# Patient Record
Sex: Female | Born: 1940 | Race: White | Hispanic: No | Smoking: Former smoker
Health system: Southern US, Community
[De-identification: ages and names within clinical notes are randomized; demographics above are authoritative.]

## PROBLEM LIST (undated history)

## (undated) DIAGNOSIS — M545 Other chronic pain: Secondary | ICD-10-CM

## (undated) DIAGNOSIS — E039 Hypothyroidism, unspecified: Secondary | ICD-10-CM

## (undated) DIAGNOSIS — M479 Spondylosis, unspecified: Secondary | ICD-10-CM

## (undated) DIAGNOSIS — M5090 Cervical disc disorder, unspecified, unspecified cervical region: Secondary | ICD-10-CM

## (undated) DIAGNOSIS — I2510 Atherosclerotic heart disease of native coronary artery without angina pectoris: Secondary | ICD-10-CM

## (undated) DIAGNOSIS — Z8673 Personal history of transient ischemic attack (TIA), and cerebral infarction without residual deficits: Secondary | ICD-10-CM

## (undated) DIAGNOSIS — E785 Hyperlipidemia, unspecified: Secondary | ICD-10-CM

## (undated) DIAGNOSIS — I739 Peripheral vascular disease, unspecified: Secondary | ICD-10-CM

## (undated) DIAGNOSIS — M47815 Spondylosis without myelopathy or radiculopathy, thoracolumbar region: Secondary | ICD-10-CM

## (undated) DIAGNOSIS — J449 Chronic obstructive pulmonary disease, unspecified: Secondary | ICD-10-CM

## (undated) DIAGNOSIS — N189 Chronic kidney disease, unspecified: Secondary | ICD-10-CM

## (undated) DIAGNOSIS — Z951 Presence of aortocoronary bypass graft: Secondary | ICD-10-CM

## (undated) DIAGNOSIS — I10 Essential (primary) hypertension: Secondary | ICD-10-CM

## (undated) DIAGNOSIS — I272 Pulmonary hypertension, unspecified: Secondary | ICD-10-CM

## (undated) DIAGNOSIS — G459 Transient cerebral ischemic attack, unspecified: Secondary | ICD-10-CM

## (undated) DIAGNOSIS — G8929 Other chronic pain: Secondary | ICD-10-CM

## (undated) DIAGNOSIS — I255 Ischemic cardiomyopathy: Secondary | ICD-10-CM

## (undated) DIAGNOSIS — Z8701 Personal history of pneumonia (recurrent): Secondary | ICD-10-CM

## (undated) DIAGNOSIS — I639 Cerebral infarction, unspecified: Secondary | ICD-10-CM

## (undated) DIAGNOSIS — F419 Anxiety disorder, unspecified: Secondary | ICD-10-CM

## (undated) DIAGNOSIS — I4819 Other persistent atrial fibrillation: Secondary | ICD-10-CM

## (undated) HISTORY — DX: Atherosclerotic heart disease of native coronary artery without angina pectoris: I25.10

## (undated) HISTORY — DX: Personal history of pneumonia (recurrent): Z87.01

## (undated) HISTORY — DX: Spondylosis without myelopathy or radiculopathy, thoracolumbar region: M47.815

## (undated) HISTORY — DX: Hyperlipidemia, unspecified: E78.5

## (undated) HISTORY — DX: Cervical disc disorder, unspecified, unspecified cervical region: M50.90

## (undated) HISTORY — DX: Presence of aortocoronary bypass graft: Z95.1

## (undated) HISTORY — DX: Ischemic cardiomyopathy: I25.5

## (undated) HISTORY — DX: Other persistent atrial fibrillation: I48.19

## (undated) HISTORY — DX: Peripheral vascular disease, unspecified: I73.9

## (undated) HISTORY — DX: Pulmonary hypertension, unspecified: I27.20

## (undated) HISTORY — DX: Hypothyroidism, unspecified: E03.9

## (undated) HISTORY — DX: Essential (primary) hypertension: I10

## (undated) HISTORY — DX: Personal history of transient ischemic attack (TIA), and cerebral infarction without residual deficits: Z86.73

## (undated) HISTORY — DX: Cerebral infarction, unspecified: I63.9

## (undated) HISTORY — DX: Low back pain: M54.5

## (undated) HISTORY — PX: ABDOMINAL HYSTERECTOMY: SHX81

## (undated) HISTORY — DX: Spondylosis, unspecified: M47.9

## (undated) HISTORY — DX: Other chronic pain: M54.50

## (undated) HISTORY — DX: Transient cerebral ischemic attack, unspecified: G45.9

## (undated) HISTORY — DX: Chronic obstructive pulmonary disease, unspecified: J44.9

## (undated) HISTORY — DX: Other chronic pain: G89.29

---

## 2003-05-28 ENCOUNTER — Encounter: Admission: RE | Admit: 2003-05-28 | Discharge: 2003-05-28 | Payer: Self-pay | Admitting: Internal Medicine

## 2003-07-02 ENCOUNTER — Encounter: Admission: RE | Admit: 2003-07-02 | Discharge: 2003-07-02 | Payer: Self-pay | Admitting: Internal Medicine

## 2003-08-28 ENCOUNTER — Encounter: Admission: RE | Admit: 2003-08-28 | Discharge: 2003-08-28 | Payer: Self-pay | Admitting: Internal Medicine

## 2004-01-20 ENCOUNTER — Ambulatory Visit: Payer: Self-pay | Admitting: Internal Medicine

## 2004-01-28 ENCOUNTER — Ambulatory Visit: Payer: Self-pay | Admitting: Internal Medicine

## 2004-02-04 ENCOUNTER — Ambulatory Visit: Payer: Self-pay | Admitting: Internal Medicine

## 2004-02-22 ENCOUNTER — Ambulatory Visit: Payer: Self-pay | Admitting: Internal Medicine

## 2004-03-07 ENCOUNTER — Ambulatory Visit: Payer: Self-pay | Admitting: Internal Medicine

## 2004-07-21 ENCOUNTER — Ambulatory Visit: Payer: Self-pay | Admitting: Internal Medicine

## 2004-08-01 ENCOUNTER — Ambulatory Visit: Payer: Self-pay | Admitting: Internal Medicine

## 2004-10-17 ENCOUNTER — Ambulatory Visit: Payer: Self-pay | Admitting: Internal Medicine

## 2004-10-24 ENCOUNTER — Ambulatory Visit: Payer: Self-pay | Admitting: Internal Medicine

## 2005-03-21 ENCOUNTER — Ambulatory Visit (HOSPITAL_COMMUNITY): Admission: RE | Admit: 2005-03-21 | Discharge: 2005-03-21 | Payer: Self-pay | Admitting: Cardiovascular Disease

## 2005-04-03 DIAGNOSIS — I739 Peripheral vascular disease, unspecified: Secondary | ICD-10-CM

## 2005-04-03 HISTORY — DX: Peripheral vascular disease, unspecified: I73.9

## 2005-04-06 ENCOUNTER — Ambulatory Visit (HOSPITAL_COMMUNITY): Admission: RE | Admit: 2005-04-06 | Discharge: 2005-04-07 | Payer: Self-pay | Admitting: Cardiovascular Disease

## 2005-04-06 HISTORY — PX: ILIAC ARTERY STENT: SHX1786

## 2007-04-04 DIAGNOSIS — I2510 Atherosclerotic heart disease of native coronary artery without angina pectoris: Secondary | ICD-10-CM

## 2007-04-04 DIAGNOSIS — Z951 Presence of aortocoronary bypass graft: Secondary | ICD-10-CM

## 2007-04-04 HISTORY — PX: CORONARY ARTERY BYPASS GRAFT: SHX141

## 2007-04-04 HISTORY — PX: MAZE: SHX5063

## 2007-04-04 HISTORY — DX: Atherosclerotic heart disease of native coronary artery without angina pectoris: I25.10

## 2007-04-04 HISTORY — DX: Presence of aortocoronary bypass graft: Z95.1

## 2007-07-27 ENCOUNTER — Inpatient Hospital Stay (HOSPITAL_COMMUNITY): Admission: EM | Admit: 2007-07-27 | Discharge: 2007-08-11 | Payer: Self-pay | Admitting: Emergency Medicine

## 2007-07-30 ENCOUNTER — Encounter: Payer: Self-pay | Admitting: Cardiothoracic Surgery

## 2007-07-30 ENCOUNTER — Encounter (INDEPENDENT_AMBULATORY_CARE_PROVIDER_SITE_OTHER): Payer: Self-pay | Admitting: *Deleted

## 2007-07-30 ENCOUNTER — Ambulatory Visit: Payer: Self-pay | Admitting: Cardiothoracic Surgery

## 2007-07-30 HISTORY — PX: CARDIAC CATHETERIZATION: SHX172

## 2007-09-09 ENCOUNTER — Ambulatory Visit: Payer: Self-pay | Admitting: Cardiothoracic Surgery

## 2007-09-09 ENCOUNTER — Encounter
Admission: RE | Admit: 2007-09-09 | Discharge: 2007-09-09 | Payer: Self-pay | Admitting: Thoracic Surgery (Cardiothoracic Vascular Surgery)

## 2007-10-10 ENCOUNTER — Encounter: Admission: RE | Admit: 2007-10-10 | Discharge: 2007-10-10 | Payer: Self-pay | Admitting: Cardiothoracic Surgery

## 2007-10-10 ENCOUNTER — Ambulatory Visit: Payer: Self-pay | Admitting: Cardiothoracic Surgery

## 2010-03-25 ENCOUNTER — Inpatient Hospital Stay (HOSPITAL_COMMUNITY)
Admission: EM | Admit: 2010-03-25 | Discharge: 2010-03-26 | Payer: Self-pay | Source: Home / Self Care | Attending: Cardiovascular Disease | Admitting: Cardiovascular Disease

## 2010-04-03 DIAGNOSIS — I255 Ischemic cardiomyopathy: Secondary | ICD-10-CM

## 2010-04-03 HISTORY — DX: Ischemic cardiomyopathy: I25.5

## 2010-04-03 HISTORY — PX: TRANSTHORACIC ECHOCARDIOGRAM: SHX275

## 2010-04-16 ENCOUNTER — Inpatient Hospital Stay (HOSPITAL_COMMUNITY)
Admission: EM | Admit: 2010-04-16 | Discharge: 2010-04-23 | Payer: Self-pay | Source: Home / Self Care | Attending: Cardiovascular Disease | Admitting: Cardiovascular Disease

## 2010-04-17 ENCOUNTER — Encounter (INDEPENDENT_AMBULATORY_CARE_PROVIDER_SITE_OTHER): Payer: Self-pay | Admitting: Cardiovascular Disease

## 2010-04-18 LAB — URINE MICROSCOPIC-ADD ON

## 2010-04-18 LAB — CK TOTAL AND CKMB (NOT AT ARMC)
CK, MB: 1.5 ng/mL (ref 0.3–4.0)
Relative Index: INVALID (ref 0.0–2.5)
Total CK: 40 U/L (ref 7–177)

## 2010-04-18 LAB — URINALYSIS, ROUTINE W REFLEX MICROSCOPIC
Bilirubin Urine: NEGATIVE
Hgb urine dipstick: NEGATIVE
Ketones, ur: NEGATIVE mg/dL
Leukocytes, UA: NEGATIVE
Nitrite: NEGATIVE
Protein, ur: 100 mg/dL — AB
Specific Gravity, Urine: 1.013 (ref 1.005–1.030)
Urine Glucose, Fasting: NEGATIVE mg/dL
Urobilinogen, UA: 1 mg/dL (ref 0.0–1.0)
pH: 6.5 (ref 5.0–8.0)

## 2010-04-18 LAB — CBC
HCT: 40.7 % (ref 36.0–46.0)
HCT: 34.7 % — ABNORMAL LOW (ref 36.0–46.0)
Hemoglobin: 12.8 g/dL (ref 12.0–15.0)
Hemoglobin: 10.8 g/dL — ABNORMAL LOW (ref 12.0–15.0)
MCH: 28.1 pg (ref 26.0–34.0)
MCH: 28.3 pg (ref 26.0–34.0)
MCHC: 31.4 g/dL (ref 30.0–36.0)
MCHC: 31.1 g/dL (ref 30.0–36.0)
MCV: 89.5 fL (ref 78.0–100.0)
MCV: 90.8 fL (ref 78.0–100.0)
Platelets: 246 10*3/uL (ref 150–400)
Platelets: 170 10*3/uL (ref 150–400)
RBC: 4.55 MIL/uL (ref 3.87–5.11)
RBC: 3.82 MIL/uL — ABNORMAL LOW (ref 3.87–5.11)
RDW: 17.3 % — ABNORMAL HIGH (ref 11.5–15.5)
RDW: 17.5 % — ABNORMAL HIGH (ref 11.5–15.5)
WBC: 6.1 10*3/uL (ref 4.0–10.5)
WBC: 4.3 10*3/uL (ref 4.0–10.5)

## 2010-04-18 LAB — BASIC METABOLIC PANEL
BUN: 11 mg/dL (ref 6–23)
CO2: 26 mEq/L (ref 19–32)
Calcium: 7.5 mg/dL — ABNORMAL LOW (ref 8.4–10.5)
Chloride: 104 mEq/L (ref 96–112)
Creatinine, Ser: 0.81 mg/dL (ref 0.4–1.2)
GFR calc Af Amer: >60 mL/min (ref >60)
GFR calc non Af Amer: >60 mL/min (ref >60)
Glucose, Bld: 128 mg/dL — ABNORMAL HIGH (ref 70–99)
Potassium: 4 mEq/L (ref 3.5–5.1)
Sodium: 138 mEq/L (ref 135–145)

## 2010-04-18 LAB — DIFFERENTIAL
Basophils Absolute: 0 10*3/uL (ref 0.0–0.1)
Basophils Relative: 0 % (ref 0–1)
Eosinophils Absolute: 0 10*3/uL (ref 0.0–0.7)
Eosinophils Relative: 0 % (ref 0–5)
Lymphocytes Relative: 12 % (ref 12–46)
Lymphs Abs: 0.7 10*3/uL (ref 0.7–4.0)
Monocytes Absolute: 0.6 10*3/uL (ref 0.1–1.0)
Monocytes Relative: 10 % (ref 3–12)
Neutro Abs: 4.7 10*3/uL (ref 1.7–7.7)
Neutrophils Relative %: 78 % — ABNORMAL HIGH (ref 43–77)

## 2010-04-18 LAB — COMPREHENSIVE METABOLIC PANEL
ALT: 26 U/L (ref 0–35)
AST: 46 U/L — ABNORMAL HIGH (ref 0–37)
Albumin: 3.9 g/dL (ref 3.5–5.2)
Alkaline Phosphatase: 75 U/L (ref 39–117)
BUN: 14 mg/dL (ref 6–23)
CO2: 29 mEq/L (ref 19–32)
Calcium: 8.6 mg/dL (ref 8.4–10.5)
Chloride: 94 mEq/L — ABNORMAL LOW (ref 96–112)
Creatinine, Ser: 0.97 mg/dL (ref 0.4–1.2)
GFR calc Af Amer: >60 mL/min (ref >60)
GFR calc non Af Amer: 57 mL/min — ABNORMAL LOW (ref >60)
Glucose, Bld: 219 mg/dL — ABNORMAL HIGH (ref 70–99)
Potassium: 4.6 mEq/L (ref 3.5–5.1)
Sodium: 134 mEq/L — ABNORMAL LOW (ref 135–145)
Total Bilirubin: 1.6 mg/dL — ABNORMAL HIGH (ref 0.3–1.2)
Total Protein: 7.1 g/dL (ref 6.0–8.3)

## 2010-04-18 LAB — TROPONIN I: Troponin I: <0.01 ng/mL (ref 0.00–0.06)

## 2010-04-18 LAB — APTT: aPTT: 74 seconds — ABNORMAL HIGH (ref 24–37)

## 2010-04-18 LAB — BRAIN NATRIURETIC PEPTIDE: Pro B Natriuretic peptide (BNP): 388 pg/mL — ABNORMAL HIGH (ref 0.0–100.0)

## 2010-04-18 LAB — PROTIME-INR
INR: 1.88 — ABNORMAL HIGH (ref 0.00–1.49)
Prothrombin Time: 21.8 seconds — ABNORMAL HIGH (ref 11.6–15.2)

## 2010-04-18 LAB — RAPID STREP SCREEN (MED CTR MEBANE ONLY): Streptococcus, Group A Screen (Direct): NEGATIVE

## 2010-04-18 LAB — TSH: TSH: 1.289 u[IU]/mL (ref 0.350–4.500)

## 2010-04-18 LAB — MRSA PCR SCREENING: MRSA by PCR: NEGATIVE

## 2010-04-20 LAB — BASIC METABOLIC PANEL
BUN: 8 mg/dL (ref 6–23)
BUN: 5 mg/dL — ABNORMAL LOW (ref 6–23)
CO2: 25 mEq/L (ref 19–32)
CO2: 33 mEq/L — ABNORMAL HIGH (ref 19–32)
Calcium: 7.5 mg/dL — ABNORMAL LOW (ref 8.4–10.5)
Calcium: 7.7 mg/dL — ABNORMAL LOW (ref 8.4–10.5)
Chloride: 100 mEq/L (ref 96–112)
Chloride: 96 mEq/L (ref 96–112)
Creatinine, Ser: 0.6 mg/dL (ref 0.4–1.2)
Creatinine, Ser: 0.74 mg/dL (ref 0.4–1.2)
GFR calc Af Amer: >60 mL/min (ref >60)
GFR calc Af Amer: >60 mL/min (ref >60)
GFR calc non Af Amer: >60 mL/min (ref >60)
GFR calc non Af Amer: >60 mL/min (ref >60)
Glucose, Bld: 106 mg/dL — ABNORMAL HIGH (ref 70–99)
Glucose, Bld: 120 mg/dL — ABNORMAL HIGH (ref 70–99)
Potassium: 3.8 mEq/L (ref 3.5–5.1)
Potassium: 3 mEq/L — ABNORMAL LOW (ref 3.5–5.1)
Sodium: 134 mEq/L — ABNORMAL LOW (ref 135–145)
Sodium: 137 mEq/L (ref 135–145)

## 2010-04-20 LAB — CBC
HCT: 34.3 % — ABNORMAL LOW (ref 36.0–46.0)
HCT: 33.7 % — ABNORMAL LOW (ref 36.0–46.0)
Hemoglobin: 10.6 g/dL — ABNORMAL LOW (ref 12.0–15.0)
Hemoglobin: 10.8 g/dL — ABNORMAL LOW (ref 12.0–15.0)
MCH: 28 pg (ref 26.0–34.0)
MCH: 28.5 pg (ref 26.0–34.0)
MCHC: 30.9 g/dL (ref 30.0–36.0)
MCHC: 32 g/dL (ref 30.0–36.0)
MCV: 90.5 fL (ref 78.0–100.0)
MCV: 88.9 fL (ref 78.0–100.0)
Platelets: 157 10*3/uL (ref 150–400)
Platelets: 172 10*3/uL (ref 150–400)
RBC: 3.79 MIL/uL — ABNORMAL LOW (ref 3.87–5.11)
RBC: 3.79 MIL/uL — ABNORMAL LOW (ref 3.87–5.11)
RDW: 17.3 % — ABNORMAL HIGH (ref 11.5–15.5)
RDW: 16.7 % — ABNORMAL HIGH (ref 11.5–15.5)
WBC: 4 10*3/uL (ref 4.0–10.5)
WBC: 4.8 10*3/uL (ref 4.0–10.5)

## 2010-04-20 LAB — MAGNESIUM: Magnesium: 2.1 mg/dL (ref 1.5–2.5)

## 2010-04-20 LAB — HEMOCCULT GUIAC POC 1CARD (OFFICE): Fecal Occult Bld: POSITIVE

## 2010-04-20 LAB — CLOSTRIDIUM DIFFICILE BY PCR: Toxigenic C. Difficile by PCR: NEGATIVE

## 2010-04-20 LAB — BRAIN NATRIURETIC PEPTIDE: Pro B Natriuretic peptide (BNP): 390 pg/mL — ABNORMAL HIGH (ref 0.0–100.0)

## 2010-04-24 ENCOUNTER — Encounter: Payer: Self-pay | Admitting: Cardiothoracic Surgery

## 2010-04-25 LAB — BASIC METABOLIC PANEL
Creatinine, Ser: 0.83 mg/dL (ref 0.4–1.2)
Potassium: 3.7 mEq/L (ref 3.5–5.1)

## 2010-04-25 LAB — CBC
HCT: 36.8 % (ref 36.0–46.0)
MCH: 28 pg (ref 26.0–34.0)

## 2010-04-25 LAB — TSH: TSH: 2.984 u[IU]/mL (ref 0.350–4.500)

## 2010-04-28 NOTE — Discharge Summary (Addendum)
NAMEMICKI, CASSEL NO.:  [id number]  MEDICAL RECORD NO.:  [id number]          PATIENT TYPE:  INP  LOCATION:  2922                         FACILITY:  [facility]  PHYSICIAN:  Landry Corporal, MD DATE OF BIRTH:  01-04-1941  DATE OF ADMISSION:  04/16/2010 DATE OF DISCHARGE:  04/23/2010                              DISCHARGE SUMMARY   DISCHARGE DIAGNOSES: 1. Atrial fibrillation with rapid ventricular response, rate     controlled at discharge. 2. Pneumonia.  Today is the last day of her antibiotic treatment. 3. Hypertension, currently stable. 4. Coronary artery disease. 5. Negative CT angiogram of chest for pulmonary embolism.  HOSPITAL COURSE:  [name] is a 70 year old female with a history of coronary artery disease status post bypass grafting x2 in May 2009 with Maze procedure.  She was hospitalized just before Christmas with atrial fibrillation.  She was sent home on Pradaxa and rate control.  She came in this admission for a complaint of weakness.  She has been treated for 2 weeks prior to the admission for an outpatient pneumonia with some antibiotics.  She had a 6-day course of those antibiotics which apparently did not help.  In the emergency room, her heart rate was in the 110-140 range.  She was hypotensive with blood pressure in the 70s that was after almost a liter of normal saline.  The patient was admitted.  She was continued on fluid replacement.  A 2-D echocardiogram was ordered.  She was started on IV amiodarone for rate control.  Her initial cardiac enzymes were negative.  Chest x-ray showed hyperexpansion and chronic changes.  EKG confirmed atrial fibrillation with rapid ventricular response.  She was on Pradaxa for anticoagulation with low suspicion for sepsis or cardiogenic shock.  On January 15, the patient had slightly improved appetite, still coughing and wheezing, blood pressures improved, and her atrial fibrillation was close to  being rate controlled at about 104.  She was treated empirically for pneumonia with Levaquin 500 mg p.o. daily.  There was consideration made for cardioversion and at this current time, we are at this point in her stay, her IV fluids were decreased, her amiodarone was increased, and she was added on digoxin.  A 2-D echocardiogram was completed showed an ejection fraction of 40-45%.  On January 16, the patient was dyspneic and wheezing sounding wet.  Our main concerns were pulmonary embolism. The patient was subsequently sent for a CT angiogram of chest to rule out PE.  On January 17, the patient continued to complain of weakness stating she did not feel well.  The patient had been previously started on Rocephin also and it was discontinued on January 17.  On January 18, the patient was slow in atrial fibrillation, rate was much better.  She was 89 beats per minute and she was stating she feels better as well. BNP was slightly increased at 402.  She was fecal occult blood positive and C. diff negative.  Incentive spirometry was ordered to improve her pulmonary function.  The patient's heart rate seemed to continue to fluctuate.  On January 19, it was  115 beats per minute.  CT angiogram of chest was completed on January 19 and was negative for pulmonary embolism, was positive for cardiomegaly and small bilateral pleural effusions.  There was moderate central lobular emphysema with patchy lower lobe predominant volume loss/atelectasis.  On January 20, the patient was doing very well, using her incentive spirometry, rate was controlled down to 78, good blood pressure in the 90s systolic.  She is to continue on the amiodarone 400 mg daily for 1 week and then switched to 200 mg.  She had been seen by Dr. Herbie Baltimore on the day of discharge, feels she is stable for discharge home.  She was ambulated in the hall prior to this decision to make sure heart rate stayed stable.  LABORATORY DATA:  CBC:   WBCs were 6.7, hemoglobin 11.7, hematocrit 36.8, and platelets 200.  Sodium 138, potassium 3.7, chloride 91, CO2 of 36, BUN 3, creatinine 0.3, and calcium 8.2.  BNP was 402.  Thyroid stimulating hormone was 2.984.  STUDIES/PROCEDURES: 1. Chest x-ray, the latest one was January 16 showed new small     bilateral pleural effusions, new mild interstitial pulmonary edema     as bases. 2. CT angiogram on January 19, impression was no evidence of pulmonary     embolism.  Cardiomegaly and small bilateral pleural effusions     present.  Question congestive heart failure.  There is moderate     central lobular emphysema with patchy lower lobe predominant volume     loss/atelectasis.  Focal right middle lobe atelectasis with     slightly progressive from July 27, 2007, without underlying mass     or endobrachial obstruction. 3. Echocardiogram, January 15, study conclusions:  Left ventricle     systolic function was mildly to moderately reduced.  Estimated EF     in the range of 40-45%.  Diffuse hypokinesis.  Atrial fibrillation     precludes evaluation of LV diastolic dysfunction.  Mitral valve     showed mild regurgitation.  Left atrium was mildly dilated.  Right     ventricle cavity size was mildly dilated.  Systolic function was     mildly reduced.  Right atrium was mildly dilated.  Atrial septum     had no defect or patent foramen ovale identified.  Tricuspid valves     revealed moderate regurgitation.  Pulmonary artery systolic peak PA     pressure was 48 mmHg.  DISCHARGE MEDICATIONS: 1. Amiodarone 200 mg tablets 2 tablets once a day for the first 7 days     on discharge and then subsequently changing to 1 tablet a day from     thereon after. 2. Digoxin 0.125 mg tablets 1 tablet by mouth daily. 3. Diltiazem 120 mg CD ER 1 tablet by mouth every 12 hours.  The     patient is to alternate this or to separate taking the metoprolol     with the diltiazem by a period of 2 hours.  If her heart  rate is     less than 60 beats per minute, she is not to take her diltiazem.     This was explained to the patient. 4. Nitroglycerin sublingual 0.4 mg 1 tablet by mouth every 5 minutes     as needed for chest pain, not up to take more than 3 doses. 5. Pantoprazole 40 mg enteric-coated tablets 1 tablet by mouth daily. 6. Alprazolam 0.5 mg 1 tablet by mouth daily at bedtime. 7. Aspirin 81 mg  1 tablet by mouth daily. 8. Dabigatran 150 mg capsules 1 capsule by mouth twice daily. 9. Lasix 40 mg 1 tablet by mouth daily. 10.Lipitor 80 mg 1 tablet by mouth daily at bedtime. 11.Lisinopril 2.5 mg 1 tablet by mouth daily. 12.Metoprolol 25 mg tablets 2 tablets by mouth twice a day, she takes     this med at 8 o'clock in the morning and 8 p.m. which means she     will be taking the diltiazem 2 hours before or 2 hours after. 13 Potassium chloride 20 mEq 1 tablet by mouth daily at bedtime.  DISPOSITION:  [name] will be discharged home in stable condition.  She will be increasing her activity slowly.  She can shower and bathe and all those fine activities.  She is to eat a low-sodium, heart-healthy diet.  She will follow with Dr. Allyson Sabal in approximately 2-3 weeks.  Our office will call her with the appointment time.  ______________________________ Wilburt Finlay, PA  I saw and examined the patient on the day of discharge.  She was stable for discharge.  I agree with Mr. Jasper Riling summary of events.  ______________________________ Landry Corporal, MD    BH/MEDQ  D:  04/23/2010  T:  04/24/2010  Job:  [id number]  cc:   Nanetta Batty, M.D.  Electronically Signed by Wilburt Finlay PA on 04/26/2010 12:26:10 PM Electronically Signed by Bryan Lemma MD on 04/28/2010 11:25:45 PM

## 2010-05-13 NOTE — H&P (Signed)
NAMECECELIA, [name]                 ACCOUNT NO.:  [id number]  MEDICAL RECORD NO.:  [id number]          PATIENT TYPE:  EMS  LOCATION:  MAJO                         FACILITY:  [facility]  PHYSICIAN:  Dr. Allyson Sabal              DATE OF BIRTH:  04-Dec-1940  DATE OF ADMISSION:  04/16/2010 DATE OF DISCHARGE:                             HISTORY & PHYSICAL   CHIEF COMPLAINT:  Weakness.  HISTORY OF PRESENT ILLNESS:  [name] is a 70 year old female, who had bypass grafting x2 in May 2009 with a Maze procedure.  She was just hospitalized over Christmas with atrial fibrillation.  She was sent home on Pradaxa and rate control.  I see no mention of a recent ejection fraction, it is possible she had an outpatient echocardiogram recently. She is admitted now through the emergency room with weakness.  She says about 2 weeks ago, she was treated with antibiotics for "outpatient pneumonia."  She had a 6-day course, which apparently did not help and the daughter called the office earlier this week and she was put on another course of antibiotics.  In the emergency room, the patient is in atrial fibrillation with a rate of 110-140.  She is hypotensive with a blood pressure in the 70s; despite almost a liter of normal saline.  She denies any shortness of breath.  MEDICATIONS:  Her home medications are; 1. Cartia 120 mg a day. 2. Pradaxa 150 mg b.i.d. 3. Potassium 20 mEq a day. 4. Metoprolol 75 mg b.i.d. 5. Lipitor 80 mg a day. 6. Ceftin 500 mg b.i.d. 7. Lasix 40 mg a day. 8. Lisinopril 2.5 mg a day.  ALLERGIES:  She has no known drug allergies.  PAST MEDICAL HISTORY:  Remarkable for; 1. Vascular disease, she has had previous left iliac and superficial     femoral artery PTAs. 2. She has history of hypertension and dyslipidemia.  SOCIAL HISTORY:  She lives alone; although her daughters in the area. She is a remote smoker.  FAMILY HISTORY:  Unremarkable.  REVIEW OF SYSTEMS:  Essentially  unremarkable; except for noted above.  PHYSICAL EXAMINATION:  VITAL SIGNS:  Blood pressure currently is 74/50, heart rate is 120, and O2 sats 96%. GENERAL:  She is a chronically ill-appearing, frail 70 year old female, in no acute distress. HEENT:  Normocephalic.  She wears glasses. NECK:  Without JVD or bruit. CHEST:  Diminished breath sounds at the left base, but otherwise clear lung fields. CARDIAC:  Diminished heart sounds with irregularly irregular rhythm.  No obvious murmur or rub. ABDOMEN:  Nondistended and nontender. EXTREMITIES:  Without edema. NEUROLOGIC:  Grossly intact.  She is awake, alert, oriented, and cooperative.  Moves all extremities without obvious deficit. SKIN:  Cool and dry.  LABORATORY DATA:  Sodium 134, potassium 4.6, BUN 14, and creatinine 0.97.  White count 6.1, hemoglobin 12.8, hematocrit 40.7, and platelets 246.  Troponin is negative.  Chest x-ray shows hyperexpansion and chronic changes.  EKG shows atrial fibrillation with increased ventricular response and narrow QRS.  IMPRESSION: 1. Atrial fibrillation with rapid ventricular response, this does not  appear to be fast enough to account for her hypotension. 2. Hypotension, possibly secondary to combination of pneumonia and     atrial fibrillation, her labs did not indicate that she is     significantly dehydrated. 3. Questionable outpatient pneumonia, on antibiotics. 4. Atrial fibrillation, which has been recurrent, she was admitted     over Christmas for this. 5. Coronary artery disease with coronary artery bypass grafting x2     with a Maze procedure in May 2009. 6. Vascular disease with previous left iliac and bifemoral PTA. 7. History of treated hypertension. 8. Treated dyslipidemia.  PLAN:  The patient will continue to get fluids.  She will need an echocardiogram or some assessment of her LV function, we will try and see if there is a echo from the office in the recent records.  She  does not appear to be septic, she is not febrile and her white count is 6.1, we will continue her p.o. antibiotics for now.  We will try IV amiodarone for rate control.     Abelino Derrick, P.A.   ______________________________ Dr. Evalee Mutton  D:  04/16/2010  T:  04/16/2010  Job:  [id number]  cc:   Tammy R. Collins Scotland, M.D.  Electronically Signed by Corine Shelter P.A. on 04/17/2010 11:14:05 AM Electronically Signed by Nanetta Batty M.D. on 05/13/2010 08:07:59 AM

## 2010-06-13 LAB — CBC
HCT: 39.4 % (ref 36.0–46.0)
Hemoglobin: 12.7 g/dL (ref 12.0–15.0)
Hemoglobin: 13.5 g/dL (ref 12.0–15.0)
MCH: 29.5 pg (ref 26.0–34.0)
MCH: 29.8 pg (ref 26.0–34.0)
MCHC: 32.2 g/dL (ref 30.0–36.0)
MCV: 90.5 fL (ref 78.0–100.0)
Platelets: 216 10*3/uL (ref 150–400)
RDW: 15.8 % — ABNORMAL HIGH (ref 11.5–15.5)
RDW: 15.8 % — ABNORMAL HIGH (ref 11.5–15.5)
WBC: 8.7 10*3/uL (ref 4.0–10.5)

## 2010-06-13 LAB — CK TOTAL AND CKMB (NOT AT ARMC)
CK, MB: 1.2 ng/mL (ref 0.3–4.0)
Total CK: 58 U/L (ref 7–177)

## 2010-06-13 LAB — POCT CARDIAC MARKERS: Troponin i, poc: <0.05 ng/mL (ref 0.00–0.09)

## 2010-06-13 LAB — POCT I-STAT, CHEM 8
BUN: 18 mg/dL (ref 6–23)
Calcium, Ion: 1.03 mmol/L — ABNORMAL LOW (ref 1.12–1.32)
Chloride: 102 mEq/L (ref 96–112)
HCT: 41 % (ref 36.0–46.0)
Sodium: 139 mEq/L (ref 135–145)
TCO2: 28 mmol/L (ref 0–100)

## 2010-06-13 LAB — TROPONIN I: Troponin I: 0.06 ng/mL (ref 0.00–0.06)

## 2010-06-13 LAB — COMPREHENSIVE METABOLIC PANEL
AST: 32 U/L (ref 0–37)
Alkaline Phosphatase: 69 U/L (ref 39–117)
BUN: 12 mg/dL (ref 6–23)
CO2: 28 mEq/L (ref 19–32)
Chloride: 106 mEq/L (ref 96–112)
Creatinine, Ser: 0.67 mg/dL (ref 0.4–1.2)
GFR calc non Af Amer: >60 mL/min (ref >60)
Potassium: 3.4 mEq/L — ABNORMAL LOW (ref 3.5–5.1)
Total Bilirubin: 1.2 mg/dL (ref 0.3–1.2)

## 2010-06-13 LAB — BRAIN NATRIURETIC PEPTIDE: Pro B Natriuretic peptide (BNP): 439 pg/mL — ABNORMAL HIGH (ref 0.0–100.0)

## 2010-06-13 LAB — TSH: TSH: 3.047 u[IU]/mL (ref 0.350–4.500)

## 2010-06-13 LAB — PROTIME-INR: INR: 1.43 (ref 0.00–1.49)

## 2010-06-13 LAB — CARDIAC PANEL(CRET KIN+CKTOT+MB+TROPI): Troponin I: 0.04 ng/mL (ref 0.00–0.06)

## 2010-08-16 NOTE — Assessment & Plan Note (Signed)
OFFICE VISIT   [name], [name]  DOB:  23-Dec-1940                                        October 10, 2007  CHART #:  [id number]   The patient returns to the office today in followup after her discharge  from the hospital on Aug 11, 2007.  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  Early after discharge, she had confusion about the  management of her Coumadin and ended up with INR of over 7.  She is now  being managed by the [facility] and remains on  Coumadin 2.5 on Mondays and Thursdays and 5 mg the rest of the week.  Overall, she is making good progress.  She has not noted any episodes of  rapid heart rate that was her original presenting symptom at the time of  her admission.   On exam, her blood pressure is 115/70, pulse is 76 and regular with a  respiratory rate of 18, and O2 saturations 93%.  Her sternum is stable  and well healed.  Bowel sounds are crisp.  I do not appreciate any  mitral regurgitation.  She has no pedal edema.  The right endovein  harvest site is well healed.   Followup chest x-ray shows decreasing left effusion and improved  aeration on the left.  A rhythm strip shows sinus rhythm.   She is to have a stress test done in Dr. Hazle Coca office today.  She is  now about 2 months postop, and at least today appears to be in sinus  rhythm.  I have asked to discuss with Dr. Gery Pray stopping  her Coumadin in the near future if she remains in sinus rhythm.  I have  not made a return appointment to see me, but would be glad to see her at  her or Dr. Hazle Coca request at anytime.   Sheliah Plane, MD  Electronically Signed   EG/MEDQ  D:   10/10/2007  T:  10/10/2007  Job:  [id number]   cc:   Nanetta Batty, M.D.  Tammy R. Collins Scotland, M.D.

## 2010-08-16 NOTE — Assessment & Plan Note (Signed)
OFFICE VISIT   [name], [name]  DOB:  1940-05-13                                        September 09, 2007  CHART #:  [id number]   HISTORY:  [name] returns for an office visit on September 09, 2007,  following her coronary artery bypass grafting x2 and modified left-sided  maze procedure as well as ligation of the left atrial appendage and  ligation of an arterial venous fistula from the left coronary system to  the coronary sinus.  This procedure was performed on Aug 05, 2007, by Dr.  Tyrone Sage.  She reports on September 09, 2007, that she overall feels as though  she is making good progress.  She does describe some dyspnea on  exertion.  She denies fevers, chills, or other constitutional symptoms.  She is not having significant difficulty with chest pain.   CHEST X-RAY:  A chest x-ray was evaluated on September 09, 2007.  There is a  small-to-moderate sized left-sided effusion, which is very stable in  appearance compared to a previous film dated Aug 11, 2007.  The right-  sided effusion has improved.  Her sternal wires are intact.  There is no  evidence of other infiltrates or congestive failure.  There is some  atelectasis in the left lower base.   PHYSICAL EXAMINATION:  Vital Signs:  Blood pressure 112/66, pulse of 86,  and respirations 18.  Note:  A rhythm strip was done during this visit  and reveals a normal sinus rhythm.  General Appearance:  A well-  developed adult female in no acute distress.  Pulmonary Examination:  Reveals diminished breath sounds in the left base.  Otherwise, clear.  Cardiac Examination:  Reveals a regular rate and rhythm.  Normal S1 and  S2 without gallops, rubs, or murmurs.  Abdominal Examination:  Soft and  nontender.  Extremity Examination:  Reveals some mild edema right  greater than left lower extremity.  Incisions are all healed without  evidence of infection.   ASSESSMENT:  [name] is doing well.  I placed her on Lasix 40 mg daily  with  potassium chloride 20 mEq      daily for 1 month, at which time we  will repeat a chest x-ray and see her in  the office.  She is encouraged to continue her ongoing follow up with  her primary physician and cardiologist.   Rowe Clack, P.A.-C.   Sherryll Burger  D:  09/09/2007  T:  09/10/2007  Job:  [id number]   cc:   [name], M.D.

## 2010-08-16 NOTE — Cardiovascular Report (Signed)
Brenda, Glass NO.:  [id number]   MEDICAL RECORD NO.:  [id number]           PATIENT TYPE:   LOCATION:                                 FACILITY:   PHYSICIAN:  [name], M.D.DATE OF BIRTH:  07-Dec-1940   DATE OF PROCEDURE:  07/30/2007  DATE OF DISCHARGE:                            CARDIAC CATHETERIZATION   PROCEDURES PERFORMED:  1. Selective coronary angiography by Judkins technique.  2. Retrograde left heart catheterization.  3. Left ventricular angiography.  4. Abdominal aortography, suprarenal with bifemoral runoff.   INTERVENTIONS:  None.   COMPLICATIONS:  None.   PATIENT PROFILE:  Ms. [name] is a 69 year old white female who is a  cardiology patient of Dr. Nanetta Glass, who has had left flank pain  for the last 4 days with nausea.  She has fast atrial fibrillation,  which is a new diagnosis and an elevated D-dimer, and the patient also  has a history of hypertension and microhematuria.  Her cardiac enzymes  were elevated during hospitalization.  D-dimer was 3.31.  She had a  negative Myoview test in 2007, but her troponin was 0.07 and subsequent  troponin levels were 1.41 and 0.19.  The patient's EKG has shown atrial  fibrillation with a ventricular response as high as 150 with nonspecific  ST-T abnormalities.  The patient came to the cath lab today for further  evaluation of coronary artery status in view of her rhythm, her flank  pain, and her positive cardiac enzymes.  Today's procedure was performed  electively without any complications.   RESULTS:  Pressures:  The left ventricular pressure was 94/11, end-  diastolic pressure 14, and central aortic pressure was 98/67 with a mean  of 81.  There was a nonsignificant gradient across the aortic valve by  pullback technique.   ANGIOGRAPHIC RESULTS:  The patient's coronary arteries showed  calcification in the right coronary artery and along the first half of  the LAD and the ramus  intermediate branch.   There were trivial calcifications along the aortic root.   The left ventricle showed vigorous contractility of all wall segments  with ejection fraction estimate of 60-70%.  There was catheter-induced  mitral regurgitation and it was somewhere between mild to moderate and  echocardiography will be needed to further evaluate her mitral  regurgitation.   The patient's aortic root had calcifications in the aortic knob.   The left anterior descending coronary artery coursed the cardiac apex,  but it was a relatively small vessel.  There was a hypodense area in the  proximal LAD before the first septal perforator branch that was as much  as 75% narrowed visually.  There was also ostial stenosis in her septal  perforator branch, which was as large in circumference as her LAD, but  not quite as long   The intermediate ramus branch was a large vessel with only luminal  irregularities and did not have any high-grade stenosis.   The right coronary artery gave rise to posterior descending and  posterolateral branches, and there were scattered lesions throughout the  proximal and  mid RCA, which included an ostial RCA of 90%, mid RCA of  75%, and then another more distal stenosis of 90% in her RCA.  The  distal RCA appeared bypassable.   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   Left ventricular angiography showed vigorous contractility of all wall  segments.  There was 2 to 3+ mitral regurgitation that was catheter  induced.  I think that the patient will require echocardiography to  further evaluate mitral regurgitation.   The abdominal aorta showed single renal arteries both right and left  with wide patency of the arterial lumen.   The right common iliac was calcified, but not significantly obstructed.  There were 3 overlying stents in her left common  iliac that provided  good blood flow through those patent stents.   FINAL IMPRESSIONS:  1. Recent positive enzymes in a patient with atrial fibrillation and      flank pain.  2. Two-vessel coronary artery disease, 75% LAD and 3 stenoses of 75-      90% in her RCA.  3. 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  4. Normal ramus intermediate branch.  5. Normal LV systolic function.   PLAN:  The patient would appear to be a candidate for coronary artery  bypass grafting to her RCA and to her LAD and then there is also room  for discussion as to whether or not her ramus would need bypass grafting  as well.  We will discuss this with our cardiovascular surgeons.  The  patient will be maintained on medical therapy until that time.           ______________________________  [name], M.D.     WHG/MEDQ  D:  07/30/2007  T:  07/31/2007  Job:  [id number]   cc:   [name], M.D.

## 2010-08-16 NOTE — H&P (Signed)
NAMETRUDEE, CHIRINO NO.:  [id number]   MEDICAL RECORD NO.:  [id number]          PATIENT TYPE:  EMS   LOCATION:  MAJO                         FACILITY:  [facility]   PHYSICIAN:  Isidor Holts, M.D.  DATE OF BIRTH:  March 09, 1941   DATE OF ADMISSION:  07/27/2007  DATE OF DISCHARGE:                              HISTORY & PHYSICAL   PRIMARY MEDICAL DOCTOR:  Tammy R. Collins Scotland, M.D.   PRIMARY CARDIOLOGIST:  Dr. Nanetta Batty.   CHIEF COMPLAINT:  Left flank pain for 4 days, also nausea.   HISTORY OF PRESENT ILLNESS:  This is a 70 year old female.  For past  medical history, see below.  The patient is an excellent historian, and  supplied the history herself.  She states that since July 24, 2007, she  started experiencing left loin pain radiating around the side to the  front of her abdomen.  This was constant and unremitting.  She did not  utilize any over-the-counter medication, but remained in bed constantly,  did not eat very much, drank only a little fluid.  Denies fever or  chills.  Denies vomiting or diarrhea.  Denies dysuria or urinary  frequency.  She eventually went to see her primary MD, Dr. Herb Grays,  on July 27, 2007, were she was examined, found to have a tachycardia  and was referred to the emergency department for further evaluation.   PAST MEDICAL HISTORY:  1. Hypertension.  2. Dyslipidemia.  3. Peripheral vascular disease with bilateral proximal external iliac      artery stenosis, left more than right, status post angioplasty and      stent of left external iliac artery and left common femoral artery      on April 06, 2005 by Dr. Nanetta Batty.  4. Status post partial hysterectomy decades ago.   MEDICATION HISTORY:  1. Plavix 75 mg p.o. daily.  2. Zetia 10 mg p.o. daily.  3. Aspirin 81 mg p.o. daily.  4. Welchol (625 mg) 6 tablets p.o. daily.  The patient, however, is      not very compliant with this.   ALLERGIES:  NO KNOWN DRUG  ALLERGIES.   REVIEW OF SYSTEMS:  As per HPI and chief complaint, otherwise negative.   SOCIAL HISTORY:  The patient is divorced since about 20 years ago, has 2  offspring, a daughter who is age 56 years with multiple sclerosis and a  son who died of myocardial infarction at age 64 years.  She is an ex-  smoker.  Smoked for approximately 20 years but quit approximately 11  years ago.   FAMILY HISTORY:  The patient's father died at age 21 years status post  MI.  Her mother died following recurrent CVAs in her late 58s.  Family  history is otherwise noncontributory.   PHYSICAL EXAMINATION:  VITALS:  Temperature 99.9, pulse 150 per minute,  however, following intravenous administration of a 15 mg bolus of  Cardizem in the emergency department, dropped to 69 per minute.  Subsequently 116 per minute.  Respiratory rate 18, BP 119/78 mmHg, pulse  oximeter 98%  on room air.  The patient did not appear to be in obvious  acute distress at the time of this evaluation.  Alert, communicative,  not short of breath at rest.  HEENT:  No clinical pallor or jaundice.  No conjunctival injection.  Throat is clear.  NECK:  Supple.  JVP not seen.  No palpable lymphadenopathy.  No palpable  goiter.  CHEST:  Clinically clear to auscultation.  No wheezes or crackles.  HEART:  Sounds 1 and 2 heard, normal, irregularly irregular.  No  murmurs.  ABDOMEN:  Full, soft and nontender.  No palpable organomegaly.  No  palpable masses.  Normal bowel sounds.  There is no tenderness to renal  angle punch bilaterally.  LOWER EXTREMITY EXAMINATION:  No pitting edema.  Palpable peripheral  pulses, although the left lower extremity is somewhat cooler than the  right.  CENTRAL NERVOUS SYSTEM:  No focal neurologic deficits on gross  examination.  MUSCULOSKELETAL SYSTEM:  Not formally examined although osteoarthritic  changes are noted.   INVESTIGATIONS:  CBC:  WBC 17.2, hemoglobin 15.1, hematocrit 44.7,  platelets 304.   Electrolytes:  Sodium 132, potassium 3.6, chloride 100,  CO2 24, BUN 20, creatinine 1.1, glucose 138.  CBG 134.  D-dimer is  elevated at 3.31.  Urinalysis shows WBCs 0-2, RBC 7-10, bacteria rare.  Chest x-ray dated July 27, 2007 shows no acute cardiopulmonary  findings.  Abdominal/pelvic CT scan dated July 27, 2007 shows left  perinephric stranding without hydronephrosis or radiopaque calculus.  Colonic diverticula are noted.  No diverticulitis.  Uterus is surgically  absent.  There is levoscoliosis of the spine with diffuse degenerative  disk disease.  There were no acute intrapelvic findings.  A 12-lead EKG  dated July 27, 2007, showed atrial fibrillation with a rapid  ventricular response rate of 150 per minute.  Following administration  of intravenous Cardizem bolus in the emergency department, ventricular  rate dropped to 84 per minute.  Also, old Q-waves are noted in V1 and  V2.   ASSESSMENT AND PLAN:  1. Left loin pain with microhematuria.  Clinically this appears      consistent with renal colic.  The patient has likely passed a      stone, which is why it is not evident on abdominal CT scan.      Although the patient may indeed have a radiopaque stone, there is      no evidence of hydronephrosis.  We shall manage with intravenous      fluid hydration, particularly as the patient appears to be mildly      dehydrated, utilize p.r.n. analgesics, do renal ultrasound to rule      out a radiolucent stone.   1. Fast atrial fibrillation.  This is a new diagnosis.  We shall      manage with intravenous Cardizem infusion.  The patient has had      partial response to an intravenous bolus of Cardizem administered      by the emergency room physician.  Otherwise we shall manage per      cardiology recommendations.  The patient has already been consulted      by Dr. Yates Decamp, [facility].  Meanwhile, we      shall cycle cardiac enzymes and do a TSH.   1. Elevated  D-dimer.  We, of course, need to rule out possible      pulmonary embolism and, indeed, this may be the precipitant for #2  above.  To elucidate this, we shall do a chest CT angiogram.  The      patient has already been commenced on an intravenous infusion of      Heparin for her atrial fibrillation.   1. Hypertension.  The patient is currently normotensive.  We will      observe.   1. History of dyslipidemia.  We shall check lipid profile.   Further management will depend on clinical course.      Isidor Holts, M.D.  Electronically Signed     CO/MEDQ  D:  07/27/2007  T:  07/27/2007  Job:  [id number]

## 2010-08-16 NOTE — Discharge Summary (Signed)
[name], [name] NO.:  [id number]   MEDICAL RECORD NO.:  [id number]          PATIENT TYPE:  INP   LOCATION:  2002                         FACILITY:  [facility]   PHYSICIAN:  Sheliah Plane, MD    DATE OF BIRTH:  [birth year]   DATE OF ADMISSION:  07/27/2007  DATE OF DISCHARGE:  08/11/2007                               DISCHARGE SUMMARY   ADMITTING DIAGNOSES:  1. Atrial fibrillation with rapid ventricular response (RVR).  2. Unstable angina.  3. Coronary artery disease.  4. Probable renal stones.  5. Peripheral vascular disease.  6. History of hypertension.  7. History of dyslipidemia.   DISCHARGE DIAGNOSES:  1. Atrial fibrillation with rapid ventricular response (RVR).  2. Unstable angina.  3. Coronary artery disease.  4. Probable renal stones.  5. peripheral vascular disease.  6. History of hypertension.  7. History of dyslipidemia.  8. Phlebitis of the left forearm.  9. Arterial coronary sinus fistula.   PROCEDURES:  1. Cardiac catheterization performed on July 30, 2007.  2. Coronary artery bypass grafting x 2 (LIMA to LAD and reverse      saphenous vein graft to the distal right coronary artery with a      modified left-sided maze and ligation of the left atrial appendage      as well as ligation of arteriovenous fistula) and left coronary      system to the coronary sinus with endovascular vein harvesting of      the right lower extremity done by Dr. Tyrone Sage on Aug 05, 2007.   HOSPITAL COURSE:  This is a 70 year old Caucasian female, patient of Dr.  Allyson Sabal, who states that since July 24, 2007, she was very experiencing  left groin pain radiating around the side to the front of her abdomen.  She went to see her primary care physician, Dr. Collins Scotland on July 27, 2007.  She was found to have tachycardia and she was referred to [facility]  Emergency Room for further evaluation .  On May 2009, she was found to  be tachycardic.  She was sent to [facility]  Emergency Room for further  evaluation.  Her troponin CK-MB were elevated as well as D-dimer.  CT of  the chest, however, was negative for PE.  She was found to be in atrial  fibrillation with RVR (no previous history of atrial fibrillation).  She  was originally placed on Cardizem drip and heparin.  Cardizem drip was  stopped.  She was then placed on an amiodarone drip.  It should be noted  that her left groin pain and microhematuria were felt secondary to a  passed kidney stone.  The patient underwent cardiac catheterization on  July 30, 2007, and she was found to have a 75% proximal stenosis of the  LAD as well as a 90% ostial, 75% mid, 90% distal stenosis of the RCA.  She was also found to have mitral regurgitation and her left ventricular  function was preserved at 60%.  The patient was given Plavix on this  day.  An echocardiogram was done that showed  the aortic valve to be  mildly calcified, but no AS or AR.  Mild TR and MR and EF of 65%.   Several days passed after the Plavix had been given.  The patient then  underwent the aforementioned CABG x2, left-sided maze with ligation of  left atrial appendage and ligation of AV fistula on Aug 05, 2007.  Postoperatively, the patient was extubated the evening of postop day 1.  She did require A pacing.  She also had a brief episode of AFib with  RVR.  She was on a dopamine drip.  This was weaned as tolerated.  Chest  tubes were removed, but follow-up chest x-ray revealed no pneumothorax.  The patient had been placed on Coumadin postoperatively.  PT and INR  monitored closely daily.  She was volume overloaded and diuresed  accordingly.  The patient did convert to normal sinus rhythm.  In  addition, the patient was found to have phlebitis of the left upper  extremity secondary to infiltrated IV site.  There was bruising present,  however, the air was not warm or erythematous and the patient did remain  afebrile.  She continued to work with  cardiac rehab and continued to  improve such by postop day number 6, Aug 11, 2007, she was afebrile.  Her vital signs were stable.  Her O2 sat was 92% on room air.  Her INR  on this day was 2.4.  CBGs were well controlled on 129/114.  Her preop  weight was 61 kg, today it was 66.8 kg.   PHYSICAL EXAMINATION:  CARDIOVASCULAR:  Regular rate and rhythm.  PULMONARY:  Decreased at the bases, more so on the left.  EXTREMITIES:  A +1 edema of the right lower extremity.  Sternal and  right lower extremity wounds were clean, dry and continuing to heal.  Her pacing wires had been removed on Aug 10, 2007.  Her chest tube  sutures removed on Aug 11, 2007 and she was discharged on this date.   DISCHARGE INSTRUCTIONS:  To remain on low-fat, low-salt diet, no driving  or lifting more than 10 pounds until instructed to do so.  She is to  continue with her breathing exercises daily, walk every day and increase  her frequency and durations as tolerated.  Follow-up appointments  include, Dr. Hazle Coca office in 2 weeks, she is to call for an  appointment, Dr. Dennie Maizes office in 3 weeks, she needed to call for an  appointment and a PT and INR were to be obtained on Monday Aug 12, 2007  at the patient's request with dr Dr. Yehuda Budd' office.   DISCHARGE MEDICATIONS:  EC-ASA 81 mg p.o. daily, Zetia 10 mg p.o. daily,  Lipitor 10 mg p.o. nightly, amiodarone 200 mg p.o. twice daily for 2  weeks, then amiodarone 200 mg p.o. daily thereafter and Coumadin 2.5 mg  p.o. daily or as directed.  She was instructed to not take any Coumadin  the evening of Aug 11, 2007 and she was to resume Coumadin Aug 12, 2007.  Lisinopril 2.5 mg p.o. daily, Lopressor 25 mg p.o. twice daily, Ultram  50 mg gram 1 or 2 tablets p.o. q. 4-6 hours p.r.n. pain.   Latest chest x-ray was done on Aug 11, 2007, which showed trace right  pleural effusion, some increase in the small left pleural effusion  (atelectasis also present).   Previous  laboratory studies, BMET done on Aug 10, 2007, showed a  potassium to be 4.2, creatinine to be  11 and 0.54 respectively.  Last  CBC done on Aug 09, 2007, showed H&H 10 and 32.4, white count of 9800,  platelet count 218,000.      Doree Fudge, [place]      Sheliah Plane, MD  Electronically Signed    DZ/MEDQ  D:  09/03/2007  T:  09/04/2007  Job:  [id number]   cc:   Tammy R. Collins Scotland, M.D.  Nanetta Batty, M.D.

## 2010-08-16 NOTE — Op Note (Signed)
NAMERAMEEN, GOHLKE NO.:  [id number]   MEDICAL RECORD NO.:  [id number]          PATIENT TYPE:  INP   LOCATION:  2002                         FACILITY:  [facility]   PHYSICIAN:  Sheliah Plane, MD    DATE OF BIRTH:  Dec 15, 1940   DATE OF PROCEDURE:  08/05/2007  DATE OF DISCHARGE:                               OPERATIVE REPORT   PREOPERATIVE DIAGNOSIS:  Coronary occlusive disease, atrial  fibrillation, and arterial-coronary sinus fistula.   PROCEDURE PERFORMED:  Coronary artery bypass grafting x2 with the left  internal mammary to the left anterior descending coronary artery and  reverse saphenous vein graft to the distal right coronary artery with  modified left-sided maze and ligation of left atrial appendage and  ligation of arterial venous fistula from the left coronary system to the  coronary sinus with right leg endovein harvesting.   SURGEON:  Sheliah Plane, MD   FIRST ASSISTANT:  Coral Ceo, P.A.   BRIEF HISTORY:  The patient is a 70 year old female who was originally  admitted to the hospital with progressive weakness over a several day  period and flank pain.  She was seen in medical doctor's office and  noted to be in rapid atrial fibrillation.  She was admitted to the  hospital.  Troponins and CK-MB were elevated.  Ultimately on IV  amiodarone, the rhythm was stabilized.  She had no previous history of  atrial fibrillation.  Cardiac catheterization was performed.  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  There was 70%-80%  LAD stenosis in addition with an additional injection of the left  system.  There was prompt filling of the coronary sinus with fistula  appeared to be arising from the circumflex coronary artery.  The patient  was stabilized medically and coronary artery bypass grafting  was  recommended.  The patient agreed and signed informed consent.   DESCRIPTION OF PROCEDURE:  With Swan-Ganz and arterial line monitors in  place the patient underwent general endotracheal anesthesia without  incident.  The skin of the chest and legs prepped with Betadine and  draped in the usual sterile manner.  Using the Guidant endovein  harvesting system, a segment of vein was harvested from the right thigh  and was of good quality and caliber.  Median sternotomy was performed.  Left internal mammary artery was dissected down as pedicle graft to  distal artery was divided with good free flow.  The pericardium was  opened.  Heart was examined.  There was no obvious abnormality of the  coronary sinus other than being prominent.  The left atrial appendage  was larger than normal.  There was a common circumflex coronary artery  as it came close to the left atrial appendage, appeared to have a  fistulous connection directly into the coronary sinus in the vicinity of  the left atrial appendage.  This was easily identified and was dissected  free of the epicardial fat.  The patient  was systemically heparinized,  ascending aorta was cannulated, the right atrium was cannulated, and  aortic root vent cardioplegia needle was introduced into the ascending  aorta.  The patient was placed on cardiopulmonary bypass 2.4 L/min/sq.  m.  Using the Guidant bipolar ablation dilation system, a full-thickness  ablation was performed across the left atrial appendage and around the  right and left pulmonary veins without opening the left atrium.  The  left atrial appendage was doubly ligated.  The coronary sinus fistula  was encircled and also doubly ligated.  Attention was then turned to the  coronaries.  The distal right coronary artery was opened and admitted a  1.5-mm probe using a running 7-0 Prolene.  Distal anastomosis was  performed and the second reverse saphenous vein grafts anastomosed to  the  circumflex coronary artery.  Attention was then turned to left  anterior descending coronary which was opened in mid portion with a  running 8-0 Prolene.  Left internal mammary artery was anastomosed to  the left anterior descending coronary artery.  With the release of the  mammary bulldog, there was rise in myocardial septal temperature and the  bulldog was placed back on the mammary artery.  Additional cold blood  cardioplegia was administered with the cross-clamp still in place.  A  single punch aortotomy was performed in the ascending aorta and the vein  graft to the right coronary system was anastomosed to the ascending  aorta.  Heart was allowed to passively fill and de-airing the aortic.  Cross-clamp was removed with total crossclamp time 61 minutes.  The  patient was spontaneously converted to a sinus rhythm.  She was atrially  paced to increase rate.  Sites of anastomosis were inspected free of  bleeding.  The patient was re-warmed and ventilated and weaned from  cardiopulmonary bypass without difficulty.  She was decannulated in  usual fashion.  Protamine sulfate was administered with operative field  hemostatic.  Two atrioventricular pacing wires had been applied.  Graft  marker was then applied.  A left pleural tube a Blake mediastinal drain  was left in place.  Pericardium was loosely reapproximated.  Sternum was  closed with #6 stainless steel wire.  Fascia closed with interrupted 0  Vicryl, running 3-0 Vicryl in subcutaneous tissue, 4-0 subcuticular  stitch in skin edges.  Dry dressings were applied.  Sponge and needle  count was reported as correct at the completion of procedure.  The  patient tolerated the procedure without obvious complication.  Preoperatively, the patient had some degree of mitral regurgitation  noted at the time of catheterization.  A transthoracic echo was done  preoperatively showed mild mitral regurgitation.  TEE was done during  the operative  procedure, dictated under separate note.  The patient had  trace to 1+ mitral regurgitation without structural abnormalities of  valve.  It was felt not warranted to repair or replace it.      Sheliah Plane, MD  Electronically Signed     EG/MEDQ  D:  08/09/2007  T:  08/09/2007  Job:  [id number]   cc:   [name], M.D.

## 2010-08-16 NOTE — Discharge Summary (Signed)
[name], [name] NO.:  [id number]   MEDICAL RECORD NO.:  [id number]          PATIENT TYPE:  INP   LOCATION:  2917                         FACILITY:  [facility]   PHYSICIAN:  Hettie Holstein, D.O.    DATE OF BIRTH:  1941-03-16   DATE OF ADMISSION:  07/27/2007  DATE OF DISCHARGE:                               DISCHARGE SUMMARY   PRIMARY CARE PHYSICIAN:  Dr. Herb Grays.   PRIMARY CARDIOLOGIST:  Dr. Nanetta Batty.   PROBLEM LIST:  1. Two-vessel coronary artery disease as determined by cardiac      catheterization performed due to elevations of troponins in the      context of rapid atrial fibrillation.  She is currently awaiting      the decision to be made with reference to revascularization for Dr.      Tyrone Sage.  In the interim, she is on heparin drip and withholding      Plavix until this decision can be made.  2. Most probably passed renal stones.  She presented with some flank      discomfort that has resolved, and she had some microscopic      hematuria on presentation.  These symptoms have resolved.  Her CT      scan of her pelvis, the left perinephric stranding without      hydroureteral nephrosis or radiopaque renal stone or bladder      calculus.  3. Rapid atrial fibrillation, currently requiring amiodarone drip.      Additionally, she is on Lopressor and Digoxin, and being followed      and titrated on these therapies via [facility],      Dr. Elsie Lincoln.  4. Unstable angina elevated troponin status post evaluation of this by      cardiac catheterization.  For full details, please refer to the      catheterization note.  5. Peripheral vascular disease, known, status post intervention by Dr.      Nanetta Batty in the past.  6. Post-infusion phlebitis.  This is felt to possibly be related to      amiodarone.  She had initially developed this on the right wrist      where she had bilateral peripheral access at this time.  We are  pursuing a PICC line.   Studies performed at this point, cardiac catheterization as described  above revealing two-vessel coronary disease.  This was performed by Dr.  Elsie Lincoln, this revealed 75% LAD lesion, and 90% lesions in her right  coronary artery.  Her left ventricle function was preserved subsequent  to the echocardiogram which revealed the same with an ejection fraction  of 65%.  Additional studies and chemistry at this time revealed a sodium  of 139, potassium 35, BUN 5, creatinine 0.58 and a glucose of 122.  BNP  was 577, d-dimer was initially elevated at 3.31.  Her lipid profile  revealed an LDL 110 and HDL of 65.  Digoxin level on July 30, 2007, was  1.3 and TSH was 2.1.   IMAGING STUDIES:  Her CT  angiogram of the chest revealed,  1. No evidence of pulmonary emboli and advanced vascular calcification      and atelectasis.  CT of the abdomen as described above revealed      left perinephric stranding without hydroureteral nephrosis,      radiopaque renal, ureteral, or bladder calculi.  This may reflect a      recently passed stone, a non-radiopaque calculus or other renal      pathology such as pyelonephritis.  Pelvis CT, revealed no acute      pelvic findings.  Renal ultrasound revealed no hydronephrosis or      focal or inguinal bowel abnormality involving either kidney.   HISTORY OF PRESENT ILLNESS:  For full details please refer to the H&P as  dictated by Dr. Isidor Holts.  However, briefly [name] is very  pleasant 70 year old female who started experiencing left loin pain  radiating to around her side to the front of her abdomen consistent, and  she described as constant and unremitting.  She denied fever, nausea or  vomiting, dysuria or urinary frequency.  She presents to her primary MD.  She was found to be tachycardia and sent to the emergency department for  further evaluation.   HOSPITAL COURSE:  The patient was admitted to the emergency department   initially and discovered to have some microhematuria, clinically was  felt that this was related to a passed renal stone.  These symptoms are  abated.  However, as she was also discovered to be in fast atrial  fibrillation, and elevated d-dimer [facility] was  involved at the time.  Eventually, she had some elevated troponin of  1.41, though in the context of rapid atrial fibrillation cardiology  proceeded to perform cardiac catheterization, and the findings were as  described above.  Additionally, a 2-D echocardiogram revealed preserved  ejection fraction.  At this juncture, we are currently awaiting the  decision now with regards to revascularization.      Hettie Holstein, D.O.  Electronically Signed     ESS/MEDQ  D:  08/01/2007  T:  08/02/2007  Job:  [id number]

## 2010-08-16 NOTE — Consult Note (Signed)
NAMELYDIANN, BONIFAS NO.:  [id number]   MEDICAL RECORD NO.:  [id number]          PATIENT TYPE:  INP   LOCATION:  2917                         FACILITY:  [facility]   PHYSICIAN:  Sheliah Plane, MD    DATE OF BIRTH:  [birth year]   DATE OF CONSULTATION:  07/30/2007  DATE OF DISCHARGE:                                 CONSULTATION   REQUESTING PHYSICIAN:  [name], MD.   Elenor Quinones CARDIOLOGIST:  Nanetta Batty, MD.   PRIMARY CARE PHYSICIAN:  Tammy R. Collins Scotland, MD.   REASON FOR CONSULTATION:  Anomalous coronary arteries and elevated  troponin.   HISTORY OF PRESENT ILLNESS:  The patient is a 70 year old female who  noted that on July 24, 2007, Thursday, she started having epigastric  discomfort and left flank pain in her abdomen and rested in bed.  She  primarily noted weak legs.  She denied fever, chills, or vomiting.  Denied any chest pain.  She noted that the following day, she felt a  little better and had her daughter take her to [employer], but before really  getting out of the car, felt weak legs, and felt poorly with shortness  of breath, and returned to the car and went back home.  She was seen on  July 27, 2007, by Dr. Collins Scotland who found her to be tachycardic, and was  sent to the emergency room and admitted.  She was found to have new  onset of rapid atrial fibrillation, and on July 29, 2007, had a peak  troponin level of 1.48, peak CK of 104, and MB of 16.6.  She was treated  for rapid atrial fibrillation, which continued to be ongoing.  She  underwent cardiac catheterization today by Dr. Elsie Lincoln and a cardiac  surgery consultation was requested.   PREVIOUS CARDIAC HISTORY:  The patient has had no previous history of  myocardial infarction.  No previous history of coronary angioplasty or  prior surgery.  We have no records of previous echocardiograms or  Cardiolite stress test.  Cardiac risk factors include hypertension,  hyperlipidemia.  Denies diabetes.   She is a remote smoker, quit 11 years  ago.   FAMILY HISTORY:  Positive for a son who died suddenly at age 53 of  unknown cause.   PAST MEDICAL HISTORY:  Significant for:  1. Peripheral vascular disease.  2. Hypertension.  3. Dyslipidemia.   PAST SURGICAL HISTORY:  1. Hysterectomy in her 30s.  2. Childbirth x2.  3. Previous angioplasty and stenting of the left iliac artery for      peripheral vascular disease.   SOCIAL HISTORY:  The patient is divorced, has 3 children.  Retired.  Denies alcohol use.   MEDICATIONS:  1. Plavix 75 mg a day.  Currently the patient continues on Plavix      including a dose today prior to her catheterization.  2. Zetia 10 mg a day.  3. Aspirin 81 mg a day.  4. WelChol 625 mg 6 tablets p.o. daily.   1. She is also on Lanoxin 0.125 a day.  2. Lopressor 25  b.i.d.   ALLERGIES:  NONE KNOWN.   REVIEW OF SYSTEMS:  CARDIAC:  The patient experienced exertional  shortness of breath and epigastric and flank pain.  Denies syncope,  presyncope, orthopnea.  Does have palpitations.  Denies lower extremity  edema.  GENERAL:  The patient does note significant constitutional  symptoms.  Denies fever, chills, or night sweats, but notes that for the  past week to 10 days, she has been very weak and really felt unable to  walk because of weakness in her legs.  RESPIRATORY:  Exertional  shortness of breath.  GASTROINTESTINAL:  Denies any change in bowel  habits.  NEUROLOGIC:  Denies amaurosis or TIAs.  MUSCULOSKELETAL:  As  noted above.  GU:  Denies any flank pain.  Did have microhematuria on  admission.  Denies any recent infections.  PSYCHIATRIC:  Denies  psychiatric history.   PHYSICAL EXAMINATION:  VITAL SIGNS:  The patient's  heart rate is 128 in  atrial fibrillation, blood pressure is 114/83, respiratory rate 16, and  O2 sats 100%.  GENERAL:  The patient appears much older than her stated age of 24  years.  She is awake, alert, and neurologically intact  and able to  relate her history.  NECK:  I do not appreciate any carotid bruits or jugular venous  distention.  LUNGS:  Clear bilaterally.  CARDIAC:  Reveals regular rate and rhythm.  I do not appreciate any  murmur of mitral insufficiency, though she does have evidence of mitral  insufficiency at the time of cath.  ABDOMEN:  Benign without palpable masses.  EXTREMITIES:  Lower extremities with +1 DP pulse on the left, no PT.  On  the right, she has +1 DP and PT pulses.   LABORATORY FINDINGS:  CBC:  Hematocrit 36.9, hemoglobin 12.8, platelet  count 166, and white count was elevated to 17,000 at the time of  admission and has now dropped to 7.1.  Creatinine is 0.3, BUN 4, BNP  577, and TSH 2.117.   Echocardiogram is pending.  Cardiac catheterization films are reviewed.  The patient's films are complex, and she has multiple anomalies.  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  There is really no left  main, but dual origin of a moderate-sized intermediate and moderate-  sized LAD.  Both are with obvious calcifications in the wall, but  without high-grade stenosis.  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  The right coronary artery  past the take-off of the anomalous circumflex has disease.  Some views  as much as 80%, other views not as bad.  The mid and distal right has  got 70%-80% stenosis.  There is mild-to-moderate mitral regurgitation.   IMPRESSION:  Elderly-appearing female admitted with severe weakness and  probably new onset of rapid atrial fibrillation.  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  Mild troponin   elevation.  Anticoagulation with Plavix.   SUGGESTIONS:  On first review of the films, it is not obvious that the  patient urgently  needs coronary artery bypass grafting.  She does have  disease in the right coronary artery, I am  not sure whether the extent  of the disease  in the intermediate and LAD warrant coronary artery bypass grafting.  She is in rapid atrial fibrillation that is still not controlled, and on  Plavix.  We will review the films with Dr. Elsie Lincoln before making a final  decision.  If we did proceed with surgery, it would be at least a week  with her current situation on Plavix.      Sheliah Plane, MD  Electronically Signed     EG/MEDQ  D:  07/30/2007  T:  07/31/2007  Job:  [id number]   cc:   Tammy R. Collins Scotland, M.D.  [name], M.D.

## 2010-08-19 NOTE — Cardiovascular Report (Signed)
[name], BUCHBINDER                 ACCOUNT NO.:  [id number]   MEDICAL RECORD NO.:  [id number]          PATIENT TYPE:  OIB   LOCATION:  6531                         FACILITY:  [facility]   PHYSICIAN:  Nanetta Batty, M.D.   DATE OF BIRTH:  1940/08/26   DATE OF PROCEDURE:  04/06/2005  DATE OF DISCHARGE:                              CARDIAC CATHETERIZATION   PROCEDURE:  Peripheral angiogram/percutaneous transluminal coronary  angioplasty and stent.   INDICATION:  Ms [name] is a 70 year old single white female, mother of 1  living child, grandmother to 4 grandchildren, who is referred by Dr. Collins Scotland  for evaluation of claudication.  Risk factor profile is positive for  hypertension, hyperlipidemia, family history and continued tobacco abuse.  She has complained of progressive buttock and lower extremity claudication  while walking up stairs, left greater than right.  Dopplers revealed ABI of  0.93 on the right and 0.8 on the left.  CT angiogram suggested high-grade  iliac disease, left greater than right.  She presents now for angiography  and potential endovascular therapy for lifestyle-limiting claudication.   PROCEDURE DESCRIPTION:  The patient was brought to the sixth floor [facility] in the post-absorptive state.  She was premedicated with p.o. Valium.  Her right groin was prepped and  draped in the usual sterile fashion.  Xylocaine 1% was used for local  anesthetic.  A 5-French sheath was inserted into the right femoral artery  using standard Seldinger technique.  A 5-French Tennis Racquet catheter was  used for midstream and distal abdominal aortography with bifemoral runoff.  Visipaque dye was used for the entirety of the case.  Retrograde aortic  pressure was monitored during the case.   ANGIOGRAPHIC RESULTS:  1.  Abdominal aorta:      1.  Renal artery -- femoral.      2.  Infrarenal abdominal aorta -- femoral.  2.  Left lower extremity:  1.  Ninety percent segmental proximal left external iliac artery          stenosis with at least a 50-mm pullback gradient.      2.  Seventy percent left common femoral artery stenosis with a 25-mm          transstenotic gradient after administration of 200 mcg of intra-          arterial nitroglycerin through the end-hole catheter.      3.  Essentially two-vessel runoff with a diminutive diffusely diseased          anterior tibial.  3.  Right lower extremity:      1.  Forty percent to 50% segmental proximal right SFA stenosis with two-          vessel runoff with diminutive, diffusely diseased anterior tibial.   PROCEDURE DESCRIPTION:  The patient received 2500 units of heparin  intravenous with an ACT of 201.  A crossover catheter and Wholey wire were  used for contralateral access and the sheath was exchanged for a 7-French  long Terumo crossover sheath.  PTA was performed on both lesions with a  5 x  3 Powerflex.  Stenting was performed on the proximal external iliac with a 7  x 4 Smart and on the common femoral with a 6 x 3 Smart.  Both lesions were  postdilated with a 6 x 4 Powerflex at 3-4 atmospheres, resulting in  reduction of 70% and 90% stenoses to 0% residual with excellent flow and no  systemic end dissection.  The patient tolerated the procedure well.  The  sheath was removed and pressure was held on the groin to achieve hemostasis.  The patient left the lab in stable condition.   She will be treated with aspirin and Plavix, discharged home in the morning  after being hydrated overnight and we will get followup Dopplers in our  office, after which time she will be seen back in followup.  She left the  lab in stable condition.      Nanetta Batty, M.D.  Electronically Signed     JB/MEDQ  D:  04/06/2005  T:  04/07/2005  Job:  [id number]   cc:   [facility]   [address], [place], [place] [address] [facility]    Tammy R. Collins Scotland, M.D.  Fax: [phone]

## 2010-12-27 LAB — CARDIAC PANEL(CRET KIN+CKTOT+MB+TROPI)
CK, MB: 11.6 — ABNORMAL HIGH
CK, MB: 16.6 — ABNORMAL HIGH
CK, MB: 2.8
Relative Index: INVALID
Relative Index: INVALID
Relative Index: INVALID
Total CK: 30
Total CK: 69
Troponin I: 0.18 — ABNORMAL HIGH
Troponin I: 0.54
Troponin I: 1.41

## 2010-12-27 LAB — URINALYSIS, ROUTINE W REFLEX MICROSCOPIC
Bilirubin Urine: NEGATIVE
Glucose, UA: NEGATIVE
Ketones, ur: 15 — AB
Ketones, ur: 15 — AB
Nitrite: NEGATIVE
Protein, ur: 100 — AB
Urobilinogen, UA: 2 — ABNORMAL HIGH
pH: 6

## 2010-12-27 LAB — BASIC METABOLIC PANEL
BUN: 13
BUN: 4 — ABNORMAL LOW
BUN: 5 — ABNORMAL LOW
BUN: 6
CO2: 32
Calcium: 8.4
Calcium: 8.4
Creatinine, Ser: 0.52
Creatinine, Ser: 0.58
Creatinine, Ser: 0.63
Creatinine, Ser: 0.9
GFR calc Af Amer: >60
GFR calc Af Amer: >60
GFR calc non Af Amer: >60
GFR calc non Af Amer: >60
GFR calc non Af Amer: >60
Glucose, Bld: 113 — ABNORMAL HIGH
Glucose, Bld: 125 — ABNORMAL HIGH
Potassium: 2.8 — ABNORMAL LOW
Potassium: 3.2 — ABNORMAL LOW

## 2010-12-27 LAB — CBC
HCT: 36.1
HCT: 40.8
HCT: 44.7
Hemoglobin: 12.6
Hemoglobin: 15.1 — ABNORMAL HIGH
MCHC: 34
MCHC: 34.1
MCHC: 34.6
MCHC: 34.8
MCV: 89.1
MCV: 89.2
MCV: 90.1
Platelets: 166
Platelets: 182
Platelets: 198
Platelets: 199
Platelets: 304
RBC: 4.05
RBC: 5.01
RDW: 13.9
RDW: 14.4
RDW: 14.4
RDW: 14.5
WBC: 11.1 — ABNORMAL HIGH
WBC: 17.2 — ABNORMAL HIGH

## 2010-12-27 LAB — LIPID PANEL
LDL Cholesterol: 110 — ABNORMAL HIGH
Total CHOL/HDL Ratio: 3
Triglycerides: 86
VLDL: 17

## 2010-12-27 LAB — URINE MICROSCOPIC-ADD ON

## 2010-12-27 LAB — DIFFERENTIAL
Basophils Absolute: 0
Basophils Relative: 0
Eosinophils Absolute: 0
Eosinophils Relative: 0
Lymphocytes Relative: 14
Lymphocytes Relative: 8 — ABNORMAL LOW
Lymphs Abs: 1.4
Monocytes Relative: 6
Neutro Abs: 4.4

## 2010-12-27 LAB — POCT I-STAT, CHEM 8
BUN: 20
Calcium, Ion: 0.95 — ABNORMAL LOW
Chloride: 100
HCT: 48 — ABNORMAL HIGH
Potassium: 3.6
Sodium: 132 — ABNORMAL LOW

## 2010-12-27 LAB — D-DIMER, QUANTITATIVE: D-Dimer, Quant: 3.31 — ABNORMAL HIGH

## 2010-12-27 LAB — HEPARIN LEVEL (UNFRACTIONATED): Heparin Unfractionated: <0.1 — ABNORMAL LOW

## 2010-12-27 LAB — URINE CULTURE
Culture: NO GROWTH
Special Requests: NEGATIVE

## 2010-12-27 LAB — COMPREHENSIVE METABOLIC PANEL
AST: 30
Albumin: 2.8 — ABNORMAL LOW
CO2: 30
Calcium: 8.4
Creatinine, Ser: 0.53
GFR calc Af Amer: >60
GFR calc non Af Amer: >60

## 2010-12-27 LAB — ANTITHROMBIN III: AntiThromb III Func: 92

## 2010-12-27 LAB — POCT CARDIAC MARKERS
Operator id: 265201
Troponin i, poc: 0.07 — ABNORMAL HIGH

## 2010-12-27 LAB — CK TOTAL AND CKMB (NOT AT ARMC)
CK, MB: 3
Relative Index: INVALID

## 2010-12-27 LAB — POTASSIUM: Potassium: 3.9

## 2010-12-27 LAB — TSH: TSH: 2.383

## 2010-12-27 LAB — PROTIME-INR
INR: 1.2
Prothrombin Time: 15

## 2011-04-20 ENCOUNTER — Other Ambulatory Visit: Payer: Self-pay | Admitting: Neurosurgery

## 2011-04-20 DIAGNOSIS — M542 Cervicalgia: Secondary | ICD-10-CM

## 2011-04-26 ENCOUNTER — Ambulatory Visit
Admission: RE | Admit: 2011-04-26 | Discharge: 2011-04-26 | Disposition: A | Discharge status: Home / Self Care | Payer: Medicare HMO | Source: Ambulatory Visit | Attending: Neurosurgery | Admitting: Neurosurgery

## 2011-04-26 ENCOUNTER — Other Ambulatory Visit: Payer: Self-pay | Admitting: Neurosurgery

## 2011-04-26 DIAGNOSIS — M542 Cervicalgia: Secondary | ICD-10-CM

## 2011-06-02 ENCOUNTER — Other Ambulatory Visit (HOSPITAL_COMMUNITY): Payer: Self-pay | Admitting: Cardiovascular Disease

## 2011-06-02 DIAGNOSIS — I4891 Unspecified atrial fibrillation: Secondary | ICD-10-CM

## 2011-06-08 ENCOUNTER — Ambulatory Visit (HOSPITAL_COMMUNITY)
Admission: RE | Admit: 2011-06-08 | Discharge: 2011-06-08 | Disposition: A | Discharge status: Home / Self Care | Payer: Medicare HMO | Source: Ambulatory Visit | Attending: Cardiovascular Disease | Admitting: Cardiovascular Disease

## 2011-06-08 DIAGNOSIS — F17200 Nicotine dependence, unspecified, uncomplicated: Secondary | ICD-10-CM | POA: Insufficient documentation

## 2011-06-08 DIAGNOSIS — I4891 Unspecified atrial fibrillation: Secondary | ICD-10-CM | POA: Insufficient documentation

## 2011-06-08 LAB — PULMONARY FUNCTION TEST

## 2011-07-25 ENCOUNTER — Other Ambulatory Visit: Payer: Self-pay | Admitting: Neurosurgery

## 2011-08-02 ENCOUNTER — Encounter (HOSPITAL_COMMUNITY): Payer: Self-pay | Admitting: Pharmacy Technician

## 2011-08-02 HISTORY — PX: NM MYOVIEW LTD: HXRAD82

## 2011-08-16 ENCOUNTER — Encounter (HOSPITAL_COMMUNITY): Admission: RE | Payer: Self-pay | Source: Ambulatory Visit

## 2011-08-16 ENCOUNTER — Inpatient Hospital Stay (HOSPITAL_COMMUNITY): Admission: RE | Admit: 2011-08-16 | Payer: Medicare HMO | Source: Ambulatory Visit | Admitting: Neurosurgery

## 2011-08-16 SURGERY — ANTERIOR CERVICAL DECOMPRESSION/DISCECTOMY FUSION 1 LEVEL
Anesthesia: General

## 2011-11-29 ENCOUNTER — Encounter (HOSPITAL_COMMUNITY): Payer: Self-pay | Admitting: *Deleted

## 2011-11-29 ENCOUNTER — Inpatient Hospital Stay (HOSPITAL_COMMUNITY): Payer: Medicare HMO

## 2011-11-29 ENCOUNTER — Inpatient Hospital Stay (HOSPITAL_COMMUNITY)
Admission: EM | Admit: 2011-11-29 | Discharge: 2011-11-30 | DRG: 392 | Disposition: A | Discharge status: Home / Self Care | Payer: Medicare HMO | Attending: Internal Medicine | Admitting: Internal Medicine

## 2011-11-29 ENCOUNTER — Emergency Department (HOSPITAL_COMMUNITY): Payer: Medicare HMO

## 2011-11-29 DIAGNOSIS — R112 Nausea with vomiting, unspecified: Principal | ICD-10-CM | POA: Diagnosis present

## 2011-11-29 DIAGNOSIS — Z7982 Long term (current) use of aspirin: Secondary | ICD-10-CM

## 2011-11-29 DIAGNOSIS — Z87891 Personal history of nicotine dependence: Secondary | ICD-10-CM

## 2011-11-29 DIAGNOSIS — Z951 Presence of aortocoronary bypass graft: Secondary | ICD-10-CM

## 2011-11-29 DIAGNOSIS — Z8701 Personal history of pneumonia (recurrent): Secondary | ICD-10-CM

## 2011-11-29 DIAGNOSIS — I10 Essential (primary) hypertension: Secondary | ICD-10-CM | POA: Diagnosis present

## 2011-11-29 DIAGNOSIS — I4891 Unspecified atrial fibrillation: Secondary | ICD-10-CM | POA: Diagnosis present

## 2011-11-29 DIAGNOSIS — E785 Hyperlipidemia, unspecified: Secondary | ICD-10-CM | POA: Diagnosis present

## 2011-11-29 DIAGNOSIS — R9431 Abnormal electrocardiogram [ECG] [EKG]: Secondary | ICD-10-CM

## 2011-11-29 DIAGNOSIS — R110 Nausea: Secondary | ICD-10-CM

## 2011-11-29 DIAGNOSIS — E039 Hypothyroidism, unspecified: Secondary | ICD-10-CM | POA: Diagnosis present

## 2011-11-29 DIAGNOSIS — I2510 Atherosclerotic heart disease of native coronary artery without angina pectoris: Secondary | ICD-10-CM | POA: Diagnosis present

## 2011-11-29 DIAGNOSIS — Z79899 Other long term (current) drug therapy: Secondary | ICD-10-CM

## 2011-11-29 DIAGNOSIS — I4821 Permanent atrial fibrillation: Secondary | ICD-10-CM | POA: Diagnosis present

## 2011-11-29 LAB — CBC WITH DIFFERENTIAL/PLATELET
Basophils Absolute: 0 10*3/uL (ref 0.0–0.1)
Basophils Relative: 0 % (ref 0–1)
HCT: 43.9 % (ref 36.0–46.0)
Hemoglobin: 14.4 g/dL (ref 12.0–15.0)
Lymphocytes Relative: 9 % — ABNORMAL LOW (ref 12–46)
Monocytes Absolute: 0.5 10*3/uL (ref 0.1–1.0)
Monocytes Relative: 6 % (ref 3–12)
Neutro Abs: 8 10*3/uL — ABNORMAL HIGH (ref 1.7–7.7)
Neutrophils Relative %: 85 % — ABNORMAL HIGH (ref 43–77)
RDW: 16.1 % — ABNORMAL HIGH (ref 11.5–15.5)
WBC: 9.5 10*3/uL (ref 4.0–10.5)

## 2011-11-29 LAB — COMPREHENSIVE METABOLIC PANEL
AST: 29 U/L (ref 0–37)
Albumin: 4.6 g/dL (ref 3.5–5.2)
Alkaline Phosphatase: 74 U/L (ref 39–117)
CO2: 28 mEq/L (ref 19–32)
Chloride: 96 mEq/L (ref 96–112)
Creatinine, Ser: 1.21 mg/dL — ABNORMAL HIGH (ref 0.50–1.10)
GFR calc non Af Amer: 44 mL/min — ABNORMAL LOW (ref >90)
Potassium: 3.3 mEq/L — ABNORMAL LOW (ref 3.5–5.1)
Total Bilirubin: 1.3 mg/dL — ABNORMAL HIGH (ref 0.3–1.2)

## 2011-11-29 LAB — URINALYSIS, ROUTINE W REFLEX MICROSCOPIC
Glucose, UA: NEGATIVE mg/dL
Hgb urine dipstick: NEGATIVE
Ketones, ur: NEGATIVE mg/dL
Protein, ur: 100 mg/dL — AB
Urobilinogen, UA: 4 mg/dL — ABNORMAL HIGH (ref 0.0–1.0)

## 2011-11-29 LAB — URINE MICROSCOPIC-ADD ON

## 2011-11-29 LAB — TROPONIN I: Troponin I: <0.3 ng/mL (ref <0.30)

## 2011-11-29 MED ORDER — ACETAMINOPHEN 650 MG RE SUPP: 650.0000 mg | Freq: Four times a day (QID) | RECTAL | Status: DC | PRN

## 2011-11-29 MED ORDER — AMIODARONE HCL 100 MG PO TABS
100.0000 mg | ORAL_TABLET | ORAL | Status: DC
  Filled 2011-11-29: qty 1

## 2011-11-29 MED ORDER — ASPIRIN 81 MG PO CHEW
81.0000 mg | CHEWABLE_TABLET | Freq: Every day | ORAL | Status: DC
  Administered 2011-11-30: 81 mg via ORAL
  Filled 2011-11-29: qty 1

## 2011-11-29 MED ORDER — SODIUM CHLORIDE 0.9 % IV BOLUS (SEPSIS)
500.0000 mL | Freq: Once | INTRAVENOUS | Status: AC
  Administered 2011-11-29: 500 mL via INTRAVENOUS

## 2011-11-29 MED ORDER — ACETAMINOPHEN 325 MG PO TABS: 650.0000 mg | ORAL_TABLET | Freq: Four times a day (QID) | ORAL | Status: DC | PRN

## 2011-11-29 MED ORDER — SODIUM CHLORIDE 0.9 % IV SOLN: INTRAVENOUS | Status: AC

## 2011-11-29 MED ORDER — ONDANSETRON 4 MG PO TBDP
ORAL_TABLET | ORAL | Status: AC
  Administered 2011-11-29: 4 mg via ORAL
  Filled 2011-11-29: qty 1

## 2011-11-29 MED ORDER — SODIUM CHLORIDE 0.9 % IJ SOLN
3.0000 mL | Freq: Two times a day (BID) | INTRAMUSCULAR | Status: DC
  Administered 2011-11-30: 3 mL via INTRAVENOUS

## 2011-11-29 MED ORDER — ONDANSETRON HCL 4 MG/2ML IJ SOLN: 4.0000 mg | Freq: Four times a day (QID) | INTRAMUSCULAR | Status: DC | PRN

## 2011-11-29 MED ORDER — ONDANSETRON 4 MG PO TBDP
4.0000 mg | ORAL_TABLET | Freq: Once | ORAL | Status: AC
  Administered 2011-11-29: 4 mg via ORAL

## 2011-11-29 MED ORDER — ONDANSETRON HCL 4 MG PO TABS: 4.0000 mg | ORAL_TABLET | Freq: Four times a day (QID) | ORAL | Status: DC | PRN

## 2011-11-29 MED ORDER — SODIUM CHLORIDE 0.9 % IV SOLN
INTRAVENOUS | Status: AC
  Administered 2011-11-29: 22:00:00 via INTRAVENOUS

## 2011-11-29 MED ORDER — DABIGATRAN ETEXILATE MESYLATE 150 MG PO CAPS
150.0000 mg | ORAL_CAPSULE | Freq: Two times a day (BID) | ORAL | Status: DC
  Administered 2011-11-29 – 2011-11-30 (×2): 150 mg via ORAL
  Filled 2011-11-29 (×3): qty 1

## 2011-11-29 MED ORDER — DIGOXIN 125 MCG PO TABS
125.0000 ug | ORAL_TABLET | Freq: Every day | ORAL | Status: DC
  Administered 2011-11-30: 125 ug via ORAL
  Filled 2011-11-29 (×2): qty 1

## 2011-11-29 MED ORDER — ATORVASTATIN CALCIUM 10 MG PO TABS
10.0000 mg | ORAL_TABLET | Freq: Every day | ORAL | Status: DC
  Filled 2011-11-29: qty 1

## 2011-11-29 MED ORDER — ALBUTEROL SULFATE HFA 108 (90 BASE) MCG/ACT IN AERS
2.0000 | INHALATION_SPRAY | RESPIRATORY_TRACT | Status: DC | PRN
  Filled 2011-11-29: qty 6.7

## 2011-11-29 MED ORDER — METOPROLOL TARTRATE 50 MG PO TABS
50.0000 mg | ORAL_TABLET | Freq: Two times a day (BID) | ORAL | Status: DC
  Administered 2011-11-29: 50 mg via ORAL
  Filled 2011-11-29 (×3): qty 1

## 2011-11-29 MED ORDER — LEVOTHYROXINE SODIUM 125 MCG PO TABS
125.0000 ug | ORAL_TABLET | Freq: Every day | ORAL | Status: DC
  Administered 2011-11-30: 125 ug via ORAL
  Filled 2011-11-29 (×2): qty 1

## 2011-11-29 NOTE — ED Notes (Signed)
The pt continues to c/o her lower back pain

## 2011-11-29 NOTE — ED Provider Notes (Signed)
History     CSN: [id number]  Arrival date & time 11/29/11  1216   First MD Initiated Contact with Patient 11/29/11 1618      Chief Complaint  Patient presents with  . Nausea  . Emesis  . Back Pain    (Consider location/radiation/quality/duration/timing/severity/associated sxs/prior treatment) Patient is a 71 y.o. female presenting with vomiting. The history is provided by the patient.  Emesis  This is a new problem. The current episode started more than 2 days ago. The problem occurs 2 to 4 times per day. The problem has been gradually worsening. The emesis has an appearance of stomach contents. There has been no fever. Pertinent negatives include no abdominal pain, no chills, no cough, no diarrhea, no fever and no headaches. Risk factors: none.    Past Medical History  Diagnosis Date  . Thyroid disease   . Coronary artery disease   . Hypertension   . Atrial fibrillation with RVR   . Pneumonia     Past Surgical History  Procedure Date  . Coronary stent placement   . Coronary artery bypass graft     History reviewed. No pertinent family history.  History  Substance Use Topics  . Smoking status: Former Games developer  . Smokeless tobacco: Not on file  . Alcohol Use:     OB History    Grav Para Term Preterm Abortions TAB SAB Ect Mult Living                  Review of Systems  Constitutional: Negative for fever and chills.  HENT: Negative for neck pain.   Respiratory: Negative for cough and shortness of breath.   Cardiovascular: Negative for chest pain.  Gastrointestinal: Positive for vomiting. Negative for abdominal pain, diarrhea, constipation and blood in stool.  Genitourinary: Negative for dysuria, urgency, frequency, decreased urine volume and difficulty urinating.  Musculoskeletal: Positive for back pain (chronic left sided back pain that is at baseline).  Neurological: Negative for weakness, numbness and headaches.  All other systems reviewed and are  negative.    Allergies  Review of patient's allergies indicates no known allergies.  Home Medications   Current Outpatient Rx  Name Route Sig Dispense Refill  . ALBUTEROL SULFATE HFA 108 (90 BASE) MCG/ACT IN AERS Inhalation Inhale 2 puffs into the lungs every 4 (four) hours as needed. For shortness of breath    . AMIODARONE HCL 200 MG PO TABS Oral Take 100 mg by mouth every other day.    . ASPIRIN 81 MG PO TABS Oral Take 81 mg by mouth daily.    Marland Kitchen DABIGATRAN ETEXILATE MESYLATE 150 MG PO CAPS Oral Take 150 mg by mouth every 12 (twelve) hours.    Marland Kitchen DIGOXIN 0.125 MG PO TABS Oral Take 125 mcg by mouth daily.    . FUROSEMIDE 40 MG PO TABS Oral Take 40 mg by mouth daily.    . IBANDRONATE SODIUM 150 MG PO TABS Oral Take 150 mg by mouth every 30 (thirty) days. Take in the morning with a full glass of water, on an empty stomach, and do not take anything else by mouth or lie down for the next 30 min. Take on the first week of every month.    Marland Kitchen LEVOTHYROXINE SODIUM 125 MCG PO TABS Oral Take 125 mcg by mouth daily.    Marland Kitchen METOPROLOL TARTRATE 50 MG PO TABS Oral Take 50 mg by mouth 2 (two) times daily.    Marland Kitchen POTASSIUM CHLORIDE CRYS ER 20 MEQ  PO TBCR Oral Take 20 mEq by mouth daily.    Marland Kitchen ROSUVASTATIN CALCIUM 5 MG PO TABS Oral Take 5 mg by mouth daily.      BP 115/64  Pulse 81  Temp 97.9 F (36.6 C) (Oral)  Resp 17  SpO2 95%  Physical Exam  Constitutional: She is oriented to person, place, and time. She appears well-developed and well-nourished. No distress.  HENT:  Head: Normocephalic and atraumatic.  Right Ear: External ear normal.  Left Ear: External ear normal.  Nose: Nose normal.  Mouth/Throat: Oropharynx is clear and moist.  Eyes: Right eye exhibits no discharge. Left eye exhibits no discharge.  Neck: Neck supple.  Cardiovascular: Normal rate, regular rhythm, normal heart sounds and intact distal pulses.   Pulmonary/Chest: Effort normal and breath sounds normal. No respiratory distress.  She has no wheezes. She has no rales.  Abdominal: Soft. She exhibits no distension. There is no tenderness.  Musculoskeletal: She exhibits no edema and no tenderness.       Thoracic back: She exhibits no tenderness.       Lumbar back: She exhibits no tenderness.  Neurological: She is alert and oriented to person, place, and time.  Skin: Skin is warm and dry. She is not diaphoretic. No pallor.    ED Course  Procedures (including critical care time)  Labs Reviewed  CBC WITH DIFFERENTIAL - Abnormal; Notable for the following:    RBC 5.42 (*)     RDW 16.1 (*)     Neutrophils Relative 85 (*)     Neutro Abs 8.0 (*)     Lymphocytes Relative 9 (*)     All other components within normal limits  COMPREHENSIVE METABOLIC PANEL - Abnormal; Notable for the following:    Potassium 3.3 (*)     Glucose, Bld 156 (*)     Creatinine, Ser 1.21 (*)     Total Bilirubin 1.3 (*)     GFR calc non Af Amer 44 (*)     GFR calc Af Amer 51 (*)     All other components within normal limits  URINALYSIS, ROUTINE W REFLEX MICROSCOPIC - Abnormal; Notable for the following:    APPearance HAZY (*)     Bilirubin Urine SMALL (*)     Protein, ur 100 (*)     Urobilinogen, UA 4.0 (*)     Leukocytes, UA TRACE (*)     All other components within normal limits  URINE MICROSCOPIC-ADD ON - Abnormal; Notable for the following:    Squamous Epithelial / LPF MANY (*)     Bacteria, UA FEW (*)     Casts GRANULAR CAST (*)  HYALINE CASTS   All other components within normal limits  LIPASE, BLOOD  TROPONIN I  DIGOXIN LEVEL   Dg Abd Acute W/chest  11/29/2011  *RADIOLOGY REPORT*  Clinical Data: Vomiting for 5 days, history coronary disease post CABG, hypertension, atrial fibrillation  ACUTE ABDOMEN SERIES (ABDOMEN 2 VIEW & CHEST 1 VIEW)  Comparison: Chest radiograph 04/18/2010  Findings: Borderline enlargement of cardiac silhouette post CABG. Atherosclerotic calcification aorta. Mediastinal contours and pulmonary vascularity  otherwise normal. Emphysematous changes without infiltrate, pleural effusion, or pneumothorax. Question vague nodular density left upper lobe 13 x 9 mm. Osseous demineralization. Levoconvex thoracolumbar scoliosis with multilevel degenerative changes.  Extensive atherosclerotic calcifications of the iliac arteries with wall stents at left common iliac and left external iliac arteries. Normal bowel gas pattern. No bowel dilatation, bowel wall thickening or free intraperitoneal air. Mildly  prominent stool throughout colon. Question splenic enlargement.  IMPRESSION: Changes of COPD with a questionable vague nodular density in the left upper lobe 13 x 9 mm; CT chest recommended to exclude pulmonary nodule. Mildly prominent stool within colon. Question mild splenomegaly.   Original Report Authenticated By: Lollie Marrow, M.D.      1. Nausea   2. EKG abnormalities   3. Atrial fibrillation   4. CAD (coronary artery disease)   5. HTN (hypertension)       MDM  71 yo female with intermittent nausea and vomiting, worse after food over past few days. No chest pain, dyspnea or abd pain. No fevers or urinary symptoms. No GERD like symptoms. Has chronic back pain that has not worsened during this time. Denies bad food ingestion. Labs benign except mild increased Cr. EKG shows new ST depressions, and last time patient had coronary issues she had only nausea and vomiting. Due to these changes with atypical symptoms, will admit to hospitalist for rule out and further diagnostic workup.         Pricilla Loveless, MD 11/30/11 [phone]

## 2011-11-29 NOTE — ED Notes (Addendum)
Pt reports nausea and vomiting since Saturday. Unable to keep food or meds down. Denies abdominal pain. Reports lower back pain that started while driving here.

## 2011-11-29 NOTE — ED Notes (Signed)
Iv started with 500 ml ns bolus

## 2011-11-29 NOTE — ED Notes (Signed)
Pt going to xray  

## 2011-11-29 NOTE — ED Notes (Signed)
The pts nss bolus has infused.  Iv continued at 145ml/hr.  The pt remains alert no distress

## 2011-11-29 NOTE — ED Notes (Signed)
Report called to 2000 

## 2011-11-29 NOTE — ED Notes (Signed)
The pt reports that she has had lower back pain for years and she has flare-ups intermittently.  This is one of her flare-ups.  Alert steady gait no acute distress

## 2011-11-29 NOTE — H&P (Signed)
[name] is an 71 y.o. female.     Patient was seen and examined on November 29, 2011 at 8:10 PM. PCP - Dr. Dewain Penning. Cardiologist - Dr. Herbie Baltimore of [facility].  Chief Complaint: Nausea vomiting. HPI: 71 year-old female with history of CAD status post CABG in 2009 with last EF measured was 40-45%, hypertension, hyperlipidemia, atrial fibrillation on Pradaxa presents with complains of nausea and vomiting. Patient has been experiencing nausea and vomiting for the last 3-4 days. Patient is unable to eat anything because of the nausea vomiting. Denies abdominal pain or diarrhea. Denies any chest pain or shortness of breath. In the ER EKG is unremarkable with cardiac enzymes negative and LFTs and lipase are normal acute abdominal series unremarkable in digoxin level was normal has been admitted for further management.  Past Medical History  Diagnosis Date  . Thyroid disease   . Coronary artery disease   . Hypertension   . Atrial fibrillation with RVR   . Pneumonia     Past Surgical History  Procedure Date  . Coronary stent placement   . Coronary artery bypass graft     Family History  Problem Relation Age of Onset  . Stroke Mother    Social History:  reports that she has quit smoking. She does not have any smokeless tobacco history on file. She reports that she drinks alcohol. She reports that she does not use illicit drugs.  Allergies: No Known Allergies   (Not in a hospital admission)  Results for orders placed during the hospital encounter of 11/29/11 (from the past 48 hour(s))  CBC WITH DIFFERENTIAL     Status: Abnormal   Collection Time   11/29/11 12:31 PM      Component Value Range Comment   WBC 9.5  4.0 - 10.5 K/uL    RBC 5.42 (*) 3.87 - 5.11 MIL/uL    Hemoglobin 14.4  12.0 - 15.0 g/dL    HCT 16.1  09.6 - 04.5 %    MCV 81.0  78.0 - 100.0 fL    MCH 26.6  26.0 - 34.0 pg    MCHC 32.8  30.0 - 36.0 g/dL    RDW 40.9 (*) 81.1 - 15.5 %    Platelets  230  150 - 400 K/uL    Neutrophils Relative 85 (*) 43 - 77 %    Neutro Abs 8.0 (*) 1.7 - 7.7 K/uL    Lymphocytes Relative 9 (*) 12 - 46 %    Lymphs Abs 0.8  0.7 - 4.0 K/uL    Monocytes Relative 6  3 - 12 %    Monocytes Absolute 0.5  0.1 - 1.0 K/uL    Eosinophils Relative 1  0 - 5 %    Eosinophils Absolute 0.1  0.0 - 0.7 K/uL    Basophils Relative 0  0 - 1 %    Basophils Absolute 0.0  0.0 - 0.1 K/uL   COMPREHENSIVE METABOLIC PANEL     Status: Abnormal   Collection Time   11/29/11 12:31 PM      Component Value Range Comment   Sodium 140  135 - 145 mEq/L    Potassium 3.3 (*) 3.5 - 5.1 mEq/L    Chloride 96  96 - 112 mEq/L    CO2 28  19 - 32 mEq/L    Glucose, Bld 156 (*) 70 - 99 mg/dL    BUN 22  6 - 23 mg/dL    Creatinine, Ser 9.14 (*)  0.50 - 1.10 mg/dL    Calcium 16.1  8.4 - 10.5 mg/dL    Total Protein 7.8  6.0 - 8.3 g/dL    Albumin 4.6  3.5 - 5.2 g/dL    AST 29  0 - 37 U/L    ALT 15  0 - 35 U/L    Alkaline Phosphatase 74  39 - 117 U/L    Total Bilirubin 1.3 (*) 0.3 - 1.2 mg/dL    GFR calc non Af Amer 44 (*) >90 mL/min    GFR calc Af Amer 51 (*) >90 mL/min   URINALYSIS, ROUTINE W REFLEX MICROSCOPIC     Status: Abnormal   Collection Time   11/29/11 12:41 PM      Component Value Range Comment   Color, Urine YELLOW  YELLOW    APPearance HAZY (*) CLEAR    Specific Gravity, Urine 1.014  1.005 - 1.030    pH 6.5  5.0 - 8.0    Glucose, UA NEGATIVE  NEGATIVE mg/dL    Hgb urine dipstick NEGATIVE  NEGATIVE    Bilirubin Urine SMALL (*) NEGATIVE    Ketones, ur NEGATIVE  NEGATIVE mg/dL    Protein, ur 096 (*) NEGATIVE mg/dL    Urobilinogen, UA 4.0 (*) 0.0 - 1.0 mg/dL    Nitrite NEGATIVE  NEGATIVE    Leukocytes, UA TRACE (*) NEGATIVE   URINE MICROSCOPIC-ADD ON     Status: Abnormal   Collection Time   11/29/11 12:41 PM      Component Value Range Comment   Squamous Epithelial / LPF MANY (*) RARE    WBC, UA 3-6  <3 WBC/hpf    RBC / HPF 0-2  <3 RBC/hpf    Bacteria, UA FEW (*) RARE     Casts GRANULAR CAST (*) NEGATIVE HYALINE CASTS  LIPASE, BLOOD     Status: Normal   Collection Time   11/29/11  5:13 PM      Component Value Range Comment   Lipase 32  11 - 59 U/L   TROPONIN I     Status: Normal   Collection Time   11/29/11  5:13 PM      Component Value Range Comment   Troponin I <0.30  <0.30 ng/mL   DIGOXIN LEVEL     Status: Normal   Collection Time   11/29/11  5:13 PM      Component Value Range Comment   Digoxin Level 1.7  0.8 - 2.0 ng/mL    Dg Abd Acute W/chest  11/29/2011  *RADIOLOGY REPORT*  Clinical Data: Vomiting for 5 days, history coronary disease post CABG, hypertension, atrial fibrillation  ACUTE ABDOMEN SERIES (ABDOMEN 2 VIEW & CHEST 1 VIEW)  Comparison: Chest radiograph 04/18/2010  Findings: Borderline enlargement of cardiac silhouette post CABG. Atherosclerotic calcification aorta. Mediastinal contours and pulmonary vascularity otherwise normal. Emphysematous changes without infiltrate, pleural effusion, or pneumothorax. Question vague nodular density left upper lobe 13 x 9 mm. Osseous demineralization. Levoconvex thoracolumbar scoliosis with multilevel degenerative changes.  Extensive atherosclerotic calcifications of the iliac arteries with wall stents at left common iliac and left external iliac arteries. Normal bowel gas pattern. No bowel dilatation, bowel wall thickening or free intraperitoneal air. Mildly prominent stool throughout colon. Question splenic enlargement.  IMPRESSION: Changes of COPD with a questionable vague nodular density in the left upper lobe 13 x 9 mm; CT chest recommended to exclude pulmonary nodule. Mildly prominent stool within colon. Question mild splenomegaly.   Original Report Authenticated By: Lollie Marrow,  M.D.     Review of Systems  Constitutional: Negative.   HENT: Negative.   Eyes: Negative.   Respiratory: Negative.   Cardiovascular: Negative.   Gastrointestinal: Positive for nausea and vomiting.  Genitourinary: Negative.     Musculoskeletal: Negative.   Skin: Negative.   Neurological: Negative.   Endo/Heme/Allergies: Negative.   Psychiatric/Behavioral: Negative.     Blood pressure 104/70, pulse 78, temperature 98.4 F (36.9 C), temperature source Oral, resp. rate 18, SpO2 98.00%. Physical Exam  Constitutional: She is oriented to person, place, and time. She appears well-developed and well-nourished. No distress.  HENT:  Head: Normocephalic and atraumatic.  Right Ear: External ear normal.  Left Ear: External ear normal.  Mouth/Throat: No oropharyngeal exudate.  Eyes: Conjunctivae are normal. Pupils are equal, round, and reactive to light. Right eye exhibits no discharge. Left eye exhibits no discharge. No scleral icterus.  Neck: Normal range of motion. Neck supple.  Cardiovascular: Normal rate and regular rhythm.   Respiratory: Effort normal and breath sounds normal. No respiratory distress. She has no wheezes. She has no rales.  GI: Soft. Bowel sounds are normal. She exhibits no distension. There is no tenderness. There is no rebound.  Musculoskeletal: Normal range of motion. She exhibits no edema and no tenderness.  Neurological: She is alert and oriented to person, place, and time.       Moves all extremities.  Skin: Skin is warm and dry. She is not diaphoretic.  Psychiatric: Her behavior is normal.     Assessment/Plan #1. Nausea and vomiting - given the history of CAD we will cycle cardiac markers. Recheck digoxin level again in a.m. Check sonogram of the abdomen for gallstones. Place patient on clear liquid diet for now. #2. CAD status post CABG - see #1. #3. Hypertension - continue present medications. #4. Atrial fibrillation rate controlled - continue present medications. Recheck digoxin level in a.m. Patient is on Pradaxa. #5. Hypothyroidism - check TSH.  CODE STATUS - full code.  Eduard Clos 11/29/2011, 8:35 PM

## 2011-11-30 ENCOUNTER — Inpatient Hospital Stay (HOSPITAL_COMMUNITY): Payer: Medicare HMO

## 2011-11-30 LAB — COMPREHENSIVE METABOLIC PANEL
AST: 21 U/L (ref 0–37)
Albumin: 3.7 g/dL (ref 3.5–5.2)
BUN: 21 mg/dL (ref 6–23)
Calcium: 8.1 mg/dL — ABNORMAL LOW (ref 8.4–10.5)
Chloride: 101 mEq/L (ref 96–112)
Creatinine, Ser: 1.08 mg/dL (ref 0.50–1.10)
Total Bilirubin: 1 mg/dL (ref 0.3–1.2)
Total Protein: 6.2 g/dL (ref 6.0–8.3)

## 2011-11-30 LAB — CBC WITH DIFFERENTIAL/PLATELET
Basophils Absolute: 0 10*3/uL (ref 0.0–0.1)
Eosinophils Relative: 2 % (ref 0–5)
HCT: 35.8 % — ABNORMAL LOW (ref 36.0–46.0)
Hemoglobin: 11.4 g/dL — ABNORMAL LOW (ref 12.0–15.0)
Lymphocytes Relative: 14 % (ref 12–46)
Lymphs Abs: 0.9 10*3/uL (ref 0.7–4.0)
MCV: 81.7 fL (ref 78.0–100.0)
Monocytes Absolute: 0.5 10*3/uL (ref 0.1–1.0)
Monocytes Relative: 8 % (ref 3–12)
Neutro Abs: 4.6 10*3/uL (ref 1.7–7.7)
RBC: 4.38 MIL/uL (ref 3.87–5.11)
WBC: 6.1 10*3/uL (ref 4.0–10.5)

## 2011-11-30 LAB — DIGOXIN LEVEL: Digoxin Level: 1.4 ng/mL (ref 0.8–2.0)

## 2011-11-30 LAB — TROPONIN I
Troponin I: <0.3 ng/mL (ref <0.30)
Troponin I: <0.3 ng/mL (ref <0.30)

## 2011-11-30 MED ORDER — AMIODARONE HCL 100 MG PO TABS
100.0000 mg | ORAL_TABLET | ORAL | Status: DC
  Administered 2011-11-30: 100 mg via ORAL
  Filled 2011-11-30: qty 1

## 2011-11-30 MED ORDER — POTASSIUM CHLORIDE CRYS ER 20 MEQ PO TBCR
40.0000 meq | EXTENDED_RELEASE_TABLET | Freq: Once | ORAL | Status: AC
  Administered 2011-11-30: 40 meq via ORAL
  Filled 2011-11-30: qty 2

## 2011-11-30 NOTE — Care Management Note (Unsigned)
    Page 1 of 1   11/30/2011     11:15:36 AM   CARE MANAGEMENT NOTE 11/30/2011  Patient:  [name], [name]   Account Number:  [id number]  Date Initiated:  11/30/2011  Documentation initiated by:  SIMMONS,Vishruth Seoane  Subjective/Objective Assessment:   ADMITTED WITH N/V; LIVES AT HOME WITH DAUGHTER AND GRANDSON; WAS IPTA, NO DME; USES [facility] [address] FOR RX.     Action/Plan:   DISCHARGE PLANNING DISCUSSED AT BEDSIDE.   Anticipated DC Date:  11/30/2011   Anticipated DC Plan:  HOME/[facility]      DC Planning Services  CM consult      Choice offered to / List presented to:             Status of service:  In process, will continue to follow Medicare Important Message given?   (If response is "NO", the following Medicare IM given date fields will be blank) Date Medicare IM given:   Date Additional Medicare IM given:    Discharge Disposition:    Per UR Regulation:  Reviewed for med. necessity/level of care/duration of stay  If discussed at Long Length of Stay Meetings, dates discussed:    Comments:  11/30/11  1114  Keishawn Darsey SIMMONS RN, BSN [phone] NCM WILL FOLLOW.

## 2011-11-30 NOTE — Discharge Summary (Signed)
Physician Discharge Summary  Patient ID: [name] MRN: [id number] DOB/AGE: 1940/12/24 71 y.o.  Admit date: 11/29/2011 Discharge date: 11/30/2011  Primary Care Physician:  Herb Grays, MD   Discharge Diagnoses:    Principal Problem:  *Nausea and vomiting Active Problems:  CAD (coronary artery disease)  Atrial fibrillation  Hyperlipidemia  HTN (hypertension)  Hypothyroidism    Medication List  As of 11/30/2011  1:24 PM   TAKE these medications         albuterol 108 (90 BASE) MCG/ACT inhaler   Commonly known as: PROVENTIL HFA;VENTOLIN HFA   Inhale 2 puffs into the lungs every 4 (four) hours as needed. For shortness of breath      amiodarone 200 MG tablet   Commonly known as: PACERONE   Take 100 mg by mouth every other day.      aspirin 81 MG tablet   Take 81 mg by mouth daily.      digoxin 0.125 MG tablet   Commonly known as: LANOXIN   Take 125 mcg by mouth daily.      furosemide 40 MG tablet   Commonly known as: LASIX   Take 40 mg by mouth daily.      ibandronate 150 MG tablet   Commonly known as: BONIVA   Take 150 mg by mouth every 30 (thirty) days. Take in the morning with a full glass of water, on an empty stomach, and do not take anything else by mouth or lie down for the next 30 min. Take on the first week of every month.      levothyroxine 125 MCG tablet   Commonly known as: SYNTHROID, LEVOTHROID   Take 125 mcg by mouth daily.      metoprolol 50 MG tablet   Commonly known as: LOPRESSOR   Take 50 mg by mouth 2 (two) times daily.      potassium chloride SA 20 MEQ tablet   Commonly known as: K-DUR,KLOR-CON   Take 20 mEq by mouth daily.      PRADAXA 150 MG Caps   Generic drug: dabigatran   Take 150 mg by mouth every 12 (twelve) hours.      rosuvastatin 5 MG tablet   Commonly known as: CRESTOR   Take 5 mg by mouth daily.             Disposition and Follow-up:  Will be discharged home today in stable and improved condition. Instructed to  followup with her PCP in 2 weeks.  Consults:  None    Significant Diagnostic Studies:  US Abdomen Complete  11/30/2011  *RADIOLOGY REPORT*  Clinical Data:  Nausea and vomiting.  Question cholelithiasis.  COMPLETE ABDOMINAL ULTRASOUND  Comparison:  Renal ultrasound 07/27/2007.  Abdominal CT 07/27/2007.  Findings:  Gallbladder: Well distended without wall thickening, stones or pericholecystic fluid. Negative sonographic Murphy's sign.  Common bile duct:   Normal in caliber without filling defects.  Liver:  Echogenicity is within normal limits.  No focal hepatic abnormalities are identified.  IVC:  Visualized portions appear unremarkable.  Pancreas:  Visualized portions appear unremarkable.Significant portions of the pancreas are obscured by bowel gas.  Spleen:  Visualized portions appear unremarkable.  Right Kidney:  The cortical echogenicity appears mildly increased. The renal thickness is preserved.  There is no hydronephrosis or focal abnormality. Renal length is 11.5 cm.  Left Kidney: There is a probable 11 mm cyst in the upper pole. There is no hydronephrosis or other focal abnormality. Renal length is 10.9 cm.  Abdominal aorta:  Visualized portions appear unremarkable.  IMPRESSION:  1.  No acute abdominal findings.  No evidence of cholelithiasis. 2.  Both kidneys demonstrate mildly increased cortical echogenicity.  There is no hydronephrosis.   Original Report Authenticated By: Gerrianne Scale, M.D.    Dg Abd Acute W/chest  11/29/2011  *RADIOLOGY REPORT*  Clinical Data: Vomiting for 5 days, history coronary disease post CABG, hypertension, atrial fibrillation  ACUTE ABDOMEN SERIES (ABDOMEN 2 VIEW & CHEST 1 VIEW)  Comparison: Chest radiograph 04/18/2010  Findings: Borderline enlargement of cardiac silhouette post CABG. Atherosclerotic calcification aorta. Mediastinal contours and pulmonary vascularity otherwise normal. Emphysematous changes without infiltrate, pleural effusion, or pneumothorax.  Question vague nodular density left upper lobe 13 x 9 mm. Osseous demineralization. Levoconvex thoracolumbar scoliosis with multilevel degenerative changes.  Extensive atherosclerotic calcifications of the iliac arteries with wall stents at left common iliac and left external iliac arteries. Normal bowel gas pattern. No bowel dilatation, bowel wall thickening or free intraperitoneal air. Mildly prominent stool throughout colon. Question splenic enlargement.  IMPRESSION: Changes of COPD with a questionable vague nodular density in the left upper lobe 13 x 9 mm; CT chest recommended to exclude pulmonary nodule. Mildly prominent stool within colon. Question mild splenomegaly.   Original Report Authenticated By: Lollie Marrow, M.D.     Brief H and P: For complete details please refer to admission H and P, but in brief patient is a 71 year-old female with history of CAD status post CABG in 2009 with last EF measured was 40-45%, hypertension, hyperlipidemia, atrial fibrillation on Pradaxa presents with complains of nausea and vomiting. Patient has been experiencing nausea and vomiting for the last 3-4 days. Patient is unable to eat anything because of the nausea and vomiting. Denies abdominal pain or diarrhea. Denies any chest pain or shortness of breath. In the ER EKG is unremarkable with cardiac enzymes negative and LFTs and lipase are normal, acute abdominal series unremarkable and a digoxin level was normal. We were asked to admit her for further evaluation or management.    Hospital Course:  Principal Problem:  *Nausea and vomiting Active Problems:  CAD (coronary artery disease)  Atrial fibrillation  Hyperlipidemia  HTN (hypertension)  Hypothyroidism   #1 Nausea/vomiting: Resolved. Unclear etiology. Has tolerated 2 solid meals and has been ambulating down the hallways without complaint.  All rest of her chronic medical issues have been stable this hospitalization and her home medications have not  been changed.  Time spent on Discharge: Greater than 30 minutes.  SignedChaya Jan [facility] Pager: [phone] 11/30/2011, 1:24 PM

## 2011-11-30 NOTE — Progress Notes (Signed)
Pt tolerated breakfast this AM; no c/o nausea; will cont. To monitor.

## 2011-11-30 NOTE — Clinical Documentation Improvement (Signed)
GENERIC DOCUMENTATION CLARIFICATION QUERY  THIS DOCUMENT IS NOT A PERMANENT PART OF THE MEDICAL RECORD  TO RESPOND TO THE THIS QUERY, FOLLOW THE INSTRUCTIONS BELOW:  1. If needed, update documentation for the patient's encounter via the notes activity.  2. Access this query again and click edit on the In [employer].  3. After updating, or not, click F2 to complete all highlighted (required) fields concerning your review. Select "additional documentation in the medical record" OR "no additional documentation provided".  4. Click Sign note button.  5. The deficiency will fall out of your In Basket *Please let us know if you are not able to complete this workflow by phone or e-mail (listed below).  Please update your documentation within the medical record to reflect your response to this query.                                                                                        11/30/11   Dear Dr.Hernandez / Associates,  In a better effort to capture your patient's severity of illness, reflect appropriate length of stay and utilization of resources, a review of the patient medical record has revealed the following indicators.    Based on your clinical judgment, please clarify and document in a progress note and/or discharge summary the clinical condition associated with the following supporting information:  In responding to this query please exercise your independent judgment.  The fact that a query is asked, does not imply that any particular answer is desired or expected.   Possible Clinical Conditions?  _______Hypokalemia   _______Other Condition  _______Cannot Clinically Determine    Diagnostics: 8/28: potassium=3.3 8/29: potassium=3.0  Treatment: 8/29: potassium chloride po  You may use possible, probable, or suspect with inpatient documentation. possible, probable, suspected diagnoses MUST be documented at the time of discharge  Reviewed:No additional  documentation provided.  Thank You,  Marciano Sequin,  Clinical Documentation Specialist:  Pager: [phone]  Health Information Management [place]

## 2011-11-30 NOTE — Progress Notes (Signed)
Pt up ambulating in hallway independently at this time without difficulty; will cont. To monitor. 

## 2011-11-30 NOTE — Progress Notes (Signed)
K 3.0 this AM; Dr. Ardyth Harps paged to make aware; will await callback.

## 2011-12-01 NOTE — ED Provider Notes (Signed)
I saw and evaluated the patient, reviewed the resident's note and I agree with the findings and plan.  Aireanna Luellen T Mccade Sullenberger, MD 12/01/11 2344 

## 2012-04-01 ENCOUNTER — Other Ambulatory Visit: Payer: Self-pay | Admitting: Cardiovascular Disease

## 2012-04-01 ENCOUNTER — Ambulatory Visit
Admission: RE | Admit: 2012-04-01 | Discharge: 2012-04-01 | Disposition: A | Discharge status: Home / Self Care | Payer: Medicare HMO | Source: Ambulatory Visit | Attending: Cardiovascular Disease | Admitting: Cardiovascular Disease

## 2012-04-01 DIAGNOSIS — I4891 Unspecified atrial fibrillation: Secondary | ICD-10-CM

## 2012-04-05 ENCOUNTER — Encounter (HOSPITAL_COMMUNITY): Payer: Self-pay | Admitting: Anesthesiology

## 2012-04-05 ENCOUNTER — Encounter (HOSPITAL_COMMUNITY): Payer: Self-pay | Admitting: *Deleted

## 2012-04-05 ENCOUNTER — Ambulatory Visit (HOSPITAL_COMMUNITY): Payer: Medicare HMO | Admitting: Anesthesiology

## 2012-04-05 ENCOUNTER — Ambulatory Visit (HOSPITAL_COMMUNITY)
Admission: RE | Admit: 2012-04-05 | Discharge: 2012-04-05 | Disposition: A | Discharge status: Home / Self Care | Payer: Medicare HMO | Source: Ambulatory Visit | Attending: Cardiovascular Disease | Admitting: Cardiovascular Disease

## 2012-04-05 ENCOUNTER — Encounter (HOSPITAL_COMMUNITY)
Admission: RE | Disposition: A | Discharge status: Home / Self Care | Payer: Self-pay | Source: Ambulatory Visit | Attending: Cardiovascular Disease

## 2012-04-05 DIAGNOSIS — I4891 Unspecified atrial fibrillation: Secondary | ICD-10-CM | POA: Insufficient documentation

## 2012-04-05 DIAGNOSIS — I4819 Other persistent atrial fibrillation: Secondary | ICD-10-CM

## 2012-04-05 DIAGNOSIS — I10 Essential (primary) hypertension: Secondary | ICD-10-CM | POA: Insufficient documentation

## 2012-04-05 HISTORY — DX: Other persistent atrial fibrillation: I48.19

## 2012-04-05 HISTORY — DX: Anxiety disorder, unspecified: F41.9

## 2012-04-05 HISTORY — PX: CARDIOVERSION: SHX1299

## 2012-04-05 SURGERY — CARDIOVERSION
Anesthesia: General

## 2012-04-05 MED ORDER — SODIUM CHLORIDE 0.9 % IJ SOLN: 3.0000 mL | INTRAMUSCULAR | Status: DC | PRN

## 2012-04-05 MED ORDER — SODIUM CHLORIDE 0.9 % IV SOLN: INTRAVENOUS | Status: DC

## 2012-04-05 MED ORDER — PROPOFOL 10 MG/ML IV BOLUS
INTRAVENOUS | Status: DC | PRN
  Administered 2012-04-05: 50 mg via INTRAVENOUS

## 2012-04-05 MED ORDER — LIDOCAINE HCL (CARDIAC) 20 MG/ML IV SOLN
INTRAVENOUS | Status: DC | PRN
  Administered 2012-04-05: 40 mg via INTRAVENOUS

## 2012-04-05 MED ORDER — HYDROCORTISONE 1 % EX CREA: 1.0000 "application " | TOPICAL_CREAM | Freq: Three times a day (TID) | CUTANEOUS | Status: DC | PRN

## 2012-04-05 MED ORDER — SODIUM CHLORIDE 0.9 % IV SOLN
INTRAVENOUS | Status: DC | PRN
  Administered 2012-04-05: 13:00:00 via INTRAVENOUS

## 2012-04-05 NOTE — Transfer of Care (Signed)
Immediate Anesthesia Transfer of Care Note  Patient: [name]  Procedure(s) Performed: Procedure(s) (LRB) with comments: CARDIOVERSION (N/A)  Patient Location: Endoscopy Unit  Anesthesia Type:General  Level of Consciousness: awake, alert  and oriented  Airway & Oxygen Therapy: Patient Spontanous Breathing and Patient connected to nasal cannula oxygen  Post-op Assessment: Report given to PACU RN and Post -op Vital signs reviewed and stable  Post vital signs: Reviewed and stable  Complications: No apparent anesthesia complications

## 2012-04-05 NOTE — CV Procedure (Signed)
[name], Erdman Female, 72 y.o., [birth year]  Location: [facility] ENDOSCOPY  MRN: [id number]  CSN: [id number]  Admit Dt: 04/05/12 . Procedure: Electrical Cardioversion Indications:  Atrial Fibrillation  Procedure Details:  Consent: Risks of procedure as well as the alternatives and risks of each were explained to the (patient/caregiver).  Consent for procedure obtained.  Time Out: Verified patient identification, verified procedure, site/side was marked, verified correct patient position, special equipment/implants available, medications/allergies/relevent history reviewed, required imaging and test results available.  Performed  Patient placed on cardiac monitor, pulse oximetry, supplemental oxygen as necessary.  Sedation given: propofol IV, Dr. Durene Romans Pacer pads placed anterior and posterior chest.  Cardioverted 1 time(s).  Cardioverted at 120J.  Evaluation: Findings: Post procedure EKG shows: NSR with very frequent PACs Complications: None Patient did tolerate procedure well.  Time Spent Directly with the Patient:  30 minutes   Cambrey Lupi 04/05/2012, 1:37 PM

## 2012-04-05 NOTE — Anesthesia Preprocedure Evaluation (Addendum)
Anesthesia Evaluation  Patient identified by MRN, date of birth, ID band Patient awake    Reviewed: Allergy & Precautions, H&P , NPO status , Patient's Chart, lab work & pertinent test results  History of Anesthesia Complications Negative for: history of anesthetic complications  Airway Mallampati: I TM Distance: >3 FB Neck ROM: Full    Dental  (+) Teeth Intact, Partial Upper, Dental Advisory Given and Partial Lower   Pulmonary former smoker,  breath sounds clear to auscultation        Cardiovascular hypertension, Pt. on medications and Pt. on home beta blockers + CAD + dysrhythmias Atrial Fibrillation Rhythm:Irregular Rate:Normal     Neuro/Psych PSYCHIATRIC DISORDERS Anxiety    GI/Hepatic negative GI ROS, Neg liver ROS,   Endo/Other  Hypothyroidism   Renal/GU negative Renal ROS     Musculoskeletal negative musculoskeletal ROS (+)   Abdominal   Peds  Hematology negative hematology ROS (+)   Anesthesia Other Findings   Reproductive/Obstetrics                         Anesthesia Physical Anesthesia Plan  ASA: III  Anesthesia Plan: General   Post-op Pain Management:    Induction: Intravenous  Airway Management Planned: Mask  Additional Equipment:   Intra-op Plan:   Post-operative Plan:   Informed Consent: I have reviewed the patients History and Physical, chart, labs and discussed the procedure including the risks, benefits and alternatives for the proposed anesthesia with the patient or authorized representative who has indicated his/her understanding and acceptance.     Plan Discussed with: CRNA and Surgeon  Anesthesia Plan Comments:         Anesthesia Quick Evaluation

## 2012-04-05 NOTE — H&P (Signed)
Date of Initial H&P: 03/26/12  History reviewed, patient examined, no change in status, stable for surgery. Elective DC cardioversion for symptomatic AF. This procedure has been fully reviewed with the patient and written informed consent has been obtained. Thurmon Fair, MD, [facility] [phone] office [phone] pager 04/05/2012

## 2012-04-05 NOTE — Anesthesia Postprocedure Evaluation (Signed)
  Anesthesia Post-op Note  Patient: [name]  Procedure(s) Performed: Procedure(s) (LRB) with comments: CARDIOVERSION (N/A)  Patient Location: Endoscopy Unit  Anesthesia Type:General  Level of Consciousness: awake, alert  and oriented  Airway and Oxygen Therapy: Patient Spontanous Breathing  Post-op Pain: none  Post-op Assessment: Post-op Vital signs reviewed, Patient's Cardiovascular Status Stable, Respiratory Function Stable, Patent Airway, No signs of Nausea or vomiting, Adequate PO intake, Pain level controlled, No headache, No backache, No residual numbness and No residual motor weakness  Post-op Vital Signs: Reviewed and stable  Complications: No apparent anesthesia complications

## 2012-04-08 ENCOUNTER — Encounter (HOSPITAL_COMMUNITY): Payer: Self-pay | Admitting: Cardiovascular Disease

## 2012-06-25 ENCOUNTER — Emergency Department (HOSPITAL_COMMUNITY): Payer: Medicare HMO

## 2012-06-25 ENCOUNTER — Encounter (HOSPITAL_COMMUNITY): Payer: Self-pay | Admitting: Emergency Medicine

## 2012-06-25 ENCOUNTER — Emergency Department (HOSPITAL_COMMUNITY)
Admission: EM | Admit: 2012-06-25 | Discharge: 2012-06-26 | Disposition: A | Discharge status: Home / Self Care | Payer: Medicare HMO | Attending: Emergency Medicine | Admitting: Emergency Medicine

## 2012-06-25 DIAGNOSIS — Z87891 Personal history of nicotine dependence: Secondary | ICD-10-CM | POA: Insufficient documentation

## 2012-06-25 DIAGNOSIS — I2510 Atherosclerotic heart disease of native coronary artery without angina pectoris: Secondary | ICD-10-CM | POA: Insufficient documentation

## 2012-06-25 DIAGNOSIS — R5381 Other malaise: Secondary | ICD-10-CM | POA: Insufficient documentation

## 2012-06-25 DIAGNOSIS — R42 Dizziness and giddiness: Secondary | ICD-10-CM | POA: Insufficient documentation

## 2012-06-25 DIAGNOSIS — Z79899 Other long term (current) drug therapy: Secondary | ICD-10-CM | POA: Insufficient documentation

## 2012-06-25 DIAGNOSIS — R350 Frequency of micturition: Secondary | ICD-10-CM | POA: Insufficient documentation

## 2012-06-25 DIAGNOSIS — Z9861 Coronary angioplasty status: Secondary | ICD-10-CM | POA: Insufficient documentation

## 2012-06-25 DIAGNOSIS — R110 Nausea: Secondary | ICD-10-CM | POA: Insufficient documentation

## 2012-06-25 DIAGNOSIS — Z951 Presence of aortocoronary bypass graft: Secondary | ICD-10-CM | POA: Insufficient documentation

## 2012-06-25 DIAGNOSIS — I10 Essential (primary) hypertension: Secondary | ICD-10-CM | POA: Insufficient documentation

## 2012-06-25 DIAGNOSIS — E079 Disorder of thyroid, unspecified: Secondary | ICD-10-CM | POA: Insufficient documentation

## 2012-06-25 DIAGNOSIS — Z7982 Long term (current) use of aspirin: Secondary | ICD-10-CM | POA: Insufficient documentation

## 2012-06-25 DIAGNOSIS — Z8701 Personal history of pneumonia (recurrent): Secondary | ICD-10-CM | POA: Insufficient documentation

## 2012-06-25 DIAGNOSIS — I4891 Unspecified atrial fibrillation: Secondary | ICD-10-CM

## 2012-06-25 DIAGNOSIS — F411 Generalized anxiety disorder: Secondary | ICD-10-CM | POA: Insufficient documentation

## 2012-06-25 LAB — URINE MICROSCOPIC-ADD ON

## 2012-06-25 LAB — CBC WITH DIFFERENTIAL/PLATELET
Basophils Relative: 0 % (ref 0–1)
Eosinophils Absolute: 0.1 10*3/uL (ref 0.0–0.7)
MCH: 25.6 pg — ABNORMAL LOW (ref 26.0–34.0)
MCHC: 32.2 g/dL (ref 30.0–36.0)
Neutrophils Relative %: 74 % (ref 43–77)
Platelets: 185 10*3/uL (ref 150–400)
RDW: 16.8 % — ABNORMAL HIGH (ref 11.5–15.5)

## 2012-06-25 LAB — POCT I-STAT TROPONIN I
Troponin i, poc: 0 ng/mL (ref 0.00–0.08)
Troponin i, poc: 0 ng/mL (ref 0.00–0.08)

## 2012-06-25 LAB — COMPREHENSIVE METABOLIC PANEL
ALT: 16 U/L (ref 0–35)
Albumin: 4.4 g/dL (ref 3.5–5.2)
Alkaline Phosphatase: 67 U/L (ref 39–117)
Potassium: 4 mEq/L (ref 3.5–5.1)
Sodium: 138 mEq/L (ref 135–145)
Total Protein: 7.5 g/dL (ref 6.0–8.3)

## 2012-06-25 LAB — URINALYSIS, ROUTINE W REFLEX MICROSCOPIC
Glucose, UA: NEGATIVE mg/dL
Hgb urine dipstick: NEGATIVE
Specific Gravity, Urine: 1.028 (ref 1.005–1.030)
Urobilinogen, UA: 1 mg/dL (ref 0.0–1.0)

## 2012-06-25 LAB — DIGOXIN LEVEL: Digoxin Level: 1.3 ng/mL (ref 0.8–2.0)

## 2012-06-25 NOTE — ED Provider Notes (Signed)
History     CSN: [id number]  Arrival date & time 06/25/12  2024   First MD Initiated Contact with Patient 06/25/12 2153      Chief Complaint  Patient presents with  . Back Pain  . Nausea     Patient is a 72 y.o. female presenting with weakness. The history is provided by the patient.  Weakness This is a new problem. The current episode started yesterday. The problem occurs constantly. The problem has been gradually worsening. Pertinent negatives include no chest pain, no abdominal pain, no headaches and no shortness of breath. Nothing aggravates the symptoms. Nothing relieves the symptoms. She has tried rest for the symptoms. The treatment provided mild relief.  pt reports since yesterday she has nausea, fatigue, lightheaded No vomiting/diarrhea No cp/sob No focal weakness No slurred speech No abdominal pain No back pain   Past Medical History  Diagnosis Date  . Thyroid disease   . Coronary artery disease   . Hypertension   . Atrial fibrillation with RVR   . Pneumonia   . Anxiety     Past Surgical History  Procedure Laterality Date  . Coronary stent placement    . Coronary artery bypass graft    . Abdominal hysterectomy    . Cardioversion  04/05/2012    Procedure: CARDIOVERSION;  Surgeon: Thurmon Fair, MD;  Location: [facility];  Service: Cardiovascular;  Laterality: N/A;    Family History  Problem Relation Age of Onset  . Stroke Mother     History  Substance Use Topics  . Smoking status: Former Games developer  . Smokeless tobacco: Not on file  . Alcohol Use: No    OB History   Grav Para Term Preterm Abortions TAB SAB Ect Mult Living                  Review of Systems  Constitutional: Negative for fever.  Respiratory: Negative for shortness of breath.   Cardiovascular: Negative for chest pain.  Gastrointestinal: Positive for nausea. Negative for vomiting and abdominal pain.  Genitourinary: Positive for frequency.  Musculoskeletal: Negative for back  pain.  Neurological: Positive for dizziness and weakness. Negative for syncope, speech difficulty and headaches.  Psychiatric/Behavioral: Negative for agitation.  All other systems reviewed and are negative.    Allergies  Review of patient's allergies indicates no known allergies.  Home Medications   Current Outpatient Rx  Name  Route  Sig  Dispense  Refill  . albuterol (PROVENTIL HFA;VENTOLIN HFA) 108 (90 BASE) MCG/ACT inhaler   Inhalation   Inhale 2 puffs into the lungs every 4 (four) hours as needed. For shortness of breath         . amiodarone (PACERONE) 200 MG tablet   Oral   Take 100 mg by mouth every other day.         Marland Kitchen aspirin 81 MG tablet   Oral   Take 81 mg by mouth daily.         . dabigatran (PRADAXA) 150 MG CAPS   Oral   Take 150 mg by mouth every 12 (twelve) hours.         . digoxin (LANOXIN) 0.125 MG tablet   Oral   Take 125 mcg by mouth daily.         . furosemide (LASIX) 40 MG tablet   Oral   Take 40 mg by mouth daily.         Marland Kitchen ibandronate (BONIVA) 150 MG tablet   Oral  Take 150 mg by mouth every 30 (thirty) days. Takes in the middle of the month         . levothyroxine (SYNTHROID, LEVOTHROID) 100 MCG tablet   Oral   Take 100 mcg by mouth daily.         . metoprolol (LOPRESSOR) 50 MG tablet   Oral   Take 50 mg by mouth 2 (two) times daily.         . potassium chloride SA (K-DUR,KLOR-CON) 20 MEQ tablet   Oral   Take 20 mEq by mouth daily.         . rosuvastatin (CRESTOR) 5 MG tablet   Oral   Take 5 mg by mouth every evening.            BP 124/70  Pulse 113  Temp(Src) 96.7 F (35.9 C) (Oral)  Resp 23  SpO2 98%  Physical Exam CONSTITUTIONAL: Well developed/well nourished HEAD: Normocephalic/atraumatic EYES: EOMI/PERRL ENMT: Mucous membranes moist NECK: supple no meningeal signs SPINE:entire spine nontender CV: irregular, no murmurs/rubs/gallops noted LUNGS: Lungs are clear to auscultation bilaterally, no  apparent distress ABDOMEN: soft, nontender, no rebound or guarding GU:no cva tenderness NEURO: Pt is awake/alert, moves all extremitiesx4 No arm or leg drift is noted.  No facial droop is noted EXTREMITIES: pulses normal, full ROM SKIN: warm, color normal PSYCH: no abnormalities of mood noted  ED Course  Procedures   Labs Reviewed  CBC WITH DIFFERENTIAL - Abnormal; Notable for the following:    RBC 5.16 (*)    MCH 25.6 (*)    RDW 16.8 (*)    All other components within normal limits  COMPREHENSIVE METABOLIC PANEL - Abnormal; Notable for the following:    Glucose, Bld 136 (*)    GFR calc non Af Amer 69 (*)    GFR calc Af Amer 80 (*)    All other components within normal limits  URINALYSIS, ROUTINE W REFLEX MICROSCOPIC  POCT I-STAT TROPONIN I   Dg Chest 2 View  06/25/2012  *RADIOLOGY REPORT*  Clinical Data: Back pain, nausea, chest pain  CHEST - 2 VIEW  Comparison: 04/01/2012  Findings: Previous coronary bypass changes noted.  Hyperinflation evident compatible with background COPD/emphysema.  Mild cardiac enlargement but normal vascularity.  Negative for CHF or definite focal pneumonia.  No effusion or pneumothorax.  Trachea midline. Atherosclerosis of the aorta.  Degenerative changes of the spine with associated scoliosis.  IMPRESSION: Stable chronic and postoperative findings.  No superimposed acute process.   Original Report Authenticated By: Judie Petit. Shick, M.D.    10:44 PM Pt with h/o afib but reports recent cardioversion, currently on pradaxa.  She is now back in afib She reports fatigue and dizziness, but no cp/sob/focal weakness.  No syncope is reported Labs/imaging reviewed.  Currently awaiting digoxin level 11:35 PM Pt denied back pain on my exam She is well appearing She reports that these symptoms is how she feels when she is in afib She was cardioverted in January 2014 and was in sinus rhythm and has felt well until yesterday She denies cp/sob/syncope.  No diaphoresis She  woulld like to go home as she feels well I Suspect she may need repeat cardioversion and she also reports they are considering pacemaker She is already on pradaxa.   I was able to speak to the on call PA for [facility] and he will arrange f/u within 24 hours for this patient 12:10 AM Pt reports improvement, no weakness/dizziness upon ambulation Repeat troponin negative I  doubt ACS at this time   MDM  Nursing notes including past medical history and social history reviewed and considered in documentation xrays reviewed and considered Labs/vital reviewed and considered Previous records reviewed and considered - h/o afib with recent cardioversion, also has h/o CABG        Date: 06/25/2012  Rate: 116  Rhythm: atrial fibrillation  QRS Axis: normal  Intervals: normal  ST/T Wave abnormalities: nonspecific ST changes  Conduction Disutrbances:none  Narrative Interpretation:   Old EKG Reviewed: atrial fibrillation is present on today's ekg, sinus rhythm previous ekg though she has h/o afib    Joya Gaskins, MD 06/26/12 0011

## 2012-06-25 NOTE — ED Notes (Signed)
PT. REPORTS LEFT  BACK PAIN WITH NAUSEA , DIZZINESS AND LIGHTHEADED ONSET YESTERDAY .

## 2012-06-26 LAB — URINE CULTURE: Culture: NO GROWTH

## 2012-06-26 NOTE — Discharge Instructions (Signed)
Atrial Fibrillation  Your caregiver has diagnosed you with atrial fibrillation (AFib). The heart normally beats very regularly; AFib is a type of irregular heartbeat. The heart rate may be faster or slower than normal. This can prevent your heart from pumping as well as it should. AFib can be constant (chronic) or intermittent (paroxysmal).  CAUSES   Atrial fibrillation may be caused by:   Heart disease, including heart attack, coronary artery disease, heart failure, diseases of the heart valves, and others.   Blood clot in the lungs (pulmonary embolism).   Pneumonia or other infections.   Chronic lung disease.   Thyroid disease.   Toxins. These include alcohol, some medications (such as decongestant medications or diet pills), and caffeine.  In some people, no cause for AFib can be found. This is referred to as Lone Atrial Fibrillation.  SYMPTOMS    Palpitations or a fluttering in your chest.   A vague sense of chest discomfort.   Shortness of breath.   Sudden onset of lightheadedness or weakness.  Sometimes, the first sign of AFib can be a complication of the condition. This could be a stroke or heart failure.  DIAGNOSIS   Your description of your condition may make your caregiver suspicious of atrial fibrillation. Your caregiver will examine your pulse to determine if fibrillation is present. An EKG (electrocardiogram) will confirm the diagnosis. Further testing may help determine what caused you to have atrial fibrillation. This may include chest x-ray, echocardiogram, blood tests, or CT scans.  PREVENTION   If you have previously had atrial fibrillation, your caregiver may advise you to avoid substances known to cause the condition (such as stimulant medications, and possibly caffeine or alcohol). You may be advised to use medications to prevent recurrence. Proper treatment of any underlying condition is important to help prevent recurrence.  PROGNOSIS   Atrial fibrillation does tend to become a  chronic condition over time. It can cause significant complications (see below). Atrial fibrillation is not usually immediately life-threatening, but it can shorten your life expectancy. This seems to be worse in women. If you have lone atrial fibrillation and are under 60 years old, the risk of complications is very low, and life expectancy is not shortened.  RISKS AND COMPLICATIONS   Complications of atrial fibrillation can include stroke, chest pain, and heart failure. Your caregiver will recommend treatments for the atrial fibrillation, as well as for any underlying conditions, to help minimize risk of complications.  TREATMENT   Treatment for AFib is divided into several categories:   Treatment of any underlying condition.   Converting you out of AFib into a regular (sinus) rhythm.   Controlling rapid heart rate.   Prevention of blood clots and stroke.  Medications and procedures are available to convert your atrial fibrillation to sinus rhythm. However, recent studies have shown that this may not offer you any advantage, and cardiac experts are continuing research and debate on this topic.  More important is controlling your rapid heartbeat. The rapid heartbeat causes more symptoms, and places strain on your heart. Your caregiver will advise you on the use of medications that can control your heart rate.  Atrial fibrillation is a strong stroke risk. You can lessen this risk by taking blood thinning medications such as Coumadin (warfarin), or sometimes aspirin. These medications need close monitoring by your caregiver. Over-medication can cause bleeding. Too little medication may not protect against stroke.  HOME CARE INSTRUCTIONS    If your caregiver prescribed medicine to make   your heartbeat more normally, take as directed.   If blood thinners were prescribed by your caregiver, take EXACTLY as directed.   Perform blood tests EXACTLY as directed.   Quit smoking. Smoking increases your cardiac and lung  (pulmonary) risks.   DO NOT drink alcohol.   DO NOT drink caffeinated drinks (e.g. coffee, soda, chocolate, and leaf teas). You may drink decaffeinated coffee, soda or tea.   If you are overweight, you should choose a reduced calorie diet to lose weight. Please see a registered dietitian if you need more information about healthy weight loss. DO NOT USE DIET PILLS as they may aggravate heart problems.   If you have other heart problems that are causing AFib, you may need to eat a low salt, fat, and cholesterol diet. Your caregiver will tell you if this is necessary.   Exercise every day to improve your physical fitness. Stay active unless advised otherwise.   If your caregiver has given you a follow-up appointment, it is very important to keep that appointment. Not keeping the appointment could result in heart failure or stroke. If there is any problem keeping the appointment, you must call back to this facility for assistance.  SEEK MEDICAL CARE IF:   You notice a change in the rate, rhythm or strength of your heartbeat.   You develop an infection or any other change in your overall health status.  SEEK IMMEDIATE MEDICAL CARE IF:    You develop chest pain, abdominal pain, sweating, weakness or feel sick to your stomach (nausea).   You develop shortness of breath.   You develop swollen feet and ankles.   You develop dizziness, numbness, or weakness of your face or limbs, or any change in vision or speech.  MAKE SURE YOU:    Understand these instructions.   Will watch your condition.   Will get help right away if you are not doing well or get worse.  Document Released: 03/20/2005 Document Revised: 06/12/2011 Document Reviewed: 10/23/2007  [employer] Patient Information 2013 [employer].

## 2012-09-05 ENCOUNTER — Telehealth: Payer: Self-pay | Admitting: Cardiology

## 2012-09-05 NOTE — Telephone Encounter (Signed)
Returned call to home number and spoke w/ pt.  C/o feeling nauseous and having to lie down.   Stated she was seen in the hospital in March for the same thing.  Stated she has had dry heaves.  Pt stated she isn't sure if Dr. Herbie Baltimore or Dr. Collins Scotland rx'd her the nausea medicine.  RN advised pt to contact Dr. Collins Scotland r/t this and informed her daughter can call back if she has questions.  Pt stated she will have her call Dr. Collins Scotland r/t her nausea.

## 2012-09-05 NOTE — Telephone Encounter (Signed)
Pt daughter called and said she have started feeling nauseated at time-there was a medicine that Dr Herbie Baltimore had given her for that-Would like to know if she get this medicine again? If so please call to [facility] on [address]!

## 2012-09-06 ENCOUNTER — Other Ambulatory Visit: Payer: Self-pay | Admitting: Cardiology

## 2012-09-08 ENCOUNTER — Other Ambulatory Visit: Payer: Self-pay | Admitting: Cardiology

## 2012-10-02 ENCOUNTER — Other Ambulatory Visit: Payer: Self-pay | Admitting: Cardiology

## 2012-10-03 NOTE — Telephone Encounter (Signed)
Chart will be on your desk.  

## 2012-11-16 ENCOUNTER — Other Ambulatory Visit: Payer: Self-pay | Admitting: Cardiology

## 2012-11-18 NOTE — Telephone Encounter (Signed)
Rx was sent to pharmacy electronically. 

## 2013-01-06 ENCOUNTER — Encounter: Payer: Self-pay | Admitting: Cardiology

## 2013-01-07 ENCOUNTER — Ambulatory Visit (INDEPENDENT_AMBULATORY_CARE_PROVIDER_SITE_OTHER): Payer: Medicare HMO | Admitting: Cardiology

## 2013-01-07 ENCOUNTER — Encounter: Payer: Self-pay | Admitting: Cardiology

## 2013-01-07 VITALS — BP 102/70 | HR 91 | Ht 60.0 in | Wt 99.8 lb

## 2013-01-07 DIAGNOSIS — E785 Hyperlipidemia, unspecified: Secondary | ICD-10-CM

## 2013-01-07 DIAGNOSIS — I255 Ischemic cardiomyopathy: Secondary | ICD-10-CM

## 2013-01-07 DIAGNOSIS — Z951 Presence of aortocoronary bypass graft: Secondary | ICD-10-CM

## 2013-01-07 DIAGNOSIS — I10 Essential (primary) hypertension: Secondary | ICD-10-CM

## 2013-01-07 DIAGNOSIS — I2589 Other forms of chronic ischemic heart disease: Secondary | ICD-10-CM

## 2013-01-07 DIAGNOSIS — I2510 Atherosclerotic heart disease of native coronary artery without angina pectoris: Secondary | ICD-10-CM

## 2013-01-07 DIAGNOSIS — I739 Peripheral vascular disease, unspecified: Secondary | ICD-10-CM

## 2013-01-07 DIAGNOSIS — I4891 Unspecified atrial fibrillation: Secondary | ICD-10-CM

## 2013-01-07 NOTE — Progress Notes (Signed)
PATIENT: [name] MRN: [id number]  DOB: 05-24-1940   DOV:01/10/2013 PCP: Herb Grays, MD  Clinic Note: Chief Complaint  Patient presents with  . Follow-up    6 month rov, S.O.B    HPI: [name] is a 72 y.o. female with a PMH below who presents today for six-month followup he is a very pleasant elderly woman, who is a native of [place] who is now for followup of her ischemic cardiomyopathy, history of CABG for coronary disease as well as proximal A. fib. Her EF by last echocardiogram was 40-45% with diffuse hypokinesis. I last saw her in April of 2014. We attempted cardioversion with amiodarone load in January. She unfortunately is reverted back and age fibrillation. She saw Wilburt Finlay a few months ago, and had recurrent atrial fibrillation. He had recommended increasing her amiodarone dose to 100 mg daily from every other day. She was also supposed to have TSH checked.  Interval History: She comes in today overall for cardiac standpoint doing fairly well. She does not have any sensation of being in atrial fibrillation, unless there are rate goes fast. She will occasionally note some fluttering in the epigastric region and make her feel somewhat dizzy but really this is infrequent. Most of her complaints are noncardiac in nature which although to below the review of systems. She does note however that her thyroid levels and a difficult to manage. I think that the hypothyroidism may in part be related to the amiodarone. The only no cardiac symptom is that if she really increases her energy level her activity level she will get somewhat short of breath.  The remainder of Cardiovascular ROS: no chest pain or dyspnea on exertion positive for - palpitations negative for - edema, irregular heartbeat, loss of consciousness, murmur, orthopnea, paroxysmal nocturnal dyspnea, rapid heart rate or shortness of breath: Additional cardiac review of systems: Lightheadedness /dizziness - on occasion,  but nothing to the point of syncope/near-syncope; TIA/amaurosis fugax - no Melena - no, hematochezia no; hematuria - no; nosebleeds - no; claudication - no  Past Medical History  Diagnosis Date  . S/P CABG x 2 2009    LIMA-LAD, as she had in RCA with AV fistula ligation and Maze procedure  . CAD (coronary artery disease), native coronary artery 2009    75% LAD, diffuse 75-90% RCA --> Referred for CABG + MAZE  . Hypertension   . PAF (paroxysmal atrial fibrillation)     Now persistent despite being on Amiodarone & Pradaxa  . Ischemic cardiomyopathy     EF ~40-45% by Echo  . Dyslipidemia, goal LDL below 70     On Crestor, followed by PCP  . COPD (chronic obstructive pulmonary disease)   . PAD (peripheral artery disease) 2007    s/p L Ileac A stent ; most recent Dopplers July 2012: Less than 50% reduction bilaterally. ABI 0.96 on the right 0.88 on left  . Chronic low back pain     scoliosis & lordosis  . Anxiety   . H/O: pneumonia   . Hypothyroidism   . Anxiety   . Osteoarthritis of back     And neck    Prior Cardiac Evaluation and Past Surgical History: Past Surgical History  Procedure Laterality Date  . Iliac artery stent Left 2007    Ex Iliac - CFA (Smart STENTS -- 7x4 in EIA, 6 x 3 CFA)   . Coronary artery bypass graft  2009    LIMA-LAD, SVG-RCA (also MAZE & AV  fistula ligation)  . Maze  2009    along with CABG  . Cardioversion  04/05/2012    Procedure: CARDIOVERSION;  Surgeon: Thurmon Fair, MD;  Location: [facility] ENDOSCOPY;  Service: Cardiovascular;  Laterality: N/A;  . Transthoracic echocardiogram  Jan 2012    EF ~40-45%, global HK; PAP ~45-50 mmHg  . Nm myoview ltd  May 2013    No ischemia or Infarct  . Abdominal hysterectomy     No Known Allergies  Current Outpatient Prescriptions  Medication Sig Dispense Refill  . amiodarone (PACERONE) 200 MG tablet Take 0.5 tablets (100 mg total) by mouth every other day. As needed      . aspirin 81 MG tablet Take 81 mg by mouth  daily.      . CRESTOR 5 MG tablet TAKE 1 TABLET BY MOUTH IN THE EVENING  30 tablet  8  . digoxin (LANOXIN) 0.125 MG tablet Take 125 mcg by mouth daily.      . furosemide (LASIX) 40 MG tablet Take 40 mg by mouth daily.      Marland Kitchen HYDROcodone-acetaminophen (NORCO/VICODIN) 5-325 MG per tablet Take 1 tablet by mouth every 6 (six) hours as needed for pain.      Marland Kitchen ibandronate (BONIVA) 150 MG tablet Take 150 mg by mouth every 30 (thirty) days. Takes in the middle of the month      . levothyroxine (SYNTHROID, LEVOTHROID) 100 MCG tablet Take 100 mcg by mouth daily.      . metoprolol (LOPRESSOR) 50 MG tablet Take 50 mg by mouth 2 (two) times daily.      . potassium chloride SA (K-DUR,KLOR-CON) 20 MEQ tablet Take 20 mEq by mouth daily.      Marland Kitchen PRADAXA 150 MG CAPS TAKE ONE CAPSULE BY MOUTH TWICE DAILY  60 capsule  6   No current facility-administered medications for this visit.    History   Social History Narrative    She is a divorced mother of 1 and one child who died. Her daughter is here with her   today. She has got 4 grandchildren. She is a native of [place] and only has an occasional alcoholic   beverage. She does not get routine exercise mostly because of her spine pain. She did previously smoke but quit many years ago.     ROS: A comprehensive Review of Systems - Negative except Pertinent positives noted below and above in history of present illness. General ROS: positive for  - fatigue and sleep disturbance Ophthalmic ROS: positive for - decreased vision and It is at present doesn't reverted back to her pre-cataract vision. She now has significant vision problems. ENT ROS: positive for - nasal discharge, tinnitus, vertigo, visual changes and Dry mouth Musculoskeletal ROS: positive for - Chronic low back pain, "her bones all over hurt." Neurological ROS: no TIA or stroke symptoms positive for - gait disturbance, impaired coordination/balance, numbness/tingling and visual changes  PHYSICAL  EXAM BP 102/70  Pulse 91  Ht 5' (1.524 m)  Wt 99 lb 12.8 oz (45.269 kg)  BMI 19.49 kg/m2 General appearance: alert, cooperative, appears stated age, no distress and Thin, frail elderly woman. She actually appears older than her stated age. Answers questions properly. Well-groomed Neck: no adenopathy, no carotid bruit and no JVD Lungs: clear to auscultation bilaterally, normal percussion bilaterally and Mild bibasal rales that clear with cough. Heart: irregularly irregular rhythm and No M./R./G. Normal S1-S2. Nondisplaced PMI Abdomen: soft, non-tender; bowel sounds normal; no masses,  no organomegaly Extremities: extremities  normal, atraumatic, no cyanosis or edema; mild spider veins Pulses: 2+ and symmetric Neurologic: Mental status: Alert, oriented, thought content appropriate HEENT: [place]/AT, EOMI, MMM, anicteric sclera  [id number] today: Yes Rate: 91 , Rhythm: Atrial fibrillation;  septal MI, age indeterminate, RSR' pattern -- no significant change  Recent Labs: No recent labs  ASSESSMENT / PLAN: Atrial fibrillation Unfortunately, despite being on amiodarone, she continues to have recurrent A. Fib. At this juncture, I think we can potentially stop the amiodarone to see if how she does. If this will help with some of her thyroid control. If she were to go back into rapid A. fib at a lower stop his amiodarone. I see no reason to keep her on an antiarrhythmic if she isn't in normal sinus rhythm.  Thankfully she is relatively cinematic as long as her heart rate is controlled. Continue beta blocker and digoxin. She is anticoagulated with Pradaxa.  CAD (coronary artery disease) No active anginal symptoms. On aspirin with the Pradaxa. If his enzymes all bleeding stopped the aspirin. She is on a beta blocker and has no blood pressure of her additional medication she is also on statin and is monitored by her primary physician.   Ischemic cardiomyopathy Despite having low EF, she seems to  be tolerating A. fib well and is not having any active heart failure symptoms. She is on a decent dose of furosemide which keeps her volume overload at bay. She has not had significant heart failure episodes in quite some time.  PAD (peripheral artery disease) No active claudication. She is quite stable  Hyperlipidemia Currently on a statin. Monitor by her primary physician. She should have labs drawn in the near future including a CMP and lipid panel as well as TSH. I defer this to her primary physician, but has not checked the next few months to am happy order the studies myself.  S/P CABG x 2 She has not had any formal ischemic evaluation since her bypass surgery. With no symptoms, not currently pressed to do so. However, I think as part of routine surveillance would not be unreasonable to check a Myoview study within the next year. This could potentially be ordered at her six-month followup.    Orders Placed This Encounter  Procedures  . EKG 12-Lead   Meds ordered this encounter  Medications  . amiodarone (PACERONE) 200 MG tablet    Sig: Take 0.5 tablets (100 mg total) by mouth every other day. As needed    Followup: Six-month PA/NP, 12 months with MD  Myleigh Amara W. Herbie Baltimore, M.D., M.S. [facility] [address] [place], [place]  [address]  [phone] Pager # [phone]

## 2013-01-07 NOTE — Patient Instructions (Addendum)
Things seem pretty stable from a heart perspective.  You are still in Atrial Fibrillation, but seem fine with it.  No Heart Failure Symptoms.  For now - I think that we can stop Amiodarone; keep the pills to be used on an AS NEEDED BASIS.  Your blood pressure is OK.  We need to check some labs - chemistry, cholesterol & blood count. Will check with Dr Yehuda Budd IF NOT DONE WILL SEND YOU A LABSLIP  I will see you back in 6 months.  Marykay Lex, MD  Your physician wants you to follow-up in: 6 months. You will receive a reminder letter in the mail two months in advance. If you don't receive a letter, please call our office to schedule the follow-up appointment.

## 2013-01-09 MED ORDER — AMIODARONE HCL 200 MG PO TABS: 100.0000 mg | ORAL_TABLET | ORAL | Status: DC

## 2013-01-10 ENCOUNTER — Encounter: Payer: Self-pay | Admitting: Cardiology

## 2013-01-10 DIAGNOSIS — I255 Ischemic cardiomyopathy: Secondary | ICD-10-CM | POA: Insufficient documentation

## 2013-01-10 DIAGNOSIS — Z951 Presence of aortocoronary bypass graft: Secondary | ICD-10-CM | POA: Insufficient documentation

## 2013-01-10 DIAGNOSIS — I739 Peripheral vascular disease, unspecified: Secondary | ICD-10-CM | POA: Insufficient documentation

## 2013-01-10 NOTE — Assessment & Plan Note (Signed)
Despite having low EF, she seems to be tolerating A. fib well and is not having any active heart failure symptoms. She is on a decent dose of furosemide which keeps her volume overload at bay. She has not had significant heart failure episodes in quite some time.

## 2013-01-10 NOTE — Assessment & Plan Note (Signed)
Currently on a statin. Monitor by her primary physician. She should have labs drawn in the near future including a CMP and lipid panel as well as TSH. I defer this to her primary physician, but has not checked the next few months to am happy order the studies myself.

## 2013-01-10 NOTE — Assessment & Plan Note (Signed)
No active anginal symptoms. On aspirin with the Pradaxa. If his enzymes all bleeding stopped the aspirin. She is on a beta blocker and has no blood pressure of her additional medication she is also on statin and is monitored by her primary physician.

## 2013-01-10 NOTE — Assessment & Plan Note (Signed)
Unfortunately, despite being on amiodarone, she continues to have recurrent A. Fib. At this juncture, I think we can potentially stop the amiodarone to see if how she does. If this will help with some of her thyroid control. If she were to go back into rapid A. fib at a lower stop his amiodarone. I see no reason to keep her on an antiarrhythmic if she isn't in normal sinus rhythm.  Thankfully she is relatively cinematic as long as her heart rate is controlled. Continue beta blocker and digoxin. She is anticoagulated with Pradaxa.

## 2013-01-10 NOTE — Assessment & Plan Note (Signed)
She has not had any formal ischemic evaluation since her bypass surgery. With no symptoms, not currently pressed to do so. However, I think as part of routine surveillance would not be unreasonable to check a Myoview study within the next year. This could potentially be ordered at her six-month followup.

## 2013-01-10 NOTE — Assessment & Plan Note (Signed)
No active claudication. She is quite stable

## 2013-02-11 ENCOUNTER — Other Ambulatory Visit: Payer: Self-pay | Admitting: Cardiology

## 2013-02-12 NOTE — Telephone Encounter (Signed)
Rx was sent to pharmacy electronically. 

## 2013-04-12 ENCOUNTER — Other Ambulatory Visit: Payer: Self-pay | Admitting: Cardiology

## 2013-04-14 NOTE — Telephone Encounter (Signed)
Rx was sent to pharmacy electronically. 

## 2013-05-07 ENCOUNTER — Other Ambulatory Visit: Payer: Self-pay | Admitting: Cardiology

## 2013-07-03 ENCOUNTER — Encounter: Payer: Self-pay | Admitting: *Deleted

## 2013-07-07 ENCOUNTER — Ambulatory Visit (INDEPENDENT_AMBULATORY_CARE_PROVIDER_SITE_OTHER): Payer: Medicare HMO | Admitting: Cardiology

## 2013-07-07 ENCOUNTER — Other Ambulatory Visit: Payer: Self-pay | Admitting: Cardiology

## 2013-07-07 ENCOUNTER — Encounter: Payer: Self-pay | Admitting: Cardiology

## 2013-07-07 VITALS — BP 118/70 | HR 101 | Ht 60.0 in | Wt 99.1 lb

## 2013-07-07 DIAGNOSIS — I4891 Unspecified atrial fibrillation: Secondary | ICD-10-CM

## 2013-07-07 DIAGNOSIS — I255 Ischemic cardiomyopathy: Secondary | ICD-10-CM

## 2013-07-07 DIAGNOSIS — I10 Essential (primary) hypertension: Secondary | ICD-10-CM

## 2013-07-07 DIAGNOSIS — E785 Hyperlipidemia, unspecified: Secondary | ICD-10-CM

## 2013-07-07 DIAGNOSIS — I739 Peripheral vascular disease, unspecified: Secondary | ICD-10-CM

## 2013-07-07 DIAGNOSIS — I2510 Atherosclerotic heart disease of native coronary artery without angina pectoris: Secondary | ICD-10-CM

## 2013-07-07 DIAGNOSIS — I2589 Other forms of chronic ischemic heart disease: Secondary | ICD-10-CM

## 2013-07-07 DIAGNOSIS — Z951 Presence of aortocoronary bypass graft: Secondary | ICD-10-CM

## 2013-07-07 MED ORDER — AMIODARONE HCL 200 MG PO TABS: ORAL_TABLET | ORAL | Status: DC

## 2013-07-07 NOTE — Patient Instructions (Signed)
Dr Herbie BaltimoreHarding has recommended making the following medication changes:  INCREASE Amiodarone 200 mg - take 2 tablets twice daily for 2 days               Take 1 tablet twice daily for 2 days               Take 1 tablet once daily thereafter  Dr Herbie BaltimoreHarding wants you to follow-up in 3-4 months. You will receive a reminder letter in the mail one months in advance. If you don't receive a letter, please call our office to schedule the follow-up appointment.

## 2013-07-08 ENCOUNTER — Encounter: Payer: Self-pay | Admitting: Cardiology

## 2013-07-08 NOTE — Assessment & Plan Note (Signed)
No active signs of heart failure. On furosemide as well as other medications. Again not on the reduction do to prior prostatic problems. She is also on digoxin for decreased morbidity.

## 2013-07-08 NOTE — Assessment & Plan Note (Signed)
She continues to be relatively stable with no active symptoms. She is on aspirin and beta blocker. She is also on Crestor low dose. Not on ACE inhibitor due to 2 problems with orthostatic hypotension in the past.

## 2013-07-08 NOTE — Assessment & Plan Note (Addendum)
Recurrent/chronic A. fib now with intermittent bouts of what sounds like RVR that is more symptomatic. With her symptoms, I am more inclined not to go back to amiodarone. She is currently only on beta blocker and digoxin for rate control that does not seem adequate. Anticoagulated on her back so.  Plan:Amiodarone 200 mg - take 2 tablets twice daily for 2 days; take 1 tablet twice daily for 2 days; take 1 tablet once daily thereafter We'll need monitoring of thyroid levels. With her already having hypothyroid, there is no fear for possible hyperthyroidism. If additional support as required while on amiodarone, I will defer to PCP for adjustment.

## 2013-07-08 NOTE — Progress Notes (Signed)
PCP: Herb Grays, MD  Clinic Note: Chief Complaint  Patient presents with  . 6 month visit    no chest pain, back pain , becoming very confused, some sob , anxious and can not sleep, no edema, briuising and knot develop    HPI: [name] is a 73 y.o. female with a Cardiovascular Problem List below who presents today for six-month followup of her CAD-CABG, mild ischemic cardiomyopathy, PAF now essentially chronic. She has failed to cardioversion in January of 2014 with reversion back to atrial fibrillation. She had been on amiodarone that was then discontinued for concerns with TSH abnormalities and side effects. She is also been troubled with multiple problems including musculoskeletal back pain as well as significant vision abnormalities that have yet to be fully delineated. She also has significant right-sided shoulder pain that is chronic.  Interval History: From a cardiac standpoint on what she notices now is that starting this past weekend she has been noticing an increasing heart rates with irregularity that has made her more nervous. Nervousness and causes her heart rate go faster. She's been having very poor sleep. She gets a little short of breath and her rate goes fast but denies any chest tightness or pressure. She has not had any PND or orthopnea but does not do the job lying flat because of her back pain. She is not noting exertional dyspnea. She denies any lightheadedness or dizziness/syncope or near-syncope. No amaurosis fugax or TIA type symptoms. She has noted significant increased anxiety and panic-type symptoms. She denies any real claudication symptoms but notes having a warm sensation in her legs and mild splotchy macular rash is upper down her legs that come and go. She denies any melena hematochezia or hematuria or epistaxis.  Again, as was the case during her last visit, most of her complaints are noncardiac in nature. See review of systems  Past Medical History    Diagnosis Date  . S/P CABG x 2 2009    LIMA-LAD, as she had in RCA with AV fistula ligation and Maze procedure  . CAD (coronary artery disease), native coronary artery 2009    75% LAD, diffuse 75-90% RCA --> Referred for CABG + MAZE  . Hypertension   . PAF (paroxysmal atrial fibrillation)     Now persistent despite being on Amiodarone & Pradaxa  . Ischemic cardiomyopathy     EF ~40-45% by Echo  . Dyslipidemia, goal LDL below 70     On Crestor, followed by PCP  . COPD (chronic obstructive pulmonary disease)   . PAD (peripheral artery disease) 2007    s/p L Ileac A stent ; most recent Dopplers July 2012: Less than 50% reduction bilaterally. ABI 0.96 on the right 0.88 on left;;;12/27/2011   -ABI right .87 and left ABI .78  ,LEFT CIA and EIA stent normall patency, left CFA,SFA,and popliteal 0-49%; rgt proximal SFA 50-69%,rgt CIA,EIA, and CFA 0-49%  . Chronic low back pain     scoliosis & lordosis  . Anxiety   . H/O: pneumonia   . Hypothyroidism   . Anxiety   . Osteoarthritis of back     And neck  . Arrhythmia 04/05/2012    cardioversion-converted to sinus bradycardia    Prior Cardiac Evaluation and Past Surgical History: Past Surgical History  Procedure Laterality Date  . Iliac artery stent Left 04/06/2005    Ex Iliac - CFA (Smart STENTS -- 7x4 in EIA, 6 x 3 CFA)   . Coronary artery bypass  graft  2009    LIMA-LAD, SVG-RCA (also MAZE & AV fistula ligation)  . Maze  2009    along with CABG  . Cardioversion  04/05/2012    Procedure: CARDIOVERSION;  Surgeon: Thurmon FairMihai Croitoru, MD;  Location: [facility];  Service: Cardiovascular;  Laterality: N/A;  . Transthoracic echocardiogram  Jan 2012    EF ~40-45%, global HK; PAP ~45-50 mmHg  . Nm myoview ltd  May 2013    No ischemia or Infarct  . Abdominal hysterectomy    . Cardiac catheterization  07/30/2007    75% LAD ,3 stenoses of 75-90% in  RCA;circumflex from proximal RCA with no lesion seen,normal ramus intermediate branh; normal LV systoilc  function   MEDICATIONS AND ALLERGIES REVIEWED IN EPIC -- was actually not taking amiodarone No Change in Social and Family History  ROS: A comprehensive Review of Systems - Notable for continued vision abnormalities. She has off and on spells of having a dark spot in her eye at nighttime that we'll then move a lot of the symptoms have improved however. she has significant right-sided shoulder pain with numbness down the arm. She also significant back pains. He notes fatigue and sleep disturbance. Significant anxiety. Because of her back pain she is as poor coordination and balance.  Otherwise the negative if not noted here or history of present illness.  PHYSICAL EXAM BP 118/70  Pulse 101  Ht 5' (1.524 m)  Wt 99 lb 1.6 oz (44.951 kg)  BMI 19.35 kg/m2 General appearance: alert, cooperative, appears stated age, no distress and somewhat thin/frail Neck: no adenopathy, no carotid bruit and no JVD Lungs: clear to auscultation bilaterally, normal percussion bilaterally and non-labored Heart: Irregularly irregular rhythm with mildly rapid rate, S1, S2 normal, no murmur, click, rub or gallop nondisplaced PMI Abdomen: soft, non-tender; bowel sounds normal; no masses,  no organomegaly; Extremities: extremities normal, atraumatic, no cyanosis, and edema; mild spider veins and varicosities with fine punctate macular lesions on bilateral ankles into the calf Pulses: 2+ and symmetric; Neurologic: Mental status: Alert, oriented, thought content appropriate; Cranial nerves: II-XII grossly intact   Adult ECG Report  Rate: 101 ;  Rhythm: atrial fibrillation and With rapid ventricular response  QRS Axis: 33 ;  PR Interval: * ;  QRS Duration: 86 ; QTc: 4 and 48  Voltages: Borderline low  Conduction Disturbances: Borderline incomplete RBBB - SR' in precordial leads  Other Abnormalities: Fractionated QRS complexes in precordial leads, cannot exclude anterior infarct age undetermined.   Narrative  Interpretation: Relatively stable EKG but with increased rate of A. fib response  Recent Labs: None presently provided from PCP  ASSESSMENT / PLAN: Atrial fibrillation Recurrent/chronic A. fib now with intermittent bouts of what sounds like RVR that is more symptomatic. With her symptoms, I am more inclined not to go back to amiodarone. She is currently only on beta blocker and digoxin for rate control that does not seem adequate. Anticoagulated on her back so.  Plan:Amiodarone 200 mg - take 2 tablets twice daily for 2 days; take 1 tablet twice daily for 2 days; take 1 tablet once daily thereafter We'll need monitoring of thyroid levels. With her already having hypothyroid, there is no fear for possible hyperthyroidism. If additional support as required while on amiodarone, I will defer to PCP for adjustment.  CAD (coronary artery disease) She continues to be relatively stable with no active symptoms. She is on aspirin and beta blocker. She is also on Crestor low dose. Not on ACE inhibitor  due to 2 problems with orthostatic hypotension in the past.  Ischemic cardiomyopathy No active signs of heart failure. On furosemide as well as other medications. Again not on the reduction do to prior prostatic problems. She is also on digoxin for decreased morbidity.  HTN (hypertension) She really is not hypertensive on this low dose of beta blocker. Hence the reason to not use ACE inhibitor.  PAD (peripheral artery disease) No acute claudication symptoms. In the absence of symptoms, would not routinely followup with Dopplers.  Hyperlipidemia It continue to be of the understanding that her PCP is checking her labs. She is on Crestor. It would be nice to see what her level look like. Her goal LDL is less than 70 and HDL greater than 40.  Request Dr. Collins Scotland provide copy of labs are checked, especially since she is following her thyroid levels.. If not I will check labs for myself.   S/P CABG x 2 Last  Myoview stress test was in 2013. Given the lack of symptoms, would consider followup Myoview in 2017.    Orders Placed This Encounter  Procedures  . EKG 12-Lead    Order Specific Question:  Where should this test be performed    Answer:  OTHER   Meds ordered this encounter  Medications  . amiodarone (PACERONE) 200 MG tablet    Sig: Take 1 tablet (200 mg total) by mouth daily as directed.    Dispense:  90 tablet    Refill:  3    Followup: 3-4 months  Tyreonna Czaplicki W. Herbie Baltimore, M.D., M.S. Interventional Cardiologist [facility]

## 2013-07-08 NOTE — Assessment & Plan Note (Signed)
No acute claudication symptoms. In the absence of symptoms, would not routinely followup with Dopplers.

## 2013-07-08 NOTE — Assessment & Plan Note (Signed)
She really is not hypertensive on this low dose of beta blocker. Hence the reason to not use ACE inhibitor.

## 2013-07-08 NOTE — Assessment & Plan Note (Signed)
It continue to be of the understanding that her PCP is checking her labs. She is on Crestor. It would be nice to see what her level look like. Her goal LDL is less than 70 and HDL greater than 40.  Request Dr. Collins ScotlandSpear provide copy of labs are checked, especially since she is following her thyroid levels.. If not I will check labs for myself.

## 2013-07-08 NOTE — Assessment & Plan Note (Addendum)
Last Myoview stress test was in 2013. Given the lack of symptoms, would consider followup Myoview in 2017.

## 2013-08-06 ENCOUNTER — Other Ambulatory Visit: Payer: Self-pay | Admitting: Cardiology

## 2013-08-06 NOTE — Telephone Encounter (Signed)
Rx refill sent to patient pharmacy   

## 2013-08-21 ENCOUNTER — Other Ambulatory Visit: Payer: Self-pay | Admitting: *Deleted

## 2013-08-21 ENCOUNTER — Telehealth: Payer: Self-pay | Admitting: Cardiology

## 2013-08-21 MED ORDER — AMIODARONE HCL 200 MG PO TABS: ORAL_TABLET | ORAL | Status: DC

## 2013-08-21 NOTE — Telephone Encounter (Signed)
Please call,need you to go over her all her medicine with her.

## 2013-08-21 NOTE — Telephone Encounter (Signed)
i looked at Dr. Elissa HeftyHarding's last mention of amiodarone and pt. Was on a decreasing dosage and now should be on 200 mg daliy, med refilled per pt request

## 2013-09-09 ENCOUNTER — Other Ambulatory Visit: Payer: Self-pay | Admitting: Cardiology

## 2013-09-09 NOTE — Telephone Encounter (Signed)
Rx was sent to pharmacy electronically. 

## 2013-09-15 ENCOUNTER — Telehealth: Payer: Self-pay | Admitting: Cardiology

## 2013-09-15 NOTE — Telephone Encounter (Signed)
Thanks.   She should not be penalized for a change in medication regimen.  I agree with the logic of what happened.  Marykay LexHARDING,Pacey Altizer W, MD

## 2013-09-15 NOTE — Telephone Encounter (Signed)
Patient states that she needs a 90 day supply (180 tabs) of her Amiodarone --she takes 2 daily.  Her daughter picked up her refills last week and it was for Pradaxa, which she did not need.

## 2013-09-15 NOTE — Telephone Encounter (Signed)
RN returned call to patient. She states she takes amiodarone 200mg  BID and needs a new refill. Refill for #90 tabs was sent in on 5/21.   Patient states she took amiodarone 200mg  x2 BID for 2 days, 200mg  BID for 2 more days, 200mg  QD for 2 days then went back to 200mg  BID (after last OV with Dr. Herbie BaltimoreHarding in April). RN informed her that she is supposed to be taking amiodarone 200mg  once daily per note and AVS instructions  Patient states that pharmacy only gave her 4 amiodarone pills and was explaining something about her daughter having to pay an additional 7$12. RN informed her that she probably had to pay as it appears to the pharmacy that she is requesting a refills "too soon" since they had once daily dosing on file and she had been taking BID. Patient states she is upset with pharmacy and may change pharmacies. RN advised her to contact pharmacy to explain medication issue to see about getting her amiodarone and if it will cost her (as insurance may not cover)  Will send to Dr. Herbie BaltimoreHarding as Lorain ChildesFYI

## 2013-10-03 ENCOUNTER — Other Ambulatory Visit: Payer: Self-pay | Admitting: Cardiology

## 2013-10-06 NOTE — Telephone Encounter (Signed)
Rx was sent to pharmacy electronically. 

## 2013-11-03 ENCOUNTER — Other Ambulatory Visit: Payer: Self-pay | Admitting: Cardiology

## 2013-11-19 ENCOUNTER — Other Ambulatory Visit: Payer: Self-pay | Admitting: Cardiology

## 2013-11-19 NOTE — Telephone Encounter (Signed)
Refilled #30 tablets with 9 refills on 10/06/13

## 2013-11-21 ENCOUNTER — Telehealth: Payer: Self-pay | Admitting: Cardiology

## 2013-11-21 NOTE — Telephone Encounter (Signed)
Received call from patient's daughter Nicole CellaDorothy she stated she went to get mother's metoprolol 50 mg 1/2 twice a day refilled and pharmacist said metoprolol had been discontinued.Stated she has been taking.Message sent to Dr.Harding for advice.

## 2013-11-21 NOTE — Telephone Encounter (Signed)
Returning your call. °

## 2013-11-21 NOTE — Telephone Encounter (Signed)
[name] is calling about her mother's Metoprolol and the Pharmacy is stating that the mediation was discontinue and now they are confused on whether she should be still taking it .Marland Kitchen. Please Call   Thanks

## 2013-11-21 NOTE — Telephone Encounter (Signed)
Returned call to patient's daughter Dorothy no answer.LMTC. 

## 2013-11-21 NOTE — Telephone Encounter (Signed)
Returned call to patient's daughter Nicole CellaDorothy no answer.LMTC.

## 2013-11-23 NOTE — Telephone Encounter (Signed)
I don't see that I discontinued it. ? Not sure if her PCP may have done so.  DH

## 2013-11-24 MED ORDER — METOPROLOL TARTRATE 50 MG PO TABS: 25.0000 mg | ORAL_TABLET | Freq: Two times a day (BID) | ORAL | Status: DC

## 2013-11-24 NOTE — Telephone Encounter (Signed)
Returned call to patient's daughter Nicole Cella. Dr.Harding did not stop metoprolol.Refill sent to pharmacy.

## 2013-11-24 NOTE — Telephone Encounter (Signed)
Returned call to patient's daughter Dorothy no answer.LMTC. 

## 2013-11-24 NOTE — Telephone Encounter (Signed)
Returning your call. °

## 2013-12-02 ENCOUNTER — Encounter: Payer: Self-pay | Admitting: Cardiology

## 2013-12-02 ENCOUNTER — Ambulatory Visit (INDEPENDENT_AMBULATORY_CARE_PROVIDER_SITE_OTHER): Payer: Medicare HMO | Admitting: Cardiology

## 2013-12-02 ENCOUNTER — Telehealth (HOSPITAL_COMMUNITY): Payer: Self-pay | Admitting: *Deleted

## 2013-12-02 VITALS — BP 106/62 | HR 63 | Ht 59.0 in | Wt 95.0 lb

## 2013-12-02 DIAGNOSIS — E032 Hypothyroidism due to medicaments and other exogenous substances: Secondary | ICD-10-CM

## 2013-12-02 DIAGNOSIS — E038 Other specified hypothyroidism: Secondary | ICD-10-CM

## 2013-12-02 DIAGNOSIS — I739 Peripheral vascular disease, unspecified: Secondary | ICD-10-CM

## 2013-12-02 DIAGNOSIS — I2510 Atherosclerotic heart disease of native coronary artery without angina pectoris: Secondary | ICD-10-CM

## 2013-12-02 DIAGNOSIS — I4891 Unspecified atrial fibrillation: Secondary | ICD-10-CM

## 2013-12-02 DIAGNOSIS — T50904A Poisoning by unspecified drugs, medicaments and biological substances, undetermined, initial encounter: Secondary | ICD-10-CM

## 2013-12-02 DIAGNOSIS — E785 Hyperlipidemia, unspecified: Secondary | ICD-10-CM

## 2013-12-02 DIAGNOSIS — I255 Ischemic cardiomyopathy: Secondary | ICD-10-CM

## 2013-12-02 DIAGNOSIS — I2589 Other forms of chronic ischemic heart disease: Secondary | ICD-10-CM

## 2013-12-02 HISTORY — PX: OTHER SURGICAL HISTORY: SHX169

## 2013-12-02 LAB — CBC WITH DIFFERENTIAL/PLATELET
Basophils Absolute: 0 10*3/uL (ref 0.0–0.1)
Basophils Relative: 0.9 % (ref 0.0–3.0)
EOS PCT: 1.5 % (ref 0.0–5.0)
Eosinophils Absolute: 0.1 10*3/uL (ref 0.0–0.7)
HCT: 38.3 % (ref 36.0–46.0)
HEMOGLOBIN: 12.3 g/dL (ref 12.0–15.0)
LYMPHS ABS: 0.8 10*3/uL (ref 0.7–4.0)
Lymphocytes Relative: 15.1 % (ref 12.0–46.0)
MCHC: 32.1 g/dL (ref 30.0–36.0)
MCV: 78.4 fl (ref 78.0–100.0)
MONO ABS: 0.4 10*3/uL (ref 0.1–1.0)
Monocytes Relative: 6.9 % (ref 3.0–12.0)
Neutro Abs: 4 10*3/uL (ref 1.4–7.7)
Neutrophils Relative %: 75.6 % (ref 43.0–77.0)
PLATELETS: 197 10*3/uL (ref 150.0–400.0)
RBC: 4.89 Mil/uL (ref 3.87–5.11)
RDW: 18.1 % — AB (ref 11.5–15.5)
WBC: 5.3 10*3/uL (ref 4.0–10.5)

## 2013-12-02 LAB — COMPREHENSIVE METABOLIC PANEL
ALK PHOS: 56 U/L (ref 39–117)
ALT: 35 U/L (ref 0–35)
AST: 40 U/L — ABNORMAL HIGH (ref 0–37)
Albumin: 4.3 g/dL (ref 3.5–5.2)
BILIRUBIN TOTAL: 1.2 mg/dL (ref 0.2–1.2)
BUN: 19 mg/dL (ref 6–23)
CO2: 33 meq/L — AB (ref 19–32)
CREATININE: 1.2 mg/dL (ref 0.4–1.2)
Calcium: 9 mg/dL (ref 8.4–10.5)
Chloride: 99 mEq/L (ref 96–112)
GFR: 45.78 mL/min — AB (ref >60.00)
Glucose, Bld: 96 mg/dL (ref 70–99)
Potassium: 4.1 mEq/L (ref 3.5–5.1)
Sodium: 141 mEq/L (ref 135–145)
TOTAL PROTEIN: 7.3 g/dL (ref 6.0–8.3)

## 2013-12-02 LAB — LIPID PANEL
CHOL/HDL RATIO: 2
CHOLESTEROL: 142 mg/dL (ref 0–200)
HDL: 62.1 mg/dL (ref >39.00)
LDL Cholesterol: 63 mg/dL (ref 0–99)
NonHDL: 79.9
Triglycerides: 83 mg/dL (ref 0.0–149.0)
VLDL: 16.6 mg/dL (ref 0.0–40.0)

## 2013-12-02 LAB — TSH: TSH: 1.26 u[IU]/mL (ref 0.35–4.50)

## 2013-12-02 LAB — T4, FREE: FREE T4: 2.48 ng/dL — AB (ref 0.60–1.60)

## 2013-12-02 NOTE — Assessment & Plan Note (Signed)
No chest pain, she is on ASA and BB.  Not on ACE due to orthostatic hypotension in the past.  Plans for cervical disc surgery in near future.  Will do lexiscan myoview, difficult to determine is she were to have angina due to decreased activity for neck pain.

## 2013-12-02 NOTE — Assessment & Plan Note (Signed)
Seems more persistent at this time.  Pt would like to come off amiodarone, her thyroid has been affected.  Her vision is not improved after cataract surgery, thought that it may be due to amiodarone.  Rate is controlled today and she is on anticoagualtion.  Will check dig level and thyroid today with her symptoms of fatigue, general not feeling well. Will then discuss with Dr. Herbie Baltimore.

## 2013-12-02 NOTE — Assessment & Plan Note (Signed)
She is wanting to decrease Crestor, more frustration with so many meds, no side effects, if labs are good, perhaps we could decrease dose.  Check lipids.

## 2013-12-02 NOTE — Patient Instructions (Addendum)
Your physician recommends that you have  lab work today:DIGOXIN,CMP,CBC,LIPID,TSH,FREET4  Your physician recommends that you continue on your current medications as directed. Please refer to the Current Medication list given to you today.   Your physician has requested that you have a lexiscan myoview. For further information please visit [email or web address] Please follow instruction sheet, as given.  Your physician recommends that you schedule a follow-up appointment with DR. HARDING

## 2013-12-02 NOTE — Assessment & Plan Note (Signed)
Occurred with amiodarone, will recheck level today with her fatigue.

## 2013-12-02 NOTE — Assessment & Plan Note (Signed)
s/p L Iliac A stent ; most recent Dopplers July 2012: Less than 50% reduction bilaterally. ABI 0.96 on the right 0.88 on left, will schedule dopplers as she is having burning sensation at times on shins.

## 2013-12-02 NOTE — Assessment & Plan Note (Signed)
Stable. EF ~40-45% by Echo 2013

## 2013-12-02 NOTE — Progress Notes (Signed)
12/02/2013   PCP: Herb Grays, MD   Chief Complaint  Patient presents with  . Follow-up    no complaints     Primary Cardiologist: Dr. Ranae Palms   HPI :  73 y.o. female with a Cardiovascular Problem List below who presents today for six-month followup of her CAD-CABG, mild ischemic cardiomyopathy, PAF now essentially chronic. She has failed to cardioversion in January of 2014 with reversion back to atrial fibrillation. She had been on amiodarone that was then discontinued but has restarted for concerns with TSH abnormalities and side effects. She is also been troubled with multiple problems including musculoskeletal back pain as well as significant vision abnormalities that have yet to be fully delineated. She also has significant right-sided shoulder pain that is chronic.  Multiple complaints today, needs cervical disk surgery in near future. She would like to come off amiodarone if possible, would like to decrease crestor if possible.   Marland KitchenHer PCP follows her lipids, last done 6 months ago, she is fasting today so will recheck as pt would like to adjust the crestor.  She also complains of bil lower leg burning that sounds like neuropathy, but she does have PAD and last dopplers were 2012.  Will check arterial lower ext dopplers.  She complains of fatigue and sleeping a lot.  Maybe due to thyroid. Will check labs.     No Known Allergies  Current Outpatient Prescriptions  Medication Sig Dispense Refill  . amiodarone (PACERONE) 200 MG tablet Take 1 tablet (200 mg total) by mouth daily as directed.  90 tablet  3  . aspirin 81 MG tablet Take 81 mg by mouth daily.      . CRESTOR 5 MG tablet TAKE 1 TABLET BY MOUTH IN THE EVENING  30 tablet  9  . DIGOX 125 MCG tablet TAKE ONE TABLET BY MOUTH DAILY.  90 tablet  3  . furosemide (LASIX) 40 MG tablet Take 40 mg by mouth daily.      Marland Kitchen HYDROcodone-acetaminophen (NORCO/VICODIN) 5-325 MG per tablet Take 1 tablet by mouth every 6  (six) hours as needed for pain.      Marland Kitchen ibandronate (BONIVA) 150 MG tablet Take 150 mg by mouth every 30 (thirty) days. Takes in the middle of the month      . levothyroxine (SYNTHROID, LEVOTHROID) 75 MCG tablet Take 75 mcg by mouth daily before breakfast.      . metoprolol (LOPRESSOR) 50 MG tablet Take 0.5 tablets (25 mg total) by mouth 2 (two) times daily.  60 tablet  6  . potassium chloride SA (K-DUR,KLOR-CON) 20 MEQ tablet Take 20 mEq by mouth daily.      Marland Kitchen PRADAXA 150 MG CAPS capsule TAKE ONE CAPSULE BY MOUTH TWICE DAILY  60 capsule  4  . Potassium Chloride ER 20 MEQ TBCR        No current facility-administered medications for this visit.    Past Medical History  Diagnosis Date  . S/P CABG x 2 2009    LIMA-LAD, as she had in RCA with AV fistula ligation and Maze procedure  . CAD (coronary artery disease), native coronary artery 2009    75% LAD, diffuse 75-90% RCA --> Referred for CABG + MAZE  . Hypertension   . PAF (paroxysmal atrial fibrillation)     Now persistent despite being on Amiodarone & Pradaxa  . Ischemic cardiomyopathy     EF ~40-45% by Echo  . Dyslipidemia, goal  LDL below 70     On Crestor, followed by PCP  . COPD (chronic obstructive pulmonary disease)   . PAD (peripheral artery disease) 2007    s/p L Ileac A stent ; most recent Dopplers July 2012: Less than 50% reduction bilaterally. ABI 0.96 on the right 0.88 on left;;;12/27/2011   -ABI right .87 and left ABI .78  ,LEFT CIA and EIA stent normall patency, left CFA,SFA,and popliteal 0-49%; rgt proximal SFA 50-69%,rgt CIA,EIA, and CFA 0-49%  . Chronic low back pain     scoliosis & lordosis  . Anxiety   . H/O: pneumonia   . Hypothyroidism   . Anxiety   . Osteoarthritis of back     And neck  . Arrhythmia 04/05/2012    cardioversion-converted to sinus bradycardia  . Cervical neck pain with evidence of disc disease     with need for surgery    Past Surgical History  Procedure Laterality Date  . Iliac artery stent  Left 04/06/2005    Ex Iliac - CFA (Smart STENTS -- 7x4 in EIA, 6 x 3 CFA)   . Coronary artery bypass graft  2009    LIMA-LAD, SVG-RCA (also MAZE & AV fistula ligation)  . Maze  2009    along with CABG  . Cardioversion  04/05/2012    Procedure: CARDIOVERSION;  Surgeon: Thurmon Fair, MD;  Location: [facility] ENDOSCOPY;  Service: Cardiovascular;  Laterality: N/A;  . Transthoracic echocardiogram  Jan 2012    EF ~40-45%, global HK; PAP ~45-50 mmHg  . Nm myoview ltd  May 2013    No ischemia or Infarct  . Abdominal hysterectomy    . Cardiac catheterization  07/30/2007    75% LAD ,3 stenoses of 75-90% in  RCA;circumflex from proximal RCA with no lesion seen,normal ramus intermediate branh; normal LV systoilc function    ZOX:WRUEAVW:UJ colds or fevers, no weight changes Skin:no rashes or ulcers HEENT:no blurred vision, no congestion CV:see HPI PUL:see HPI GI:no diarrhea constipation or melena, no indigestion GU:no hematuria, no dysuria MS:no joint pain, no claudication Neuro:no syncope, no lightheadedness Endo:no diabetes, no thyroid disease  Wt Readings from Last 3 Encounters:  12/02/13 95 lb (43.092 kg)  07/07/13 99 lb 1.6 oz (44.951 kg)  01/07/13 99 lb 12.8 oz (45.269 kg)    PHYSICAL EXAM BP 106/62  Pulse 63  Ht  (1.499 m)  Wt 95 lb (43.092 kg)  BMI 19.18 kg/m2 General:Pleasant affect, NAD Skin:Warm and dry, brisk capillary refill HEENT:normocephalic, sclera clear, mucus membranes moist Neck:supple, no JVD, no bruits  Heart:S1S2 RRR without murmur, gallup, rub or click Lungs:clear without rales, rhonchi, or wheezes WJX:BJYN, non tender, + BS, do not palpate liver spleen or masses Ext:no lower ext edema, 2+ pedal pulses, 2+ radial pulses Neuro:alert and oriented, MAE, follows commands, + facial symmetry EKG:A fib rate 67 rate controlled. No acute changes otherwise.   ASSESSMENT AND PLAN Atrial fibrillation Seems more persistent at this time.  Pt would like to come off  amiodarone, her thyroid has been affected.  Her vision is not improved after cataract surgery, thought that it may be due to amiodarone.  Rate is controlled today and she is on anticoagualtion.  Will check dig level and thyroid today with her symptoms of fatigue, general not feeling well. Will then discuss with Dr. Herbie Baltimore.    CAD (coronary artery disease) No chest pain, she is on ASA and BB.  Not on ACE due to orthostatic hypotension in the past.  Plans for  cervical disc surgery in near future.  Will do lexiscan myoview, difficult to determine is she were to have angina due to decreased activity for neck pain.    Ischemic cardiomyopathy Stable. EF ~40-45% by Echo 2013  PAD (peripheral artery disease) s/p L Iliac A stent ; most recent Dopplers July 2012: Less than 50% reduction bilaterally. ABI 0.96 on the right 0.88 on left, will schedule dopplers as she is having burning sensation at times on shins.    Hypothyroidism Occurred with amiodarone, will recheck level today with her fatigue.  Hyperlipidemia She is wanting to decrease Crestor, more frustration with so many meds, no side effects, if labs are good, perhaps we could decrease dose.  Check lipids.    Follow up with Dr. Herbie Baltimore in 6 months, unless problems occur.  I will discuss stress test and amiodarone with him once the labs and stress test are done.

## 2013-12-03 ENCOUNTER — Other Ambulatory Visit: Payer: Self-pay | Admitting: Cardiology

## 2013-12-03 LAB — DIGOXIN LEVEL: Digoxin Level: 2.1 ng/mL — ABNORMAL HIGH (ref 0.8–2.0)

## 2013-12-05 ENCOUNTER — Other Ambulatory Visit: Payer: Self-pay | Admitting: Pharmacist Clinician (PhC)/ Clinical Pharmacy Specialist

## 2013-12-05 ENCOUNTER — Telehealth: Payer: Self-pay | Admitting: *Deleted

## 2013-12-05 MED ORDER — DABIGATRAN ETEXILATE MESYLATE 75 MG PO CAPS: 75.0000 mg | ORAL_CAPSULE | Freq: Two times a day (BID) | ORAL | Status: DC

## 2013-12-05 NOTE — Telephone Encounter (Signed)
Spoke to daughter,information given. Voiced understanding.  Hold digoxin Crestor hold for month restart at 1/2 tablet.

## 2013-12-05 NOTE — Telephone Encounter (Signed)
Message copied by Tobin Chad on Fri Dec 05, 2013  5:08 PM ------      Message from: Leone Brand      Created: Thu Dec 04, 2013 11:46 PM       Jasmine December, please notify pt she can hold crestor for 1 month before starting at 2.5 mg daily.  Also Stop dig for now.  She is already holding the dig.  But Dr. Herbie Baltimore said she could hold until she has surgery.  She should have stress test though. Already ordered.       ----- Message -----         From: Marykay Lex, MD         Sent: 12/04/2013  10:28 PM           To: Nada Boozer, NP            Would rather keep Amio on until following surgery - can talk about stopping after.      Am OK with stopping Crestor for ~month & restart @ 1/2 dose.       Maybe hold Dig until post-op.            HARDING,DAVID W                  ----- Message -----         From: Nada Boozer, NP         Sent: 12/03/2013   2:29 PM           To: Marykay Lex, MD            Hello, I saw Ms. Volk yesterday, Hx CABG, A fib that seems chronic now, on amiodarone because at times tachycardic.  She felt bad, no energy, so I did labs and LDL very low so I decreased crestor in half- she had wanted to stop-we compromixed.  Also dig level elevated this was done before she took so I held for 4 days and resuming at lower dose.  She needs cervical disc surgery so I ordered a lexiscan.  She feels bad and wants to stop amiodarone, thyroid stable.  I told her depended on lexiscan but they wanted to make sure I touched base with you.  Which I will do as well when nuc is back.  Let me know if above ok.  Thanks             ------

## 2013-12-09 ENCOUNTER — Telehealth: Payer: Self-pay | Admitting: Cardiology

## 2013-12-09 ENCOUNTER — Other Ambulatory Visit: Payer: Self-pay | Admitting: Cardiology

## 2013-12-09 MED ORDER — DIGOXIN 125 MCG PO TABS: 0.0625 mg | ORAL_TABLET | Freq: Every day | ORAL | Status: DC

## 2013-12-09 NOTE — Telephone Encounter (Signed)
Please Have [name] need to know again what medicine she was told to stop for a month.

## 2013-12-09 NOTE — Telephone Encounter (Signed)
Follow up ° ° ° ° °Returning a nurses call °

## 2013-12-09 NOTE — Telephone Encounter (Signed)
Forward message to sharon

## 2013-12-11 ENCOUNTER — Telehealth (HOSPITAL_COMMUNITY): Payer: Self-pay | Admitting: *Deleted

## 2013-12-11 NOTE — Telephone Encounter (Signed)
Late entry  Spoke to left message on Deedee (patient's daughter) voicemail with the information Stop Crestor for a month, and hold digoxin. Any question may call back

## 2013-12-15 ENCOUNTER — Ambulatory Visit (HOSPITAL_COMMUNITY): Payer: Medicare HMO | Attending: Internal Medicine | Admitting: Radiology

## 2013-12-15 ENCOUNTER — Encounter (HOSPITAL_COMMUNITY): Payer: Medicare HMO

## 2013-12-15 VITALS — BP 120/86 | HR 82 | Ht 59.0 in | Wt 98.0 lb

## 2013-12-15 DIAGNOSIS — I10 Essential (primary) hypertension: Secondary | ICD-10-CM | POA: Insufficient documentation

## 2013-12-15 DIAGNOSIS — R0609 Other forms of dyspnea: Secondary | ICD-10-CM | POA: Insufficient documentation

## 2013-12-15 DIAGNOSIS — J4489 Other specified chronic obstructive pulmonary disease: Secondary | ICD-10-CM | POA: Insufficient documentation

## 2013-12-15 DIAGNOSIS — R5383 Other fatigue: Secondary | ICD-10-CM | POA: Diagnosis present

## 2013-12-15 DIAGNOSIS — J449 Chronic obstructive pulmonary disease, unspecified: Secondary | ICD-10-CM | POA: Insufficient documentation

## 2013-12-15 DIAGNOSIS — R5381 Other malaise: Secondary | ICD-10-CM | POA: Insufficient documentation

## 2013-12-15 DIAGNOSIS — I4891 Unspecified atrial fibrillation: Secondary | ICD-10-CM | POA: Insufficient documentation

## 2013-12-15 DIAGNOSIS — R0989 Other specified symptoms and signs involving the circulatory and respiratory systems: Principal | ICD-10-CM | POA: Insufficient documentation

## 2013-12-15 MED ORDER — REGADENOSON 0.4 MG/5ML IV SOLN
0.4000 mg | Freq: Once | INTRAVENOUS | Status: AC
  Administered 2013-12-15: 0.4 mg via INTRAVENOUS

## 2013-12-15 MED ORDER — TECHNETIUM TC 99M SESTAMIBI GENERIC - CARDIOLITE
11.0000 | Freq: Once | INTRAVENOUS | Status: AC | PRN
  Administered 2013-12-15: 11 via INTRAVENOUS

## 2013-12-15 MED ORDER — TECHNETIUM TC 99M SESTAMIBI GENERIC - CARDIOLITE
33.0000 | Freq: Once | INTRAVENOUS | Status: AC | PRN
  Administered 2013-12-15: 33 via INTRAVENOUS

## 2013-12-15 NOTE — Progress Notes (Signed)
[name] SITE 3 NUCLEAR MED [address] [place], [place] [address] [phone]    Cardiology Nuclear Med Study  [name] is a 73 y.o. female     MRN : [id number]     DOB: Aug 05, 1940  Procedure Date: 12/15/2013  Nuclear Med Background Indication for Stress Test:  Evaluation for Ischemia, Follow-Up CAD, and Pending Surgical Clearance for Back Surgery by Garlon Hatchet, MD History:  CAD, MPI 2013 (normal), COPD, Afib Cardiac Risk Factors: Hypertension  Symptoms:  DOE and Fatigue   Nuclear Pre-Procedure Caffeine/Decaff Intake:  None> 12 hrs NPO After: 6:00pm   Lungs:  clear O2 Sat: 97% on room air. IV 0.9% NS with Angio Cath:  24g  IV Site: R Forearm x 1, tolerated well IV Started by:  Irean Hong, RN  Chest Size (in):  32 Cup Size: B  Height:  (1.499 m)  Weight:  98 lb (44.453 kg)  BMI:  Body mass index is 19.78 kg/(m^2). Tech Comments:  Patient took her Hydrocodone/Acetaminophen on arrival due to severe back pain. Irean Hong., RN.    Nuclear Med Study 1 or 2 day study: 1 day  Stress Test Type:  Eugenie Birks  Reading MD: N/A  Order Authorizing Provider:  Bryan Lemma, MD  Resting Radionuclide: Technetium 27m Sestamibi  Resting Radionuclide Dose: 11.0 mCi   Stress Radionuclide:  Technetium 49m Sestamibi  Stress Radionuclide Dose: 33.0 mCi           Stress Protocol Rest HR: 82 Stress HR: 100  Rest BP: 120/86 Stress BP: 134/92  Exercise Time (min): n/a METS: n/a           Dose of Adenosine (mg):  n/a Dose of Lexiscan: 0.4 mg  Dose of Atropine (mg): n/a Dose of Dobutamine: n/a mcg/kg/min (at max HR)  Stress Test Technologist: Nelson Chimes, BS-ES  Nuclear Technologist:  Doyne Keel, CNMT     Rest Procedure:  Myocardial perfusion imaging was performed at rest 45 minutes following the intravenous administration of Technetium 49m Sestamibi. Rest ECG: Atrial Fibrilliation  Stress Procedure:  The patient received IV Lexiscan 0.4 mg over  15-seconds.  Technetium 28m Sestamibi injected at 30-seconds.  Quantitative spect images were obtained after a 45 minute delay. Stress ECG: No significant change from baseline ECG  QPS Raw Data Images:  Normal; no motion artifact; normal heart/lung ratio. Stress Images:  Normal homogeneous uptake in all areas of the myocardium. Rest Images:  Normal homogeneous uptake in all areas of the myocardium. Subtraction (SDS):  No evidence of ischemia. Transient Ischemic Dilatation (Normal <1.22):  0.86 Lung/Heart Ratio (Normal <0.45):  0.31  Quantitative Gated Spect Images QGS EDV:  NA QGS ESV:  NA  Impression Exercise Capacity:  Lexiscan with no exercise. BP Response:  Hypotensive blood pressure response. Clinical Symptoms:  No significant symptoms noted. ECG Impression:  No significant ECG changes with Lexiscan. Comparison with Prior Nuclear Study: No significant change from previous study  Overall Impression:  Normal stress nuclear study.  LV Ejection Fraction: Study not gated.  LV Wall Motion: NA  Chrystie Nose, MD, [facility] Board Certified in Nuclear Cardiology Attending Cardiologist [facility]

## 2013-12-17 ENCOUNTER — Telehealth: Payer: Self-pay | Admitting: *Deleted

## 2013-12-17 NOTE — Telephone Encounter (Signed)
Left message on secure voicemail- Results myoview stable and keep appointment 01/2014 Any question may call back

## 2013-12-17 NOTE — Telephone Encounter (Signed)
Message copied by Tobin Chad on Wed Dec 17, 2013 11:04 AM ------      Message from: Leone Brand      Created: Tue Dec 16, 2013 11:22 AM       Stable nuc study, when was she planning back surgery?  Keep appt with Dr. Herbie Baltimore.  If he agrees she is cleared for surgery. ------

## 2013-12-19 ENCOUNTER — Other Ambulatory Visit: Payer: Self-pay | Admitting: Cardiology

## 2013-12-19 NOTE — Telephone Encounter (Signed)
Refilled #[id number] with 9 refills on 10/06/13

## 2013-12-23 ENCOUNTER — Telehealth (HOSPITAL_COMMUNITY): Payer: Self-pay | Admitting: *Deleted

## 2013-12-25 ENCOUNTER — Other Ambulatory Visit: Payer: Self-pay | Admitting: Neurosurgery

## 2014-01-13 ENCOUNTER — Ambulatory Visit (INDEPENDENT_AMBULATORY_CARE_PROVIDER_SITE_OTHER): Payer: Medicare HMO | Admitting: Cardiology

## 2014-01-13 ENCOUNTER — Other Ambulatory Visit: Payer: Self-pay | Admitting: *Deleted

## 2014-01-13 ENCOUNTER — Encounter: Payer: Self-pay | Admitting: Cardiology

## 2014-01-13 VITALS — BP 120/86 | HR 92 | Ht 59.0 in | Wt 98.9 lb

## 2014-01-13 DIAGNOSIS — I482 Chronic atrial fibrillation, unspecified: Secondary | ICD-10-CM

## 2014-01-13 DIAGNOSIS — E785 Hyperlipidemia, unspecified: Secondary | ICD-10-CM

## 2014-01-13 DIAGNOSIS — I255 Ischemic cardiomyopathy: Secondary | ICD-10-CM

## 2014-01-13 DIAGNOSIS — I739 Peripheral vascular disease, unspecified: Secondary | ICD-10-CM

## 2014-01-13 DIAGNOSIS — Z01810 Encounter for preprocedural cardiovascular examination: Secondary | ICD-10-CM

## 2014-01-13 DIAGNOSIS — I10 Essential (primary) hypertension: Secondary | ICD-10-CM

## 2014-01-13 DIAGNOSIS — I2510 Atherosclerotic heart disease of native coronary artery without angina pectoris: Secondary | ICD-10-CM

## 2014-01-13 NOTE — Progress Notes (Signed)
PCP: Herb GraysSPEAR, TAMMY, MD  Clinic Note: Chief Complaint  Patient presents with  . Follow-up    no complaints    HPI: [name] is a 73 y.o. female with a PMH (CAD-CABG, Ischemic CM - Class II ombined CHF, Chronic Afib with recurrence post MAZE, PAD)below who presents today for 2nd Pre-operative evaluation - following Myoview ST.  Seen by Shelda JakesLaura Ingold,NP-C on 9/1 -- preoperative evaluation for Cervical Disk Sgx by Dr. Wynetta Emeryram.  She did not have any notable cardiac complaints at that time.  B/c of limited mobility  - a Myoview/Cardiolite Nuclear Stress Test was ordered -- that showed no Ischemia or Infarction.   Past Medical History  Diagnosis Date  . S/P CABG x 2 2009    LIMA-LAD, SVG-RCA, with AV fistula ligation and Maze procedure  . CAD (coronary artery disease), native coronary artery 2009    75% LAD, diffuse 75-90% RCA --> Referred for CABG + MAZE;  Cardiolite 12/2013: No ischemia or Infarction  . Ischemic cardiomyopathy 2012    EF ~40-45% by Echo  . PAF (paroxysmal atrial fibrillation) 04/05/2012    s/p MAZE -- recurrence, cardioversion-converted to sinus bradycardia --> Now persistent despite being on Amiodarone & Pradaxa  . Essential hypertension   . Dyslipidemia, goal LDL below 70     On Crestor, followed by PCP  . COPD (chronic obstructive pulmonary disease)   . PAD (peripheral artery disease) 2007    s/p L Ileac A stent ; most recent Dopplers July 2012: Less than 50% reduction bilaterally. ABI 0.96 on the right 0.88 on left;;;12/27/2011   -ABI right .87 and left ABI .78  ,LEFT CIA and EIA stent normall patency, left CFA,SFA,and popliteal 0-49%; rgt proximal SFA 50-69%,rgt CIA,EIA, and CFA 0-49%  . Chronic low back pain     scoliosis & lordosis  . H/O: pneumonia   . Hypothyroidism   . Anxiety   . Osteoarthritis of back     And neck  . Cervical neck pain with evidence of disc disease     with need for surgery -- November 2015    Interval History: She presents today doing  relatively well from cardiac standpoint, no complaints of any chest tightness or pressure with rest or exertion to suggest angina. She is relatively asymptomatic from her atrial fibrillation no palpitations or rapid/irregular heartbeats. No resting or exertional dyspnea but she does really hard. She is most limited now because of her neck and back pain which limits how much activity she is able to do. She denies any PND, orthopnea or edema.Of near syncope or TIA/amaurosis fugax symptoms. No orthostatic symptoms. No claudication.  ROS: A comprehensive was performed. Review of Systems  Constitutional: Negative.   HENT: Negative for congestion and nosebleeds.   Eyes: Positive for blurred vision.       No real improvement after having cataract surgery.  Respiratory: Negative for cough, shortness of breath and wheezing.   Cardiovascular: Negative.        Per history of present illness  Gastrointestinal: Negative for heartburn, nausea, constipation, blood in stool and melena.  Genitourinary: Negative for hematuria.  Musculoskeletal: Positive for back pain and neck pain.  Neurological: Positive for tingling. Negative for headaches.  Endo/Heme/Allergies: Negative.  Does not bruise/bleed easily.  Psychiatric/Behavioral: Positive for depression and memory loss. The patient is nervous/anxious and has insomnia.   All other systems reviewed and are negative.   Current Outpatient Prescriptions on File Prior to Visit  Medication Sig Dispense Refill  .  amiodarone (PACERONE) 200 MG tablet Take 1 tablet (200 mg total) by mouth daily as directed.  90 tablet  3  . aspirin 81 MG tablet Take 81 mg by mouth daily.      . dabigatran (PRADAXA) 75 MG CAPS capsule Take 1 capsule (75 mg total) by mouth 2 (two) times daily.  60 capsule  5  . furosemide (LASIX) 40 MG tablet Take 40 mg by mouth daily.      Marland Kitchen HYDROcodone-acetaminophen (NORCO/VICODIN) 5-325 MG per tablet Take 1 tablet by mouth every 6 (six) hours as needed  for pain.      Marland Kitchen levothyroxine (SYNTHROID, LEVOTHROID) 75 MCG tablet Take 75 mcg by mouth daily before breakfast.      . metoprolol (LOPRESSOR) 50 MG tablet Take 0.5 tablets (25 mg total) by mouth 2 (two) times daily.  60 tablet  6  . potassium chloride SA (K-DUR,KLOR-CON) 20 MEQ tablet Take 20 mEq by mouth daily.       No current facility-administered medications on file prior to visit.    ALLERGIES REVIEWED IN EPIC -- no change SOCIAL AND FAMILY HISTORY REVIEWED IN EPIC -- no change  Wt Readings from Last 3 Encounters:  01/13/14 98 lb 14.4 oz (44.861 kg)  12/15/13 98 lb (44.453 kg)  12/02/13 95 lb (43.092 kg)    PHYSICAL EXAM BP 120/86  Pulse 92  Ht 4\' 11"  (1.499 m)  Wt 98 lb 14.4 oz (44.861 kg)  BMI 19.96 kg/m2 General appearance: alert, cooperative, appears stated age, no distress and otherwise healthy-appearing. Neck: no adenopathy, no carotid bruit and no JVD Lungs: clear to auscultation bilaterally, normal percussion bilaterally and non-labored Heart: regular rate with a irregularly irregular rhythm; S1 & S2 normal, no murmur, click, rub or gallop, nondisplaced PMI Abdomen: soft, non-tender; bowel sounds normal; no masses,  no organomegaly Extremities: extremities normal, atraumatic, no cyanosis, or edema  Pulses: 2+ and symmetric;  Neurologic: Mental status: Alert, oriented, thought content appropriate Cranial nerves: normal (II-XII grossly intact)   Adult ECG Report  Rate: 92 ;  Rhythm: atrial fibrillation  Narrative Interpretation: Otherwise relatively normal  Recent Labs:   Lipid Panel     Component Value Date/Time   CHOL 142 12/02/2013 1223   TRIG 83.0 12/02/2013 1223   HDL 62.10 12/02/2013 1223   CHOLHDL 2 12/02/2013 1223   VLDL 16.6 12/02/2013 1223   LDLCALC 63 12/02/2013 1223    ASSESSMENT / PLAN: Preoperative cardiovascular examination Overall stable, no symptoms were cardiac standpoint. With a negative ischemic evaluation, I would not recommend further  evaluation she doesn't moderately reduced EF. No heart failure symptoms.  PREOPERATIVE CARDIAC RISK ASSESSMENT   Revised Cardiac Risk Index:  High Risk Surgery: no; cervical spine - intermediate risk  Defined as Intraperitoneal, intrathoracic or suprainguinal vascular  Active CAD: no; status post CABG with negative Myoview  CHF: no; but has reduced ejection fraction  Cerebrovascular Disease: no;   Diabetes: NO  CKD (Cr >~ 2): no;  Total: 2  Estimated Risk of Adverse Outcome: Low-to Intermediate  Estimated Risk of MI, PE, VF/VT (Cardiac Arrest), Complete Heart Block: 0.9-3 %  ACC/AHA Guidelines for "Clearance":  Myoview/Cardiolite stress test negative for ischemia. No additional evaluation required  CONCLUSION: With underlying A. fib, Intermediate Risk Patient for Intermediate Risk Surgery  For now would continue on the amiodarone perioperatively in addition to the beta blocker in order to help with perioperative rate control of her atrial fibrillation.  Recommend stopping Pradaxa 2-5 days preoperatively based  on the surgeon's preference -- restart when seen surgically  2 Vessel CAD - s/p CABG; LIMA-LAD, SVG-RCA No active anginal symptoms. On beta blockers and aspirin. Her statin was stopped and I'm not sure why. Number next Imdur due to hypotension  Chronic atrial fibrillation She is pretty much chronically in atrial fibrillation and rate control with amiodarone. We'll plan with her dosing up and down, I think eventually we can stop amiodarone, but would continue perioperatively. She is anticoagulated on Pradaxa. Remain on beta blocker.  Essential hypertension Well-controlled on beta blocker, not on ACE inhibitor as noted above  Ischemic cardiomyopathy Unfortunately the Myoview is not needed. It would be very similar to baseline function is currently, but last evaluation was 40 and 45%. She is not having any active heart failure symptoms --  She is probably NYHA class 1-2  CHF.  PAD (peripheral artery disease) No active claudication symptoms.    Orders Placed This Encounter  Procedures  . EKG 12-Lead   No orders of the defined types were placed in this encounter.     Followup: 3 months   Walda Hertzog W, M.D., M.S. Interventional Cardiologist   Pager # [phone]

## 2014-01-13 NOTE — Assessment & Plan Note (Signed)
Unfortunately the Myoview is not needed. It would be very similar to baseline function is currently, but last evaluation was 40 and 45%. She is not having any active heart failure symptoms --  She is probably NYHA class 1-2 CHF.

## 2014-01-13 NOTE — Assessment & Plan Note (Signed)
She is pretty much chronically in atrial fibrillation and rate control with amiodarone. We'll plan with her dosing up and down, I think eventually we can stop amiodarone, but would continue perioperatively. She is anticoagulated on Pradaxa. Remain on beta blocker.

## 2014-01-13 NOTE — Assessment & Plan Note (Addendum)
Overall stable, no symptoms were cardiac standpoint. With a negative ischemic evaluation, I would not recommend further evaluation she doesn't moderately reduced EF. No heart failure symptoms.  PREOPERATIVE CARDIAC RISK ASSESSMENT   Revised Cardiac Risk Index:  High Risk Surgery: no; cervical spine - intermediate risk  Defined as Intraperitoneal, intrathoracic or suprainguinal vascular  Active CAD: no; status post CABG with negative Myoview  CHF: no; but has reduced ejection fraction  Cerebrovascular Disease: no;   Diabetes: NO  CKD (Cr >~ 2): no;  Total: 2  Estimated Risk of Adverse Outcome: Low-to Intermediate  Estimated Risk of MI, PE, VF/VT (Cardiac Arrest), Complete Heart Block: 0.9-3 %  ACC/AHA Guidelines for "Clearance":  Myoview/Cardiolite stress test negative for ischemia. No additional evaluation required  CONCLUSION: With underlying A. fib, Intermediate Risk Patient for Intermediate Risk Surgery  For now would continue on the amiodarone perioperatively in addition to the beta blocker in order to help with perioperative rate control of her atrial fibrillation.  Recommend stopping Pradaxa 2-5 days preoperatively based on the surgeon's preference -- restart when seen surgically

## 2014-01-13 NOTE — Assessment & Plan Note (Signed)
Well-controlled on beta blocker, not on ACE inhibitor as noted above

## 2014-01-13 NOTE — Patient Instructions (Signed)
CLEARANCE FOR SURGERY - INTERMEDIATED RISK YOU MAY STOP PRADAXA 5 DAYS PRIOR TO SURGERY THEN RESTART PER SURGEON  Your physician wants you to follow-up in 3 MONTH DR HARDING 30 MIN APPT. You will receive a reminder letter in the mail two months in advance. If you don't receive a letter, please call our office to schedule the follow-up appointment.

## 2014-01-13 NOTE — Assessment & Plan Note (Addendum)
No active anginal symptoms. On beta blockers and aspirin. Her statin was stopped and I'm not sure why. Number next Imdur due to hypotension

## 2014-01-13 NOTE — Assessment & Plan Note (Signed)
No active claudication symptoms. 

## 2014-01-14 ENCOUNTER — Encounter (HOSPITAL_COMMUNITY): Payer: Self-pay | Admitting: Pharmacy Technician

## 2014-01-15 ENCOUNTER — Encounter (HOSPITAL_COMMUNITY)
Admission: RE | Admit: 2014-01-15 | Discharge: 2014-01-15 | Disposition: A | Discharge status: Home / Self Care | Payer: Medicare HMO | Source: Ambulatory Visit | Attending: Anesthesiology | Admitting: Anesthesiology

## 2014-01-15 ENCOUNTER — Encounter (HOSPITAL_COMMUNITY)
Admission: RE | Admit: 2014-01-15 | Discharge: 2014-01-15 | Disposition: A | Discharge status: Home / Self Care | Payer: Medicare HMO | Source: Ambulatory Visit | Attending: Neurosurgery | Admitting: Neurosurgery

## 2014-01-15 ENCOUNTER — Encounter (HOSPITAL_COMMUNITY): Payer: Self-pay

## 2014-01-15 DIAGNOSIS — M4802 Spinal stenosis, cervical region: Secondary | ICD-10-CM | POA: Insufficient documentation

## 2014-01-15 DIAGNOSIS — Z01818 Encounter for other preprocedural examination: Secondary | ICD-10-CM | POA: Insufficient documentation

## 2014-01-15 DIAGNOSIS — Z01811 Encounter for preprocedural respiratory examination: Secondary | ICD-10-CM

## 2014-01-15 LAB — CBC
HCT: 41.2 % (ref 36.0–46.0)
HEMOGLOBIN: 12.4 g/dL (ref 12.0–15.0)
MCH: 23.7 pg — ABNORMAL LOW (ref 26.0–34.0)
MCHC: 30.1 g/dL (ref 30.0–36.0)
MCV: 78.6 fL (ref 78.0–100.0)
PLATELETS: 217 10*3/uL (ref 150–400)
RBC: 5.24 MIL/uL — AB (ref 3.87–5.11)
RDW: 17.8 % — ABNORMAL HIGH (ref 11.5–15.5)
WBC: 6.5 10*3/uL (ref 4.0–10.5)

## 2014-01-15 LAB — BASIC METABOLIC PANEL
Anion gap: 11 (ref 5–15)
BUN: 21 mg/dL (ref 6–23)
CHLORIDE: 99 meq/L (ref 96–112)
CO2: 30 mEq/L (ref 19–32)
Calcium: 9.2 mg/dL (ref 8.4–10.5)
Creatinine, Ser: 1.09 mg/dL (ref 0.50–1.10)
GFR calc Af Amer: 57 mL/min — ABNORMAL LOW (ref >90)
GFR, EST NON AFRICAN AMERICAN: 49 mL/min — AB (ref >90)
Glucose, Bld: 62 mg/dL — ABNORMAL LOW (ref 70–99)
Potassium: 4.3 mEq/L (ref 3.7–5.3)
SODIUM: 140 meq/L (ref 137–147)

## 2014-01-15 LAB — SURGICAL PCR SCREEN
MRSA, PCR: NEGATIVE
Staphylococcus aureus: NEGATIVE

## 2014-01-15 NOTE — Pre-Procedure Instructions (Signed)
Brenda LoganRobina B Glass  01/15/2014   Your procedure is scheduled on:  01-23-2014   Friday   [facility]:30 AM .  Call this number if you have problems the morning of surgery: [phone]   Remember:   Do not eat food or drink liquids after midnight.    Take these medicines the morning of surgery with A SIP OF WATER:amiodarone(Pacerone) pain  medication as needed,levothyroxine(Synthroid),metoprolol(Lopressor),Potassiun chloride    Do not wear jewelry, make-up or nail polish.  Do not wear lotions, powders, or perfumes. You may  Not wear deodorant.  Do not shave 48 hours prior to surgery. Men may shave face and neck.  Do not bring valuables to the hospital.  [facility] is not responsible for any belongings or valuables.               Contacts, dentures or bridgework may not be worn into surgery.   Leave suitcase in the car. After surgery it may be brought to your room.  For patients admitted to the hospital, discharge time is determined by your  treatment team.                  Special Instructions: SEE ATTACHED SHEET FOR INSTRUCTIONS ON CHG SHOWER/BATH     Please read over the following fact sheets that you were given: Pain Booklet and Surgical Site Infection Prevention

## 2014-01-22 MED ORDER — CEFAZOLIN SODIUM-DEXTROSE 2-3 GM-% IV SOLR
2.0000 g | INTRAVENOUS | Status: AC
  Administered 2014-01-23: 2 g via INTRAVENOUS
  Filled 2014-01-22: qty 50

## 2014-01-22 MED ORDER — DEXAMETHASONE SODIUM PHOSPHATE 10 MG/ML IJ SOLN
10.0000 mg | INTRAMUSCULAR | Status: AC
  Administered 2014-01-23: 10 mg via INTRAVENOUS
  Filled 2014-01-22: qty 1

## 2014-01-23 ENCOUNTER — Inpatient Hospital Stay (HOSPITAL_COMMUNITY)
Admission: RE | Admit: 2014-01-23 | Discharge: 2014-01-24 | DRG: 473 | Disposition: A | Discharge status: Home / Self Care | Payer: Medicare HMO | Source: Ambulatory Visit | Attending: Neurosurgery | Admitting: Neurosurgery

## 2014-01-23 ENCOUNTER — Inpatient Hospital Stay (HOSPITAL_COMMUNITY): Payer: Medicare HMO

## 2014-01-23 ENCOUNTER — Encounter (HOSPITAL_COMMUNITY)
Admission: RE | Disposition: A | Discharge status: Home / Self Care | Payer: Self-pay | Source: Ambulatory Visit | Attending: Neurosurgery

## 2014-01-23 ENCOUNTER — Encounter (HOSPITAL_COMMUNITY): Payer: Self-pay | Admitting: *Deleted

## 2014-01-23 ENCOUNTER — Inpatient Hospital Stay (HOSPITAL_COMMUNITY): Payer: Medicare HMO | Admitting: Certified Registered"

## 2014-01-23 ENCOUNTER — Encounter (HOSPITAL_COMMUNITY): Payer: Medicare HMO | Admitting: Certified Registered"

## 2014-01-23 DIAGNOSIS — J449 Chronic obstructive pulmonary disease, unspecified: Secondary | ICD-10-CM | POA: Diagnosis present

## 2014-01-23 DIAGNOSIS — I2510 Atherosclerotic heart disease of native coronary artery without angina pectoris: Secondary | ICD-10-CM | POA: Diagnosis present

## 2014-01-23 DIAGNOSIS — E785 Hyperlipidemia, unspecified: Secondary | ICD-10-CM | POA: Diagnosis present

## 2014-01-23 DIAGNOSIS — M4802 Spinal stenosis, cervical region: Secondary | ICD-10-CM | POA: Diagnosis present

## 2014-01-23 DIAGNOSIS — I10 Essential (primary) hypertension: Secondary | ICD-10-CM | POA: Diagnosis present

## 2014-01-23 DIAGNOSIS — E039 Hypothyroidism, unspecified: Secondary | ICD-10-CM | POA: Diagnosis present

## 2014-01-23 DIAGNOSIS — I480 Paroxysmal atrial fibrillation: Secondary | ICD-10-CM | POA: Diagnosis present

## 2014-01-23 DIAGNOSIS — Z951 Presence of aortocoronary bypass graft: Secondary | ICD-10-CM | POA: Diagnosis not present

## 2014-01-23 DIAGNOSIS — Z7901 Long term (current) use of anticoagulants: Secondary | ICD-10-CM | POA: Diagnosis not present

## 2014-01-23 DIAGNOSIS — I255 Ischemic cardiomyopathy: Secondary | ICD-10-CM | POA: Diagnosis present

## 2014-01-23 DIAGNOSIS — Z79899 Other long term (current) drug therapy: Secondary | ICD-10-CM | POA: Diagnosis not present

## 2014-01-23 DIAGNOSIS — Z7982 Long term (current) use of aspirin: Secondary | ICD-10-CM

## 2014-01-23 DIAGNOSIS — M542 Cervicalgia: Secondary | ICD-10-CM | POA: Diagnosis present

## 2014-01-23 DIAGNOSIS — Z87891 Personal history of nicotine dependence: Secondary | ICD-10-CM

## 2014-01-23 DIAGNOSIS — M4712 Other spondylosis with myelopathy, cervical region: Principal | ICD-10-CM | POA: Diagnosis present

## 2014-01-23 HISTORY — PX: ANTERIOR CERVICAL DECOMP/DISCECTOMY FUSION: SHX1161

## 2014-01-23 SURGERY — ANTERIOR CERVICAL DECOMPRESSION/DISCECTOMY FUSION 3 LEVELS
Anesthesia: General

## 2014-01-23 MED ORDER — PHENYLEPHRINE HCL 10 MG/ML IJ SOLN
INTRAMUSCULAR | Status: DC | PRN
  Administered 2014-01-23: 40 ug via INTRAVENOUS

## 2014-01-23 MED ORDER — POTASSIUM CHLORIDE CRYS ER 20 MEQ PO TBCR
20.0000 meq | EXTENDED_RELEASE_TABLET | Freq: Every day | ORAL | Status: DC
  Administered 2014-01-24: 20 meq via ORAL
  Filled 2014-01-23 (×2): qty 1

## 2014-01-23 MED ORDER — METOPROLOL TARTRATE 25 MG PO TABS
25.0000 mg | ORAL_TABLET | Freq: Two times a day (BID) | ORAL | Status: DC
  Administered 2014-01-24: 25 mg via ORAL
  Filled 2014-01-23: qty 1

## 2014-01-23 MED ORDER — THROMBIN 5000 UNITS EX SOLR
OROMUCOSAL | Status: DC | PRN
  Administered 2014-01-23 (×2): via TOPICAL

## 2014-01-23 MED ORDER — ACETAMINOPHEN 650 MG RE SUPP: 650.0000 mg | RECTAL | Status: DC | PRN

## 2014-01-23 MED ORDER — FENTANYL CITRATE 0.05 MG/ML IJ SOLN
INTRAMUSCULAR | Status: AC
  Filled 2014-01-23: qty 5

## 2014-01-23 MED ORDER — CYCLOBENZAPRINE HCL 10 MG PO TABS
10.0000 mg | ORAL_TABLET | Freq: Three times a day (TID) | ORAL | Status: DC | PRN
  Filled 2014-01-23: qty 1

## 2014-01-23 MED ORDER — SODIUM CHLORIDE 0.9 % IJ SOLN
3.0000 mL | Freq: Two times a day (BID) | INTRAMUSCULAR | Status: DC
  Administered 2014-01-23: 3 mL via INTRAVENOUS

## 2014-01-23 MED ORDER — GLYCOPYRROLATE 0.2 MG/ML IJ SOLN
INTRAMUSCULAR | Status: DC | PRN
  Administered 2014-01-23: 0.4 mg via INTRAVENOUS

## 2014-01-23 MED ORDER — AMIODARONE HCL 200 MG PO TABS
200.0000 mg | ORAL_TABLET | Freq: Every day | ORAL | Status: DC
  Administered 2014-01-24: 200 mg via ORAL
  Filled 2014-01-23: qty 1

## 2014-01-23 MED ORDER — FUROSEMIDE 40 MG PO TABS
40.0000 mg | ORAL_TABLET | Freq: Every day | ORAL | Status: DC
  Administered 2014-01-24: 40 mg via ORAL
  Filled 2014-01-23 (×2): qty 1

## 2014-01-23 MED ORDER — MENTHOL 3 MG MT LOZG: 1.0000 | LOZENGE | OROMUCOSAL | Status: DC | PRN

## 2014-01-23 MED ORDER — HYDROCODONE-ACETAMINOPHEN 5-325 MG PO TABS: 0.5000 | ORAL_TABLET | Freq: Four times a day (QID) | ORAL | Status: DC | PRN

## 2014-01-23 MED ORDER — TIZANIDINE HCL 2 MG PO TABS: 2.0000 mg | ORAL_TABLET | Freq: Once | ORAL | Status: DC

## 2014-01-23 MED ORDER — OXYCODONE-ACETAMINOPHEN 5-325 MG PO TABS: 1.0000 | ORAL_TABLET | ORAL | Status: DC | PRN

## 2014-01-23 MED ORDER — NEOSTIGMINE METHYLSULFATE 10 MG/10ML IV SOLN
INTRAVENOUS | Status: DC | PRN
  Administered 2014-01-23: 3 mg via INTRAVENOUS

## 2014-01-23 MED ORDER — LACTATED RINGERS IV SOLN
INTRAVENOUS | Status: DC | PRN
  Administered 2014-01-23 (×2): via INTRAVENOUS

## 2014-01-23 MED ORDER — LIDOCAINE HCL (CARDIAC) 20 MG/ML IV SOLN
INTRAVENOUS | Status: AC
  Filled 2014-01-23: qty 10

## 2014-01-23 MED ORDER — ARTIFICIAL TEARS OP OINT
TOPICAL_OINTMENT | OPHTHALMIC | Status: AC
  Filled 2014-01-23: qty 3.5

## 2014-01-23 MED ORDER — ONDANSETRON HCL 4 MG/2ML IJ SOLN
4.0000 mg | INTRAMUSCULAR | Status: DC | PRN
  Filled 2014-01-23: qty 2

## 2014-01-23 MED ORDER — ONDANSETRON HCL 4 MG/2ML IJ SOLN
INTRAMUSCULAR | Status: AC
Start: 2014-01-23 — End: 2014-01-23
  Filled 2014-01-23: qty 2

## 2014-01-23 MED ORDER — DOCUSATE SODIUM 100 MG PO CAPS
100.0000 mg | ORAL_CAPSULE | Freq: Two times a day (BID) | ORAL | Status: DC
  Administered 2014-01-23 – 2014-01-24 (×2): 100 mg via ORAL
  Filled 2014-01-23 (×2): qty 1

## 2014-01-23 MED ORDER — FENTANYL CITRATE 0.05 MG/ML IJ SOLN
INTRAMUSCULAR | Status: DC | PRN
  Administered 2014-01-23: 50 ug via INTRAVENOUS
  Administered 2014-01-23: 100 ug via INTRAVENOUS
  Administered 2014-01-23: 50 ug via INTRAVENOUS

## 2014-01-23 MED ORDER — GLYCOPYRROLATE 0.2 MG/ML IJ SOLN
INTRAMUSCULAR | Status: AC
  Filled 2014-01-23: qty 2

## 2014-01-23 MED ORDER — PROPOFOL 10 MG/ML IV BOLUS
INTRAVENOUS | Status: AC
  Filled 2014-01-23: qty 20

## 2014-01-23 MED ORDER — LIDOCAINE HCL 4 % MT SOLN
OROMUCOSAL | Status: DC | PRN
  Administered 2014-01-23: 5 mL via TOPICAL

## 2014-01-23 MED ORDER — OXYCODONE-ACETAMINOPHEN 5-325 MG PO TABS
1.0000 | ORAL_TABLET | ORAL | Status: DC | PRN
  Administered 2014-01-24: 2 via ORAL
  Filled 2014-01-23: qty 2

## 2014-01-23 MED ORDER — ONDANSETRON HCL 4 MG/2ML IJ SOLN
INTRAMUSCULAR | Status: DC | PRN
  Administered 2014-01-23: 4 mg via INTRAVENOUS

## 2014-01-23 MED ORDER — ACETAMINOPHEN 325 MG PO TABS: 650.0000 mg | ORAL_TABLET | ORAL | Status: DC | PRN

## 2014-01-23 MED ORDER — LIDOCAINE HCL (CARDIAC) 20 MG/ML IV SOLN
INTRAVENOUS | Status: DC | PRN
  Administered 2014-01-23: 60 mg via INTRAVENOUS

## 2014-01-23 MED ORDER — EPHEDRINE SULFATE 50 MG/ML IJ SOLN
INTRAMUSCULAR | Status: AC
  Filled 2014-01-23: qty 1

## 2014-01-23 MED ORDER — NEOSTIGMINE METHYLSULFATE 10 MG/10ML IV SOLN
INTRAVENOUS | Status: AC
  Filled 2014-01-23: qty 1

## 2014-01-23 MED ORDER — ROCURONIUM BROMIDE 50 MG/5ML IV SOLN
INTRAVENOUS | Status: AC
  Filled 2014-01-23: qty 1

## 2014-01-23 MED ORDER — HYDROMORPHONE HCL 1 MG/ML IJ SOLN: 0.2500 mg | INTRAMUSCULAR | Status: DC | PRN

## 2014-01-23 MED ORDER — SODIUM CHLORIDE 0.9 % IJ SOLN
INTRAMUSCULAR | Status: AC
  Filled 2014-01-23: qty 10

## 2014-01-23 MED ORDER — SODIUM CHLORIDE 0.9 % IJ SOLN: 3.0000 mL | INTRAMUSCULAR | Status: DC | PRN

## 2014-01-23 MED ORDER — CEFAZOLIN SODIUM 1-5 GM-% IV SOLN
1.0000 g | Freq: Three times a day (TID) | INTRAVENOUS | Status: AC
  Administered 2014-01-23 (×2): 1 g via INTRAVENOUS
  Filled 2014-01-23 (×2): qty 50

## 2014-01-23 MED ORDER — SUCCINYLCHOLINE CHLORIDE 20 MG/ML IJ SOLN
INTRAMUSCULAR | Status: AC
  Filled 2014-01-23: qty 1

## 2014-01-23 MED ORDER — DABIGATRAN ETEXILATE MESYLATE 75 MG PO CAPS: 75.0000 mg | ORAL_CAPSULE | Freq: Two times a day (BID) | ORAL | Status: DC

## 2014-01-23 MED ORDER — LEVOTHYROXINE SODIUM 50 MCG PO TABS
75.0000 ug | ORAL_TABLET | Freq: Every day | ORAL | Status: DC
  Administered 2014-01-24: 75 ug via ORAL
  Filled 2014-01-23 (×2): qty 1

## 2014-01-23 MED ORDER — PHENOL 1.4 % MT LIQD: 1.0000 | OROMUCOSAL | Status: DC | PRN

## 2014-01-23 MED ORDER — PHENYLEPHRINE HCL 10 MG/ML IJ SOLN
10.0000 mg | INTRAVENOUS | Status: DC | PRN
  Administered 2014-01-23: 50 ug/min via INTRAVENOUS

## 2014-01-23 MED ORDER — SODIUM CHLORIDE 0.9 % IV SOLN
250.0000 mL | INTRAVENOUS | Status: DC
  Administered 2014-01-23: 250 mL via INTRAVENOUS

## 2014-01-23 MED ORDER — ONDANSETRON HCL 4 MG/2ML IJ SOLN: 4.0000 mg | Freq: Once | INTRAMUSCULAR | Status: DC | PRN

## 2014-01-23 MED ORDER — PROPOFOL 10 MG/ML IV BOLUS
INTRAVENOUS | Status: DC | PRN
  Administered 2014-01-23: 140 mg via INTRAVENOUS

## 2014-01-23 MED ORDER — ARTIFICIAL TEARS OP OINT
TOPICAL_OINTMENT | OPHTHALMIC | Status: DC | PRN
  Administered 2014-01-23: 1 via OPHTHALMIC

## 2014-01-23 MED ORDER — ROCURONIUM BROMIDE 100 MG/10ML IV SOLN
INTRAVENOUS | Status: DC | PRN
  Administered 2014-01-23: 10 mg via INTRAVENOUS
  Administered 2014-01-23: 25 mg via INTRAVENOUS
  Administered 2014-01-23: 15 mg via INTRAVENOUS

## 2014-01-23 MED ORDER — HYDROMORPHONE HCL 1 MG/ML IJ SOLN
0.5000 mg | INTRAMUSCULAR | Status: DC | PRN
  Administered 2014-01-23 – 2014-01-24 (×3): 1 mg via INTRAVENOUS
  Filled 2014-01-23 (×3): qty 1

## 2014-01-23 MED ORDER — SODIUM CHLORIDE 0.9 % IR SOLN
Status: DC | PRN
  Administered 2014-01-23: 08:00:00

## 2014-01-23 MED ORDER — THROMBIN 20000 UNITS EX SOLR
CUTANEOUS | Status: DC | PRN
  Administered 2014-01-23: 07:00:00 via TOPICAL

## 2014-01-23 MED ORDER — 0.9 % SODIUM CHLORIDE (POUR BTL) OPTIME
TOPICAL | Status: DC | PRN
  Administered 2014-01-23: 1000 mL

## 2014-01-23 MED ORDER — ASPIRIN 81 MG PO CHEW
81.0000 mg | CHEWABLE_TABLET | Freq: Every day | ORAL | Status: DC
  Administered 2014-01-24: 81 mg via ORAL
  Filled 2014-01-23 (×3): qty 1

## 2014-01-23 SURGICAL SUPPLY — 70 items
ADH SKN CLS APL DERMABOND .7 (GAUZE/BANDAGES/DRESSINGS) ×1
APL SKNCLS STERI-STRIP NONHPOA (GAUZE/BANDAGES/DRESSINGS) ×1
BAG DECANTER FOR FLEXI CONT (MISCELLANEOUS) ×3 IMPLANT
BENZOIN TINCTURE PRP APPL 2/3 (GAUZE/BANDAGES/DRESSINGS) ×3 IMPLANT
BIT DRILL 13 (BIT) ×1 IMPLANT
BIT DRILL 13MM (BIT) ×1
BONE ALLOSTEM CUBE 1CC/10MM3 (Bone Implant) ×2 IMPLANT
BRUSH SCRUB EZ PLAIN DRY (MISCELLANEOUS) ×3 IMPLANT
BUR MATCHSTICK NEURO 3.0 LAGG (BURR) ×3 IMPLANT
CANISTER SUCT 3000ML (MISCELLANEOUS) ×3 IMPLANT
CLOSURE WOUND 1/2 X4 (GAUZE/BANDAGES/DRESSINGS) ×1
CONT SPEC 4OZ CLIKSEAL STRL BL (MISCELLANEOUS) ×3 IMPLANT
DECANTER SPIKE VIAL GLASS SM (MISCELLANEOUS) ×3 IMPLANT
DERMABOND ADVANCED (GAUZE/BANDAGES/DRESSINGS) ×2
DERMABOND ADVANCED .7 DNX12 (GAUZE/BANDAGES/DRESSINGS) ×1 IMPLANT
DRAPE C-ARM 42X72 X-RAY (DRAPES) ×6 IMPLANT
DRAPE LAPAROTOMY 100X72 PEDS (DRAPES) ×3 IMPLANT
DRAPE MICROSCOPE LEICA (MISCELLANEOUS) ×3 IMPLANT
DRAPE POUCH INSTRU U-SHP 10X18 (DRAPES) ×3 IMPLANT
DRSG OPSITE POSTOP 4X6 (GAUZE/BANDAGES/DRESSINGS) ×3 IMPLANT
DURAPREP 6ML APPLICATOR 50/CS (WOUND CARE) ×3 IMPLANT
ELECT COATED BLADE 2.86 ST (ELECTRODE) ×3 IMPLANT
ELECT REM PT RETURN 9FT ADLT (ELECTROSURGICAL) ×3
ELECTRODE REM PT RTRN 9FT ADLT (ELECTROSURGICAL) ×1 IMPLANT
GAUZE SPONGE 4X4 12PLY STRL (GAUZE/BANDAGES/DRESSINGS) ×3 IMPLANT
GAUZE SPONGE 4X4 16PLY XRAY LF (GAUZE/BANDAGES/DRESSINGS) IMPLANT
GLOVE BIO SURGEON STRL SZ8 (GLOVE) ×3 IMPLANT
GLOVE ECLIPSE 6.5 STRL STRAW (GLOVE) ×2 IMPLANT
GLOVE ECLIPSE 7.5 STRL STRAW (GLOVE) ×4 IMPLANT
GLOVE EXAM NITRILE LRG STRL (GLOVE) IMPLANT
GLOVE EXAM NITRILE MD LF STRL (GLOVE) IMPLANT
GLOVE EXAM NITRILE XL STR (GLOVE) IMPLANT
GLOVE EXAM NITRILE XS STR PU (GLOVE) IMPLANT
GLOVE INDICATOR 7.5 STRL GRN (GLOVE) ×6 IMPLANT
GLOVE INDICATOR 8.5 STRL (GLOVE) ×3 IMPLANT
GOWN STRL REUS W/ TWL LRG LVL3 (GOWN DISPOSABLE) IMPLANT
GOWN STRL REUS W/ TWL XL LVL3 (GOWN DISPOSABLE) ×1 IMPLANT
GOWN STRL REUS W/TWL 2XL LVL3 (GOWN DISPOSABLE) IMPLANT
GOWN STRL REUS W/TWL LRG LVL3 (GOWN DISPOSABLE) ×3
GOWN STRL REUS W/TWL XL LVL3 (GOWN DISPOSABLE) ×6
HALTER HD/CHIN CERV TRACTION D (MISCELLANEOUS) ×3 IMPLANT
HEMOSTAT POWDER KIT SURGIFOAM (HEMOSTASIS) ×3 IMPLANT
HEMOSTAT POWDER SURGIFOAM 1G (HEMOSTASIS) ×4 IMPLANT
KIT BASIN OR (CUSTOM PROCEDURE TRAY) ×3 IMPLANT
KIT ROOM TURNOVER OR (KITS) ×3 IMPLANT
NDL SPNL 20GX3.5 QUINCKE YW (NEEDLE) ×1 IMPLANT
NEEDLE SPNL 20GX3.5 QUINCKE YW (NEEDLE) ×3 IMPLANT
NS IRRIG 1000ML POUR BTL (IV SOLUTION) ×3 IMPLANT
PACK LAMINECTOMY NEURO (CUSTOM PROCEDURE TRAY) ×3 IMPLANT
PIN DISTRACTION 14MM (PIN) ×4 IMPLANT
PLATE 3 55XLCK NS SPNE CVD (Plate) IMPLANT
PLATE 3 ATLANTIS TRANS (Plate) ×3 IMPLANT
RUBBERBAND STERILE (MISCELLANEOUS) ×6 IMPLANT
SCREW ST FIX 4 ATL 3120213 (Screw) ×16 IMPLANT
SPACER CERV FRGE 12X14X6-0 (Spacer) ×2 IMPLANT
SPACER CERVICAL FRGE 12X14X6-7 (Spacer) ×2 IMPLANT
SPACER CERVICAL FRGE 12X14X7-7 (Spacer) ×2 IMPLANT
SPACER CERVICAL FRGE 12X14X8-7 (Spacer) ×2 IMPLANT
SPONGE INTESTINAL PEANUT (DISPOSABLE) ×3 IMPLANT
SPONGE SURGIFOAM ABS GEL 100 (HEMOSTASIS) ×3 IMPLANT
STRIP CLOSURE SKIN 1/2X4 (GAUZE/BANDAGES/DRESSINGS) ×2 IMPLANT
SUT VIC AB 3-0 SH 8-18 (SUTURE) ×3 IMPLANT
SUT VICRYL 4-0 PS2 18IN ABS (SUTURE) ×3 IMPLANT
SYR 20ML ECCENTRIC (SYRINGE) ×3 IMPLANT
TAPE CLOTH 4X10 WHT NS (GAUZE/BANDAGES/DRESSINGS) IMPLANT
TOWEL OR 17X24 6PK STRL BLUE (TOWEL DISPOSABLE) ×3 IMPLANT
TOWEL OR 17X26 10 PK STRL BLUE (TOWEL DISPOSABLE) ×3 IMPLANT
TRAP SPECIMEN MUCOUS 40CC (MISCELLANEOUS) ×3 IMPLANT
TRAY FOLEY CATH 14FRSI W/METER (CATHETERS) ×2 IMPLANT
WATER STERILE IRR 1000ML POUR (IV SOLUTION) ×3 IMPLANT

## 2014-01-23 NOTE — Op Note (Signed)
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   postoperative diagnosis: Same  Procedure: Anterior cervical discectomies and fusion at C4-5, C5-6, C6-7 using allograft wedges and Atlantis translational plating system  Surgeon: Jillyn HiddenGary Nelsie Domino  Assistant: Coletta MemosKyle Cabbell  Paragraph anesthesia: Gen.  Paragraph EBL: Minimal  History of present illness: Patient is 73 year old female is a progress worsening neck pain over last several years rating to the base of her neck shoulders refractory to all forms of conservative treatment workup revealed severe cervical spinal stenosis and spinal cord compression predominantly at C5-6 but also at C4-5 and C6-7. Due to her failure conservative treatment imaging findings and progression of clinical syndrome I recommended anterior cervical discectomies and fusion at these levels. I extensively reviewed the risks and benefits of the operation the patient as well as perioperative course and expectations of outcome and alternatives of surgery and she understands and agrees to proceed forward.  Operative procedure: Patient brought into the or was induced under general anesthesia positioned supine the neck in slight extension in 5 pounds of halter traction the right side of her neck was prepped and draped in routine sterile fashion. Preoperative x-ray lower localize the appropriate level so a curvilinear incision was made just off midline to the interbody the sternomastoid and the superficial layer of the platysmas dissected out and divided longitudinally the avascular plane to sternomastoid and strap as was developed and the. Fashioned professes acid with Kitners. Interoperative X. identify the appropriate level so the longus [place] as reflected laterally and Misener-retaining retractors were placed. Large anterior aspect of did not at C4-5 C5-6 and C6-7 with Leksell rongeur disc space were all incised scraped with a BA curette drill  down the posterior annulus and a 6 complex. Under microscopic illumination first working at C5-6 I initially attempted to place distracting pins however the bone quality was so poor the inferior distraction pin broke into the disc space at C6-7. So I removed the distracting pins. Aggressive undergoing of both endplates allowed decompression central canal there was marked degenerative collapse at C5-6 and severe stenosis. This is all aggressively under bitten and marking laterally both C6 pedicles were identified both C6 nerve roots were skeletonized flush with pedicle. Then L. endplates were scraped to a size 6 mm wedge was placed proximally 1-2 mm deep with the anterior vertebra . Then working at C6-7 this disc space is drilled down because of the large ostia complex and the C6 vertebral body the body was very irregular I did scraped the endplates and get some good cartilaginous endplate to leave intact aggressive and viable template decompress the central canal removed and the PLL marking laterally both C7 nerve roots skeletonized flush with pedicle. Then a size 8 mm allograft was inserted C6-7 then working at C4-5 and a similar fashion C4-5 was drilled down aggressive abutting both endplates a decompressive canal here as well both C5 nerve roots were identified both C5 pedicles were identified both C5 nerve root to decompress. A 6 mm cage was inserted here initially a 7 head inserted this is to pick so I removed it and placed a 6 parallel and this was appropriate size. Then Atlantis transitional plate was selected the screws all had excellent purchase locking mechanisms were engaged posterior fluoroscopy confirmed good position of plate all the implants a small cubital was placed underneath the plate laterally to 6-7 cage. Then a drain was placed the wounds closed in layers with after Vicryl the skin was closed  running 4 subcuticular benzoin Steri-Strips and a sterile dressing was applied patient recovered in  stable condition.

## 2014-01-23 NOTE — Anesthesia Postprocedure Evaluation (Signed)
  Anesthesia Post-op Note  Patient: [name]  Procedure(s) Performed: Procedure(s): ANTERIOR CERVICAL DECOMPRESSION/DISCECTOMY FUSION CERVICAL FOUR-FIVE,CERVICAL FIVE-SIX,CERVICAL SIX-SEVEN (N/A)  Patient Location: PACU  Anesthesia Type:General  Level of Consciousness: awake, oriented, sedated and patient cooperative  Airway and Oxygen Therapy: Patient Spontanous Breathing  Post-op Pain: mild  Post-op Assessment: Post-op Vital signs reviewed, Patient's Cardiovascular Status Stable, Respiratory Function Stable, Patent Airway and No signs of Nausea or vomiting  Post-op Vital Signs: stable  Last Vitals:  Filed Vitals:   01/23/14 1100  BP: 137/81  Pulse: 85  Temp:   Resp: 24    Complications: No apparent anesthesia complications

## 2014-01-23 NOTE — Transfer of Care (Signed)
Immediate Anesthesia Transfer of Care Note  Patient: [name]  Procedure(s) Performed: Procedure(s): ANTERIOR CERVICAL DECOMPRESSION/DISCECTOMY FUSION CERVICAL FOUR-FIVE,CERVICAL FIVE-SIX,CERVICAL SIX-SEVEN (N/A)  Patient Location: PACU  Anesthesia Type:General  Level of Consciousness: awake, alert  and oriented  Airway & Oxygen Therapy: Patient Spontanous Breathing and Patient connected to nasal cannula oxygen  Post-op Assessment: Report given to PACU RN, Post -op Vital signs reviewed and stable and Patient moving all extremities X 4  Post vital signs: Reviewed and stable  Complications: No apparent anesthesia complications

## 2014-01-23 NOTE — Discharge Summary (Signed)
  Physician Discharge Summary  Patient ID: [name] MRN: [id number] DOB/AGE: 75/07/1940 73 y.o.  Admit date: 01/23/2014 Discharge date: 01/23/2014  Admission Diagnoses: Cervical spondylosis with myelopathy and stenosis at C4-5, C5-6, C6-7  Discharge Diagnoses: Same Active Problems:   Spinal stenosis in cervical region   Discharged Condition: good  Hospital Course: Patient admitted hospital underwent ACDF at C4-5, C5-6, C6-7. Postop patient did very well recovered in the floor on the floor she was awake alert significant improvement preoperative pain she will be mobilized in a bit overnight and if she stable will be discharged in the morning. Her scheduled followup in 2 weeks.  Consults: Significant Diagnostic Studies: Treatments: ACDF C4-5 C5-6 C6-7 Discharge Exam: Blood pressure 120/78, pulse 92, temperature 97 F (36.1 C), temperature source Axillary, resp. rate 18, height 4\' 11"  (1.499 m), weight 50.259 kg (110 lb 12.8 oz), SpO2 95.00%. Strength out of 5 wound clean dry and intact.  Disposition: Home     Medication List         amiodarone 200 MG tablet  Commonly known as:  PACERONE  Take 1 tablet (200 mg total) by mouth daily as directed.     aspirin 81 MG tablet  Take 81 mg by mouth daily.     dabigatran 75 MG Caps capsule  Commonly known as:  PRADAXA  Take 1 capsule (75 mg total) by mouth 2 (two) times daily.     furosemide 40 MG tablet  Commonly known as:  LASIX  Take 40 mg by mouth daily.     HYDROcodone-acetaminophen 5-325 MG per tablet  Commonly known as:  NORCO/VICODIN  Take 0.5 tablets by mouth every 6 (six) hours as needed for moderate pain.     levothyroxine 75 MCG tablet  Commonly known as:  SYNTHROID, LEVOTHROID  Take 75 mcg by mouth daily before breakfast.     metoprolol 50 MG tablet  Commonly known as:  LOPRESSOR  Take 0.5 tablets (25 mg total) by mouth 2 (two) times daily.     oxyCODONE-acetaminophen 5-325 MG per tablet  Commonly  known as:  PERCOCET/ROXICET  Take 1-2 tablets by mouth every 4 (four) hours as needed for moderate pain.     potassium chloride SA 20 MEQ tablet  Commonly known as:  K-DUR,KLOR-CON  Take 20 mEq by mouth daily.     tiZANidine 2 MG tablet  Commonly known as:  ZANAFLEX  Take 1 tablet (2 mg total) by mouth once.           Follow-up Information   [facility] with [facility],[name], MD.   Specialty:  Neurosurgery   Contact information:   [address] [place] [address] [phone]       Signed: Nethan Caudillo P 01/23/2014, 4:18 PM

## 2014-01-23 NOTE — Progress Notes (Signed)
UR complete.  Lalia Loudon RN, MSN 

## 2014-01-23 NOTE — Progress Notes (Signed)
Pt arrived to the unit from PACU.  Pt welcomed and settled in.  VSS, assessment complete.  Soft collar in place, JP drain to suction.  No c/o pain or discomfort.  Call bell within reach, daughter at bedside.  Will continue to monitor. Sondra ComeSilva, Khye Hochstetler M, RN

## 2014-01-23 NOTE — Anesthesia Preprocedure Evaluation (Addendum)
Anesthesia Evaluation  Patient identified by MRN, date of birth, ID band Patient awake    Reviewed: Allergy & Precautions, H&P , NPO status , Patient's Chart, lab work & pertinent test results, reviewed documented beta blocker date and time   Airway Mallampati: I TM Distance: >3 FB Neck ROM: Full    Dental  (+) Dental Advisory Given, Missing, Poor Dentition   Pulmonary COPDformer smoker,          Cardiovascular hypertension, + CAD, + CABG and + Peripheral Vascular Disease + dysrhythmias Atrial Fibrillation     Neuro/Psych Anxiety    GI/Hepatic   Endo/Other  Hypothyroidism   Renal/GU      Musculoskeletal  (+) Arthritis -,   Abdominal   Peds  Hematology   Anesthesia Other Findings   Reproductive/Obstetrics                          Anesthesia Physical Anesthesia Plan  ASA: III  Anesthesia Plan: General   Post-op Pain Management:    Induction: Intravenous  Airway Management Planned: Oral ETT  Additional Equipment:   Intra-op Plan:   Post-operative Plan: Extubation in OR  Informed Consent: I have reviewed the patients History and Physical, chart, labs and discussed the procedure including the risks, benefits and alternatives for the proposed anesthesia with the patient or authorized representative who has indicated his/her understanding and acceptance.     Plan Discussed with: CRNA, Anesthesiologist and Surgeon  Anesthesia Plan Comments:         Anesthesia Quick Evaluation

## 2014-01-23 NOTE — Discharge Instructions (Signed)
No lifting no bending no twisting no driving a riding a car less she's come back and forth to see me. Keep incision clean dry and intact.

## 2014-01-23 NOTE — Anesthesia Procedure Notes (Signed)
Procedure Name: Intubation Date/Time: 01/23/2014 7:33 AM Performed by: Lanell MatarBAKER, Talise Sligh M Pre-anesthesia Checklist: Patient identified, Timeout performed, Emergency Drugs available, Suction available and Patient being monitored Patient Re-evaluated:Patient Re-evaluated prior to inductionOxygen Delivery Method: Circle system utilized Preoxygenation: Pre-oxygenation with 100% oxygen Intubation Type: IV induction Ventilation: Mask ventilation without difficulty Laryngoscope Size: Miller and 2 Grade View: Grade I Tube type: Oral Tube size: 7.0 mm Number of attempts: 1 Airway Equipment and Method: Stylet Placement Confirmation: ETT inserted through vocal cords under direct vision,  positive ETCO2 and breath sounds checked- equal and bilateral Secured at: 21 cm Tube secured with: Tape Dental Injury: Teeth and Oropharynx as per pre-operative assessment

## 2014-01-23 NOTE — Progress Notes (Signed)
MEDICATION RELATED CONSULT NOTE - INITIAL   Pharmacy Consult for med consuld Indication: Pradaxa  No Known Allergies  Patient Measurements: Height: 4\' 11"  (149.9 cm) Weight: 110 lb 12.8 oz (50.259 kg) IBW/kg (Calculated) : 43.2 Adjusted Body Weight:   Vital Signs: Temp: 97 F (36.1 C) (10/23 1314) Temp Source: Axillary (10/23 1314) BP: 120/78 mmHg (10/23 1314) Pulse Rate: 92 (10/23 1314) Intake/Output from previous day:   Intake/Output from this shift: Total I/O In: 1800 [I.V.:1800] Out: 365 [Urine:140; Drains:25; Blood:200]  Labs: No results found for this basename: WBC, HGB, HCT, PLT, APTT, CREATININE, LABCREA, CREATININE, CREAT24HRUR, MG, PHOS, ALBUMIN, PROT, ALBUMIN, AST, ALT, ALKPHOS, BILITOT, BILIDIR, IBILI,  in the last 72 hours Estimated Creatinine Clearance: 30.9 ml/min (by C-G formula based on Cr of 1.09).   Microbiology: Recent Results (from the past 720 hour(s))  SURGICAL PCR SCREEN     Status: None   Collection Time    01/15/14  1:51 PM      Result Value Ref Range Status   MRSA, PCR NEGATIVE  NEGATIVE Final   Staphylococcus aureus NEGATIVE  NEGATIVE Final   Comment:            The Xpert SA Assay (FDA     approved for NASAL specimens     in patients over 74 years of age),     is one component of     a comprehensive surveillance     program.  Test performance has     been validated by [facility] for patients greater     than or equal to 74 year old.     It is not intended     to diagnose infection nor to     guide or monitor treatment.    Medical History: Past Medical History  Diagnosis Date  . S/P CABG x 2 2009    LIMA-LAD, SVG-RCA, with AV fistula ligation and Maze procedure  . CAD (coronary artery disease), native coronary artery 2009    75% LAD, diffuse 75-90% RCA --> Referred for CABG + MAZE;  Cardiolite 12/2013: No ischemia or Infarction  . Ischemic cardiomyopathy 2012    EF ~40-45% by Echo  . PAF (paroxysmal atrial fibrillation)  04/05/2012    s/p MAZE -- recurrence, cardioversion-converted to sinus bradycardia --> Now persistent despite being on Amiodarone & Pradaxa  . Essential hypertension   . Dyslipidemia, goal LDL below 70     On Crestor, followed by PCP  . COPD (chronic obstructive pulmonary disease)   . PAD (peripheral artery disease) 2007    s/p L Ileac A stent ; most recent Dopplers July 2012: Less than 50% reduction bilaterally. ABI 0.96 on the right 0.88 on left;;;12/27/2011   -ABI right .87 and left ABI .78  ,LEFT CIA and EIA stent normall patency, left CFA,SFA,and popliteal 0-49%; rgt proximal SFA 50-69%,rgt CIA,EIA, and CFA 0-49%  . Chronic low back pain     scoliosis & lordosis  . H/O: pneumonia   . Hypothyroidism   . Anxiety   . Osteoarthritis of back     And neck  . Cervical neck pain with evidence of disc disease     with need for surgery -- November 2015    Medications:  Prescriptions prior to admission  Medication Sig Dispense Refill  . amiodarone (PACERONE) 200 MG tablet Take 1 tablet (200 mg total) by mouth daily as directed.  90 tablet  3  . aspirin 81 MG tablet Take  81 mg by mouth daily.      . dabigatran (PRADAXA) 75 MG CAPS capsule Take 1 capsule (75 mg total) by mouth 2 (two) times daily.  60 capsule  5  . furosemide (LASIX) 40 MG tablet Take 40 mg by mouth daily.      Marland Kitchen. HYDROcodone-acetaminophen (NORCO/VICODIN) 5-325 MG per tablet Take 0.5 tablets by mouth every 6 (six) hours as needed for moderate pain.       Marland Kitchen. levothyroxine (SYNTHROID, LEVOTHROID) 75 MCG tablet Take 75 mcg by mouth daily before breakfast.      . metoprolol (LOPRESSOR) 50 MG tablet Take 0.5 tablets (25 mg total) by mouth 2 (two) times daily.  60 tablet  6  . potassium chloride SA (K-DUR,KLOR-CON) 20 MEQ tablet Take 20 mEq by mouth daily.        Assessment: 73 yo who is s/p ACDF. She has been on Pradaxa outpt for her PAF. D/w Dr. Wynetta Emeryram and ok to dc meds post op for now and he will resume when appropriate.   Plan:    Hold Pradaxa post op Neuro will resume when appropriate  Ulyses SouthwardMinh Lawton Dollinger, PharmD Pager: [phone] 01/23/2014 2:31 PM

## 2014-01-23 NOTE — H&P (Signed)
Brenda LoganRobina B Glass is an 73 y.o. female.   Chief Complaint: Neck pain HPI: Patient is a 73 year old female with long-standing neck pain and difficulty using her hands has been going on for several years it has progressed recently workup has revealed severe cervical spinal stenosis and cord compression at C4-5 C5-6 and C6-7. And due to patient's failure of conservative treatment imaging findings and progression of clinical syndrome I recommended anterior cervical discectomies and fusion from C4-C7 I have extensively reviewed the risks and benefits of the operation the patient as well as perioperative course and expectations of outcome and alternatives surgery and she understood and agreed to proceed forward.   Past Medical History  Diagnosis Date  . S/P CABG x 2 2009    LIMA-LAD, SVG-RCA, with AV fistula ligation and Maze procedure  . CAD (coronary artery disease), native coronary artery 2009    75% LAD, diffuse 75-90% RCA --> Referred for CABG + MAZE;  Cardiolite 12/2013: No ischemia or Infarction  . Ischemic cardiomyopathy 2012    EF ~40-45% by Echo  . PAF (paroxysmal atrial fibrillation) 04/05/2012    s/p MAZE -- recurrence, cardioversion-converted to sinus bradycardia --> Now persistent despite being on Amiodarone & Pradaxa  . Essential hypertension   . Dyslipidemia, goal LDL below 70     On Crestor, followed by PCP  . COPD (chronic obstructive pulmonary disease)   . PAD (peripheral artery disease) 2007    s/p L Ileac A stent ; most recent Dopplers July 2012: Less than 50% reduction bilaterally. ABI 0.96 on the right 0.88 on left;;;12/27/2011   -ABI right .87 and left ABI .78  ,LEFT CIA and EIA stent normall patency, left CFA,SFA,and popliteal 0-49%; rgt proximal SFA 50-69%,rgt CIA,EIA, and CFA 0-49%  . Chronic low back pain     scoliosis & lordosis  . H/O: pneumonia   . Hypothyroidism   . Anxiety   . Osteoarthritis of back     And neck  . Cervical neck pain with evidence of disc disease      with need for surgery -- November 2015    Past Surgical History  Procedure Laterality Date  . Iliac artery stent Left 04/06/2005    Ex Iliac - CFA (Smart STENTS -- 7x4 in EIA, 6 x 3 CFA)   . Coronary artery bypass graft  2009    LIMA-LAD, SVG-RCA (also MAZE & AV fistula ligation)  . Maze  2009    along with CABG  . Cardioversion  04/05/2012    Procedure: CARDIOVERSION;  Surgeon: Brenda FairMihai Croitoru, MD;  [facility];  Service: Cardiovascular;  Laterality: N/A;  . Transthoracic echocardiogram  Jan 2012    EF ~40-45%, global HK; PAP ~45-50 mmHg  . Nm myoview ltd  May 2013    No ischemia or Infarct  . Abdominal hysterectomy    . Cardiac catheterization  07/30/2007    75% LAD ,3 stenoses of 75-90% in  RCA;circumflex from proximal RCA with no lesion seen,normal ramus intermediate branh; normal LV systoilc function  . Nm cardiolite ltd  12/2013    Non-gated for Afib; No Ischemia or Infarction.    Family History  Problem Relation Age of Onset  . Heart attack Mother   . Stroke Father   . Heart disease Brother 860   Social History:  reports that she quit smoking about 16 years ago. Her smoking use included Cigarettes. She smoked 0.50 packs per day. She does not have any smokeless tobacco history on file.  She reports that she does not drink alcohol or use illicit drugs.  Allergies: No Known Allergies  Medications Prior to Admission  Medication Sig Dispense Refill  . amiodarone (PACERONE) 200 MG tablet Take 1 tablet (200 mg total) by mouth daily as directed.  90 tablet  3  . aspirin 81 MG tablet Take 81 mg by mouth daily.      . dabigatran (PRADAXA) 75 MG CAPS capsule Take 1 capsule (75 mg total) by mouth 2 (two) times daily.  60 capsule  5  . furosemide (LASIX) 40 MG tablet Take 40 mg by mouth daily.      Marland Kitchen. HYDROcodone-acetaminophen (NORCO/VICODIN) 5-325 MG per tablet Take 0.5 tablets by mouth every 6 (six) hours as needed for moderate pain.       Marland Kitchen. levothyroxine (SYNTHROID,  LEVOTHROID) 75 MCG tablet Take 75 mcg by mouth daily before breakfast.      . metoprolol (LOPRESSOR) 50 MG tablet Take 0.5 tablets (25 mg total) by mouth 2 (two) times daily.  60 tablet  6  . potassium chloride SA (K-DUR,KLOR-CON) 20 MEQ tablet Take 20 mEq by mouth daily.        No results found for this or any previous visit (from the past 48 hour(s)). No results found.  Review of Systems  Constitutional: Negative.   Eyes: Negative.   Respiratory: Negative.   Cardiovascular: Negative.   Gastrointestinal: Negative.   Genitourinary: Negative.   Musculoskeletal: Positive for joint pain, myalgias and neck pain.  Skin: Negative.   Neurological: Positive for tingling and headaches.  Endo/Heme/Allergies: Negative.   Psychiatric/Behavioral: Negative.     Blood pressure 147/98, pulse 99, temperature 97.5 F (36.4 C), temperature source Oral, resp. rate 18, weight 44.906 kg (99 lb), SpO2 98.00%. Physical Exam  Constitutional: She is oriented to person, place, and time. She appears well-developed and well-nourished.  HENT:  Head: Normocephalic.  Eyes: Pupils are equal, round, and reactive to light.  Neck: Normal range of motion.  Cardiovascular: Normal rate.   Respiratory: Breath sounds normal.  GI: Soft. Bowel sounds are normal.  Neurological: She is alert and oriented to person, place, and time. GCS eye subscore is 4. GCS verbal subscore is 5. GCS motor subscore is 6.  Reflex Scores:      Tricep reflexes are 2+ on the right side and 2+ on the left side.      Bicep reflexes are 2+ on the right side and 2+ on the left side.      Brachioradialis reflexes are 2+ on the right side and 2+ on the left side.      Patellar reflexes are 2+ on the right side and 2+ on the left side.      Achilles reflexes are 2+ on the right side and 2+ on the left side. Strength is 5 out of 5 in her deltoids biceps triceps wrist flexion wrist extension and intrinsics  Skin: Skin is warm and dry.      Assessment/Plan [id number] year female presents for an ACDF at C4-5, C5-6, C6-7.  [name] P 01/23/2014, 7:16 AM

## 2014-01-24 ENCOUNTER — Encounter (HOSPITAL_COMMUNITY): Payer: Self-pay | Admitting: *Deleted

## 2014-01-24 NOTE — Progress Notes (Signed)
Patient DC'd home via car with family.  DC instructions and prescriptions given to patient.  Vital signs and assessments were stable.

## 2014-01-24 NOTE — Progress Notes (Signed)
JP Drain was found to have been disconnected this AM. The honeycomb dressing has old drainage but nothing new at this time. Will continue to monitor and have physician look at the site this morning. Amirra Herling, [place] ScrapeSarah E

## 2014-01-28 ENCOUNTER — Encounter (HOSPITAL_COMMUNITY): Payer: Self-pay | Admitting: Neurosurgery

## 2014-02-11 ENCOUNTER — Telehealth: Payer: Self-pay | Admitting: Cardiology

## 2014-02-11 NOTE — Telephone Encounter (Signed)
LMTCB

## 2014-02-11 NOTE — Telephone Encounter (Signed)
Deedee returned call. Communicated medication information. She voiced understanding

## 2014-02-11 NOTE — Telephone Encounter (Signed)
Please call,question about patient Crestor and Digitek.

## 2014-02-11 NOTE — Telephone Encounter (Signed)
Just stay off of Dig. Crestor should restart,but I was not sure why it was held. Maybe just start @ 1/2 dose Crestor.  DH

## 2014-02-11 NOTE — Telephone Encounter (Signed)
Spoke with Nicole Cellaorothy [facility]([place]) patient's daughter. She needed to know what the patient was supposed to do with crestor and digoxin - as [employer] had sent patient a letter asking why she had been off of these medications.   Patient saw Vernona RiegerLaura in Sept and was advised to hold Crestor x1 month and hold digoxin x4 days and resume at half previous dose (0.68325mcg)  Patient has been OFF both medications since Sept. Advised is OK to resume crestor - but will need to consult with Dr. Herbie BaltimoreHarding on digoxin, as this medication has been on hold x2 months, when was only supposed to be held x4 days  Notes Recorded by Kem ParkinsonKimalexis Barnes, RMA on 12/15/2013 at 3:59 PM This has been taken care of   Tobin ChadSharon V. Martin, RN at 12/11/2013 7:58 AM   Status: Signed      Late entry  Spoke to left message on Deedee (patient's daughter) voicemail with the information Stop Crestor for a month, and hold digoxin. Any question may call back   Notes Recorded by Kem ParkinsonKimalexis Barnes, RMA on 12/03/2013 at 4:24 PM Pt aware of results would like me to contact daughter to giver he the instructions as well, called daughter left message for her to call me back Notes Recorded by Kem ParkinsonKimalexis Barnes, RMA on 12/03/2013 at 11:19 AM Left message for patient to call me back Notes Recorded by Nada BoozerLaura Ingold, NP on 12/03/2013 at 11:16 AM Hold lanoxin for 4 days then begin 0.0625 mg daily.       Ref Range 34mo ago  6149yr ago     Digoxin Level 0.8 - 2.0 ng/mL 2.1 (H) 1.3

## 2014-02-20 ENCOUNTER — Other Ambulatory Visit: Payer: Self-pay | Admitting: Cardiology

## 2014-02-23 NOTE — Telephone Encounter (Signed)
Rx refill sent to patient pharmacy   

## 2014-04-02 ENCOUNTER — Telehealth: Payer: Self-pay | Admitting: Cardiology

## 2014-04-02 NOTE — Telephone Encounter (Signed)
[name] is calling because she is wanting someone to cover her mom medications with her. To make sure she is taking what she is supposed to , because her mom is getting mixed up with  the medications. Please Call   Thanks

## 2014-04-02 NOTE — Telephone Encounter (Signed)
Spoke w/ daughter. Ran through list of meds for patient. Pt has been correctly taking meds.   She is still taking Crestor 5mg  but this is not showing up on her current meds. She historically was taking it but looks like it was provider d/c'ed in Sept.   OK to represcribe or should I tell pt to d/c use?

## 2014-04-06 NOTE — Telephone Encounter (Signed)
Communicated Dr. Elissa Hefty advice w/ pt's daughter, voiced understanding.

## 2014-04-06 NOTE — Telephone Encounter (Signed)
If I am not mistaken - the statin was stopped to see if it was causing cramps. If she is tolerating it - I would like for her to continue it.  Marykay Lex, MD

## 2014-04-14 ENCOUNTER — Encounter: Payer: Self-pay | Admitting: Cardiology

## 2014-04-27 ENCOUNTER — Emergency Department (HOSPITAL_COMMUNITY): Payer: Medicare HMO

## 2014-04-27 ENCOUNTER — Emergency Department (HOSPITAL_COMMUNITY)
Admission: EM | Admit: 2014-04-27 | Discharge: 2014-04-27 | Disposition: A | Discharge status: Home / Self Care | Payer: Medicare HMO | Attending: Emergency Medicine | Admitting: Emergency Medicine

## 2014-04-27 ENCOUNTER — Encounter (HOSPITAL_COMMUNITY): Payer: Self-pay | Admitting: *Deleted

## 2014-04-27 DIAGNOSIS — I2510 Atherosclerotic heart disease of native coronary artery without angina pectoris: Secondary | ICD-10-CM | POA: Insufficient documentation

## 2014-04-27 DIAGNOSIS — N189 Chronic kidney disease, unspecified: Secondary | ICD-10-CM | POA: Insufficient documentation

## 2014-04-27 DIAGNOSIS — Z79899 Other long term (current) drug therapy: Secondary | ICD-10-CM | POA: Insufficient documentation

## 2014-04-27 DIAGNOSIS — R110 Nausea: Secondary | ICD-10-CM | POA: Insufficient documentation

## 2014-04-27 DIAGNOSIS — Z951 Presence of aortocoronary bypass graft: Secondary | ICD-10-CM | POA: Diagnosis not present

## 2014-04-27 DIAGNOSIS — M1990 Unspecified osteoarthritis, unspecified site: Secondary | ICD-10-CM | POA: Diagnosis not present

## 2014-04-27 DIAGNOSIS — M549 Dorsalgia, unspecified: Secondary | ICD-10-CM

## 2014-04-27 DIAGNOSIS — G8929 Other chronic pain: Secondary | ICD-10-CM | POA: Insufficient documentation

## 2014-04-27 DIAGNOSIS — Z9889 Other specified postprocedural states: Secondary | ICD-10-CM | POA: Insufficient documentation

## 2014-04-27 DIAGNOSIS — I482 Chronic atrial fibrillation, unspecified: Secondary | ICD-10-CM

## 2014-04-27 DIAGNOSIS — M545 Low back pain: Secondary | ICD-10-CM | POA: Insufficient documentation

## 2014-04-27 DIAGNOSIS — M546 Pain in thoracic spine: Secondary | ICD-10-CM | POA: Diagnosis not present

## 2014-04-27 DIAGNOSIS — Z87891 Personal history of nicotine dependence: Secondary | ICD-10-CM | POA: Insufficient documentation

## 2014-04-27 DIAGNOSIS — Z79891 Long term (current) use of opiate analgesic: Secondary | ICD-10-CM | POA: Insufficient documentation

## 2014-04-27 DIAGNOSIS — I129 Hypertensive chronic kidney disease with stage 1 through stage 4 chronic kidney disease, or unspecified chronic kidney disease: Secondary | ICD-10-CM | POA: Insufficient documentation

## 2014-04-27 DIAGNOSIS — E039 Hypothyroidism, unspecified: Secondary | ICD-10-CM | POA: Diagnosis not present

## 2014-04-27 DIAGNOSIS — N289 Disorder of kidney and ureter, unspecified: Secondary | ICD-10-CM

## 2014-04-27 DIAGNOSIS — Z8659 Personal history of other mental and behavioral disorders: Secondary | ICD-10-CM | POA: Diagnosis not present

## 2014-04-27 DIAGNOSIS — Z8701 Personal history of pneumonia (recurrent): Secondary | ICD-10-CM | POA: Insufficient documentation

## 2014-04-27 DIAGNOSIS — Z7982 Long term (current) use of aspirin: Secondary | ICD-10-CM | POA: Diagnosis not present

## 2014-04-27 DIAGNOSIS — J449 Chronic obstructive pulmonary disease, unspecified: Secondary | ICD-10-CM | POA: Diagnosis not present

## 2014-04-27 LAB — BASIC METABOLIC PANEL
Anion gap: 15 (ref 5–15)
BUN: 17 mg/dL (ref 6–23)
CALCIUM: 9.2 mg/dL (ref 8.4–10.5)
CO2: 25 mmol/L (ref 19–32)
CREATININE: 1.6 mg/dL — AB (ref 0.50–1.10)
Chloride: 98 mmol/L (ref 96–112)
GFR calc Af Amer: 36 mL/min — ABNORMAL LOW (ref >90)
GFR calc non Af Amer: 31 mL/min — ABNORMAL LOW (ref >90)
GLUCOSE: 172 mg/dL — AB (ref 70–99)
POTASSIUM: 4 mmol/L (ref 3.5–5.1)
Sodium: 138 mmol/L (ref 135–145)

## 2014-04-27 LAB — CBC WITH DIFFERENTIAL/PLATELET
BASOS PCT: 1 % (ref 0–1)
Basophils Absolute: 0.1 10*3/uL (ref 0.0–0.1)
Eosinophils Absolute: 0 10*3/uL (ref 0.0–0.7)
Eosinophils Relative: 0 % (ref 0–5)
HEMATOCRIT: 41.8 % (ref 36.0–46.0)
HEMOGLOBIN: 12.3 g/dL (ref 12.0–15.0)
LYMPHS ABS: 0.4 10*3/uL — AB (ref 0.7–4.0)
Lymphocytes Relative: 4 % — ABNORMAL LOW (ref 12–46)
MCH: 22.9 pg — AB (ref 26.0–34.0)
MCHC: 29.4 g/dL — AB (ref 30.0–36.0)
MCV: 77.8 fL — ABNORMAL LOW (ref 78.0–100.0)
Monocytes Absolute: 1.2 10*3/uL — ABNORMAL HIGH (ref 0.1–1.0)
Monocytes Relative: 11 % (ref 3–12)
Neutro Abs: 9.3 10*3/uL — ABNORMAL HIGH (ref 1.7–7.7)
Neutrophils Relative %: 84 % — ABNORMAL HIGH (ref 43–77)
Platelets: 459 10*3/uL — ABNORMAL HIGH (ref 150–400)
RBC: 5.37 MIL/uL — ABNORMAL HIGH (ref 3.87–5.11)
RDW: 19 % — AB (ref 11.5–15.5)
WBC: 11.1 10*3/uL — AB (ref 4.0–10.5)

## 2014-04-27 LAB — TROPONIN I

## 2014-04-27 MED ORDER — DILTIAZEM HCL 25 MG/5ML IV SOLN
10.0000 mg | Freq: Once | INTRAVENOUS | Status: AC
  Administered 2014-04-27: 10 mg via INTRAVENOUS

## 2014-04-27 MED ORDER — DEXTROSE 5 % IV SOLN
5.0000 mg/h | Freq: Once | INTRAVENOUS | Status: AC
  Administered 2014-04-27: 5 mg/h via INTRAVENOUS

## 2014-04-27 NOTE — Discharge Instructions (Signed)

## 2014-04-27 NOTE — ED Notes (Addendum)
Pt reports hx of back pain, having increase in pain/spasms to left side mid back pain. Denies recent cough or sob. Denies any new injury to back. Had n/v this am.  Ambulatory at triage.

## 2014-04-27 NOTE — ED Provider Notes (Signed)
CSN: [id number]     Arrival date & time 04/27/14  1043 History   First MD Initiated Contact with Patient 04/27/14 1153     Chief Complaint  Patient presents with  . Back Pain    Patient is a 74 y.o. female presenting with back pain. The history is provided by the patient and a relative.  Back Pain Location:  Thoracic spine Quality:  Aching Radiates to:  Does not radiate Pain severity:  Moderate Onset quality:  Gradual Duration:  1 day Timing:  Intermittent Progression:  Resolved Chronicity:  Recurrent Associated symptoms: no abdominal pain, no chest pain, no fever and no weakness   Patient reports she has had upper back pain for past day.  No trauma.  No falls.  No cp/sob.  No focal arm/leg weakness.  She reports she has had this before.  No h/o back surgery (but she does have h/o cervical spine surgery) she reports her back pain is resolved  She also reports palpitations/tachycardia today.  She reports h/o atrial fibrillation and is med compliant.  Past Medical History  Diagnosis Date  . S/P CABG x 2 2009    LIMA-LAD, SVG-RCA, with AV fistula ligation and Maze procedure  . CAD (coronary artery disease), native coronary artery 2009    75% LAD, diffuse 75-90% RCA --> Referred for CABG + MAZE;  Cardiolite 12/2013: No ischemia or Infarction  . Ischemic cardiomyopathy 2012    EF ~40-45% by Echo  . PAF (paroxysmal atrial fibrillation) 04/05/2012    s/p MAZE -- recurrence, cardioversion-converted to sinus bradycardia --> Now persistent despite being on Amiodarone & Pradaxa  . Essential hypertension   . Dyslipidemia, goal LDL below 70     On Crestor, followed by PCP  . COPD (chronic obstructive pulmonary disease)   . PAD (peripheral artery disease) 2007    s/p L Ileac A stent ; most recent Dopplers July 2012: Less than 50% reduction bilaterally. ABI 0.96 on the right 0.88 on left;;;12/27/2011   -ABI right .87 and left ABI .78  ,LEFT CIA and EIA stent normall patency, left CFA,SFA,and  popliteal 0-49%; rgt proximal SFA 50-69%,rgt CIA,EIA, and CFA 0-49%  . Chronic low back pain     scoliosis & lordosis  . H/O: pneumonia   . Hypothyroidism   . Anxiety   . Osteoarthritis of back     And neck  . Cervical neck pain with evidence of disc disease     with need for surgery -- November 2015   Past Surgical History  Procedure Laterality Date  . Iliac artery stent Left 04/06/2005    Ex Iliac - CFA (Smart STENTS -- 7x4 in EIA, 6 x 3 CFA)   . Coronary artery bypass graft  2009    LIMA-LAD, SVG-RCA (also MAZE & AV fistula ligation)  . Maze  2009    along with CABG  . Cardioversion  04/05/2012    Procedure: CARDIOVERSION;  Surgeon: Thurmon Fair, MD;  Location: [facility] ENDOSCOPY;  Service: Cardiovascular;  Laterality: N/A;  . Transthoracic echocardiogram  Jan 2012    EF ~40-45%, global HK; PAP ~45-50 mmHg  . Nm myoview ltd  May 2013    No ischemia or Infarct  . Abdominal hysterectomy    . Cardiac catheterization  07/30/2007    75% LAD ,3 stenoses of 75-90% in  RCA;circumflex from proximal RCA with no lesion seen,normal ramus intermediate branh; normal LV systoilc function  . Nm cardiolite ltd  12/2013    Non-gated for  Afib; No Ischemia or Infarction.  . Anterior cervical decomp/discectomy fusion N/A 01/23/2014    ProcedurMarland Kitchene: ANTERIOR CERVICAL DECOMPRESSION/DISCECTOMY FUSION CERVICAL FOUR-FIVE,CERVICAL FIVE-SIX,CERVICAL SIX-SEVEN;  Surgeon: Mariam DollarGary P Cram, MD;  Location: [facility] NEURO ORS;  Service: Neurosurgery;  Laterality: N/A;   Family History  Problem Relation Age of Onset  . Heart attack Mother   . Stroke Father   . Heart disease Brother 6160   History  Substance Use Topics  . Smoking status: Former Smoker -- 0.50 packs/day    Types: Cigarettes    Quit date: 01/07/1998  . Smokeless tobacco: Not on file  . Alcohol Use: No   OB History    No data available     Review of Systems  Constitutional: Negative for fever.  Respiratory: Negative for shortness of breath.    Cardiovascular: Negative for chest pain.  Gastrointestinal: Positive for nausea. Negative for abdominal pain.  Musculoskeletal: Positive for back pain.  Neurological: Negative for weakness.  All other systems reviewed and are negative.     Allergies  Review of patient's allergies indicates no known allergies.  Home Medications   Prior to Admission medications   Medication Sig Start Date End Date Taking? Authorizing Provider  amiodarone (PACERONE) 200 MG tablet Take 1 tablet (200 mg total) by mouth daily as directed. 08/21/13   Marykay Lexavid W Harding, MD  aspirin 81 MG tablet Take 81 mg by mouth daily.    Historical Provider, MD  furosemide (LASIX) 40 MG tablet Take 40 mg by mouth daily.    Historical Provider, MD  HYDROcodone-acetaminophen (NORCO/VICODIN) 5-325 MG per tablet Take 0.5 tablets by mouth every 6 (six) hours as needed for moderate pain.     Historical Provider, MD  levothyroxine (SYNTHROID, LEVOTHROID) 75 MCG tablet Take 75 mcg by mouth daily before breakfast.    Historical Provider, MD  metoprolol (LOPRESSOR) 50 MG tablet Take 0.5 tablets (25 mg total) by mouth 2 (two) times daily. 11/24/13   Marykay Lexavid W Harding, MD  oxyCODONE-acetaminophen (PERCOCET/ROXICET) 5-325 MG per tablet Take 1-2 tablets by mouth every 4 (four) hours as needed for moderate pain. 01/23/14   Mariam DollarGary P Cram, MD  potassium chloride SA (K-DUR,KLOR-CON) 20 MEQ tablet Take 20 mEq by mouth daily.    Historical Provider, MD  PRADAXA 75 MG CAPS capsule TAKE 1 CAPSULE (75 MG TOTAL) BY MOUTH 2 (TWO) TIMES DAILY. 02/23/14   Marykay Lexavid W Harding, MD  tiZANidine (ZANAFLEX) 2 MG tablet Take 1 tablet (2 mg total) by mouth once. 01/23/14   Mariam DollarGary P Cram, MD   BP 97/61 mmHg  Pulse 104  Temp(Src) 97.4 F (36.3 C) (Oral)  Resp 9  Ht 5' (1.524 m)  Wt 92 lb (41.731 kg)  BMI 17.97 kg/m2  SpO2 95% Physical Exam CONSTITUTIONAL: elderly, frail HEAD: Normocephalic/atraumatic EYES: EOMI ENMT: Mucous membranes moist NECK: supple no  meningeal signs SPINE/BACK:entire spine nontender, No bruising/crepitance/stepoffs noted to spine CV: tachycardic, irregular LUNGS: Lungs are clear to auscultation bilaterally, no apparent distress ABDOMEN: soft, nontender, no rebound or guarding, bowel sounds noted throughout abdomen GU:no cva tenderness NEURO: Pt is awake/alert/appropriate, moves all extremitiesx4.  No facial droop.   No arm drift.  She has equal power (5/5)in bilateral LE with hip flexion, knee flex/extension and ankle dorsi/plantar flexion.   EXTREMITIES: pulses normal/equal, full ROM SKIN: warm, color normal PSYCH: no abnormalities of mood noted, alert and oriented to situation  ED Course  Procedures   3:11 PM Pt with MSK back pain that had essentially resolved prior  to my evaluation.  She has no focal neuro deficits and is well appearing She had an episode of atrial fibrillation with RVR here in the ED. She already takes amiodarone/pradaxa After cardizem infusion her HR is improved She is in no distress -she denies CP/SOB She has no further back pain episodes I feel she is appropriate for d/c home and to continue her home meds Pt is agreeable with plan  Medications  diltiazem (CARDIZEM) injection 10 mg (0 mg Intravenous Stopped 04/27/14 1233)  diltiazem (CARDIZEM) 100 mg in dextrose 5 % 100 mL (1 mg/mL) infusion (0 mg/hr Intravenous Stopped 04/27/14 1339)    Labs Review Labs Reviewed  BASIC METABOLIC PANEL - Abnormal; Notable for the following:    Glucose, Bld 172 (*)    Creatinine, Ser 1.60 (*)    GFR calc non Af Amer 31 (*)    GFR calc Af Amer 36 (*)    All other components within normal limits  CBC WITH DIFFERENTIAL/PLATELET - Abnormal; Notable for the following:    WBC 11.1 (*)    RBC 5.37 (*)    MCV 77.8 (*)    MCH 22.9 (*)    MCHC 29.4 (*)    RDW 19.0 (*)    Platelets 459 (*)    Neutrophils Relative % 84 (*)    Neutro Abs 9.3 (*)    Lymphocytes Relative 4 (*)    Lymphs Abs 0.4 (*)     Monocytes Absolute 1.2 (*)    All other components within normal limits  TROPONIN I    Imaging Review Dg Chest Portable 1 View  04/27/2014   CLINICAL DATA:  Increasing back pain and spasms. No recent cough or shortness of breath. No recent injury. Initial encounter.  EXAM: PORTABLE CHEST - 1 VIEW  COMPARISON:  Radiographs 01/15/2014.  FINDINGS: Stable cardiomegaly status post median sternotomy and CABG. There is chronic aortic atherosclerosis. Vascular congestion has worsened without overt pulmonary edema. There is no confluent airspace opacity or significant pleural effusion. The bones appear unchanged status post lower cervical fusion. There is a thoracolumbar scoliosis. Multiple telemetry leads overlie the chest.  IMPRESSION: Cardiomegaly with increased vascular congestion. No acute findings demonstrated.   Electronically Signed   By: Roxy Horseman M.D.   On: 04/27/2014 12:37     EKG Interpretation   Date/Time:  Monday April 27 2014 13:10:25 EST Ventricular Rate:  105 PR Interval:    QRS Duration: 102 QT Interval:  400 QTC Calculation: 529 R Axis:   -81 Text Interpretation:  Atrial fibrillation Left anterior fascicular block  Probable anteroseptal infarct, old Borderline repolarization abnormality  Prolonged QT interval rate is improved when compared to prior Confirmed by  Bebe Shaggy  MD, Dorinda Hill ([id number]) on 04/27/2014 1:31:07 PM      MDM   Final diagnoses:  Back pain  Chronic atrial fibrillation  Left-sided thoracic back pain  Renal insufficiency    Nursing notes including past medical history and social history reviewed and considered in documentation xrays/imaging reviewed by myself and considered during evaluation Labs/vital reviewed myself and considered during evaluation     Joya Gaskins, MD 04/27/14 1514

## 2014-04-28 ENCOUNTER — Telehealth: Payer: Self-pay | Admitting: Cardiology

## 2014-04-28 NOTE — Telephone Encounter (Signed)
[name] called in stating that the pt was admitted to the hospital yesterday for back pain which in results increased her HR drastically. She is calling in today stating that  She having more back pain and would like to know if she needs to go back to the ED. Please call   Thanks

## 2014-04-28 NOTE — Telephone Encounter (Signed)
Spoke to daughter.  she states she received some Cardizem during visit. Daughter states pain medication helps with back pain but does not last long.  Heart rate is not increased at present time.,per daughter. RN informed daughter to contact Dr Wynetta Emeryram. He did surgery recently. Daughter states she has a call into his office as well.

## 2014-04-28 NOTE — Telephone Encounter (Signed)
Left message on machine to call back  

## 2014-05-02 ENCOUNTER — Emergency Department (HOSPITAL_COMMUNITY)
Admission: EM | Admit: 2014-05-02 | Discharge: 2014-05-02 | Disposition: A | Discharge status: Home / Self Care | Payer: Medicare HMO | Attending: Emergency Medicine | Admitting: Emergency Medicine

## 2014-05-02 ENCOUNTER — Emergency Department (HOSPITAL_COMMUNITY): Payer: Medicare HMO

## 2014-05-02 ENCOUNTER — Encounter (HOSPITAL_COMMUNITY): Payer: Self-pay | Admitting: Cardiology

## 2014-05-02 DIAGNOSIS — I4891 Unspecified atrial fibrillation: Secondary | ICD-10-CM | POA: Diagnosis not present

## 2014-05-02 DIAGNOSIS — M47819 Spondylosis without myelopathy or radiculopathy, site unspecified: Secondary | ICD-10-CM | POA: Diagnosis not present

## 2014-05-02 DIAGNOSIS — I739 Peripheral vascular disease, unspecified: Secondary | ICD-10-CM | POA: Insufficient documentation

## 2014-05-02 DIAGNOSIS — Z951 Presence of aortocoronary bypass graft: Secondary | ICD-10-CM | POA: Diagnosis not present

## 2014-05-02 DIAGNOSIS — I2510 Atherosclerotic heart disease of native coronary artery without angina pectoris: Secondary | ICD-10-CM | POA: Diagnosis not present

## 2014-05-02 DIAGNOSIS — G8929 Other chronic pain: Secondary | ICD-10-CM | POA: Diagnosis not present

## 2014-05-02 DIAGNOSIS — Z9889 Other specified postprocedural states: Secondary | ICD-10-CM | POA: Diagnosis not present

## 2014-05-02 DIAGNOSIS — I10 Essential (primary) hypertension: Secondary | ICD-10-CM | POA: Diagnosis not present

## 2014-05-02 DIAGNOSIS — F419 Anxiety disorder, unspecified: Secondary | ICD-10-CM | POA: Diagnosis not present

## 2014-05-02 DIAGNOSIS — Z7982 Long term (current) use of aspirin: Secondary | ICD-10-CM | POA: Diagnosis not present

## 2014-05-02 DIAGNOSIS — Z8701 Personal history of pneumonia (recurrent): Secondary | ICD-10-CM | POA: Diagnosis not present

## 2014-05-02 DIAGNOSIS — M546 Pain in thoracic spine: Secondary | ICD-10-CM

## 2014-05-02 DIAGNOSIS — E785 Hyperlipidemia, unspecified: Secondary | ICD-10-CM | POA: Diagnosis not present

## 2014-05-02 DIAGNOSIS — R0782 Intercostal pain: Secondary | ICD-10-CM | POA: Diagnosis present

## 2014-05-02 DIAGNOSIS — Z87891 Personal history of nicotine dependence: Secondary | ICD-10-CM | POA: Insufficient documentation

## 2014-05-02 DIAGNOSIS — Z79899 Other long term (current) drug therapy: Secondary | ICD-10-CM | POA: Insufficient documentation

## 2014-05-02 DIAGNOSIS — E039 Hypothyroidism, unspecified: Secondary | ICD-10-CM | POA: Insufficient documentation

## 2014-05-02 LAB — BASIC METABOLIC PANEL
Anion gap: 10 (ref 5–15)
BUN: 16 mg/dL (ref 6–23)
CO2: 32 mmol/L (ref 19–32)
CREATININE: 1.4 mg/dL — AB (ref 0.50–1.10)
Calcium: 8.8 mg/dL (ref 8.4–10.5)
Chloride: 95 mmol/L — ABNORMAL LOW (ref 96–112)
GFR, EST AFRICAN AMERICAN: 42 mL/min — AB (ref >90)
GFR, EST NON AFRICAN AMERICAN: 36 mL/min — AB (ref >90)
GLUCOSE: 148 mg/dL — AB (ref 70–99)
Potassium: 3.4 mmol/L — ABNORMAL LOW (ref 3.5–5.1)
SODIUM: 137 mmol/L (ref 135–145)

## 2014-05-02 LAB — I-STAT TROPONIN, ED: Troponin i, poc: 0 ng/mL (ref 0.00–0.08)

## 2014-05-02 LAB — CBC
HCT: 38.9 % (ref 36.0–46.0)
HEMOGLOBIN: 11.5 g/dL — AB (ref 12.0–15.0)
MCH: 22.3 pg — AB (ref 26.0–34.0)
MCHC: 29.6 g/dL — AB (ref 30.0–36.0)
MCV: 75.5 fL — ABNORMAL LOW (ref 78.0–100.0)
PLATELETS: 325 10*3/uL (ref 150–400)
RBC: 5.15 MIL/uL — ABNORMAL HIGH (ref 3.87–5.11)
RDW: 18.3 % — AB (ref 11.5–15.5)
WBC: 6.6 10*3/uL (ref 4.0–10.5)

## 2014-05-02 LAB — BRAIN NATRIURETIC PEPTIDE: B NATRIURETIC PEPTIDE 5: 344 pg/mL — AB (ref 0.0–100.0)

## 2014-05-02 MED ORDER — CYCLOBENZAPRINE HCL 10 MG PO TABS: 10.0000 mg | ORAL_TABLET | Freq: Two times a day (BID) | ORAL | Status: DC | PRN

## 2014-05-02 MED ORDER — DILTIAZEM HCL 25 MG/5ML IV SOLN
10.0000 mg | Freq: Once | INTRAVENOUS | Status: AC
  Administered 2014-05-02: 10 mg via INTRAVENOUS
  Filled 2014-05-02: qty 5

## 2014-05-02 MED ORDER — METOPROLOL TARTRATE 25 MG PO TABS
25.0000 mg | ORAL_TABLET | Freq: Once | ORAL | Status: AC
  Administered 2014-05-02: 25 mg via ORAL
  Filled 2014-05-02: qty 1

## 2014-05-02 NOTE — ED Notes (Signed)
Pt reports she has felt like her heart is racing starting earlier today. States she takes amiodarone from her cardiologist. Also reports back pain. Pt reports some SOB.

## 2014-05-02 NOTE — ED Notes (Signed)
Pt ambulated in hallway, no issues. HR elevated to 120. MD notified.

## 2014-05-02 NOTE — Discharge Instructions (Signed)
Atrial Fibrillation  Atrial fibrillation is a condition that causes your heart to beat irregularly. It may also cause your heart to beat faster than normal. Atrial fibrillation can prevent your heart from pumping blood normally. It increases your risk of stroke and heart problems.  HOME CARE  · Take medications as told by your doctor.  · Only take medications that your doctor says are safe. Some medications can make the condition worse or happen again.  · If blood thinners were prescribed by your doctor, take them exactly as told. Too much can cause bleeding. Too little and you will not have the needed protection against stroke and other problems.  · Perform blood tests at home if told by your doctor.  · Perform blood tests exactly as told by your doctor.  · Do not drink alcohol.  · Do not drink beverages with caffeine such as coffee, soda, and some teas.  · Maintain a healthy weight.  · Do not use diet pills unless your doctor says they are safe. They may make heart problems worse.  · Follow diet instructions as told by your doctor.  · Exercise regularly as told by your doctor.  · Keep all follow-up appointments.  GET HELP IF:  · You notice a change in the speed, rhythm, or strength of your heartbeat.  · You suddenly begin peeing (urinating) more often.  · You get tired more easily when moving or exercising.  GET HELP RIGHT AWAY IF:   · You have chest or belly (abdominal) pain.  · You feel sick to your stomach (nauseous).  · You are short of breath.  · You suddenly have swollen feet and ankles.  · You feel dizzy.  · You face, arms, or legs feel numb or weak.  · There is a change in your vision or speech.  MAKE SURE YOU:   · Understand these instructions.  · Will watch your condition.  · Will get help right away if you are not doing well or get worse.  Document Released: 12/28/2007 Document Revised: 08/04/2013 Document Reviewed: 04/30/2012  [employer]® Patient Information ©2015 [employer]. This information is not  intended to replace advice given to you by your health care provider. Make sure you discuss any questions you have with your health care provider.

## 2014-05-02 NOTE — ED Notes (Signed)
MD staets okay to give med with low BP

## 2014-05-02 NOTE — ED Provider Notes (Signed)
CSN: [id number]     Arrival date & time 05/02/14  1844 History   First MD Initiated Contact with Patient 05/02/14 1910     Chief Complaint  Patient presents with  . Chest Pain     (Consider location/radiation/quality/duration/timing/severity/associated sxs/prior Treatment) HPI  74 year old female with past history of atrial fibrillation and chronic back pain presents to ED complaining of heart fluttering which began today as well as persistent upper back pain which she has been having for many weeks now. Patient was recently seen in [facility] ED for atrial fibrillation and her back pain last week and subsequently discharged following rate control and an unremarkable workup. Patient states she is supposed to see her neurosurgeon regarding her back next week. She also states she is going to see her cardiologist next week as well.  Patient denies having any chest pain, shortness of breath, diaphoresis, nausea, vomiting, fevers, chills, cough or other symptoms currently. Patient does report having poor sleep because when the heart fluttering comes on he keeps her up all night. She states the last 2 nights this is happened. She also reports getting very anxious when this comes on. She states she is taking all her medications as instructed.   Past Medical History  Diagnosis Date  . S/P CABG x 2 2009    LIMA-LAD, SVG-RCA, with AV fistula ligation and Maze procedure  . CAD (coronary artery disease), native coronary artery 2009    75% LAD, diffuse 75-90% RCA --> Referred for CABG + MAZE;  Cardiolite 12/2013: No ischemia or Infarction  . Ischemic cardiomyopathy 2012    EF ~40-45% by Echo  . PAF (paroxysmal atrial fibrillation) 04/05/2012    s/p MAZE -- recurrence, cardioversion-converted to sinus bradycardia --> Now persistent despite being on Amiodarone & Pradaxa  . Essential hypertension   . Dyslipidemia, goal LDL below 70     On Crestor, followed by PCP  . COPD (chronic obstructive pulmonary disease)    . PAD (peripheral artery disease) 2007    s/p L Ileac A stent ; most recent Dopplers July 2012: Less than 50% reduction bilaterally. ABI 0.96 on the right 0.88 on left;;;12/27/2011   -ABI right .87 and left ABI .78  ,LEFT CIA and EIA stent normall patency, left CFA,SFA,and popliteal 0-49%; rgt proximal SFA 50-69%,rgt CIA,EIA, and CFA 0-49%  . Chronic low back pain     scoliosis & lordosis  . H/O: pneumonia   . Hypothyroidism   . Anxiety   . Osteoarthritis of back     And neck  . Cervical neck pain with evidence of disc disease     with need for surgery -- November 2015   Past Surgical History  Procedure Laterality Date  . Iliac artery stent Left 04/06/2005    Ex Iliac - CFA (Smart STENTS -- 7x4 in EIA, 6 x 3 CFA)   . Coronary artery bypass graft  2009    LIMA-LAD, SVG-RCA (also MAZE & AV fistula ligation)  . Maze  2009    along with CABG  . Cardioversion  04/05/2012    Procedure: CARDIOVERSION;  Surgeon: Thurmon Fair, MD;  Location: [facility] ENDOSCOPY;  Service: Cardiovascular;  Laterality: N/A;  . Transthoracic echocardiogram  Jan 2012    EF ~40-45%, global HK; PAP ~45-50 mmHg  . Nm myoview ltd  May 2013    No ischemia or Infarct  . Abdominal hysterectomy    . Cardiac catheterization  07/30/2007    75% LAD ,3 stenoses of 75-90% in  RCA;circumflex from proximal RCA with no lesion seen,normal ramus intermediate branh; normal LV systoilc function  . Nm cardiolite ltd  12/2013    Non-gated for Afib; No Ischemia or Infarction.  Marland Kitchen Anterior cervical decomp/discectomy fusion N/A 01/23/2014    Procedure: ANTERIOR CERVICAL DECOMPRESSION/DISCECTOMY FUSION CERVICAL FOUR-FIVE,CERVICAL FIVE-SIX,CERVICAL SIX-SEVEN;  Surgeon: Mariam Dollar, MD;  Location: [facility] NEURO ORS;  Service: Neurosurgery;  Laterality: N/A;   Family History  Problem Relation Age of Onset  . Heart attack Mother   . Stroke Father   . Heart disease Brother 25   History  Substance Use Topics  . Smoking status: Former Smoker -- 0.50  packs/day    Types: Cigarettes    Quit date: 01/07/1998  . Smokeless tobacco: Not on file  . Alcohol Use: No   OB History    No data available     Review of Systems  Constitutional: Negative for fever and chills.  HENT: Negative for congestion, rhinorrhea and sore throat.   Eyes: Negative for visual disturbance.  Respiratory: Positive for shortness of breath. Negative for cough.   Cardiovascular: Positive for palpitations. Negative for chest pain and leg swelling.  Gastrointestinal: Negative for nausea, vomiting, abdominal pain, diarrhea and constipation.  Genitourinary: Negative for dysuria, hematuria, vaginal bleeding and vaginal discharge.  Musculoskeletal: Positive for back pain. Negative for neck pain.  Skin: Negative for rash.  Neurological: Negative for weakness and headaches.  Psychiatric/Behavioral: The patient is nervous/anxious.   All other systems reviewed and are negative.     Allergies  Review of patient's allergies indicates no known allergies.  Home Medications   Prior to Admission medications   Medication Sig Start Date End Date Taking? Authorizing Provider  amiodarone (PACERONE) 200 MG tablet Take 1 tablet (200 mg total) by mouth daily as directed. 08/21/13   Marykay Lex, MD  aspirin 81 MG tablet Take 81 mg by mouth daily.    Historical Provider, MD  furosemide (LASIX) 40 MG tablet Take 40 mg by mouth daily.    Historical Provider, MD  HYDROcodone-acetaminophen (NORCO/VICODIN) 5-325 MG per tablet Take 0.5 tablets by mouth every 6 (six) hours as needed for moderate pain.     Historical Provider, MD  levothyroxine (SYNTHROID, LEVOTHROID) 75 MCG tablet Take 75 mcg by mouth daily before breakfast.    Historical Provider, MD  metoprolol (LOPRESSOR) 50 MG tablet Take 0.5 tablets (25 mg total) by mouth 2 (two) times daily. 11/24/13   Marykay Lex, MD  oxyCODONE-acetaminophen (PERCOCET/ROXICET) 5-325 MG per tablet Take 1-2 tablets by mouth every 4 (four) hours  as needed for moderate pain. 01/23/14   Mariam Dollar, MD  potassium chloride SA (K-DUR,KLOR-CON) 20 MEQ tablet Take 20 mEq by mouth daily.    Historical Provider, MD  PRADAXA 75 MG CAPS capsule TAKE 1 CAPSULE (75 MG TOTAL) BY MOUTH 2 (TWO) TIMES DAILY. 02/23/14   Marykay Lex, MD  tiZANidine (ZANAFLEX) 2 MG tablet Take 1 tablet (2 mg total) by mouth once. 01/23/14   Mariam Dollar, MD   BP 125/81 mmHg  Pulse 123  Temp(Src) 98.2 F (36.8 C) (Oral)  Resp 18  Wt 92 lb (41.731 kg)  SpO2 93% Physical Exam  Constitutional: She is oriented to person, place, and time. She appears well-developed and well-nourished. No distress.  HENT:  Head: Normocephalic and atraumatic.  Eyes: Conjunctivae are normal.  Neck: Normal range of motion.  Cardiovascular: Normal heart sounds and intact distal pulses.  An irregularly irregular rhythm present.  Tachycardia present.   No murmur heard. Pulmonary/Chest: Effort normal and breath sounds normal. No respiratory distress. She has no wheezes. She has no rales. She exhibits no tenderness.  Abdominal: Soft. Bowel sounds are normal. She exhibits no distension.  Musculoskeletal: Normal range of motion.       Cervical back: She exhibits normal range of motion, no tenderness and no bony tenderness.       Thoracic back: She exhibits normal range of motion, no tenderness and no bony tenderness.       Lumbar back: She exhibits normal range of motion, no tenderness and no bony tenderness.  Neurological: She is alert and oriented to person, place, and time. No cranial nerve deficit.  Skin: Skin is warm and dry.  Psychiatric: She has a normal mood and affect.  Nursing note and vitals reviewed.   ED Course  Procedures (including critical care time) Labs Review Labs Reviewed  CBC  BASIC METABOLIC PANEL  BRAIN NATRIURETIC PEPTIDE  I-STAT TROPOININ, ED    Imaging Review Dg Chest 2 View  05/02/2014   CLINICAL DATA:  Pt has upper left sided back pain between the  shoulder blades. Pt had surgery on back a few months ago due to pain but pain has not eased at all. Hx COPD, CABG x2  EXAM: CHEST  2 VIEW  COMPARISON:  04/27/2014  FINDINGS: Changes from CABG surgery are stable. Cardiac silhouette is mildly enlarged. No mediastinal or hilar masses or evidence of adenopathy.  Lungs are hyperexpanded. Mild diffuse thickening of the interstitial markings is noted. No convincing pulmonary edema. No lung consolidation to suggest pneumonia. No pleural effusion or pneumothorax.  Bony thorax is diffusely demineralized. Mild wedge-shaped compression fracture of a mid thoracic vertebrae is stable from a lateral chest radiograph from October 2015. Degenerative changes noted along the thoracic spine. Status post cervical spine anterior fusion.  IMPRESSION: No acute cardiopulmonary disease.   Electronically Signed   By: Amie Portland M.D.   On: 05/02/2014 19:41     EKG Interpretation None      MDM   Final diagnoses:  None    Patient presents to ED complaining of chronic upper back pain as well as heart fluttering.  Patient's back pain is not reproducible today and she is currently not having any back pain. It seems her pain is mainly at night and is also associated with palpitations, anxiety. Patient appears to be in atrial fibrillation with RVR. EKG otherwise unchanged from prior. Patient  Has not taken her evening dose of metoprolol today. Patient was given diltiazem and heart rate controlled well. Chest x-ray shows remote thoracic compression fracture which appears stable from October 2015. Upon ambulating patient she returned to RVR, stable BP, asx. Pt was given her home dose of metoprolol. HR controlled. Stable for discharge. Pt has f/u appts with both neuro and cards next week. Strict RP discussed and pt and family member voices understanding.  Clinical Impression: 1. Atrial fibrillation with RVR   2. Bilateral thoracic back pain     Disposition:  Discharge  Condition: Good  I have discussed the results, Dx and Tx plan with the pt(& family if present). He/she/they expressed understanding and agree(s) with the plan. Discharge instructions discussed at great length. Strict return precautions discussed and pt &/or family have verbalized understanding of the instructions. No further questions at time of discharge.    Discharge Medication List as of 05/02/2014 10:49 PM    START taking these medications   Details  cyclobenzaprine (FLEXERIL) 10 MG tablet Take 1 tablet (10 mg total) by mouth 2 (two) times daily as needed (back pain)., Starting 05/02/2014, Until Discontinued, Print        Follow Up: Herb Graysammy Spear, MD [address] [facility] [place] [id number] [phone]   As needed     Please follow up with your Cardiologist and Neurosurgeon next week as scheduled  [facility] EM[facility] [address] [id number] [place] [id number] [phone]  If symptoms worsen   Pt seen in conjunction with Dr. Pecolia AdesWickline  Glendy Barsanti, DO [facility] Resident - PGY-2      Ames DuraStephen Yahye Siebert, MD 05/03/14 [id number]  Joya Gaskinsonald W Wickline, MD 05/03/14 [phone]

## 2014-05-06 ENCOUNTER — Encounter: Payer: Self-pay | Admitting: Cardiology

## 2014-05-06 ENCOUNTER — Ambulatory Visit (INDEPENDENT_AMBULATORY_CARE_PROVIDER_SITE_OTHER): Payer: Medicare HMO | Admitting: Cardiology

## 2014-05-06 VITALS — BP 84/58 | HR 105 | Ht 60.0 in | Wt 88.1 lb

## 2014-05-06 DIAGNOSIS — I482 Chronic atrial fibrillation, unspecified: Secondary | ICD-10-CM

## 2014-05-06 DIAGNOSIS — Z79899 Other long term (current) drug therapy: Secondary | ICD-10-CM

## 2014-05-06 MED ORDER — FUROSEMIDE 20 MG PO TABS: 20.0000 mg | ORAL_TABLET | Freq: Every day | ORAL | Status: DC

## 2014-05-06 MED ORDER — DIGOXIN 125 MCG PO TABS: 0.0625 mg | ORAL_TABLET | Freq: Every day | ORAL | Status: DC

## 2014-05-06 NOTE — Patient Instructions (Addendum)
START Digoxin 0.0625 mg (1/2 Tablet) Daily.  DECREASE your Lasix to 20 mg daily  Your physician recommends that you return for lab work on Monday 05/11/14  Your physician recommends that you schedule a follow-up appointment on Wednesday 05/13/14 with DR.HARDING

## 2014-05-06 NOTE — Progress Notes (Signed)
05/06/2014 Brenda Loganobina B Qian   08/15/1940  [id number]  Primary Physician: Herb GraysSPEAR, TAMMY, MD Primary Cardiologist: Dr. Herbie BaltimoreHarding  Reason for visit/CC: Post ED follow-up for symptomatic atrial fibrillation  HPI:  [name] is a 74 y.o. 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 She is followed by Dr. Herbie BaltimoreHarding.   She presents to clinic today for post ED follow-up. [facility] for palpitations. W/U revealed atrial fibrillation w/ a RVR of 124 bpm. This was in the setting of reported medication compliance and avoidance of triggers including no caffeine or alcohol. This was w/o any symptoms or signs of recent infection/illness. Enzymes were negative. She had also had a recent negative NST. She was given metoprolol in the ED and ventricular rate improved as well as her symptoms. She was sent home and instructed to continue her amiodarone, metoprolol and Pradaxa. It was advised that she follow-up in our office for further evaluation.   Today in f/u, EKG demonstrates Afib with a rate of 105 bpm. She is hypotensive with a BP of 84/58. Despite this she feels ok except for constant fatigue. She denies dizziness, syncope/ near syncope. No dyspnea at rest but has mild DOE. No chest pain and no palpitations. She feels her afib is contributing to her fatigue. Review of her medications reveal that she is on low dose amiodarone, 200 mg daily, 25 mg of Lopressor BID and 40 mg of Lasix daily. She also takes Synthroid for hypothyroidism. She does note prior medication adjustments and states that she was at one point "over medicated" on Synthroid. She denies any abnormal bleeding on Pradaxa. Prior records also indicate that she was once on low dose digoxin. This was temporarily held 12/2013 due to an elevated digoxin level of 2.1. It appears that she was to hold  this for 4 days then resume back at a low dose of 0.0625 mg. However, the patient reports that she was not aware that she was suppose to restart this.    Current Outpatient Prescriptions  Medication Sig Dispense Refill  . amiodarone (PACERONE) 200 MG tablet Take 1 tablet (200 mg total) by mouth daily as directed. (Patient taking differently: Take 200 mg by mouth every evening. Take 1 tablet (200 mg total) by mouth daily as directed.) 90 tablet 3  . aspirin 81 MG tablet Take 81 mg by mouth daily.    . CRESTOR 5 MG tablet Take 5 mg by mouth every evening.     . cyclobenzaprine (FLEXERIL) 10 MG tablet Take 1 tablet (10 mg total) by mouth 2 (two) times daily as needed (back pain). 20 tablet 0  . furosemide (LASIX) 40 MG tablet Take 40 mg by mouth daily.    Marland Kitchen. HYDROcodone-acetaminophen (NORCO/VICODIN) 5-325 MG per tablet Take 0.5 tablets by mouth every 6 (six) hours as needed for moderate pain.     Marland Kitchen. levothyroxine (SYNTHROID, LEVOTHROID) 75 MCG tablet Take 75 mcg by mouth daily before breakfast.    . metoprolol (LOPRESSOR) 50 MG tablet Take 0.5 tablets (25 mg total) by mouth 2 (two) times daily. 60 tablet 6  . potassium chloride SA (K-DUR,KLOR-CON) 20 MEQ tablet Take 20 mEq by mouth daily.    Marland Kitchen. PRADAXA 75 MG CAPS capsule TAKE 1 CAPSULE (75 MG TOTAL) BY MOUTH 2 (TWO) TIMES DAILY. 60  capsule 5  . promethazine (PHENERGAN) 12.5 MG tablet Take 12.5 mg by mouth every 6 (six) hours as needed for nausea or vomiting.    Marland Kitchen tiZANidine (ZANAFLEX) 2 MG tablet Take 1 tablet (2 mg total) by mouth once. 60 tablet 0   No current facility-administered medications for this visit.    No Known Allergies  History   Social History  . Marital Status: Widowed    Spouse Name: N/A    Number of Children: N/A  . Years of Education: N/A   Occupational History  . Not on file.   Social History Main Topics  . Smoking status: Former Smoker -- 0.50 packs/day    Types: Cigarettes    Quit date: 01/07/1998  . Smokeless  tobacco: Not on file  . Alcohol Use: No  . Drug Use: No  . Sexual Activity: Not on file   Other Topics Concern  . Not on file   Social History Narrative    She is a divorced mother of 1 and one child who died. Her daughter is here with her   today. She has got 4 grandchildren. She is a native of [place] and only has an occasional alcoholic   beverage. She does not get routine exercise mostly because of her spine pain. She did previously smoke but quit many years ago.     Family History  Problem Relation Age of Onset  . Heart attack Mother   . Stroke Father   . Heart disease Brother 60    Review of Systems: General: negative for chills, fever, night sweats or weight changes.  Cardiovascular: negative for chest pain, dyspnea on exertion, edema, orthopnea, palpitations, paroxysmal nocturnal dyspnea or shortness of breath Dermatological: negative for rash Respiratory: negative for cough or wheezing Urologic: negative for hematuria Abdominal: negative for nausea, vomiting, diarrhea, bright red blood per rectum, melena, or hematemesis Neurologic: negative for visual changes, syncope, or dizziness All other systems reviewed and are otherwise negative except as noted above.    Blood pressure 84/58, pulse 105, height 5' (1.524 m), weight 88 lb 1.6 oz (39.962 kg).  General appearance: alert, cooperative and no distress Neck: no carotid bruit and no JVD Lungs: clear to auscultation bilaterally Heart: irregularly irregular rhythm and tachy rate Extremities: no LEE Pulses: 2+ and symmetric Skin: warm and dry Neurologic: Grossly normal  EKG Atrial Fibrillation. Resting HR 105.    ASSESSMENT AND PLAN:    1. Atrial Fibrillation: Resting rate is poorly controlled at 105 bpm. BP is low at 84/58. She is fairly asymptomatic at rest, but notes dyspnea with heavy exertion as well as fatigue. She needs better rate control, however we are limited by increasing her BB further due to  hypotension. Thus, I have elected to restart her on low dose digoxin, given that she also has LV dysfunction. I discussed this with our office pharmacist, Phillips Hay. She agrees that it would be ok to restart at a low dose. I reviewed recent labs obtained in the ED and K was stable. We will start at 0.0625 mg daily. Will recheck a digoxin level and BMP in 5 days. We will also check a TSH, to make sure she is not overmedicated with synthroid (pt reports history of this), as too much thyroid replacement can exacerbate atrial fibrillation. She is to continue 200 mg of Amiodarone. Will will also continue 25 mg of Lopressor BID for rate control, but will decrease her Lasix to 20 mg to allow for more BP room.  Continue Pradaxa for stroke prophylaxis.    2. Hypotension: BP is 84/58. Luckily she is tolerating this fairly well, without dizziness, lightheadedness, syncope/near-syncope or falls. Her antihypertensives include Metroprolol and Lasix. She needs to continue Metroprolol for rate control of her atrial fibrillation. She is on Lasix for CHF however she has been euvolemic without edema or weight gain. I discussed with our office pharmacist and we have decided to decrease her Lasix from 40 mg down to 20 mg so that her Metroprolol can be continued. Hopefully this will result in improvement in her blood pressure. She has been instructed to continue to monitor blood pressure closely at home as well as monitor her weight on a daily basis and assess for edema. She is to notify our office if she is to have any continued hypotension and/or fluid retention.  3. Chronic Systolic CHF: stable. Decrease lasix down to 20 mg daily due to hypotension. See above for details. Stressed low sodium diet and daily weights. Pt instructed to notify our office if weight gain of more than 3 lb in 24 hr period.    PLAN  restart low-dose digoxin. Decrease Lasix down to 20 mg. Obtain repeat digoxin level and BMP in 5 days. Patient  instructed to follow-up with Dr. Herbie Baltimore next week for reassessment.   SIMMONS, BRITTAINYPA-C 05/06/2014 2:14 PM `

## 2014-05-12 ENCOUNTER — Telehealth: Payer: Self-pay | Admitting: *Deleted

## 2014-05-12 ENCOUNTER — Encounter: Payer: Self-pay | Admitting: Cardiology

## 2014-05-12 NOTE — Telephone Encounter (Signed)
Spoke to patient. Result given . Verbalized understanding  

## 2014-05-12 NOTE — Telephone Encounter (Signed)
-----   Message from Marykay Lexavid W Harding, MD sent at 05/12/2014 10:31 AM EST ----- Labs look pretty good.  Mildly anemic, but close to normal.  HARDING, Piedad ClimesAVID W, M.D., M.S.

## 2014-05-13 ENCOUNTER — Ambulatory Visit (INDEPENDENT_AMBULATORY_CARE_PROVIDER_SITE_OTHER): Payer: Medicare HMO | Admitting: Cardiology

## 2014-05-13 ENCOUNTER — Encounter: Payer: Self-pay | Admitting: Cardiology

## 2014-05-13 VITALS — BP 130/70 | HR 104 | Ht 60.0 in | Wt 89.5 lb

## 2014-05-13 DIAGNOSIS — I739 Peripheral vascular disease, unspecified: Secondary | ICD-10-CM

## 2014-05-13 DIAGNOSIS — I482 Chronic atrial fibrillation, unspecified: Secondary | ICD-10-CM

## 2014-05-13 DIAGNOSIS — I10 Essential (primary) hypertension: Secondary | ICD-10-CM

## 2014-05-13 DIAGNOSIS — I2510 Atherosclerotic heart disease of native coronary artery without angina pectoris: Secondary | ICD-10-CM

## 2014-05-13 DIAGNOSIS — I255 Ischemic cardiomyopathy: Secondary | ICD-10-CM

## 2014-05-13 DIAGNOSIS — E785 Hyperlipidemia, unspecified: Secondary | ICD-10-CM

## 2014-05-13 NOTE — Patient Instructions (Addendum)
Talk to primary about anxiety medications   START  DIGOXIN  2 TABLETS  FOR ONE DAY, THEN 1 TABLET  FOR 2 DAYS, THEN 1/2 TABLET DAILY  Your physician recommends that you schedule a follow-up appointment in NEED EKG  IN 2 WEEKS  AND DIG LEVEL  Your physician wants you to follow-up in  3 MONTH DR HARDING- 30 MIN APPT.  You will receive a reminder letter in the mail two months in advance. If you don't receive a letter, please call our office to schedule the follow-up appointment.

## 2014-05-14 ENCOUNTER — Telehealth: Payer: Self-pay | Admitting: Cardiology

## 2014-05-14 ENCOUNTER — Other Ambulatory Visit: Payer: Self-pay | Admitting: *Deleted

## 2014-05-14 NOTE — Telephone Encounter (Signed)
Please call,confused about patient's medicine.

## 2014-05-14 NOTE — Telephone Encounter (Signed)
I have talked to this pt.s daughter and discussed the medication, daughter agreed with plan and is to let us know if pt has any increased confusion

## 2014-05-14 NOTE — Telephone Encounter (Signed)
Pt. Called no answer LMTCB 

## 2014-05-15 ENCOUNTER — Telehealth: Payer: Self-pay | Admitting: Cardiology

## 2014-05-15 NOTE — Telephone Encounter (Signed)
Pt says her [employer] directions are not right.She said we were supposed to have sent in a new one with new directions on 05-13-14.

## 2014-05-15 NOTE — Telephone Encounter (Signed)
Left message for pharmacy to call back. 

## 2014-05-19 NOTE — Telephone Encounter (Signed)
Spoke w/ pharmacy, instructions were acknowledged.

## 2014-05-19 NOTE — Assessment & Plan Note (Signed)
Her rate continues to be poorly-controlled today.  Had not started digoxin.  Plan: We'll digoxin load as follows:   START  DIGOXIN  2 TABLETS  FOR ONE DAY, THEN 1 TABLET  FOR 2 DAYS, THEN 1/2 TABLET DAILY  Check Dig level in ~ 1 week  Continue Amiodarone @ 200 mg daily & Metoprolol @ 25 mg bid --> IF BP stable (> 110 mmHg Systolic), can take additional 1/2 tab for HR > 100 if symptomatic  Anticoagulated with Pradaxa - no bleeding complications

## 2014-05-19 NOTE — Assessment & Plan Note (Signed)
On 5 mg Crestor - will need lab check after next ROV. Lab Results  Component Value Date   CHOL 142 12/02/2013   HDL 62.10 12/02/2013   LDLCALC 63 12/02/2013   TRIG 83.0 12/02/2013   CHOLHDL 2 12/02/2013

## 2014-05-19 NOTE — Progress Notes (Signed)
PCP: Herb Grays, MD  Clinic Note: Chief Complaint  Patient presents with  . Follow-up     reassessment over week and half, back pain , no chest pain, frequent heart racing, no sob , no edema, weight loss, hair thinning    HPI: [name] is a 74 y.o. female with a PMH (CAD-CABG, Ischemic CM - Class II ombined CHF, Chronic Afib with recurrence post MAZE, PAD)below who presents today for 2nd Pre-operative evaluation - following Myoview ST.  Seen by Robbie Lis, PA-C on 2/3 as a Post-ED evaluation for symptomatic Afib.   She went to the emergency room on January 31 for A. Fib RVR. Her rate was improved with beta blocker in the ER she was discharged home to followup in clinic. Last week she was noted to be hypotensive with blood pressure of 8454 but without any significant symptoms. She has had her neck surgery, but is sill limited by neck & back pain.  Past Medical History  Diagnosis Date  . S/P CABG x 2 2009    LIMA-LAD, SVG-RCA, with AV fistula ligation and Maze procedure  . CAD (coronary artery disease), native coronary artery 2009    75% LAD, diffuse 75-90% RCA --> Referred for CABG + MAZE;  Cardiolite 12/2013: No ischemia or Infarction  . Ischemic cardiomyopathy 2012    EF ~40-45% by Echo  . PAF (paroxysmal atrial fibrillation) 04/05/2012    s/p MAZE -- recurrence, cardioversion-converted to sinus bradycardia --> Now persistent despite being on Amiodarone & Pradaxa  . Essential hypertension   . Dyslipidemia, goal LDL below 70     On Crestor, followed by PCP  . COPD (chronic obstructive pulmonary disease)   . PAD (peripheral artery disease) 2007    s/p L Ileac A stent ; most recent Dopplers July 2012: Less than 50% reduction bilaterally. ABI 0.96 on the right 0.88 on left;;;12/27/2011   -ABI right .87 and left ABI .78  ,LEFT CIA and EIA stent normall patency, left CFA,SFA,and popliteal 0-49%; rgt proximal SFA 50-69%,rgt CIA,EIA, and CFA 0-49%  . Chronic low back pain    scoliosis & lordosis  . H/O: pneumonia   . Hypothyroidism   . Anxiety   . Osteoarthritis of back     And neck  . Cervical neck pain with evidence of disc disease     with need for surgery -- November 2015    Interval History: She presents today after having had  Digoxin added to her amiodarone and beta blocker. As far as I can tell she did not actually get the prescription filled. She still notes that her heart is going fast but is really mostly still bothered by her neck pain and anxiety. She has a really hard time getting to sleep and staying asleep.  With her heart going fast she does feel a bit dyspneic on exertion, but really can't do much exertion because of her neck. She denies any PND or orthopnea as well as minimal edema in the setting of having her Lasix dose reduced.  She deniesf near syncope or TIA/amaurosis fugax symptoms. No orthostatic symptoms. No claudication.  ROS: A comprehensive was performed. Review of Systems  Respiratory: Positive for shortness of breath. Negative for cough and wheezing.   Cardiovascular: Negative for claudication.  Gastrointestinal: Negative for heartburn, blood in stool and melena.  Genitourinary: Negative for hematuria.  Musculoskeletal: Positive for back pain and neck pain.  Neurological: Positive for dizziness. Negative for seizures and loss of consciousness.  Endo/Heme/Allergies: Does not bruise/bleed easily.       Hair loss  Psychiatric/Behavioral: Positive for depression. The patient is nervous/anxious.   All other systems reviewed and are negative.   Current Outpatient Prescriptions on File Prior to Visit  Medication Sig Dispense Refill  . amiodarone (PACERONE) 200 MG tablet Take 1 tablet (200 mg total) by mouth daily as directed. (Patient taking differently: Take 200 mg by mouth every evening. Take 1 tablet (200 mg total) by mouth daily as directed.) 90 tablet 3  . aspirin 81 MG tablet Take 81 mg by mouth daily.    . CRESTOR 5 MG  tablet Take 5 mg by mouth every evening.     . furosemide (LASIX) 20 MG tablet Take 1 tablet (20 mg total) by mouth daily. (Patient taking differently: Take 40 mg by mouth daily. ) 90 tablet 3  . HYDROcodone-acetaminophen (NORCO/VICODIN) 5-325 MG per tablet Take 0.5 tablets by mouth every 6 (six) hours as needed for moderate pain.     Marland Kitchen. levothyroxine (SYNTHROID, LEVOTHROID) 75 MCG tablet Take 75 mcg by mouth daily before breakfast.    . metoprolol (LOPRESSOR) 50 MG tablet Take 0.5 tablets (25 mg total) by mouth 2 (two) times daily. 60 tablet 6  . potassium chloride SA (K-DUR,KLOR-CON) 20 MEQ tablet Take 20 mEq by mouth daily.    Marland Kitchen. PRADAXA 75 MG CAPS capsule TAKE 1 CAPSULE (75 MG TOTAL) BY MOUTH 2 (TWO) TIMES DAILY. 60 capsule 5  . promethazine (PHENERGAN) 12.5 MG tablet Take 12.5 mg by mouth every 6 (six) hours as needed for nausea or vomiting.    . digoxin (LANOXIN) 0.125 MG tablet Take 0.5 tablets (0.0625 mg total) by mouth daily. (Patient not taking: Reported on 05/13/2014) 45 tablet 3  . tiZANidine (ZANAFLEX) 2 MG tablet Take 1 tablet (2 mg total) by mouth once. 60 tablet 0   No current facility-administered medications on file prior to visit.   No Known Allergies  SOCIAL AND FAMILY HISTORY REVIEWED IN EPIC -- no change  Wt Readings from Last 3 Encounters:  05/13/14 89 lb 8 oz (40.597 kg)  05/06/14 88 lb 1.6 oz (39.962 kg)  05/02/14 92 lb (41.731 kg)    PHYSICAL EXAM BP 130/70 mmHg  Pulse 104  Ht 5' (1.524 m)  Wt 89 lb 8 oz (40.597 kg)  BMI 17.48 kg/m2 General appearance: alert, cooperative, appears stated age, no distress and otherwise healthy-appearing. Neck: no adenopathy, no carotid bruit and no JVD Lungs: clear to auscultation bilaterally, normal percussion bilaterally and non-labored Heart: irregularly irregular rhythm & borderline tachycardic (HRon exam is less than EKG); S1 & S2 normal, no murmur, click, rub or gallop, nondisplaced PMI Abdomen: soft, non-tender; bowel  sounds normal; no masses,  no organomegaly Extremities: extremities normal, atraumatic, no cyanosis, or edema  Pulses: 2+ and symmetric;  Neurologic: Mental status: Alert, oriented, thought content appropriate Cranial nerves: normal (II-XII grossly intact)   Adult ECG Report  Rate: 104;  Rhythm: atrial fibrillation; R superior axis with IRBBB c/s Pulmonary Disease pattern.  CRO Septal MI, age undetermined --   Narrative Interpretation: No significant change.  Recent Labs:     Chemistry      Component Value Date/Time   NA 137 05/02/2014 1902   K 3.4* 05/02/2014 1902   CL 95* 05/02/2014 1902   CO2 32 05/02/2014 1902   BUN 16 05/02/2014 1902   CREATININE 1.40* 05/02/2014 1902      Component Value Date/Time   CALCIUM 8.8 05/02/2014  1902   ALKPHOS 56 12/02/2013 1223   AST 40* 12/02/2013 1223   ALT 35 12/02/2013 1223   BILITOT 1.2 12/02/2013 1223      ASSESSMENT / PLAN: Chronic atrial fibrillation Her rate continues to be poorly-controlled today.  Had not started digoxin.  Plan: We'll digoxin load as follows:   START  DIGOXIN  2 TABLETS  FOR ONE DAY, THEN 1 TABLET  FOR 2 DAYS, THEN 1/2 TABLET DAILY  Check Dig level in ~ 1 week  Continue Amiodarone @ 200 mg daily & Metoprolol @ 25 mg bid --> IF BP stable (> 110 mmHg Systolic), can take additional 1/2 tab for HR > 100 if symptomatic  Anticoagulated with Pradaxa - no bleeding complications    2 Vessel CAD - s/p CABG; LIMA-LAD, SVG-RCA No active angina Sx with relatively recent Negative Myoview.  Plan: Continuea ASA, BB, & statin   Ischemic cardiomyopathy Last EF evaluation was ~40-45% (probably less accurate with Afib) - no active HF Sx - NYHA Class II.  Currently taking 40 mg Lasix daily - can reduce if SBP <100.  She seems euvolemic. No BP room for ACE-I or ARB.    Hyperlipidemia with target LDL less than 70 On 5 mg Crestor - will need lab check after next ROV. Lab Results  Component Value Date   CHOL 142  12/02/2013   HDL 62.10 12/02/2013   LDLCALC 63 12/02/2013   TRIG 83.0 12/02/2013   CHOLHDL 2 12/02/2013      PAD (peripheral artery disease) Stable - no claudication Sx   Continue to encourage ambulation.  Atherosclerotic Arterial disease RF modifications as noted for CAD.     Orders Placed This Encounter  Procedures  . Digoxin level  . EKG 12-Lead   No orders of the defined types were placed in this encounter.     Followup: 3 months   Fariha Goto, Piedad Climes, M.D., M.S. Interventional Cardiologist   Pager # [phone]

## 2014-05-19 NOTE — Assessment & Plan Note (Signed)
Stable - no claudication Sx   Continue to encourage ambulation.  Atherosclerotic Arterial disease RF modifications as noted for CAD.

## 2014-05-19 NOTE — Assessment & Plan Note (Signed)
No active angina Sx with relatively recent Negative Myoview.  Plan: Continuea ASA, BB, & statin

## 2014-05-19 NOTE — Assessment & Plan Note (Signed)
Last EF evaluation was ~40-45% (probably less accurate with Afib) - no active HF Sx - NYHA Class II.  Currently taking 40 mg Lasix daily - can reduce if SBP <100.  She seems euvolemic. No BP room for ACE-I or ARB.

## 2014-06-04 ENCOUNTER — Other Ambulatory Visit: Payer: Self-pay | Admitting: Cardiology

## 2014-06-05 ENCOUNTER — Ambulatory Visit (INDEPENDENT_AMBULATORY_CARE_PROVIDER_SITE_OTHER): Payer: Medicare HMO | Admitting: *Deleted

## 2014-06-05 VITALS — HR 68 | Ht 60.0 in | Wt 85.8 lb

## 2014-06-05 DIAGNOSIS — I482 Chronic atrial fibrillation, unspecified: Secondary | ICD-10-CM

## 2014-06-05 DIAGNOSIS — E785 Hyperlipidemia, unspecified: Secondary | ICD-10-CM

## 2014-06-05 DIAGNOSIS — I2510 Atherosclerotic heart disease of native coronary artery without angina pectoris: Secondary | ICD-10-CM

## 2014-06-05 NOTE — Progress Notes (Signed)
EKG obtain.  Patient states she took morning medication today. She did not hold digoxin.She states she did not remember RN informed her she will have to come back next week to go to lab for digoxin lab.  RN placed information on AVS summary and informed her grandson Sharia ReeveJosh (who brought her here today)

## 2014-06-05 NOTE — Patient Instructions (Signed)
EKG WAS DONE TODAY  DR HARDING WILL REVIEW INFORMATION NEXT WEEK. WILL CONTACT YOU ONCE HE REVIEW YOUR EKG.  PLEASE GO TO LAB NEXT WEEK- FOR DIGOXIN LEVEL- DO NOT TAKE DIGOXIN (LANOXIN) THE MORNING OF THE LAB TEST UNTIL YOU HAVE LAB DONE.

## 2014-06-05 NOTE — Telephone Encounter (Signed)
Rx(s) sent to pharmacy electronically.  

## 2014-06-11 LAB — DIGOXIN LEVEL: Digoxin Level: 0.8 ng/mL (ref 0.8–2.0)

## 2014-06-12 ENCOUNTER — Other Ambulatory Visit: Payer: Self-pay | Admitting: Cardiology

## 2014-06-13 ENCOUNTER — Encounter (HOSPITAL_BASED_OUTPATIENT_CLINIC_OR_DEPARTMENT_OTHER): Payer: Self-pay

## 2014-06-13 ENCOUNTER — Emergency Department (HOSPITAL_BASED_OUTPATIENT_CLINIC_OR_DEPARTMENT_OTHER): Payer: Medicare HMO

## 2014-06-13 ENCOUNTER — Emergency Department (HOSPITAL_BASED_OUTPATIENT_CLINIC_OR_DEPARTMENT_OTHER)
Admission: EM | Admit: 2014-06-13 | Discharge: 2014-06-13 | Disposition: A | Discharge status: Home / Self Care | Payer: Medicare HMO | Attending: Emergency Medicine | Admitting: Emergency Medicine

## 2014-06-13 DIAGNOSIS — Z8701 Personal history of pneumonia (recurrent): Secondary | ICD-10-CM | POA: Diagnosis not present

## 2014-06-13 DIAGNOSIS — M546 Pain in thoracic spine: Secondary | ICD-10-CM | POA: Diagnosis not present

## 2014-06-13 DIAGNOSIS — Z87891 Personal history of nicotine dependence: Secondary | ICD-10-CM | POA: Diagnosis not present

## 2014-06-13 DIAGNOSIS — M1990 Unspecified osteoarthritis, unspecified site: Secondary | ICD-10-CM | POA: Diagnosis not present

## 2014-06-13 DIAGNOSIS — I10 Essential (primary) hypertension: Secondary | ICD-10-CM | POA: Diagnosis not present

## 2014-06-13 DIAGNOSIS — Z7982 Long term (current) use of aspirin: Secondary | ICD-10-CM | POA: Insufficient documentation

## 2014-06-13 DIAGNOSIS — Z79899 Other long term (current) drug therapy: Secondary | ICD-10-CM | POA: Insufficient documentation

## 2014-06-13 DIAGNOSIS — F419 Anxiety disorder, unspecified: Secondary | ICD-10-CM | POA: Diagnosis not present

## 2014-06-13 DIAGNOSIS — I2510 Atherosclerotic heart disease of native coronary artery without angina pectoris: Secondary | ICD-10-CM | POA: Insufficient documentation

## 2014-06-13 DIAGNOSIS — Z951 Presence of aortocoronary bypass graft: Secondary | ICD-10-CM | POA: Diagnosis not present

## 2014-06-13 DIAGNOSIS — E039 Hypothyroidism, unspecified: Secondary | ICD-10-CM | POA: Diagnosis not present

## 2014-06-13 DIAGNOSIS — I480 Paroxysmal atrial fibrillation: Secondary | ICD-10-CM | POA: Insufficient documentation

## 2014-06-13 DIAGNOSIS — R109 Unspecified abdominal pain: Secondary | ICD-10-CM | POA: Insufficient documentation

## 2014-06-13 DIAGNOSIS — Z9889 Other specified postprocedural states: Secondary | ICD-10-CM | POA: Diagnosis not present

## 2014-06-13 DIAGNOSIS — J449 Chronic obstructive pulmonary disease, unspecified: Secondary | ICD-10-CM | POA: Insufficient documentation

## 2014-06-13 DIAGNOSIS — G8929 Other chronic pain: Secondary | ICD-10-CM | POA: Insufficient documentation

## 2014-06-13 DIAGNOSIS — E785 Hyperlipidemia, unspecified: Secondary | ICD-10-CM | POA: Diagnosis not present

## 2014-06-13 LAB — COMPREHENSIVE METABOLIC PANEL
ALT: 37 U/L — ABNORMAL HIGH (ref 0–35)
AST: 45 U/L — ABNORMAL HIGH (ref 0–37)
Albumin: 4.4 g/dL (ref 3.5–5.2)
Alkaline Phosphatase: 69 U/L (ref 39–117)
Anion gap: 9 (ref 5–15)
BUN: 21 mg/dL (ref 6–23)
CO2: 33 mmol/L — ABNORMAL HIGH (ref 19–32)
Calcium: 9 mg/dL (ref 8.4–10.5)
Chloride: 96 mmol/L (ref 96–112)
Creatinine, Ser: 1.13 mg/dL — ABNORMAL HIGH (ref 0.50–1.10)
GFR calc Af Amer: 54 mL/min — ABNORMAL LOW (ref >90)
GFR calc non Af Amer: 47 mL/min — ABNORMAL LOW (ref >90)
Glucose, Bld: 109 mg/dL — ABNORMAL HIGH (ref 70–99)
Potassium: 4 mmol/L (ref 3.5–5.1)
Sodium: 138 mmol/L (ref 135–145)
Total Bilirubin: 0.9 mg/dL (ref 0.3–1.2)
Total Protein: 7.2 g/dL (ref 6.0–8.3)

## 2014-06-13 LAB — CBC WITH DIFFERENTIAL/PLATELET
Basophils Absolute: 0 10*3/uL (ref 0.0–0.1)
Basophils Relative: 0 % (ref 0–1)
Eosinophils Absolute: 0.1 10*3/uL (ref 0.0–0.7)
Eosinophils Relative: 1 % (ref 0–5)
HCT: 41.4 % (ref 36.0–46.0)
Hemoglobin: 12.7 g/dL (ref 12.0–15.0)
Lymphocytes Relative: 13 % (ref 12–46)
Lymphs Abs: 0.6 10*3/uL — ABNORMAL LOW (ref 0.7–4.0)
MCH: 23.9 pg — ABNORMAL LOW (ref 26.0–34.0)
MCHC: 30.7 g/dL (ref 30.0–36.0)
MCV: 77.8 fL — ABNORMAL LOW (ref 78.0–100.0)
Monocytes Absolute: 0.4 10*3/uL (ref 0.1–1.0)
Monocytes Relative: 7 % (ref 3–12)
Neutro Abs: 3.9 10*3/uL (ref 1.7–7.7)
Neutrophils Relative %: 79 % — ABNORMAL HIGH (ref 43–77)
Platelets: 171 10*3/uL (ref 150–400)
RBC: 5.32 MIL/uL — ABNORMAL HIGH (ref 3.87–5.11)
RDW: 21 % — ABNORMAL HIGH (ref 11.5–15.5)
WBC: 4.9 10*3/uL (ref 4.0–10.5)

## 2014-06-13 LAB — URINALYSIS, ROUTINE W REFLEX MICROSCOPIC
Glucose, UA: NEGATIVE mg/dL
Hgb urine dipstick: NEGATIVE
Ketones, ur: 15 mg/dL — AB
NITRITE: NEGATIVE
PROTEIN: NEGATIVE mg/dL
Specific Gravity, Urine: 1.022 (ref 1.005–1.030)
UROBILINOGEN UA: 1 mg/dL (ref 0.0–1.0)
pH: 5 (ref 5.0–8.0)

## 2014-06-13 LAB — URINE MICROSCOPIC-ADD ON

## 2014-06-13 LAB — I-STAT CG4 LACTIC ACID, ED: Lactic Acid, Venous: 0.92 mmol/L (ref 0.5–2.0)

## 2014-06-13 MED ORDER — FENTANYL CITRATE 0.05 MG/ML IJ SOLN
37.5000 ug | Freq: Once | INTRAMUSCULAR | Status: DC
  Filled 2014-06-13: qty 2

## 2014-06-13 MED ORDER — HYDROCODONE-ACETAMINOPHEN 5-325 MG PO TABS: 1.0000 | ORAL_TABLET | Freq: Four times a day (QID) | ORAL | Status: DC | PRN

## 2014-06-13 MED ORDER — SODIUM CHLORIDE 0.9 % IV BOLUS (SEPSIS)
500.0000 mL | Freq: Once | INTRAVENOUS | Status: AC
Start: 2014-06-13 — End: 2014-06-13
  Administered 2014-06-13: 500 mL via INTRAVENOUS

## 2014-06-13 MED ORDER — DOCUSATE SODIUM 100 MG PO CAPS: 100.0000 mg | ORAL_CAPSULE | Freq: Two times a day (BID) | ORAL | Status: DC

## 2014-06-13 MED ORDER — ONDANSETRON 8 MG PO TBDP: 8.0000 mg | ORAL_TABLET | Freq: Three times a day (TID) | ORAL | Status: DC | PRN

## 2014-06-13 NOTE — ED Notes (Signed)
Patient reports that she awoke with thoracic back pain and bilateral flank pain since awakening. Reports that it is worse with ambulation

## 2014-06-13 NOTE — Discharge Instructions (Signed)
°  We saw you in the ER for the back pain. All of our results are normal, including all labs and imaging. Kidney function is fine as well. We are not sure what is causing your abdominal pain, and recommend that you see your primary care doctor and the cardiologist  for further evaluation. If your symptoms get worse, return to the ER. Take the pain meds and nausea meds as prescribed.   Flank Pain Flank pain refers to pain that is located on the side of the body between the upper abdomen and the back. The pain may occur over a short period of time (acute) or may be long-term or reoccurring (chronic). It may be mild or severe. Flank pain can be caused by many things. CAUSES  Some of the more common causes of flank pain include:  Muscle strains.   Muscle spasms.   A disease of your spine (vertebral disk disease).   A lung infection (pneumonia).   Fluid around your lungs (pulmonary edema).   A kidney infection.   Kidney stones.   A very painful skin rash caused by the chickenpox virus (shingles).   Gallbladder disease.  HOME CARE INSTRUCTIONS  Home care will depend on the cause of your pain. In general,  Rest as directed by your caregiver.  Drink enough fluids to keep your urine clear or pale yellow.  Only take over-the-counter or prescription medicines as directed by your caregiver. Some medicines may help relieve the pain.  Tell your caregiver about any changes in your pain.  Follow up with your caregiver as directed. SEEK IMMEDIATE MEDICAL CARE IF:   Your pain is not controlled with medicine.   You have new or worsening symptoms.  Your pain increases.   You have abdominal pain.   You have shortness of breath.   You have persistent nausea or vomiting.   You have swelling in your abdomen.   You feel faint or pass out.   You have blood in your urine.  You have a fever or persistent symptoms for more than 2-3 days.  You have a fever and your  symptoms suddenly get worse. MAKE SURE YOU:   Understand these instructions.  Will watch your condition.  Will get help right away if you are not doing well or get worse. Document Released: 05/11/2005 Document Revised: 12/13/2011 Document Reviewed: 11/02/2011 [facility] Patient Information 2015 [employer] [place]. This information is not intended to replace advice given to you by your health care provider. Make sure you discuss any questions you have with your health care provider.

## 2014-06-13 NOTE — ED Provider Notes (Signed)
CSN: [id number]     Arrival date & time 06/13/14  1043 History   First MD Initiated Contact with Patient 06/13/14 1216     Chief Complaint  Patient presents with  . Flank Pain     (Consider location/radiation/quality/duration/timing/severity/associated sxs/prior Treatment) HPI Comments: 74 y.o. female with a PMH that includes CAD s/p CABG, CHF w/ EF of 40-45% on echo in 2012, Chronic Afib with on pradaxa, h/o PAD and hypothyroidism. Pt comes in with back pain. Pt woke up at 3 am, with bilateral flank pain and thoracic spine back. She has hx of thoracic spine pain, and it is not unusual at all and not severe - however, the flank pain is new. Pt went to sleep again - only to wake up with the same pain in the morning, and the -pain over time hasnt gotten better. Pt has no uti like sx, she has constipation- with last BM being 2 days ago and she has no numbness, tingling, weakness, dizziness. No hx of similar pain.   Patient is a 74 y.o. female presenting with flank pain. The history is provided by the patient.  Flank Pain Associated symptoms include abdominal pain. Pertinent negatives include no chest pain, no headaches and no shortness of breath.    Past Medical History  Diagnosis Date  . S/P CABG x 2 2009    LIMA-LAD, SVG-RCA, with AV fistula ligation and Maze procedure  . CAD (coronary artery disease), native coronary artery 2009    75% LAD, diffuse 75-90% RCA --> Referred for CABG + MAZE;  Cardiolite 12/2013: No ischemia or Infarction  . Ischemic cardiomyopathy 2012    EF ~40-45% by Echo  . PAF (paroxysmal atrial fibrillation) 04/05/2012    s/p MAZE -- recurrence, cardioversion-converted to sinus bradycardia --> Now persistent despite being on Amiodarone & Pradaxa  . Essential hypertension   . Dyslipidemia, goal LDL below 70     On Crestor, followed by PCP  . COPD (chronic obstructive pulmonary disease)   . PAD (peripheral artery disease) 2007    s/p L Ileac A stent ; most recent  Dopplers July 2012: Less than 50% reduction bilaterally. ABI 0.96 on the right 0.88 on left;;;12/27/2011   -ABI right .87 and left ABI .78  ,LEFT CIA and EIA stent normall patency, left CFA,SFA,and popliteal 0-49%; rgt proximal SFA 50-69%,rgt CIA,EIA, and CFA 0-49%  . Chronic low back pain     scoliosis & lordosis  . H/O: pneumonia   . Hypothyroidism   . Anxiety   . Osteoarthritis of back     And neck  . Cervical neck pain with evidence of disc disease     with need for surgery -- November 2015   Past Surgical History  Procedure Laterality Date  . Iliac artery stent Left 04/06/2005    Ex Iliac - CFA (Smart STENTS -- 7x4 in EIA, 6 x 3 CFA)   . Coronary artery bypass graft  2009    LIMA-LAD, SVG-RCA (also MAZE & AV fistula ligation)  . Maze  2009    along with CABG  . Cardioversion  04/05/2012    Procedure: CARDIOVERSION;  Surgeon: Thurmon FairMihai Croitoru, MD;  [facility];  Service: Cardiovascular;  Laterality: N/A;  . Transthoracic echocardiogram  Jan 2012    EF ~40-45%, global HK; PAP ~45-50 mmHg  . Nm myoview ltd  May 2013    No ischemia or Infarct  . Abdominal hysterectomy    . Cardiac catheterization  07/30/2007  75% LAD ,3 stenoses of 75-90% in  RCA;circumflex from proximal RCA with no lesion seen,normal ramus intermediate branh; normal LV systoilc function  . Nm cardiolite ltd  12/2013    Non-gated for Afib; No Ischemia or Infarction.  Marland Kitchen Anterior cervical decomp/discectomy fusion N/A 01/23/2014    Procedure: ANTERIOR CERVICAL DECOMPRESSION/DISCECTOMY FUSION CERVICAL FOUR-FIVE,CERVICAL FIVE-SIX,CERVICAL SIX-SEVEN;  Surgeon: Mariam Dollar, MD;  Location: [facility];  Service: Neurosurgery;  Laterality: N/A;   Family History  Problem Relation Age of Onset  . Heart attack Mother   . Stroke Father   . Heart disease Brother 3   History  Substance Use Topics  . Smoking status: Former Smoker -- 0.50 packs/day    Types: Cigarettes    Quit date: 01/07/1998  . Smokeless tobacco:  Not on file  . Alcohol Use: No   OB History    No data available     Review of Systems  Constitutional: Negative for activity change.  Respiratory: Negative for shortness of breath.   Cardiovascular: Negative for chest pain.  Gastrointestinal: Positive for abdominal pain. Negative for nausea and vomiting.  Genitourinary: Positive for flank pain. Negative for dysuria and hematuria.  Musculoskeletal: Positive for back pain. Negative for neck pain.  Neurological: Negative for headaches.  All other systems reviewed and are negative.     Allergies  Review of patient's allergies indicates no known allergies.  Home Medications   Prior to Admission medications   Medication Sig Start Date End Date Taking? Authorizing Provider  amiodarone (PACERONE) 200 MG tablet TAKE 1 TABLET (200 MG TOTAL) BY MOUTH DAILY AS DIRECTED. 06/05/14   Marykay Lex, MD  aspirin 81 MG tablet Take 81 mg by mouth daily.    Historical Provider, MD  CRESTOR 5 MG tablet Take 5 mg by mouth every evening.  04/13/14   Historical Provider, MD  Digoxin (LANOXIN) 62.5 MCG TABS Take 31.25 mcg by mouth daily. Take 1/2 tablet daily=31.41mcg 06/12/14   Marykay Lex, MD  docusate sodium (COLACE) 100 MG capsule Take 1 capsule (100 mg total) by mouth every 12 (twelve) hours. 06/13/14   Derwood Kaplan, MD  furosemide (LASIX) 20 MG tablet Take 1 tablet (20 mg total) by mouth daily. Patient taking differently: Take 40 mg by mouth daily.  05/06/14   Brittainy Sherlynn Carbon, PA-C  HYDROcodone-acetaminophen (NORCO/VICODIN) 5-325 MG per tablet Take 1 tablet by mouth every 6 (six) hours as needed. 06/13/14   Derwood Kaplan, MD  levothyroxine (SYNTHROID, LEVOTHROID) 75 MCG tablet Take 75 mcg by mouth daily before breakfast.    Historical Provider, MD  metoprolol (LOPRESSOR) 50 MG tablet Take 0.5 tablets (25 mg total) by mouth 2 (two) times daily. 11/24/13   Marykay Lex, MD  ondansetron (ZOFRAN ODT) 8 MG disintegrating tablet Take 1 tablet (8  mg total) by mouth every 8 (eight) hours as needed for nausea. 06/13/14   Derwood Kaplan, MD  potassium chloride SA (K-DUR,KLOR-CON) 20 MEQ tablet Take 20 mEq by mouth daily.    Historical Provider, MD  PRADAXA 75 MG CAPS capsule TAKE 1 CAPSULE (75 MG TOTAL) BY MOUTH 2 (TWO) TIMES DAILY. 02/23/14   Marykay Lex, MD  promethazine (PHENERGAN) 12.5 MG tablet Take 12.5 mg by mouth every 6 (six) hours as needed for nausea or vomiting.    Historical Provider, MD  tiZANidine (ZANAFLEX) 2 MG tablet Take 1 tablet (2 mg total) by mouth once. 01/23/14   Donalee Citrin, MD   BP 108/73 mmHg  Pulse 79  Temp(Src) 98 F (36.7 C) (Oral)  Resp 18  Wt 88 lb (39.917 kg)  SpO2 97% Physical Exam  Constitutional: She is oriented to person, place, and time. She appears well-developed and well-nourished.  HENT:  Head: Normocephalic and atraumatic.  Eyes: EOM are normal. Pupils are equal, round, and reactive to light.  Neck: Neck supple.  Cardiovascular: Normal rate, regular rhythm, normal heart sounds and intact distal pulses.   Bilateral femoral, popliteal and DP pulses are equal.  Pulmonary/Chest: Effort normal. No respiratory distress.  Abdominal: Soft. She exhibits no distension. There is no tenderness. There is no rebound and no guarding.  Bilateral flank pain  Neurological: She is alert and oriented to person, place, and time.  Skin: Skin is warm and dry.  Nursing note and vitals reviewed.   ED Course  Procedures (including critical care time) Labs Review Labs Reviewed  URINALYSIS, ROUTINE W REFLEX MICROSCOPIC - Abnormal; Notable for the following:    Color, Urine AMBER (*)    Bilirubin Urine SMALL (*)    Ketones, ur 15 (*)    Leukocytes, UA SMALL (*)    All other components within normal limits  CBC WITH DIFFERENTIAL/PLATELET - Abnormal; Notable for the following:    RBC 5.32 (*)    MCV 77.8 (*)    MCH 23.9 (*)    RDW 21.0 (*)    Neutrophils Relative % 79 (*)    Lymphs Abs 0.6 (*)    All  other components within normal limits  COMPREHENSIVE METABOLIC PANEL - Abnormal; Notable for the following:    CO2 33 (*)    Glucose, Bld 109 (*)    Creatinine, Ser 1.13 (*)    AST 45 (*)    ALT 37 (*)    GFR calc non Af Amer 47 (*)    GFR calc Af Amer 54 (*)    All other components within normal limits  URINE MICROSCOPIC-ADD ON  I-STAT CG4 LACTIC ACID, ED    Imaging Review Ct Renal Stone Study  06/13/2014   CLINICAL DATA:  Bilateral flank pain.  Chronic low back pain.  EXAM: CT ABDOMEN AND PELVIS WITHOUT CONTRAST  TECHNIQUE: Multidetector CT imaging of the abdomen and pelvis was performed following the standard protocol without IV contrast.  COMPARISON:  CT abdomen pelvis - 07/27/2007; 03/21/2005  FINDINGS: The lack of intravenous contrast limits the ability to evaluate solid abdominal organs.  Normal hepatic contour. There is diffuse increased attenuation of the hepatic parenchyma. There is layering high attenuating debris seen within the gallbladder (image 26, series 2 curvature River lead sludge and/or a layering gallstones. No ascites.  Note is made of an approximately 1.4 x 1.1 cm hypo attenuating lesion arising from the superior aspect of the left kidney (image 23, series 2), incompletely characterized on this noncontrast examination though favored to represent a renal cyst. Otherwise, normal noncontrast appearance of the bilateral kidneys. No renal stones. No renal stones are seen along the expected course of either ureter or the urinary bladder. Normal noncontrast appearance of the urinary bladder given degree distention.  Normal noncontrast appearance of the bilateral adrenal glands, pancreas and spleen.  Large colonic stool burden. Normal appearance of the retrocecal appendix. No pneumoperitoneum, pneumatosis or portal venous gas.  There is apparent diffuse wall thickening involving the stomach (representative images 13 and 18, series 2), duodenum (image 33, series 2 coronal image 26,  series 5, extending to involve the proximal jejunum (image 46, series 2, coronal image  51, series 5). Feculent material is seen throughout near the entirety of the small bowel.  No bulky retroperitoneal, mesenteric, pelvic or inguinal lymphadenopathy on this noncontrast examination.  Post hysterectomy. No discrete adnexal lesion. No free fluid in the pelvic cul-de-sac.  Extensive calcified atherosclerotic plaque throughout a normal caliber abdominal aorta. There is extensive atherosclerotic plaque throughout the bilateral common and external iliac arteries. The patient has undergone overlapping stent placement involving the left common and external iliac arteries, the patency of which is not depicted on this noncontrast examination.  There is a suspected severe (greater than 70%) luminal narrowing involving origin of the left common iliac artery (coronal image 32, series 5) as well as a long segment severe narrowing involving the right external iliac artery (axial image 44, series 2, coronal image 31, series 5) on this noncontrast examination, suspected given marked calcified atherosclerotic plaque  Limited visualization the lower thorax is minimally degraded secondary to patient respiratory artifact. No discrete focal airspace opacities or pleural effusion. Cardiomegaly. Coronary artery calcifications.  No acute or aggressive osseus abnormalities. Mild rotatory scoliotic curvature of the thoracolumbar spine, convex to the left with moderate DDD at the apex on the concavity centered about the right side of the L3-L4 intervertebral disc space.  Regional soft tissues appear normal.  IMPRESSION: 1. Apparent diffuse wall thickening involving the stomach and duodenum extending to the level of the proximal jejunum, the etiology of which is not depicted on this examination. Further evaluation could be performed with endoscopy as clinically indicated. 2. There is feculent debris seen throughout the small bowel to the level  of the colon where a large colonic stool burden is seen. No gaseous distention of bowel to suggest obstruction the constipation is suspected. 3. Otherwise, no explanation for patient's bilateral flank pain. Specifically, no evidence of nephrolithiasis urinary obstruction. 4. Radiopaque material lies dependently within the gallbladder favored to represent radiopaque gallbladder sludge/stones. 5. Increased attenuation of the hepatic parenchyma, nonspecific though could be seen in the setting of chronic amiodarone administration. 6. Extensive calcified atherosclerotic plaque within a normal caliber abdominal aorta. 7. Suspected hemodynamically significant narrowings involving the left common and right external iliac arteries given amount of extensive calcified atherosclerotic plaque affecting these vessels at these locations. 8. Post overlapping stent placement of the left common and external iliac arteries, incompletely evaluated on this noncontrast examination. 9. Approximate 1.4 cm hypoattenuating left-sided renal lesion, incompletely characterized on this noncontrast examination though potentially representative of a renal cyst. Further evaluation could be performed with nonemergent renal ultrasound as clinically indicated. 10. Cardiomegaly.  Coronary artery calcifications.   Electronically Signed   By: Simonne Come M.D.   On: 06/13/2014 14:29     EKG Interpretation None      MDM   Final diagnoses:  Bilateral flank pain    Pt comes in with cc of back pain - bilateral flank. She is on pradaxa, and has vascular pathology. No anterior abd pain - lactate is normal, and we dont think there is ischemic bowel. Pt has normal pulse exam and normal neuro exam. CT done to ensure there was no AAA and to look at the kidneys. We didn't get iv contrast given CKD, and the exam being underwhelming for an acute vascular process. Ct results as above.  There is non specific edema of the stomach and small intestine  and also some iliac artery bilaterally with stenosis. Pt has no claudication. Repeat pulse exam normal. Pt has stents in the lower extremities - so  the results of both the stenosis and the inflammation around the stomach and intestine discussed, but they are unlikely to be the cause of her current pain.  Pt will see Dr. Herbie Baltimore soon. She will return to the ER if her symptoms get worse. There is mild constipation as well - and she will take some stool softeners. Pt and daughter happy with the plan.  Derwood Kaplan, MD 06/13/14 [id number]

## 2014-06-13 NOTE — ED Notes (Signed)
rx x 3 for colace, zofran, and norco- d/c home with family to drive- denies pain- steady gait

## 2014-06-15 ENCOUNTER — Telehealth: Payer: Self-pay | Admitting: *Deleted

## 2014-06-15 NOTE — Telephone Encounter (Signed)
-----   Message from Marykay Lexavid W Harding, MD sent at 06/15/2014  2:14 PM EDT ----- Dig level is much better.  Stable/therapeutic.  Continue current Rx.  DH

## 2014-06-15 NOTE — Telephone Encounter (Signed)
Spoke to patient. Result given. Verbalized understanding and states she will let her daughter know as well.

## 2014-07-04 ENCOUNTER — Emergency Department (HOSPITAL_COMMUNITY)
Admission: EM | Admit: 2014-07-04 | Discharge: 2014-07-04 | Disposition: A | Discharge status: Home / Self Care | Payer: Medicare HMO | Source: Home / Self Care | Attending: Family Medicine | Admitting: Family Medicine

## 2014-07-04 ENCOUNTER — Emergency Department (INDEPENDENT_AMBULATORY_CARE_PROVIDER_SITE_OTHER): Payer: Medicare HMO

## 2014-07-04 ENCOUNTER — Encounter (HOSPITAL_COMMUNITY): Payer: Self-pay | Admitting: Emergency Medicine

## 2014-07-04 DIAGNOSIS — M545 Low back pain, unspecified: Secondary | ICD-10-CM

## 2014-07-04 DIAGNOSIS — W19XXXA Unspecified fall, initial encounter: Secondary | ICD-10-CM

## 2014-07-04 MED ORDER — HYDROCODONE-ACETAMINOPHEN 7.5-325 MG PO TABS: 1.0000 | ORAL_TABLET | Freq: Three times a day (TID) | ORAL | Status: DC | PRN

## 2014-07-04 NOTE — ED Provider Notes (Signed)
[name] is a 74 y.o. female who presents to Urgent Care today for low back pain. Patient has chronic rash sick and lumbar pain secondary to degenerative disc disease and scoliosis. She was in her normal state of health when she fell backwards this week and hit her back on a wooden stool. She notes continuous thoracic and low back pain since the injury. She denies any radiating pain weakness or numbness.   Past Medical History  Diagnosis Date  . S/P CABG x 2 2009    LIMA-LAD, SVG-RCA, with AV fistula ligation and Maze procedure  . CAD (coronary artery disease), native coronary artery 2009    75% LAD, diffuse 75-90% RCA --> Referred for CABG + MAZE;  Cardiolite 12/2013: No ischemia or Infarction  . Ischemic cardiomyopathy 2012    EF ~40-45% by Echo  . PAF (paroxysmal atrial fibrillation) 04/05/2012    s/p MAZE -- recurrence, cardioversion-converted to sinus bradycardia --> Now persistent despite being on Amiodarone & Pradaxa  . Essential hypertension   . Dyslipidemia, goal LDL below 70     On Crestor, followed by PCP  . COPD (chronic obstructive pulmonary disease)   . PAD (peripheral artery disease) 2007    s/p L Ileac A stent ; most recent Dopplers July 2012: Less than 50% reduction bilaterally. ABI 0.96 on the right 0.88 on left;;;12/27/2011   -ABI right .87 and left ABI .78  ,LEFT CIA and EIA stent normall patency, left CFA,SFA,and popliteal 0-49%; rgt proximal SFA 50-69%,rgt CIA,EIA, and CFA 0-49%  . Chronic low back pain     scoliosis & lordosis  . H/O: pneumonia   . Hypothyroidism   . Anxiety   . Osteoarthritis of back     And neck  . Cervical neck pain with evidence of disc disease     with need for surgery -- November 2015   Past Surgical History  Procedure Laterality Date  . Iliac artery stent Left 04/06/2005    Ex Iliac - CFA (Smart STENTS -- 7x4 in EIA, 6 x 3 CFA)   . Coronary artery bypass graft  2009    LIMA-LAD, SVG-RCA (also MAZE & AV fistula ligation)  . Maze  2009     along with CABG  . Cardioversion  04/05/2012    Procedure: CARDIOVERSION;  Surgeon: Thurmon Fair, MD;  Location: [facility];  Service: Cardiovascular;  Laterality: N/A;  . Transthoracic echocardiogram  Jan 2012    EF ~40-45%, global HK; PAP ~45-50 mmHg  . Nm myoview ltd  May 2013    No ischemia or Infarct  . Abdominal hysterectomy    . Cardiac catheterization  07/30/2007    75% LAD ,3 stenoses of 75-90% in  RCA;circumflex from proximal RCA with no lesion seen,normal ramus intermediate branh; normal LV systoilc function  . Nm cardiolite ltd  12/2013    Non-gated for Afib; No Ischemia or Infarction.  Marland Kitchen Anterior cervical decomp/discectomy fusion N/A 01/23/2014    Procedure: ANTERIOR CERVICAL DECOMPRESSION/DISCECTOMY FUSION CERVICAL FOUR-FIVE,CERVICAL FIVE-SIX,CERVICAL SIX-SEVEN;  Surgeon: Mariam Dollar, MD;  Location: [facility];  Service: Neurosurgery;  Laterality: N/A;   History  Substance Use Topics  . Smoking status: Former Smoker -- 0.50 packs/day    Types: Cigarettes    Quit date: 01/07/1998  . Smokeless tobacco: Not on file  . Alcohol Use: No   ROS as above Medications: No current facility-administered medications for this encounter.   Current Outpatient Prescriptions  Medication Sig Dispense Refill  . aspirin  81 MG tablet Take 81 mg by mouth daily.    . CRESTOR 5 MG tablet Take 5 mg by mouth every evening.     . docusate sodium (COLACE) 100 MG capsule Take 1 capsule (100 mg total) by mouth every 12 (twelve) hours. 60 capsule 0  . furosemide (LASIX) 20 MG tablet Take 1 tablet (20 mg total) by mouth daily. (Patient taking differently: Take 40 mg by mouth daily. ) 90 tablet 3  . levothyroxine (SYNTHROID, LEVOTHROID) 75 MCG tablet Take 75 mcg by mouth daily before breakfast.    . metoprolol (LOPRESSOR) 50 MG tablet Take 0.5 tablets (25 mg total) by mouth 2 (two) times daily. 60 tablet 6  . potassium chloride SA (K-DUR,KLOR-CON) 20 MEQ tablet Take 20 mEq by mouth daily.    Marland Kitchen  amiodarone (PACERONE) 200 MG tablet TAKE 1 TABLET (200 MG TOTAL) BY MOUTH DAILY AS DIRECTED. 30 tablet 11  . Digoxin (LANOXIN) 62.5 MCG TABS Take 31.25 mcg by mouth daily. Take 1/2 tablet daily=31.59mcg 15 tablet 3  . HYDROcodone-acetaminophen (NORCO) 7.5-325 MG per tablet Take 1 tablet by mouth every 8 (eight) hours as needed for moderate pain. 30 tablet 0  . ondansetron (ZOFRAN ODT) 8 MG disintegrating tablet Take 1 tablet (8 mg total) by mouth every 8 (eight) hours as needed for nausea. 20 tablet 0  . PRADAXA 75 MG CAPS capsule TAKE 1 CAPSULE (75 MG TOTAL) BY MOUTH 2 (TWO) TIMES DAILY. 60 capsule 5  . promethazine (PHENERGAN) 12.5 MG tablet Take 12.5 mg by mouth every 6 (six) hours as needed for nausea or vomiting.    Marland Kitchen tiZANidine (ZANAFLEX) 2 MG tablet Take 1 tablet (2 mg total) by mouth once. 60 tablet 0   No Known Allergies   Exam:  BP 138/78 mmHg  Pulse 84  Temp(Src) 97.1 F (36.2 C) (Oral)  Resp 18  SpO2 96% Gen: Well NAD HEENT: EOMI,  MMM Lungs: Normal work of breathing. CTABL Heart: RRR no MRG Abd: NABS, Soft. Nondistended, Nontender Exts: Brisk capillary refill, warm and well perfused.  Back: Nontender to spinal midline. Tender palpation bilateral lumbar paraspinal and thoracic paraspinals. Back range of motion is diminished. Lower extremity strength is equal and normal throughout. Normal gait.  No results found for this or any previous visit (from the past 24 hour(s)). Dg Thoracic Spine 2 View  07/04/2014   CLINICAL DATA:  Fall, mid to lower back pain since 06/29/2014  EXAM: THORACIC SPINE - 2 VIEW  COMPARISON:  05/02/2014  FINDINGS: Stable mild compression deformity of the superior endplates at T6 and T11. No significant interval change or new compression fracture. No focal kyphosis. C4-C7 anterior cervical discectomy and fusion noted on the swimmer's view. Diffuse degenerative changes noted throughout the spine. Mild S-shaped scoliosis on the frontal view. Normal paraspinal  soft tissues. Visualized lungs are clear. Heart is enlarged. Prior coronary bypass noted. Bones appear osteopenic.  IMPRESSION: Stable mild compression deformities at T6 and T11.  Degenerative changes and mild scoliosis  No definite acute finding or interval change.   Electronically Signed   By: Judie Petit.  Shick M.D.   On: 07/04/2014 14:18   Dg Lumbar Spine Complete  07/04/2014   CLINICAL DATA:  Larey Seat backwards with buttock pain, initial encounter  EXAM: LUMBAR SPINE - COMPLETE 4+ VIEW  COMPARISON:  None.  FINDINGS: No compression deformities are identified. Diffuse vascular calcifications are seen with stenting in the left common iliac artery. No pars defects are seen. Multilevel disc space narrowing  is noted. Osteophytic changes are seen. Diffuse osteopenia is noted as well.  IMPRESSION: Degenerative change without acute abnormality.   Electronically Signed   By: Alcide CleverMark  Lukens M.D.   On: 07/04/2014 14:15    Assessment and Plan: 74 y.o. female with lumbago due to significant DDD. This is a chronic issue. Slight increase of hydrocodone. Follow-up with spine surgeon.  Discussed warning signs or symptoms. Please see discharge instructions. Patient expresses understanding.     Rodolph BongEvan S Prinston Kynard, MD 07/04/14 [phone]

## 2014-07-04 NOTE — ED Notes (Signed)
Reports pt fell backwards and landed on buttocks and back hit wooden stool on 3/28 Hx of cervical surgery  Hurts to move and to sit Alert, no signs of acute distress.

## 2014-07-04 NOTE — Discharge Instructions (Signed)
Thank you for coming in today. Follow-up with your spine doctor. Stop taking hydrocodone 5 mg. Use hydrocodone 7 mg sparingly as needed for pain. Return as needed.  Chronic Back Pain  When back pain lasts longer than 3 months, it is called chronic back pain.People with chronic back pain often go through certain periods that are more intense (flare-ups).  CAUSES Chronic back pain can be caused by wear and tear (degeneration) on different structures in your back. These structures include:  The bones of your spine (vertebrae) and the joints surrounding your spinal cord and nerve roots (facets).  The strong, fibrous tissues that connect your vertebrae (ligaments). Degeneration of these structures may result in pressure on your nerves. This can lead to constant pain. HOME CARE INSTRUCTIONS  Avoid bending, heavy lifting, prolonged sitting, and activities which make the problem worse.  Take brief periods of rest throughout the day to reduce your pain. Lying down or standing usually is better than sitting while you are resting.  Take over-the-counter or prescription medicines only as directed by your caregiver. SEEK IMMEDIATE MEDICAL CARE IF:   You have weakness or numbness in one of your legs or feet.  You have trouble controlling your bladder or bowels.  You have nausea, vomiting, abdominal pain, shortness of breath, or fainting. Document Released: 04/27/2004 Document Revised: 06/12/2011 Document Reviewed: 03/04/2011 [facility] Patient Information 2015 [employer] [place]. This information is not intended to replace advice given to you by your health care provider. Make sure you discuss any questions you have with your health care provider.

## 2014-07-20 ENCOUNTER — Other Ambulatory Visit: Payer: Self-pay | Admitting: Cardiology

## 2014-08-04 ENCOUNTER — Telehealth: Payer: Self-pay | Admitting: *Deleted

## 2014-08-04 NOTE — Telephone Encounter (Signed)
RECEIVED ENCOUNTER  COLONSCOPY SCHEDULE 09/09/2014 BY DR. Jolee EwingJYOTHI Glass  CAN PATIENT STOP PRADAXA PRIOR TO PROCEDURE? IF SO, FOR HOW LONG?

## 2014-08-05 NOTE — Telephone Encounter (Signed)
FOR HOW LONG?

## 2014-08-05 NOTE — Telephone Encounter (Signed)
OK to stop anticoagulation for procedure.  Marykay LexHARDING, DAVID W, MD

## 2014-08-06 NOTE — Telephone Encounter (Signed)
4 [facility]  [facility]

## 2014-08-07 ENCOUNTER — Ambulatory Visit (INDEPENDENT_AMBULATORY_CARE_PROVIDER_SITE_OTHER): Payer: Medicare HMO | Admitting: Cardiology

## 2014-08-07 VITALS — BP 132/82 | HR 71 | Ht 60.0 in | Wt 86.7 lb

## 2014-08-07 DIAGNOSIS — I482 Chronic atrial fibrillation, unspecified: Secondary | ICD-10-CM

## 2014-08-07 DIAGNOSIS — E785 Hyperlipidemia, unspecified: Secondary | ICD-10-CM

## 2014-08-07 DIAGNOSIS — I2510 Atherosclerotic heart disease of native coronary artery without angina pectoris: Secondary | ICD-10-CM

## 2014-08-07 DIAGNOSIS — Z951 Presence of aortocoronary bypass graft: Secondary | ICD-10-CM

## 2014-08-07 DIAGNOSIS — I255 Ischemic cardiomyopathy: Secondary | ICD-10-CM | POA: Diagnosis not present

## 2014-08-07 DIAGNOSIS — I10 Essential (primary) hypertension: Secondary | ICD-10-CM

## 2014-08-07 DIAGNOSIS — I739 Peripheral vascular disease, unspecified: Secondary | ICD-10-CM

## 2014-08-07 NOTE — Telephone Encounter (Signed)
ROUTED NOTE TO DR MANN'S OFFICE ATTN ASHLEY DENNY.

## 2014-08-07 NOTE — Progress Notes (Signed)
PCP: Herb Grays, MD  Clinic Note: Chief Complaint  Patient presents with  . Follow-up    3 month, pt denied chest pain and SOB  . Coronary Artery Disease  . Atrial Fibrillation    Chronic    HPI: [name] is a 74 y.o. 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  01/30-31/2016 - A. fib RVR --> with beta blocker in ER.  05/06/2014 - Seen by Robbie Lis, PA-C, was hypotensive with blood pressure 84/54 mmHg, but asymptomatic without dizziness.  A. fib rate was 105. Noted mild fatigue, but no dyspnea. Digoxin low dose was restarted. Lasix dose reduced to 20 mg daily. 05/13/2014 - follow-up with me - rate was still 104. Had not yet started digoxin -> digoxin reordered with instructions  She has had her neck surgery, but is sill limited by neck & back pain.  Past Medical History  Diagnosis Date  . S/P CABG x 2 2009    LIMA-LAD, SVG-RCA, with AV fistula ligation and Maze procedure  . CAD (coronary artery disease), native coronary artery 2009    75% LAD, diffuse 75-90% RCA --> Referred for CABG + MAZE;  Cardiolite 12/2013: No ischemia or Infarction  . Ischemic cardiomyopathy 2012    EF ~40-45% by Echo  . PAF (paroxysmal atrial fibrillation) 04/05/2012    s/p MAZE -- recurrence, cardioversion-converted to sinus bradycardia --> Now persistent despite being on Amiodarone & Pradaxa  . Essential hypertension   . Dyslipidemia, goal LDL below 70     On Crestor, followed by PCP  . COPD (chronic obstructive pulmonary disease)   . PAD (peripheral artery disease) 2007    s/p L Ileac A stent ; most recent Dopplers July 2012: Less than 50% reduction bilaterally. ABI 0.96 on the right 0.88 on left;;;12/27/2011   -ABI right .87 and left ABI .78  ,LEFT CIA and EIA stent normall patency, left CFA,SFA,and popliteal 0-49%; rgt  proximal SFA 50-69%,rgt CIA,EIA, and CFA 0-49%  . Chronic low back pain     scoliosis & lordosis  . H/O: pneumonia   . Hypothyroidism   . Anxiety   . Osteoarthritis of back     And neck  . Cervical neck pain with evidence of disc disease     with need for surgery -- November 2015   Interval History: She actually presents today doing overall pretty well from a cardiac standpoint. She denies any sensation of rapid or irregular heartbeat. She has been taking the digoxin and her rate has been better controlled -- she therefore notes being less dyspneic.Marland Kitchen He is trying to eat better and gain some weight back.   She is still mostly bothered by her neck pain, but it has improved. This and the fact that she is less independent because of her vision issues cause her significant social stress. He still has a hard time with staying asleep, but denies PND, orthopnea symptoms. Minimal edema - despite having Lasix dose reduced. Mild exertional dyspnea with more significant exertion. Otherwise, no chest tightness or pressure or dyspnea with routine activity.  She denies  rapid/irregular heartbeats, syncope/near syncope or TIA/amaurosis fugax symptoms. No orthostatic symptoms. No claudication.  ROS: A comprehensive was performed. Review of Systems  Constitutional: Negative for fever, chills and malaise/fatigue.  Respiratory: Positive for cough.  Mild baseline exertional dyspnea that is no change from before.  Cardiovascular: Negative for claudication (Not really) and leg swelling.  Gastrointestinal: Negative for blood in stool and melena.       She thinks she has recurrent inguinal hernia with swelling and discomfort in her right groin area.  Genitourinary: Negative for hematuria.  Musculoskeletal: Positive for back pain and neck pain.  Neurological: Positive for dizziness (Sometimes in the morning before she has eaten).  Endo/Heme/Allergies: Bruises/bleeds easily.  Psychiatric/Behavioral: Positive  for depression and memory loss. The patient is nervous/anxious and has insomnia.   All other systems reviewed and are negative.   Current Outpatient Prescriptions on File Prior to Visit  Medication Sig Dispense Refill  . amiodarone (PACERONE) 200 MG tablet TAKE 1 TABLET (200 MG TOTAL) BY MOUTH DAILY AS DIRECTED. 30 tablet 11  . aspirin 81 MG tablet Take 81 mg by mouth daily.    . CRESTOR 5 MG tablet Take 5 mg by mouth every evening.     . Digoxin (LANOXIN) 62.5 MCG TABS Take 31.25 mcg by mouth daily. Take 1/2 tablet daily=31.5825mcg 15 tablet 3  . docusate sodium (COLACE) 100 MG capsule Take 1 capsule (100 mg total) by mouth every 12 (twelve) hours. 60 capsule 0  . levothyroxine (SYNTHROID, LEVOTHROID) 75 MCG tablet Take 75 mcg by mouth daily before breakfast.    . metoprolol (LOPRESSOR) 50 MG tablet Take 0.5 tablets (25 mg total) by mouth 2 (two) times daily. 60 tablet 6  . potassium chloride SA (K-DUR,KLOR-CON) 20 MEQ tablet Take 20 mEq by mouth daily.    Marland Kitchen. PRADAXA 75 MG CAPS capsule TAKE 1 CAPSULE (75 MG TOTAL) BY MOUTH 2 (TWO) TIMES DAILY. 60 capsule 5   No current facility-administered medications on file prior to visit.   No Known Allergies  History  Substance Use Topics  . Smoking status: Former Smoker -- 0.50 packs/day    Types: Cigarettes    Quit date: 01/07/1998  . Smokeless tobacco: Not on file  . Alcohol Use: No   Family History  Problem Relation Age of Onset  . Heart attack Mother   . Stroke Father   . Heart disease Brother 60    Wt Readings from Last 3 Encounters:  08/07/14 39.327 kg (86 lb 11.2 oz)  06/13/14 39.917 kg (88 lb)  06/05/14 38.919 kg (85 lb 12.8 oz)    PHYSICAL EXAM BP 132/82 mmHg  Pulse 71  Ht 5' (1.524 m)  Wt 39.327 kg (86 lb 11.2 oz)  BMI 16.93 kg/m2 General appearance: alert, cooperative, appears stated age, no distress and otherwise healthy-appearing. Neck: no adenopathy, no carotid bruit and no JVD Lungs: clear to auscultation  bilaterally, normal percussion bilaterally and non-labored Heart: irregularly irregular rhythm & borderline tachycardic (HRon exam is less than EKG); S1 & S2 normal, no murmur, click, rub or gallop, nondisplaced PMI Abdomen: soft, non-tender; bowel sounds normal; no masses,  no organomegaly Extremities: extremities normal, atraumatic, no cyanosis, or edema  Pulses: 2+ and symmetric;  Neurologic: Mental status: Alert, oriented, thought content appropriate Cranial nerves: normal (II-XII grossly intact)   Adult ECG Report  Rate: 104;  Rhythm: atrial fibrillation; R superior axis with IRBBB c/s Pulmonary Disease pattern.  CRO Septal MI, age undetermined --   Narrative Interpretation: No significant change.  Recent Labs:     Chemistry      Component Value Date/Time   NA 138 06/13/2014 1245   K 4.0 06/13/2014 1245   CL 96 06/13/2014 1245  CO2 33* 06/13/2014 1245   BUN 21 06/13/2014 1245   CREATININE 1.13* 06/13/2014 1245      Component Value Date/Time   CALCIUM 9.0 06/13/2014 1245   ALKPHOS 69 06/13/2014 1245   AST 45* 06/13/2014 1245   ALT 37* 06/13/2014 1245   BILITOT 0.9 06/13/2014 1245     Lab Results  Component Value Date   CHOL 142 12/02/2013   HDL 62.10 12/02/2013   LDLCALC 63 12/02/2013   TRIG 83.0 12/02/2013   CHOLHDL 2 12/02/2013    ASSESSMENT / PLAN: Problem List Items Addressed This Visit    2 Vessel CAD - s/p CABG; LIMA-LAD, SVG-RCA - Primary (Chronic)    Stable with no active angina symptoms despite having A. fib with RVR. Recent negative Myoview. Plan: Continue aspirin, beta blocker and statin.      Relevant Medications   furosemide (LASIX) 40 MG tablet   Other Relevant Orders   EKG 12-Lead   Hepatic function panel   Digoxin level   Chronic atrial fibrillation (Chronic)    Chronic persistent atrial fibrillation with amiodarone being used mostly for rate control. This is in addition to low dose metoprolol and digoxin. Anticoagulated with Pradaxa. No  bleeding issues.      Relevant Medications   furosemide (LASIX) 40 MG tablet   Other Relevant Orders   EKG 12-Lead   Hepatic function panel   Digoxin level   Essential hypertension (Chronic)   Relevant Medications   furosemide (LASIX) 40 MG tablet   Hyperlipidemia with target LDL less than 70 (Chronic)    On Crestor, 5 mg. Labs are due to check at next follow-up.      Relevant Medications   furosemide (LASIX) 40 MG tablet   Other Relevant Orders   EKG 12-Lead   Hepatic function panel   Digoxin level   Ischemic cardiomyopathy (Chronic)    NYHA class II symptoms. On reduced Lasix of 20 mg daily and no significant signs of hypervolemia. She does appear to be euvolemic. She is on a beta blocker as well as amiodarone and digoxin. On standing dose Lasix.      Relevant Medications   furosemide (LASIX) 40 MG tablet   Other Relevant Orders   EKG 12-Lead   Hepatic function panel   Digoxin level   PAD (peripheral artery disease) (Chronic)    No active claudication.      Relevant Medications   furosemide (LASIX) 40 MG tablet   S/P CABG x 2 (Chronic)      Surveillance labs for amiodarone and digoxin Orders Placed This Encounter  Procedures  . Hepatic function panel  . Digoxin level  . EKG 12-Lead   Meds ordered this encounter  Medications  . furosemide (LASIX) 40 MG tablet    Sig: Take 1 tablet by mouth daily.  Marland Kitchen. HYDROcodone-acetaminophen (NORCO/VICODIN) 5-325 MG per tablet    Sig: Take 1 tablet by mouth as needed.    Followup: 3 months   Michayla Mcneil, Piedad ClimesAVID W, M.D., M.S. Interventional Cardiologist   Pager # [phone]

## 2014-08-07 NOTE — Patient Instructions (Signed)
No change with current medications  Next time you get labs--please have digoxin level, liver function test ( hepatic)  Your physician wants you to follow-up in 6 MONTH DR HARDING. 3 0 MIN APPOINTMENT.  You will receive a reminder letter in the mail two months in advance. If you don't receive a letter, please call our office to schedule the follow-up appointment.

## 2014-08-09 ENCOUNTER — Encounter: Payer: Self-pay | Admitting: Cardiology

## 2014-08-09 NOTE — Assessment & Plan Note (Signed)
Stable with no active angina symptoms despite having A. fib with RVR. Recent negative Myoview. Plan: Continue aspirin, beta blocker and statin.

## 2014-08-09 NOTE — Assessment & Plan Note (Signed)
No active claudication 

## 2014-08-09 NOTE — Assessment & Plan Note (Addendum)
On Crestor, 5 mg. Labs are due to check at next follow-up.

## 2014-08-09 NOTE — Assessment & Plan Note (Signed)
Chronic persistent atrial fibrillation with amiodarone being used mostly for rate control. This is in addition to low dose metoprolol and digoxin. Anticoagulated with Pradaxa. No bleeding issues.

## 2014-08-09 NOTE — Assessment & Plan Note (Signed)
NYHA class II symptoms. On reduced Lasix of 20 mg daily and no significant signs of hypervolemia. She does appear to be euvolemic. She is on a beta blocker as well as amiodarone and digoxin. On standing dose Lasix.

## 2014-08-18 ENCOUNTER — Other Ambulatory Visit: Payer: Self-pay | Admitting: Cardiology

## 2014-08-21 MED ORDER — RIVAROXABAN 15 MG PO TABS
ORAL_TABLET | ORAL | Status: DC
Start: 2014-08-21 — End: 2014-11-19

## 2014-08-21 NOTE — Telephone Encounter (Signed)
Pt on Pradaxa but CrCl is < 30 and patient on amiodarone.  Cannot use Pradaxa.  Will switch to Xarelto 15 mg qd with meal.  Pt voiced understanding

## 2014-08-28 ENCOUNTER — Other Ambulatory Visit: Payer: Self-pay | Admitting: Cardiology

## 2014-09-07 ENCOUNTER — Other Ambulatory Visit: Payer: Self-pay | Admitting: Gastroenterology

## 2014-10-19 ENCOUNTER — Other Ambulatory Visit: Payer: Self-pay | Admitting: Cardiology

## 2014-10-19 NOTE — Telephone Encounter (Signed)
Rx(s) sent to pharmacy electronically.  

## 2014-11-03 ENCOUNTER — Emergency Department (HOSPITAL_COMMUNITY): Payer: Medicare HMO

## 2014-11-03 ENCOUNTER — Encounter (HOSPITAL_COMMUNITY): Payer: Self-pay | Admitting: *Deleted

## 2014-11-03 ENCOUNTER — Other Ambulatory Visit: Payer: Self-pay

## 2014-11-03 ENCOUNTER — Emergency Department (HOSPITAL_COMMUNITY)
Admission: EM | Admit: 2014-11-03 | Discharge: 2014-11-04 | Disposition: A | Discharge status: Home / Self Care | Payer: Medicare HMO | Attending: Emergency Medicine | Admitting: Emergency Medicine

## 2014-11-03 DIAGNOSIS — Z79899 Other long term (current) drug therapy: Secondary | ICD-10-CM | POA: Diagnosis not present

## 2014-11-03 DIAGNOSIS — M79602 Pain in left arm: Secondary | ICD-10-CM | POA: Insufficient documentation

## 2014-11-03 DIAGNOSIS — I4891 Unspecified atrial fibrillation: Secondary | ICD-10-CM | POA: Diagnosis not present

## 2014-11-03 DIAGNOSIS — I2510 Atherosclerotic heart disease of native coronary artery without angina pectoris: Secondary | ICD-10-CM | POA: Diagnosis not present

## 2014-11-03 DIAGNOSIS — M546 Pain in thoracic spine: Secondary | ICD-10-CM | POA: Insufficient documentation

## 2014-11-03 DIAGNOSIS — M19041 Primary osteoarthritis, right hand: Secondary | ICD-10-CM | POA: Diagnosis not present

## 2014-11-03 DIAGNOSIS — I739 Peripheral vascular disease, unspecified: Secondary | ICD-10-CM | POA: Diagnosis not present

## 2014-11-03 DIAGNOSIS — M4050 Lordosis, unspecified, site unspecified: Secondary | ICD-10-CM | POA: Diagnosis not present

## 2014-11-03 DIAGNOSIS — J449 Chronic obstructive pulmonary disease, unspecified: Secondary | ICD-10-CM | POA: Diagnosis not present

## 2014-11-03 DIAGNOSIS — Z951 Presence of aortocoronary bypass graft: Secondary | ICD-10-CM | POA: Diagnosis not present

## 2014-11-03 DIAGNOSIS — F419 Anxiety disorder, unspecified: Secondary | ICD-10-CM | POA: Diagnosis not present

## 2014-11-03 DIAGNOSIS — M542 Cervicalgia: Secondary | ICD-10-CM | POA: Diagnosis not present

## 2014-11-03 DIAGNOSIS — M419 Scoliosis, unspecified: Secondary | ICD-10-CM | POA: Diagnosis not present

## 2014-11-03 DIAGNOSIS — E039 Hypothyroidism, unspecified: Secondary | ICD-10-CM | POA: Diagnosis not present

## 2014-11-03 DIAGNOSIS — J441 Chronic obstructive pulmonary disease with (acute) exacerbation: Secondary | ICD-10-CM | POA: Diagnosis not present

## 2014-11-03 DIAGNOSIS — Z8701 Personal history of pneumonia (recurrent): Secondary | ICD-10-CM | POA: Diagnosis not present

## 2014-11-03 DIAGNOSIS — E785 Hyperlipidemia, unspecified: Secondary | ICD-10-CM | POA: Diagnosis not present

## 2014-11-03 DIAGNOSIS — R634 Abnormal weight loss: Secondary | ICD-10-CM | POA: Diagnosis not present

## 2014-11-03 DIAGNOSIS — I480 Paroxysmal atrial fibrillation: Secondary | ICD-10-CM | POA: Diagnosis not present

## 2014-11-03 DIAGNOSIS — R194 Change in bowel habit: Secondary | ICD-10-CM | POA: Diagnosis not present

## 2014-11-03 DIAGNOSIS — I10 Essential (primary) hypertension: Secondary | ICD-10-CM | POA: Insufficient documentation

## 2014-11-03 DIAGNOSIS — I255 Ischemic cardiomyopathy: Secondary | ICD-10-CM | POA: Diagnosis not present

## 2014-11-03 DIAGNOSIS — Z87891 Personal history of nicotine dependence: Secondary | ICD-10-CM | POA: Insufficient documentation

## 2014-11-03 DIAGNOSIS — M545 Low back pain: Secondary | ICD-10-CM | POA: Diagnosis not present

## 2014-11-03 DIAGNOSIS — Z90710 Acquired absence of both cervix and uterus: Secondary | ICD-10-CM | POA: Diagnosis not present

## 2014-11-03 DIAGNOSIS — G8929 Other chronic pain: Secondary | ICD-10-CM | POA: Diagnosis not present

## 2014-11-03 DIAGNOSIS — M19042 Primary osteoarthritis, left hand: Secondary | ICD-10-CM | POA: Diagnosis not present

## 2014-11-03 DIAGNOSIS — M479 Spondylosis, unspecified: Secondary | ICD-10-CM | POA: Diagnosis not present

## 2014-11-03 LAB — URINALYSIS, ROUTINE W REFLEX MICROSCOPIC
BILIRUBIN URINE: NEGATIVE
Glucose, UA: NEGATIVE mg/dL
Hgb urine dipstick: NEGATIVE
Ketones, ur: NEGATIVE mg/dL
Nitrite: NEGATIVE
Protein, ur: NEGATIVE mg/dL
Specific Gravity, Urine: 1.004 — ABNORMAL LOW (ref 1.005–1.030)
UROBILINOGEN UA: 0.2 mg/dL (ref 0.0–1.0)
pH: 7 (ref 5.0–8.0)

## 2014-11-03 LAB — BASIC METABOLIC PANEL
Anion gap: 12 (ref 5–15)
BUN: 16 mg/dL (ref 6–20)
CALCIUM: 9.4 mg/dL (ref 8.9–10.3)
CO2: 27 mmol/L (ref 22–32)
Chloride: 97 mmol/L — ABNORMAL LOW (ref 101–111)
Creatinine, Ser: 0.97 mg/dL (ref 0.44–1.00)
GFR calc Af Amer: >60 mL/min (ref >60)
GFR, EST NON AFRICAN AMERICAN: 56 mL/min — AB (ref >60)
Glucose, Bld: 132 mg/dL — ABNORMAL HIGH (ref 65–99)
POTASSIUM: 4.1 mmol/L (ref 3.5–5.1)
Sodium: 136 mmol/L (ref 135–145)

## 2014-11-03 LAB — CBC
HCT: 41.3 % (ref 36.0–46.0)
HEMOGLOBIN: 12.3 g/dL (ref 12.0–15.0)
MCH: 22.4 pg — ABNORMAL LOW (ref 26.0–34.0)
MCHC: 29.8 g/dL — AB (ref 30.0–36.0)
MCV: 75.4 fL — ABNORMAL LOW (ref 78.0–100.0)
Platelets: 219 10*3/uL (ref 150–400)
RBC: 5.48 MIL/uL — AB (ref 3.87–5.11)
RDW: 15.9 % — ABNORMAL HIGH (ref 11.5–15.5)
WBC: 6.1 10*3/uL (ref 4.0–10.5)

## 2014-11-03 LAB — TROPONIN I: Troponin I: <0.03 ng/mL (ref <0.031)

## 2014-11-03 LAB — I-STAT TROPONIN, ED: Troponin i, poc: 0 ng/mL (ref 0.00–0.08)

## 2014-11-03 LAB — URINE MICROSCOPIC-ADD ON

## 2014-11-03 MED ORDER — DIAZEPAM 2 MG PO TABS: 2.0000 mg | ORAL_TABLET | Freq: Three times a day (TID) | ORAL | Status: DC | PRN

## 2014-11-03 MED ORDER — SODIUM CHLORIDE 0.9 % IV BOLUS (SEPSIS): 500.0000 mL | Freq: Once | INTRAVENOUS | Status: DC

## 2014-11-03 MED ORDER — DIAZEPAM 2 MG PO TABS
2.0000 mg | ORAL_TABLET | Freq: Once | ORAL | Status: AC
  Administered 2014-11-03: 2 mg via ORAL
  Filled 2014-11-03: qty 1

## 2014-11-03 MED ORDER — ONDANSETRON HCL 4 MG/2ML IJ SOLN
4.0000 mg | Freq: Once | INTRAMUSCULAR | Status: DC
  Filled 2014-11-03: qty 2

## 2014-11-03 NOTE — Discharge Instructions (Signed)
Back Pain, Adult Low back pain is very common. About 1 in 5 people have back pain.The cause of low back pain is rarely dangerous. The pain often gets better over time.About half of people with a sudden onset of back pain feel better in just 2 weeks. About 8 in 10 people feel better by 6 weeks.  CAUSES Some common causes of back pain include:  Strain of the muscles or ligaments supporting the spine.  Wear and tear (degeneration) of the spinal discs.  Arthritis.  Direct injury to the back. DIAGNOSIS Most of the time, the direct cause of low back pain is not known.However, back pain can be treated effectively even when the exact cause of the pain is unknown.Answering your caregiver's questions about your overall health and symptoms is one of the most accurate ways to make sure the cause of your pain is not dangerous. If your caregiver needs more information, he or she may order lab work or imaging tests (X-rays or MRIs).However, even if imaging tests show changes in your back, this usually does not require surgery. HOME CARE INSTRUCTIONS For many people, back pain returns.Since low back pain is rarely dangerous, it is often a condition that people can learn to manageon their own.   Remain active. It is stressful on the back to sit or stand in one place. Do not sit, drive, or stand in one place for more than 30 minutes at a time. Take short walks on level surfaces as soon as pain allows.Try to increase the length of time you walk each day.  Do not stay in bed.Resting more than 1 or 2 days can delay your recovery.  Do not avoid exercise or work.Your body is made to move.It is not dangerous to be active, even though your back may hurt.Your back will likely heal faster if you return to being active before your pain is gone.  Pay attention to your body when you bend and lift. Many people have less discomfortwhen lifting if they bend their knees, keep the load close to their bodies,and  avoid twisting. Often, the most comfortable positions are those that put less stress on your recovering back.  Find a comfortable position to sleep. Use a firm mattress and lie on your side with your knees slightly bent. If you lie on your back, put a pillow under your knees.  Only take over-the-counter or prescription medicines as directed by your caregiver. Over-the-counter medicines to reduce pain and inflammation are often the most helpful.Your caregiver may prescribe muscle relaxant drugs.These medicines help dull your pain so you can more quickly return to your normal activities and healthy exercise.  Put ice on the injured area.  Put ice in a plastic bag.  Place a towel between your skin and the bag.  Leave the ice on for 15-20 minutes, 03-04 times a day for the first 2 to 3 days. After that, ice and heat may be alternated to reduce pain and spasms.  Ask your caregiver about trying back exercises and gentle massage. This may be of some benefit.  Avoid feeling anxious or stressed.Stress increases muscle tension and can worsen back pain.It is important to recognize when you are anxious or stressed and learn ways to manage it.Exercise is a great option. SEEK MEDICAL CARE IF:  You have pain that is not relieved with rest or medicine.  You have pain that does not improve in 1 week.  You have new symptoms.  You are generally not feeling well. SEEK   IMMEDIATE MEDICAL CARE IF:   You have pain that radiates from your back into your legs.  You develop new bowel or bladder control problems.  You have unusual weakness or numbness in your arms or legs.  You develop nausea or vomiting.  You develop abdominal pain.  You feel faint. Document Released: 03/20/2005 Document Revised: 09/19/2011 Document Reviewed: 07/22/2013 [employer] Patient Information 2015 [employer]. This information is not intended to replace advice given to you by your health care provider. Make sure you  discuss any questions you have with your health care provider.  

## 2014-11-03 NOTE — ED Provider Notes (Signed)
CSN: [id number]     Arrival date & time 11/03/14  1736 History   First MD Initiated Contact with Patient 11/03/14 1830     Chief Complaint  Patient presents with  . Back Pain  . Shortness of Breath  . Arm Pain     (Consider location/radiation/quality/duration/timing/severity/associated sxs/prior Treatment) HPI Comments: 74 year old female presenting with malaise and nausea. Malaise began a few days ago and has worsened today. She has been nauseated since yesterday, but has been unable to vomit. She reports that she has been dry heaving. She did bring up "a small amount of mess" but was unable to further explain this. She has also had left arm tingling, which has been intermittent for some time.  She also complains of intermittent back pain. This is been present for about a month. It comes and goes. It seems to worsen with certain movements including standing up from sitting or trying to use the bathroom. She does not have any pain currently, but is worried that it is going to start.  I think her worrying about her back pain is a large part of why she is here currently.  Additional history was obtained from the patient's daughter. She states that her mother has been complaining primarily of back pain. She also had some left arm tingling yesterday, but not today. She suspects that anxiety plays a role in her symptoms. The patient denies bowel or bladder dysfunction, perianal numbness, lower extremity weakness, or fevers.   Past Medical History  Diagnosis Date  . S/P CABG x 2 2009    LIMA-LAD, SVG-RCA, with AV fistula ligation and Maze procedure  . CAD (coronary artery disease), native coronary artery 2009    75% LAD, diffuse 75-90% RCA --> Referred for CABG + MAZE;  Cardiolite 12/2013: No ischemia or Infarction  . Ischemic cardiomyopathy 2012    EF ~40-45% by Echo  . PAF (paroxysmal atrial fibrillation) 04/05/2012    s/p MAZE -- recurrence, cardioversion-converted to sinus bradycardia --> Now  persistent despite being on Amiodarone & Pradaxa  . Essential hypertension   . Dyslipidemia, goal LDL below 70     On Crestor, followed by PCP  . COPD (chronic obstructive pulmonary disease)   . PAD (peripheral artery disease) 2007    s/p L Ileac A stent ; most recent Dopplers July 2012: Less than 50% reduction bilaterally. ABI 0.96 on the right 0.88 on left;;;12/27/2011   -ABI right .87 and left ABI .78  ,LEFT CIA and EIA stent normall patency, left CFA,SFA,and popliteal 0-49%; rgt proximal SFA 50-69%,rgt CIA,EIA, and CFA 0-49%  . Chronic low back pain     scoliosis & lordosis  . H/O: pneumonia   . Hypothyroidism   . Anxiety   . Osteoarthritis of back     And neck  . Cervical neck pain with evidence of disc disease     with need for surgery -- November 2015   Past Surgical History  Procedure Laterality Date  . Iliac artery stent Left 04/06/2005    Ex Iliac - CFA (Smart STENTS -- 7x4 in EIA, 6 x 3 CFA)   . Coronary artery bypass graft  2009    LIMA-LAD, SVG-RCA (also MAZE & AV fistula ligation)  . Maze  2009    along with CABG  . Cardioversion  04/05/2012    Procedure: CARDIOVERSION;  Surgeon: Thurmon Fair, MD;  Location: [facility] ENDOSCOPY;  Service: Cardiovascular;  Laterality: N/A;  . Transthoracic echocardiogram  Jan 2012  EF ~40-45%, global HK; PAP ~45-50 mmHg  . Nm myoview ltd  May 2013    No ischemia or Infarct  . Abdominal hysterectomy    . Cardiac catheterization  07/30/2007    75% LAD ,3 stenoses of 75-90% in  RCA;circumflex from proximal RCA with no lesion seen,normal ramus intermediate branh; normal LV systoilc function  . Nm cardiolite ltd  12/2013    Non-gated for Afib; No Ischemia or Infarction.  Marland Kitchen Anterior cervical decomp/discectomy fusion N/A 01/23/2014    Procedure: ANTERIOR CERVICAL DECOMPRESSION/DISCECTOMY FUSION CERVICAL FOUR-FIVE,CERVICAL FIVE-SIX,CERVICAL SIX-SEVEN;  Surgeon: Mariam Dollar, MD;  Location: [facility] NEURO ORS;  Service: Neurosurgery;  Laterality: N/A;    Family History  Problem Relation Age of Onset  . Heart attack Mother   . Stroke Father   . Heart disease Brother 19   History  Substance Use Topics  . Smoking status: Former Smoker -- 0.50 packs/day    Types: Cigarettes    Quit date: 01/07/1998  . Smokeless tobacco: Not on file  . Alcohol Use: No   OB History    No data available     Review of Systems  All other systems reviewed and are negative.     Allergies  Review of patient's allergies indicates no known allergies.  Home Medications   Prior to Admission medications   Medication Sig Start Date End Date Taking? Authorizing Provider  amiodarone (PACERONE) 200 MG tablet TAKE 1 TABLET (200 MG TOTAL) BY MOUTH DAILY AS DIRECTED. 06/05/14   Marykay Lex, MD  Cholecalciferol (VITAMIN D) 2000 UNITS CAPS Take 2,000 Units by mouth daily.    Historical Provider, MD  CRESTOR 5 MG tablet TAKE 1 TABLET BY MOUTH IN THE EVENING 10/19/14   Marykay Lex, MD  docusate sodium (COLACE) 100 MG capsule Take 1 capsule (100 mg total) by mouth every 12 (twelve) hours. Patient not taking: Reported on 10/22/2014 06/13/14   Derwood Kaplan, MD  furosemide (LASIX) 40 MG tablet Take 1 tablet by mouth daily. 06/25/14   Historical Provider, MD  HYDROcodone-acetaminophen (NORCO/VICODIN) 5-325 MG per tablet Take 1 tablet by mouth as needed for moderate pain.  06/14/14   Historical Provider, MD  LANOXIN 62.5 MCG TABS TAKE 1/2 TABLET (31.25MCG) BY MOUTH DAILY Patient taking differently: TAKE 1/2 TABLET (31.25MCG) BY MOUTH EVERY OTHER DAY 08/28/14   Marykay Lex, MD  levothyroxine (SYNTHROID, LEVOTHROID) 75 MCG tablet Take 75 mcg by mouth daily before breakfast.    Historical Provider, MD  metoprolol (LOPRESSOR) 50 MG tablet Take 0.5 tablets (25 mg total) by mouth 2 (two) times daily. Patient taking differently: Take 12.5 mg by mouth 2 (two) times daily.  11/24/13   Marykay Lex, MD  PRADAXA 75 MG CAPS capsule TAKE 1 CAPSULE (75 MG TOTAL) BY MOUTH 2  (TWO) TIMES DAILY. Patient not taking: Reported on 10/22/2014 07/20/14   Marykay Lex, MD  Rivaroxaban (XARELTO) 15 MG TABS tablet Take 1 tablet by mouth daily with a meal.  Do not take any more Pradaxa 08/21/14   Marykay Lex, MD   BP 127/110 mmHg  Pulse 89  Temp(Src) 97.6 F (36.4 C) (Oral)  Resp 18  Ht 4\' 11"  (1.499 m)  Wt 87 lb 6.4 oz (39.644 kg)  BMI 17.64 kg/m2  SpO2 90% Physical Exam  Constitutional: She is oriented to person, place, and time. She appears well-developed and well-nourished. No distress.  HENT:  Head: Normocephalic and atraumatic.  Mouth/Throat: Oropharynx is clear and moist.  Eyes: Conjunctivae are normal. Pupils are equal, round, and reactive to light. No scleral icterus.  Neck: Neck supple.  Cardiovascular: Normal rate, normal heart sounds and intact distal pulses.  An irregularly irregular rhythm present.  No murmur heard. Pulmonary/Chest: Effort normal and breath sounds normal. No stridor. No respiratory distress. She has no rales.  Abdominal: Soft. Bowel sounds are normal. She exhibits no distension. There is no tenderness.  Musculoskeletal: Normal range of motion.       Thoracic back: She exhibits no bony tenderness.       Back:  Neurological: She is alert and oriented to person, place, and time.  Skin: Skin is warm and dry. No rash noted.  Psychiatric: She has a normal mood and affect. Her behavior is normal.  Nursing note and vitals reviewed.   ED Course  Procedures (including critical care time) Labs Review Labs Reviewed  BASIC METABOLIC PANEL - Abnormal; Notable for the following:    Chloride 97 (*)    Glucose, Bld 132 (*)    GFR calc non Af Amer 56 (*)    All other components within normal limits  CBC - Abnormal; Notable for the following:    RBC 5.48 (*)    MCV 75.4 (*)    MCH 22.4 (*)    MCHC 29.8 (*)    RDW 15.9 (*)    All other components within normal limits  URINALYSIS, ROUTINE W REFLEX MICROSCOPIC (NOT AT [facility]) - Abnormal;  Notable for the following:    Specific Gravity, Urine 1.004 (*)    Leukocytes, UA TRACE (*)    All other components within normal limits  TROPONIN I  URINE MICROSCOPIC-ADD ON  PROTIME-INR  I-STAT TROPOININ, ED    Imaging Review Dg Chest 2 View  11/03/2014   CLINICAL DATA:  Two day history of chest and left arm pain.  EXAM: CHEST  2 VIEW  COMPARISON:  04/27/2014.  FINDINGS: The cardiac silhouette, mediastinal and hilar contours are within normal limits and stable. There is tortuosity and calcification of the thoracic aorta. Stable surgical changes from bypass surgery. Chronic emphysematous and bronchitic lung changes but no acute pulmonary findings. No pleural effusion. The bony thorax is intact.  IMPRESSION: Chronic lung changes but no acute pulmonary findings.   Electronically Signed   By: Rudie Meyer M.D.   On: 11/03/2014 19:08     EKG Interpretation None      EKG - a fib, rate 107, RAD, IRBBB, no significant change from prior.   MDM   Final diagnoses:  Right-sided thoracic back pain    74 year old female presenting with back pain. Right thoracic in location. She has a small muscle spasm there. This correlates with her symptoms of pain which worsened with certain movements. Currently, except for palpated tenderness, she denies pain. She has no red flag signs or symptoms to suggest spinal cord pathology. She got relief with small dose of Valium.  Advised close PCP follow up.   Also, I don't think her left arm tingling is secondary to ACS.  She has no chest pain, SOB, diaphoresis, or dizziness.  EKG unchanged.  Troponin negative.   Blake Divine, MD 11/04/14 [phone]

## 2014-11-03 NOTE — ED Notes (Signed)
Pt reports onset yesterday of back pain, left arm tingling and now feeling nauseated today. Having mild sob, denies cp or swelling. ekg done. Airway intact. Pt has cardiac hx.

## 2014-11-09 ENCOUNTER — Encounter (HOSPITAL_COMMUNITY): Payer: Self-pay | Admitting: *Deleted

## 2014-11-11 NOTE — Anesthesia Preprocedure Evaluation (Addendum)
Anesthesia Evaluation  Patient identified by MRN, date of birth, ID band Patient awake    Reviewed: Allergy & Precautions, H&P , NPO status , Patient's Chart, lab work & pertinent test results, reviewed documented beta blocker date and time   Airway Mallampati: I  TM Distance: >3 FB Neck ROM: Full    Dental  (+) Dental Advisory Given, Missing, Poor Dentition All upper and lower front teeth missing:   Pulmonary COPDformer smoker,  breath sounds clear to auscultation  Pulmonary exam normal       Cardiovascular Exercise Tolerance: Good hypertension, Pt. on home beta blockers and Pt. on medications + CAD, + CABG and + Peripheral Vascular Disease Normal cardiovascular exam+ dysrhythmias Atrial Fibrillation Rhythm:Irregular Rate:Normal  Echo 40%. ECG = AF   Neuro/Psych Anxiety Cervical stenosis negative neurological ROS  negative psych ROS   GI/Hepatic negative GI ROS, Neg liver ROS,   Endo/Other  negative endocrine ROSHypothyroidism   Renal/GU negative Renal ROS  negative genitourinary   Musculoskeletal  (+) Arthritis -,   Abdominal   Peds  Hematology negative hematology ROS (+)   Anesthesia Other Findings   Reproductive/Obstetrics negative OB ROS                            Anesthesia Physical Anesthesia Plan  ASA: III  Anesthesia Plan: MAC   Post-op Pain Management:    Induction:   Airway Management Planned:   Additional Equipment:   Intra-op Plan:   Post-operative Plan:   Informed Consent: I have reviewed the patients History and Physical, chart, labs and discussed the procedure including the risks, benefits and alternatives for the proposed anesthesia with the patient or authorized representative who has indicated his/her understanding and acceptance.   Dental Advisory Given  Plan Discussed with: CRNA and Surgeon  Anesthesia Plan Comments:         Anesthesia Quick  Evaluation

## 2014-11-12 ENCOUNTER — Encounter (HOSPITAL_COMMUNITY)
Admission: RE | Disposition: A | Discharge status: Home / Self Care | Payer: Self-pay | Source: Ambulatory Visit | Attending: Gastroenterology

## 2014-11-12 ENCOUNTER — Ambulatory Visit (HOSPITAL_COMMUNITY): Payer: Medicare HMO | Admitting: Anesthesiology

## 2014-11-12 ENCOUNTER — Encounter (HOSPITAL_COMMUNITY): Payer: Self-pay | Admitting: Certified Registered Nurse Anesthetist

## 2014-11-12 ENCOUNTER — Ambulatory Visit (HOSPITAL_COMMUNITY)
Admission: RE | Admit: 2014-11-12 | Discharge: 2014-11-12 | Disposition: A | Discharge status: Home / Self Care | Payer: Medicare HMO | Source: Ambulatory Visit | Attending: Gastroenterology | Admitting: Gastroenterology

## 2014-11-12 DIAGNOSIS — F419 Anxiety disorder, unspecified: Secondary | ICD-10-CM | POA: Insufficient documentation

## 2014-11-12 DIAGNOSIS — M479 Spondylosis, unspecified: Secondary | ICD-10-CM | POA: Insufficient documentation

## 2014-11-12 DIAGNOSIS — G8929 Other chronic pain: Secondary | ICD-10-CM | POA: Insufficient documentation

## 2014-11-12 DIAGNOSIS — M4050 Lordosis, unspecified, site unspecified: Secondary | ICD-10-CM | POA: Insufficient documentation

## 2014-11-12 DIAGNOSIS — Z951 Presence of aortocoronary bypass graft: Secondary | ICD-10-CM | POA: Insufficient documentation

## 2014-11-12 DIAGNOSIS — M545 Low back pain: Secondary | ICD-10-CM | POA: Insufficient documentation

## 2014-11-12 DIAGNOSIS — I10 Essential (primary) hypertension: Secondary | ICD-10-CM | POA: Insufficient documentation

## 2014-11-12 DIAGNOSIS — M19042 Primary osteoarthritis, left hand: Secondary | ICD-10-CM | POA: Insufficient documentation

## 2014-11-12 DIAGNOSIS — M19041 Primary osteoarthritis, right hand: Secondary | ICD-10-CM | POA: Insufficient documentation

## 2014-11-12 DIAGNOSIS — I255 Ischemic cardiomyopathy: Secondary | ICD-10-CM | POA: Insufficient documentation

## 2014-11-12 DIAGNOSIS — R634 Abnormal weight loss: Secondary | ICD-10-CM | POA: Diagnosis not present

## 2014-11-12 DIAGNOSIS — M419 Scoliosis, unspecified: Secondary | ICD-10-CM | POA: Insufficient documentation

## 2014-11-12 DIAGNOSIS — M542 Cervicalgia: Secondary | ICD-10-CM | POA: Insufficient documentation

## 2014-11-12 DIAGNOSIS — I4891 Unspecified atrial fibrillation: Secondary | ICD-10-CM | POA: Insufficient documentation

## 2014-11-12 DIAGNOSIS — E785 Hyperlipidemia, unspecified: Secondary | ICD-10-CM | POA: Insufficient documentation

## 2014-11-12 DIAGNOSIS — I480 Paroxysmal atrial fibrillation: Secondary | ICD-10-CM | POA: Insufficient documentation

## 2014-11-12 DIAGNOSIS — I739 Peripheral vascular disease, unspecified: Secondary | ICD-10-CM | POA: Insufficient documentation

## 2014-11-12 DIAGNOSIS — E039 Hypothyroidism, unspecified: Secondary | ICD-10-CM | POA: Insufficient documentation

## 2014-11-12 DIAGNOSIS — J449 Chronic obstructive pulmonary disease, unspecified: Secondary | ICD-10-CM | POA: Insufficient documentation

## 2014-11-12 DIAGNOSIS — Z90710 Acquired absence of both cervix and uterus: Secondary | ICD-10-CM | POA: Insufficient documentation

## 2014-11-12 DIAGNOSIS — I2510 Atherosclerotic heart disease of native coronary artery without angina pectoris: Secondary | ICD-10-CM | POA: Diagnosis not present

## 2014-11-12 DIAGNOSIS — R194 Change in bowel habit: Secondary | ICD-10-CM | POA: Diagnosis not present

## 2014-11-12 DIAGNOSIS — Z87891 Personal history of nicotine dependence: Secondary | ICD-10-CM | POA: Insufficient documentation

## 2014-11-12 HISTORY — PX: COLONOSCOPY WITH PROPOFOL: SHX5780

## 2014-11-12 SURGERY — COLONOSCOPY WITH PROPOFOL
Anesthesia: Monitor Anesthesia Care

## 2014-11-12 MED ORDER — LACTATED RINGERS IV SOLN: INTRAVENOUS | Status: DC

## 2014-11-12 MED ORDER — PROPOFOL 10 MG/ML IV BOLUS
INTRAVENOUS | Status: AC
  Filled 2014-11-12: qty 20

## 2014-11-12 MED ORDER — SODIUM CHLORIDE 0.9 % IV SOLN: INTRAVENOUS | Status: DC

## 2014-11-12 MED ORDER — PROPOFOL 10 MG/ML IV BOLUS
INTRAVENOUS | Status: DC | PRN
  Administered 2014-11-12 (×4): 20 mg via INTRAVENOUS

## 2014-11-12 MED ORDER — FENTANYL CITRATE (PF) 100 MCG/2ML IJ SOLN: 25.0000 ug | INTRAMUSCULAR | Status: DC | PRN

## 2014-11-12 MED ORDER — PHENYLEPHRINE HCL 10 MG/ML IJ SOLN
INTRAMUSCULAR | Status: DC | PRN
  Administered 2014-11-12: 20 ug via INTRAVENOUS

## 2014-11-12 MED ORDER — LACTATED RINGERS IV SOLN
INTRAVENOUS | Status: DC | PRN
  Administered 2014-11-12: 07:00:00 via INTRAVENOUS

## 2014-11-12 SURGICAL SUPPLY — 22 items

## 2014-11-12 NOTE — H&P (Signed)
[name] is an 74 y.o. female.   Chief Complaint: Colorectal cancer screening. HPI: 74 year old white female here for a screening colonoscopy. She has had some changes in bowel habits with abnormal weight loss. See office notes for details.  Past Medical History  Diagnosis Date  . S/P CABG x 2 2009    LIMA-LAD, SVG-RCA, with AV fistula ligation and Maze procedure  . CAD (coronary artery disease), native coronary artery 2009    75% LAD, diffuse 75-90% RCA --> Referred for CABG + MAZE;  Cardiolite 12/2013: No ischemia or Infarction  . Ischemic cardiomyopathy 2012    EF ~40-45% by Echo  . PAF (paroxysmal atrial fibrillation) 04/05/2012    s/p MAZE -- recurrence, cardioversion-converted to sinus bradycardia --> Now persistent despite being on Amiodarone & Pradaxa  . Essential hypertension   . Dyslipidemia, goal LDL below 70     On Crestor, followed by PCP  . COPD (chronic obstructive pulmonary disease)   . PAD (peripheral artery disease) 2007    s/p L Ileac A stent ; most recent Dopplers July 2012: Less than 50% reduction bilaterally. ABI 0.96 on the right 0.88 on left;;;12/27/2011   -ABI right .87 and left ABI .78  ,LEFT CIA and EIA stent normall patency, left CFA,SFA,and popliteal 0-49%; rgt proximal SFA 50-69%,rgt CIA,EIA, and CFA 0-49%  . Chronic low back pain     scoliosis & lordosis  . H/O: pneumonia   . Hypothyroidism   . Anxiety   . Cervical neck pain with evidence of disc disease     with need for surgery -- November 2015  . Osteoarthritis of back     And neck, hands,spine   Past Surgical History  Procedure Laterality Date  . Iliac artery stent Left 04/06/2005    Ex Iliac - CFA (Smart STENTS -- 7x4 in EIA, 6 x 3 CFA)   . Coronary artery bypass graft  2009    LIMA-LAD, SVG-RCA (also MAZE & AV fistula ligation)  . Maze  2009    along with CABG  . Cardioversion  04/05/2012    Procedure: CARDIOVERSION;  Surgeon: Thurmon Fair, MD;  Location: [facility] ENDOSCOPY;  Service:  Cardiovascular;  Laterality: N/A;  . Transthoracic echocardiogram  Jan 2012    EF ~40-45%, global HK; PAP ~45-50 mmHg  . Nm myoview ltd  May 2013    No ischemia or Infarct  . Abdominal hysterectomy    . Cardiac catheterization  07/30/2007    75% LAD ,3 stenoses of 75-90% in  RCA;circumflex from proximal RCA with no lesion seen,normal ramus intermediate branh; normal LV systoilc function  . Nm cardiolite ltd  12/2013    Non-gated for Afib; No Ischemia or Infarction.  Marland Kitchen Anterior cervical decomp/discectomy fusion N/A 01/23/2014    Procedure: ANTERIOR CERVICAL DECOMPRESSION/DISCECTOMY FUSION CERVICAL FOUR-FIVE,CERVICAL FIVE-SIX,CERVICAL SIX-SEVEN;  Surgeon: Mariam Dollar, MD;  Location: [facility] NEURO ORS;  Service: Neurosurgery;  Laterality: N/A;   Family History  Problem Relation Age of Onset  . Heart attack Mother   . Stroke Father   . Heart disease Brother 43   Social History:  reports that she quit smoking about 16 years ago. Her smoking use included Cigarettes. She smoked 0.50 packs per day. She has never used smokeless tobacco. She reports that she does not drink alcohol or use illicit drugs.  Allergies: No Known Allergies  Medications Prior to Admission  Medication Sig Dispense Refill  . amiodarone (PACERONE) 200 MG tablet TAKE 1 TABLET (200  MG TOTAL) BY MOUTH DAILY AS DIRECTED. (Patient taking differently: TAKE 1 TABLET (200 MG TOTAL) BY MOUTH DAILY AT BEDTIME) 30 tablet 11  . Cholecalciferol (VITAMIN D) 2000 UNITS CAPS Take 2,000 Units by mouth daily.    . CRESTOR 5 MG tablet TAKE 1 TABLET BY MOUTH IN THE EVENING 30 tablet 8  . diazepam (VALIUM) 2 MG tablet Take 1 tablet (2 mg total) by mouth every 8 (eight) hours as needed for muscle spasms. 30 tablet 0  . LANOXIN 62.5 MCG TABS TAKE 1/2 TABLET (31.25MCG) BY MOUTH DAILY (Patient taking differently: TAKE 1/2 TABLET (31.25MCG) BY MOUTH EVERY OTHER DAY) 15 tablet 10  . levothyroxine (SYNTHROID, LEVOTHROID) 75 MCG tablet Take 75 mcg by mouth  daily before breakfast.    . metoprolol (LOPRESSOR) 50 MG tablet Take 0.5 tablets (25 mg total) by mouth 2 (two) times daily. 60 tablet 6  . Rivaroxaban (XARELTO) 15 MG TABS tablet Take 1 tablet by mouth daily with a meal.  Do not take any more Pradaxa 30 tablet 5  . docusate sodium (COLACE) 100 MG capsule Take 1 capsule (100 mg total) by mouth every 12 (twelve) hours. (Patient not taking: Reported on 10/22/2014) 60 capsule 0  . gabapentin (NEURONTIN) 300 MG capsule Take 300 mg by mouth at bedtime.    Marland Kitchen PRADAXA 75 MG CAPS capsule TAKE 1 CAPSULE (75 MG TOTAL) BY MOUTH 2 (TWO) TIMES DAILY. (Patient not taking: Reported on 10/22/2014) 60 capsule 5   Review of Systems  Constitutional: Negative.   HENT: Negative.   Eyes: Negative.   Respiratory: Negative.   Cardiovascular: Negative.   Gastrointestinal: Negative.   Musculoskeletal: Positive for back pain and joint pain.  Skin: Negative.   Neurological: Negative.   Endo/Heme/Allergies: Negative.   Psychiatric/Behavioral: Negative.    Blood pressure 149/88, pulse 89, temperature 97.6 F (36.4 C), temperature source Oral, resp. rate 14, height  (1.499 m), weight 37.649 kg (83 lb), SpO2 100 %. Physical Exam  Constitutional: She is oriented to person, place, and time. She appears well-developed and well-nourished.  HENT:  Head: Normocephalic and atraumatic.  Eyes: Conjunctivae and EOM are normal. Pupils are equal, round, and reactive to light.  Neck: Normal range of motion. Neck supple.  Cardiovascular: Normal rate.  An irregularly irregular rhythm present.  Respiratory: Effort normal and breath sounds normal.  GI: Soft. Bowel sounds are normal.  Musculoskeletal: Normal range of motion.  Neurological: She is alert and oriented to person, place, and time.  Skin: Skin is warm and dry.  Psychiatric: She has a normal mood and affect. Her behavior is normal. Judgment and thought content normal.    Assessment/Plan Colorectal cancer screening;  proceed with a colonoscopy at this time.  [name] 11/12/2014, 7:22 AM

## 2014-11-12 NOTE — Op Note (Signed)
[facility] [address] [place], [address]   OPERATIVE PROCEDURE REPORT  PATIENT: [name], [name]  MR#: [id number] BIRTHDATE: 18-Jun-1940 GENDER: female ENDOSCOPIST: Lorenza Burton, MD ASSISTANT:   Olene Craven, technician & Luz Lex, RN. PROCEDURE DATE: 11-27-2014 PRE-PROCEDURE PREPARATION: Patient fasted for 4 hours prior to procedure. The patient was prepped with a gallon of Golytely the night prior to the procedure.  The patient has fasted for 4 hours prior to the procedure PRE-PROCEDURE PHYSICAL: Patient has stable vital signs.  Neck is supple. There is no JVD, thyromegaly or LAD.  Chest clear to auscultation.  S1 and S2 regular.  Abdomen soft, non-distended, non-tender with NABS. PROCEDURE:     Colonoscopy, diagnostic ASA CLASS:     Class III INDICATIONS:     1.  Change in bowel habits  2.  Abnormal weight loss 3.  CRC screening. MEDICATIONS:     Monitored anesthesia care  DESCRIPTION OF PROCEDURE: After the risks, benefits, and alternatives of the procedure were thoroughly explained [including a 10% missed rate of cancer and polyps], informed consent was obtained.  Digital rectal exam was performed. The Pentax video colonoscope  [id number]  was introduced through the anus  and advanced to the cecum, which was identified by both the appendix and ileocecal valve , limited by No adverse events experienced. The quality of the prep was poor. Multiple washes were done. Small lesions could be missed. The instrument was then slowly withdrawn as the colon was fully examined. Estimated blood loss is zero unless otherwise noted in this procedure report.     COLON FINDINGS: A normal appearing cecum, ileocecal valve, and appendiceal orifice were identified.  The ascending, transverse, descending, sigmoid colon, and rectum appeared unremarkable. The entire colonic mucosa appeared healthy with a normal vascular pattern. No masses, polyps, diverticula or  AVMs were noted.  The appendiceal orifice and the ICV were identified and photographed. Retroflexed views revealed no abnormalities. The patient tolerated the procedure without immediate complications.  The scope was then withdrawn from the patient and the procedure terminated.  TIME TO CECUM:   04 minutes 00 seconds WITHDRAW TIME:  06 minutes 00 seconds  IMPRESSION:     Normal colonoscopy upto the cecum-there was a lot of residual stool in the right colon; multiple washes were done; small lesions could be missed.  RECOMMENDATIONS:     1.  Continue current medications. 2.  High fiber diet with liberal fluid intake. 3.  OP follow-up is advised on a PRN basis. 4. Follow up with PCP for further workup.  REPEAT EXAM:      no recall due to advanced age and co-morbidities If the patient has any abnormal GI symptoms in the interim, she have been advised to contact the office as soon as possible for further recommendations.   REFERRED BY: Haze Rushing, NP. eSigned:  Lorenza Burton, MD Nov 27, 2014 8:10 AM  CPT CODES:     [phone] Colonoscopy, flexible, proximal to splenic flexure; diagnostic, with or without collection of specimen(s) by brushing or washing, with or without colon decompression (separate procedure) ICD CODES:     R63.4 Abnormal weight loss R19.4 Change in bowel habits  Z12.11 Encounter for screening for malignant neoplasm of colon  The ICD and CPT codes recommended by this software are interpretations from the data that the clinical staff has captured with the software.  The verification of the translation of this report to the ICD and CPT codes and modifiers is the sole  responsibility of the health care institution and practicing physician where this report was generated.  [employer] will not be held responsible for the validity of the ICD and CPT codes included on this report.  AMA assumes no liability for data contained or not contained herein. CPT is  a Publishing rights manager of the [employer].  PATIENT NAME:  [name], [name] MR#: [id number]

## 2014-11-12 NOTE — Anesthesia Postprocedure Evaluation (Signed)
  Anesthesia Post-op Note  Patient: [name]  Procedure(s) Performed: Procedure(s) (LRB): COLONOSCOPY WITH PROPOFOL (N/A)  Patient Location: PACU  Anesthesia Type: MAC  Level of Consciousness: awake and alert   Airway and Oxygen Therapy: Patient Spontanous Breathing  Post-op Pain: mild  Post-op Assessment: Post-op Vital signs reviewed, Patient's Cardiovascular Status Stable, Respiratory Function Stable, Patent Airway and No signs of Nausea or vomiting  Last Vitals:  Filed Vitals:   11/12/14 0820  BP: 157/79  Pulse: 90  Temp:   Resp: 18    Post-op Vital Signs: stable   Complications: No apparent anesthesia complications

## 2014-11-12 NOTE — Transfer of Care (Signed)
Immediate Anesthesia Transfer of Care Note  Patient: [name]  Procedure(s) Performed: Procedure(s): COLONOSCOPY WITH PROPOFOL (N/A)  Patient Location: PACU and Endoscopy Unit  Anesthesia Type:MAC  Level of Consciousness: awake, alert , oriented and patient cooperative  Airway & Oxygen Therapy: Patient Spontanous Breathing and Patient connected to face mask oxygen  Post-op Assessment: Report given to RN, Post -op Vital signs reviewed and stable and Patient moving all extremities  Post vital signs: Reviewed and stable  Last Vitals:  Filed Vitals:   11/12/14 0710  BP: 149/88  Pulse: 89  Temp: 36.4 C  Resp: 14    Complications: No apparent anesthesia complications

## 2014-11-13 ENCOUNTER — Encounter (HOSPITAL_COMMUNITY): Payer: Self-pay | Admitting: Gastroenterology

## 2014-11-17 ENCOUNTER — Encounter (HOSPITAL_COMMUNITY): Payer: Self-pay | Admitting: *Deleted

## 2014-11-17 DIAGNOSIS — I63419 Cerebral infarction due to embolism of unspecified middle cerebral artery: Secondary | ICD-10-CM | POA: Diagnosis not present

## 2014-11-17 DIAGNOSIS — R634 Abnormal weight loss: Secondary | ICD-10-CM | POA: Diagnosis present

## 2014-11-17 DIAGNOSIS — E039 Hypothyroidism, unspecified: Secondary | ICD-10-CM | POA: Diagnosis present

## 2014-11-17 DIAGNOSIS — I129 Hypertensive chronic kidney disease with stage 1 through stage 4 chronic kidney disease, or unspecified chronic kidney disease: Secondary | ICD-10-CM | POA: Diagnosis present

## 2014-11-17 DIAGNOSIS — F419 Anxiety disorder, unspecified: Secondary | ICD-10-CM | POA: Diagnosis present

## 2014-11-17 DIAGNOSIS — Z981 Arthrodesis status: Secondary | ICD-10-CM

## 2014-11-17 DIAGNOSIS — E785 Hyperlipidemia, unspecified: Secondary | ICD-10-CM | POA: Diagnosis present

## 2014-11-17 DIAGNOSIS — Z681 Body mass index (BMI) 19 or less, adult: Secondary | ICD-10-CM

## 2014-11-17 DIAGNOSIS — I482 Chronic atrial fibrillation: Secondary | ICD-10-CM | POA: Diagnosis present

## 2014-11-17 DIAGNOSIS — G8929 Other chronic pain: Secondary | ICD-10-CM | POA: Diagnosis present

## 2014-11-17 DIAGNOSIS — I255 Ischemic cardiomyopathy: Secondary | ICD-10-CM | POA: Diagnosis present

## 2014-11-17 DIAGNOSIS — I513 Intracardiac thrombosis, not elsewhere classified: Secondary | ICD-10-CM | POA: Diagnosis present

## 2014-11-17 DIAGNOSIS — I2510 Atherosclerotic heart disease of native coronary artery without angina pectoris: Secondary | ICD-10-CM | POA: Diagnosis present

## 2014-11-17 DIAGNOSIS — R4701 Aphasia: Secondary | ICD-10-CM | POA: Diagnosis present

## 2014-11-17 DIAGNOSIS — N183 Chronic kidney disease, stage 3 (moderate): Secondary | ICD-10-CM | POA: Diagnosis present

## 2014-11-17 DIAGNOSIS — J449 Chronic obstructive pulmonary disease, unspecified: Secondary | ICD-10-CM | POA: Diagnosis present

## 2014-11-17 DIAGNOSIS — Z8249 Family history of ischemic heart disease and other diseases of the circulatory system: Secondary | ICD-10-CM

## 2014-11-17 DIAGNOSIS — I739 Peripheral vascular disease, unspecified: Secondary | ICD-10-CM | POA: Diagnosis present

## 2014-11-17 DIAGNOSIS — I639 Cerebral infarction, unspecified: Secondary | ICD-10-CM | POA: Diagnosis not present

## 2014-11-17 DIAGNOSIS — M545 Low back pain: Secondary | ICD-10-CM | POA: Diagnosis present

## 2014-11-17 DIAGNOSIS — Z7901 Long term (current) use of anticoagulants: Secondary | ICD-10-CM

## 2014-11-17 DIAGNOSIS — Z951 Presence of aortocoronary bypass graft: Secondary | ICD-10-CM

## 2014-11-17 DIAGNOSIS — Z87891 Personal history of nicotine dependence: Secondary | ICD-10-CM

## 2014-11-17 DIAGNOSIS — Z79899 Other long term (current) drug therapy: Secondary | ICD-10-CM

## 2014-11-17 LAB — CBC
HEMATOCRIT: 41 % (ref 36.0–46.0)
HEMOGLOBIN: 12.2 g/dL (ref 12.0–15.0)
MCH: 22.4 pg — AB (ref 26.0–34.0)
MCHC: 29.8 g/dL — AB (ref 30.0–36.0)
MCV: 75.4 fL — ABNORMAL LOW (ref 78.0–100.0)
Platelets: 176 10*3/uL (ref 150–400)
RBC: 5.44 MIL/uL — ABNORMAL HIGH (ref 3.87–5.11)
RDW: 16.8 % — ABNORMAL HIGH (ref 11.5–15.5)
WBC: 5.4 10*3/uL (ref 4.0–10.5)

## 2014-11-17 LAB — CBG MONITORING, ED: GLUCOSE-CAPILLARY: 114 mg/dL — AB (ref 65–99)

## 2014-11-17 LAB — DIFFERENTIAL
Basophils Absolute: 0 10*3/uL (ref 0.0–0.1)
Basophils Relative: 1 % (ref 0–1)
EOS ABS: 0.1 10*3/uL (ref 0.0–0.7)
EOS PCT: 2 % (ref 0–5)
LYMPHS ABS: 0.8 10*3/uL (ref 0.7–4.0)
Lymphocytes Relative: 16 % (ref 12–46)
MONO ABS: 0.5 10*3/uL (ref 0.1–1.0)
MONOS PCT: 9 % (ref 3–12)
Neutro Abs: 4 10*3/uL (ref 1.7–7.7)
Neutrophils Relative %: 74 % (ref 43–77)

## 2014-11-17 LAB — I-STAT CHEM 8, ED
BUN: 25 mg/dL — AB (ref 6–20)
CALCIUM ION: 1.15 mmol/L (ref 1.13–1.30)
Chloride: 99 mmol/L — ABNORMAL LOW (ref 101–111)
Creatinine, Ser: 1.2 mg/dL — ABNORMAL HIGH (ref 0.44–1.00)
Glucose, Bld: 111 mg/dL — ABNORMAL HIGH (ref 65–99)
HCT: 43 % (ref 36.0–46.0)
Hemoglobin: 14.6 g/dL (ref 12.0–15.0)
Potassium: 4.5 mmol/L (ref 3.5–5.1)
SODIUM: 139 mmol/L (ref 135–145)
TCO2: 29 mmol/L (ref 0–100)

## 2014-11-17 LAB — PROTIME-INR
INR: 1.87 — ABNORMAL HIGH (ref 0.00–1.49)
Prothrombin Time: 21.4 seconds — ABNORMAL HIGH (ref 11.6–15.2)

## 2014-11-17 LAB — I-STAT TROPONIN, ED: TROPONIN I, POC: 0 ng/mL (ref 0.00–0.08)

## 2014-11-17 LAB — APTT: aPTT: 46 seconds — ABNORMAL HIGH (ref 24–37)

## 2014-11-17 NOTE — ED Notes (Signed)
Pt having difficulty speaking, voice sounds mumbled. Spoke with Dr Rhunette Croft regarding pt. Orders placed.

## 2014-11-17 NOTE — ED Notes (Signed)
CBG 114 

## 2014-11-17 NOTE — ED Notes (Signed)
Pts daughter states that pt is acting very different from her baseline. Pt is not responding to verbal stimuli. Not following all commands. Has facial droop. LSN 6p.

## 2014-11-18 ENCOUNTER — Emergency Department (HOSPITAL_COMMUNITY): Payer: Medicare HMO

## 2014-11-18 ENCOUNTER — Inpatient Hospital Stay (HOSPITAL_COMMUNITY): Payer: Medicare HMO

## 2014-11-18 ENCOUNTER — Inpatient Hospital Stay (HOSPITAL_COMMUNITY)
Admission: EM | Admit: 2014-11-18 | Discharge: 2014-11-19 | DRG: 065 | Disposition: A | Discharge status: Home Health | Payer: Medicare HMO | Attending: Internal Medicine | Admitting: Internal Medicine

## 2014-11-18 ENCOUNTER — Encounter (HOSPITAL_COMMUNITY): Payer: Self-pay

## 2014-11-18 DIAGNOSIS — Z8249 Family history of ischemic heart disease and other diseases of the circulatory system: Secondary | ICD-10-CM | POA: Diagnosis not present

## 2014-11-18 DIAGNOSIS — E785 Hyperlipidemia, unspecified: Secondary | ICD-10-CM | POA: Diagnosis present

## 2014-11-18 DIAGNOSIS — I63412 Cerebral infarction due to embolism of left middle cerebral artery: Secondary | ICD-10-CM

## 2014-11-18 DIAGNOSIS — I2510 Atherosclerotic heart disease of native coronary artery without angina pectoris: Secondary | ICD-10-CM | POA: Diagnosis present

## 2014-11-18 DIAGNOSIS — I639 Cerebral infarction, unspecified: Secondary | ICD-10-CM | POA: Diagnosis present

## 2014-11-18 DIAGNOSIS — I739 Peripheral vascular disease, unspecified: Secondary | ICD-10-CM | POA: Diagnosis present

## 2014-11-18 DIAGNOSIS — Z79899 Other long term (current) drug therapy: Secondary | ICD-10-CM | POA: Diagnosis not present

## 2014-11-18 DIAGNOSIS — F419 Anxiety disorder, unspecified: Secondary | ICD-10-CM | POA: Diagnosis present

## 2014-11-18 DIAGNOSIS — I513 Intracardiac thrombosis, not elsewhere classified: Secondary | ICD-10-CM | POA: Diagnosis present

## 2014-11-18 DIAGNOSIS — I63419 Cerebral infarction due to embolism of unspecified middle cerebral artery: Secondary | ICD-10-CM | POA: Diagnosis present

## 2014-11-18 DIAGNOSIS — I482 Chronic atrial fibrillation: Secondary | ICD-10-CM | POA: Diagnosis present

## 2014-11-18 DIAGNOSIS — I6789 Other cerebrovascular disease: Secondary | ICD-10-CM | POA: Diagnosis not present

## 2014-11-18 DIAGNOSIS — M545 Low back pain: Secondary | ICD-10-CM | POA: Diagnosis present

## 2014-11-18 DIAGNOSIS — Z951 Presence of aortocoronary bypass graft: Secondary | ICD-10-CM | POA: Diagnosis not present

## 2014-11-18 DIAGNOSIS — I255 Ischemic cardiomyopathy: Secondary | ICD-10-CM | POA: Diagnosis present

## 2014-11-18 DIAGNOSIS — J449 Chronic obstructive pulmonary disease, unspecified: Secondary | ICD-10-CM | POA: Diagnosis present

## 2014-11-18 DIAGNOSIS — E039 Hypothyroidism, unspecified: Secondary | ICD-10-CM | POA: Diagnosis present

## 2014-11-18 DIAGNOSIS — Z87891 Personal history of nicotine dependence: Secondary | ICD-10-CM | POA: Diagnosis not present

## 2014-11-18 DIAGNOSIS — I10 Essential (primary) hypertension: Secondary | ICD-10-CM | POA: Diagnosis present

## 2014-11-18 DIAGNOSIS — R634 Abnormal weight loss: Secondary | ICD-10-CM | POA: Insufficient documentation

## 2014-11-18 DIAGNOSIS — N183 Chronic kidney disease, stage 3 unspecified: Secondary | ICD-10-CM | POA: Diagnosis present

## 2014-11-18 DIAGNOSIS — I4821 Permanent atrial fibrillation: Secondary | ICD-10-CM | POA: Diagnosis present

## 2014-11-18 DIAGNOSIS — Z981 Arthrodesis status: Secondary | ICD-10-CM | POA: Diagnosis not present

## 2014-11-18 DIAGNOSIS — R4701 Aphasia: Secondary | ICD-10-CM | POA: Diagnosis present

## 2014-11-18 DIAGNOSIS — G8929 Other chronic pain: Secondary | ICD-10-CM | POA: Diagnosis present

## 2014-11-18 DIAGNOSIS — I129 Hypertensive chronic kidney disease with stage 1 through stage 4 chronic kidney disease, or unspecified chronic kidney disease: Secondary | ICD-10-CM | POA: Diagnosis present

## 2014-11-18 DIAGNOSIS — Z7901 Long term (current) use of anticoagulants: Secondary | ICD-10-CM | POA: Diagnosis not present

## 2014-11-18 DIAGNOSIS — Z681 Body mass index (BMI) 19 or less, adult: Secondary | ICD-10-CM | POA: Diagnosis not present

## 2014-11-18 DIAGNOSIS — Z8673 Personal history of transient ischemic attack (TIA), and cerebral infarction without residual deficits: Secondary | ICD-10-CM

## 2014-11-18 HISTORY — DX: Personal history of transient ischemic attack (TIA), and cerebral infarction without residual deficits: Z86.73

## 2014-11-18 LAB — COMPREHENSIVE METABOLIC PANEL
ALT: 35 U/L (ref 14–54)
ALT: 34 U/L (ref 14–54)
ANION GAP: 9 (ref 5–15)
AST: 42 U/L — ABNORMAL HIGH (ref 15–41)
AST: 41 U/L (ref 15–41)
Albumin: 4.4 g/dL (ref 3.5–5.0)
Albumin: 4.4 g/dL (ref 3.5–5.0)
Alkaline Phosphatase: 66 U/L (ref 38–126)
Alkaline Phosphatase: 63 U/L (ref 38–126)
Anion gap: 11 (ref 5–15)
BILIRUBIN TOTAL: 0.5 mg/dL (ref 0.3–1.2)
BILIRUBIN TOTAL: 0.8 mg/dL (ref 0.3–1.2)
BUN: 16 mg/dL (ref 6–20)
BUN: 17 mg/dL (ref 6–20)
CO2: 29 mmol/L (ref 22–32)
CO2: 28 mmol/L (ref 22–32)
CREATININE: 0.98 mg/dL (ref 0.44–1.00)
Calcium: 9.4 mg/dL (ref 8.9–10.3)
Calcium: 9.3 mg/dL (ref 8.9–10.3)
Chloride: 100 mmol/L — ABNORMAL LOW (ref 101–111)
Chloride: 102 mmol/L (ref 101–111)
Creatinine, Ser: 1.14 mg/dL — ABNORMAL HIGH (ref 0.44–1.00)
GFR calc Af Amer: 53 mL/min — ABNORMAL LOW (ref >60)
GFR, EST NON AFRICAN AMERICAN: 46 mL/min — AB (ref >60)
GFR, EST NON AFRICAN AMERICAN: 55 mL/min — AB (ref >60)
Glucose, Bld: 115 mg/dL — ABNORMAL HIGH (ref 65–99)
Glucose, Bld: 147 mg/dL — ABNORMAL HIGH (ref 65–99)
POTASSIUM: 4.3 mmol/L (ref 3.5–5.1)
POTASSIUM: 4.7 mmol/L (ref 3.5–5.1)
Sodium: 138 mmol/L (ref 135–145)
Sodium: 141 mmol/L (ref 135–145)
TOTAL PROTEIN: 7.2 g/dL (ref 6.5–8.1)
TOTAL PROTEIN: 6.9 g/dL (ref 6.5–8.1)

## 2014-11-18 LAB — CBC
HCT: 41.1 % (ref 36.0–46.0)
Hemoglobin: 12.2 g/dL (ref 12.0–15.0)
MCH: 22.2 pg — ABNORMAL LOW (ref 26.0–34.0)
MCHC: 29.7 g/dL — ABNORMAL LOW (ref 30.0–36.0)
MCV: 74.9 fL — ABNORMAL LOW (ref 78.0–100.0)
PLATELETS: 198 10*3/uL (ref 150–400)
RBC: 5.49 MIL/uL — AB (ref 3.87–5.11)
RDW: 16.5 % — AB (ref 11.5–15.5)
WBC: 7.5 10*3/uL (ref 4.0–10.5)

## 2014-11-18 LAB — GLUCOSE, CAPILLARY
GLUCOSE-CAPILLARY: 108 mg/dL — AB (ref 65–99)
GLUCOSE-CAPILLARY: 93 mg/dL (ref 65–99)
Glucose-Capillary: 144 mg/dL — ABNORMAL HIGH (ref 65–99)

## 2014-11-18 LAB — LIPID PANEL
CHOLESTEROL: 170 mg/dL (ref 0–200)
HDL: 78 mg/dL (ref >40)
LDL CALC: 81 mg/dL (ref 0–99)
TRIGLYCERIDES: 57 mg/dL (ref <150)
Total CHOL/HDL Ratio: 2.2 RATIO
VLDL: 11 mg/dL (ref 0–40)

## 2014-11-18 LAB — ETHANOL

## 2014-11-18 LAB — DIGOXIN LEVEL

## 2014-11-18 LAB — CBG MONITORING, ED: Glucose-Capillary: 110 mg/dL — ABNORMAL HIGH (ref 65–99)

## 2014-11-18 MED ORDER — METOPROLOL TARTRATE 1 MG/ML IV SOLN
5.0000 mg | Freq: Four times a day (QID) | INTRAVENOUS | Status: DC
  Administered 2014-11-18: 5 mg via INTRAVENOUS
  Filled 2014-11-18: qty 5

## 2014-11-18 MED ORDER — AMIODARONE HCL 200 MG PO TABS
200.0000 mg | ORAL_TABLET | Freq: Every day | ORAL | Status: DC
  Administered 2014-11-18: 200 mg via ORAL
  Filled 2014-11-18: qty 1

## 2014-11-18 MED ORDER — SENNOSIDES-DOCUSATE SODIUM 8.6-50 MG PO TABS
1.0000 | ORAL_TABLET | Freq: Every evening | ORAL | Status: DC | PRN
Start: 2014-11-18 — End: 2014-11-19

## 2014-11-18 MED ORDER — ENSURE ENLIVE PO LIQD
237.0000 mL | Freq: Two times a day (BID) | ORAL | Status: DC
  Administered 2014-11-19 (×2): 237 mL via ORAL
  Filled 2014-11-18: qty 237

## 2014-11-18 MED ORDER — LEVOTHYROXINE SODIUM 75 MCG PO TABS
75.0000 ug | ORAL_TABLET | Freq: Every day | ORAL | Status: DC
  Administered 2014-11-19: 75 ug via ORAL
  Filled 2014-11-18: qty 3
  Filled 2014-11-18 (×2): qty 1

## 2014-11-18 MED ORDER — METOPROLOL TARTRATE 25 MG PO TABS
25.0000 mg | ORAL_TABLET | Freq: Two times a day (BID) | ORAL | Status: DC
  Administered 2014-11-18 – 2014-11-19 (×2): 25 mg via ORAL
  Filled 2014-11-18 (×5): qty 1

## 2014-11-18 MED ORDER — ASPIRIN 325 MG PO TABS: 325.0000 mg | ORAL_TABLET | Freq: Every day | ORAL | Status: DC

## 2014-11-18 MED ORDER — GABAPENTIN 300 MG PO CAPS
300.0000 mg | ORAL_CAPSULE | Freq: Every day | ORAL | Status: DC
  Administered 2014-11-18: 300 mg via ORAL
  Filled 2014-11-18 (×2): qty 1

## 2014-11-18 MED ORDER — IOHEXOL 300 MG/ML  SOLN
25.0000 mL | INTRAMUSCULAR | Status: AC
  Administered 2014-11-18: 25 mL via ORAL

## 2014-11-18 MED ORDER — IOHEXOL 300 MG/ML  SOLN
80.0000 mL | Freq: Once | INTRAMUSCULAR | Status: AC | PRN
  Administered 2014-11-18: 100 mL via INTRAVENOUS

## 2014-11-18 MED ORDER — ASPIRIN EC 81 MG PO TBEC: 81.0000 mg | DELAYED_RELEASE_TABLET | Freq: Every day | ORAL | Status: DC

## 2014-11-18 MED ORDER — ASPIRIN 300 MG RE SUPP
300.0000 mg | Freq: Every day | RECTAL | Status: DC
  Administered 2014-11-18: 300 mg via RECTAL
  Filled 2014-11-18: qty 1

## 2014-11-18 MED ORDER — SODIUM CHLORIDE 0.9 % IV SOLN: INTRAVENOUS | Status: DC

## 2014-11-18 MED ORDER — ACETAMINOPHEN 325 MG PO TABS
650.0000 mg | ORAL_TABLET | Freq: Four times a day (QID) | ORAL | Status: DC | PRN
  Administered 2014-11-18: 650 mg via ORAL
  Filled 2014-11-18: qty 2

## 2014-11-18 MED ORDER — DIGOXIN 0.05 MG/ML PO SOLN
0.0313 mg | Freq: Every day | ORAL | Status: DC
  Administered 2014-11-18 – 2014-11-19 (×2): 0.0313 mg via ORAL
  Filled 2014-11-18 (×3): qty 2.5

## 2014-11-18 MED ORDER — DIGOXIN 62.5 MCG PO TABS: 31.2500 ug | ORAL_TABLET | Freq: Every morning | ORAL | Status: DC

## 2014-11-18 MED ORDER — APIXABAN 5 MG PO TABS
5.0000 mg | ORAL_TABLET | Freq: Two times a day (BID) | ORAL | Status: DC
  Administered 2014-11-18 – 2014-11-19 (×2): 5 mg via ORAL
  Filled 2014-11-18 (×3): qty 1

## 2014-11-18 MED ORDER — STROKE: EARLY STAGES OF RECOVERY BOOK
Freq: Once | Status: AC
  Administered 2014-11-18: 1

## 2014-11-18 MED ORDER — DIAZEPAM 2 MG PO TABS
2.0000 mg | ORAL_TABLET | Freq: Three times a day (TID) | ORAL | Status: DC | PRN
  Administered 2014-11-18 – 2014-11-19 (×2): 2 mg via ORAL
  Filled 2014-11-18 (×2): qty 1

## 2014-11-18 MED ORDER — MORPHINE SULFATE (PF) 2 MG/ML IV SOLN
0.5000 mg | Freq: Once | INTRAVENOUS | Status: AC
  Administered 2014-11-18: 0.5 mg via INTRAVENOUS
  Filled 2014-11-18: qty 1

## 2014-11-18 MED ORDER — ROSUVASTATIN CALCIUM 5 MG PO TABS
5.0000 mg | ORAL_TABLET | Freq: Every evening | ORAL | Status: DC
  Administered 2014-11-18: 5 mg via ORAL
  Filled 2014-11-18 (×2): qty 1

## 2014-11-18 NOTE — Evaluation (Signed)
Clinical/Bedside Swallow Evaluation Patient Details  Name: [name] MRN: [id number] Date of Birth: [birth year]  Today's Date: 11/18/2014 Time: SLP Start Time (ACUTE ONLY): 1000 SLP Stop Time (ACUTE ONLY): 1025 SLP Time Calculation (min) (ACUTE ONLY): 25 min  Past Medical History:  Past Medical History  Diagnosis Date  . S/P CABG x 2 2009    LIMA-LAD, SVG-RCA, with AV fistula ligation and Maze procedure  . CAD (coronary artery disease), native coronary artery 2009    75% LAD, diffuse 75-90% RCA --> Referred for CABG + MAZE;  Cardiolite 12/2013: No ischemia or Infarction  . Ischemic cardiomyopathy 2012    EF ~40-45% by Echo  . PAF (paroxysmal atrial fibrillation) 04/05/2012    s/p MAZE -- recurrence, cardioversion-converted to sinus bradycardia --> Now persistent despite being on Amiodarone & Pradaxa  . Essential hypertension   . Dyslipidemia, goal LDL below 70     On Crestor, followed by PCP  . COPD (chronic obstructive pulmonary disease)   . PAD (peripheral artery disease) 2007    s/p L Ileac A stent ; most recent Dopplers July 2012: Less than 50% reduction bilaterally. ABI 0.96 on the right 0.88 on left;;;12/27/2011   -ABI right .87 and left ABI .78  ,LEFT CIA and EIA stent normall patency, left CFA,SFA,and popliteal 0-49%; rgt proximal SFA 50-69%,rgt CIA,EIA, and CFA 0-49%  . Chronic low back pain     scoliosis & lordosis  . H/O: pneumonia   . Hypothyroidism   . Anxiety   . Cervical neck pain with evidence of disc disease     with need for surgery -- November 2015  . Osteoarthritis of back     And neck, hands,spine   Past Surgical History:  Past Surgical History  Procedure Laterality Date  . Iliac artery stent Left 04/06/2005    Ex Iliac - CFA (Smart STENTS -- 7x4 in EIA, 6 x 3 CFA)   . Coronary artery bypass graft  2009    LIMA-LAD, SVG-RCA (also MAZE & AV fistula ligation)  . Maze  2009    along with CABG  . Cardioversion  04/05/2012    Procedure: CARDIOVERSION;  Surgeon:  Thurmon Fair, MD;  Location: [facility] ENDOSCOPY;  Service: Cardiovascular;  Laterality: N/A;  . Transthoracic echocardiogram  Jan 2012    EF ~40-45%, global HK; PAP ~45-50 mmHg  . Nm myoview ltd  May 2013    No ischemia or Infarct  . Abdominal hysterectomy    . Cardiac catheterization  07/30/2007    75% LAD ,3 stenoses of 75-90% in  RCA;circumflex from proximal RCA with no lesion seen,normal ramus intermediate branh; normal LV systoilc function  . Nm cardiolite ltd  12/2013    Non-gated for Afib; No Ischemia or Infarction.  Marland Kitchen Anterior cervical decomp/discectomy fusion N/A 01/23/2014    Procedure: ANTERIOR CERVICAL DECOMPRESSION/DISCECTOMY FUSION CERVICAL FOUR-FIVE,CERVICAL FIVE-SIX,CERVICAL SIX-SEVEN;  Surgeon: Mariam Dollar, MD;  Location: [facility] NEURO ORS;  Service: Neurosurgery;  Laterality: N/A;  . Colonoscopy with propofol N/A 11/12/2014    Procedure: COLONOSCOPY WITH PROPOFOL;  Surgeon: Charna Elizabeth, MD;  Location: [facility] ENDOSCOPY;  Service: Endoscopy;  Laterality: N/A;   HPI:  [name] is a 74 y.o. female who was admitted 11/17/14 due to being unable to talk as well as a right facial droop. CT head did not show anything acute, MRI pending.  Orders received for bedside swallow evaluation.   Assessment / Plan / Recommendation Clinical Impression  Bedside swallow evaluation complete.  Patient  demonstrates some right sided oral weakness; however, it appears that language comprehension and/or motor planning limited ability to assess.  Patient demonstrated what appeared to be timely swallow initiation and all consistencies resulted in multiple swallows, likely due to pharyngeal residue.  Regular textures also resulted in moderate oral residue that patient was unable to clear lingually.  As a result, recommend diet initiation of Dys.2 textures and thin liquids with full supervision for use of small portions and to check for pocketing.  SLP will follow acutely for toleration as well as advancement.  Need for  follow up SLP services likely upon discharge.    Aspiration Risk  Mild    Diet Recommendation Dysphagia 2 (Fine chop);Thin   Medication Administration: Whole meds with puree Compensations: Slow rate;Small sips/bites;Check for pocketing    Other  Recommendations Oral Care Recommendations: Oral care BID   Follow Up Recommendations   24/7 supervision and home health versus rehab per PT recommendations    Frequency and Duration min 2x/week  2 weeks   Pertinent Vitals/Pain None    SLP Swallow Goals  See care plan    Swallow Study Prior Functional Status   Regular and thin per daughter     General Date of Onset: 11/17/14 Other Pertinent Information: [name] is a 74 y.o. female who was admitted 11/17/14 due to being unable to talk as well as a right facial droop. CT head did not show anything acute, MRI pending.  Orders received for bedside swallow evaluation. Type of Study: Bedside swallow evaluation Previous Swallow Assessment: none on record Diet Prior to this Study: NPO Temperature Spikes Noted: No Respiratory Status: Room air History of Recent Intubation: No Behavior/Cognition: Alert;Cooperative;Requires cueing Oral Cavity - Dentition: Adequate natural dentition/normal for age [place]-Feeding Abilities: Needs assist;Needs set up Patient Positioning: Upright in bed Baseline Vocal Quality: Low vocal intensity Volitional Cough: Weak Volitional Swallow: Able to elicit    Oral/Motor/Sensory Function Overall Oral Motor/Sensory Function: Impaired Labial ROM: Reduced right Labial Symmetry: Abnormal symmetry right Labial Strength: Reduced Lingual ROM: Reduced right Lingual Symmetry: Abnormal symmetry right Lingual Strength: Reduced Velum: Within Functional Limits Mandible: Within Functional Limits   Ice Chips Ice chips: Within functional limits Presentation: Spoon   Thin Liquid Thin Liquid: Impaired Presentation: Cup;Straw Oral Phase Impairments: Reduced labial  seal Oral Phase Functional Implications: Left anterior spillage Pharyngeal  Phase Impairments: Multiple swallows;Throat Clearing - Delayed Other Comments: subtle s/s greater with straw    Nectar Thick Nectar Thick Liquid: Not tested   Honey Thick Honey Thick Liquid: Not tested   Puree Puree: Within functional limits Presentation: Zylstra Fed Pharyngeal Phase Impairments: Multiple swallows   Solid   GO    Solid: Impaired Oral Phase Impairments: Reduced lingual movement/coordination;Impaired anterior to posterior transit;Impaired mastication Oral Phase Functional Implications: Oral residue;Left lateral sulci pocketing;Right lateral sulci pocketing Pharyngeal Phase Impairments: Multiple swallows      Fae Pippin, M.A., CCC-SLP [phone]  Andre Gallego 11/18/2014,11:21 AM

## 2014-11-18 NOTE — Consult Note (Signed)
Referring Physician: Dr Dina Rich    Chief Complaint: aphasia, right face weakness  HPI:                                                                                                                                         [name] is an 74 y.o. female with a past medical history that is relevant for HTN, hyperlipidemia, CAD s.p CABG, ischemic cardiomyopathy, PAF on xarelto, and PAD, brought in by EMS due to acute onset of the aforementioned symptoms. As per grandson who stays with patient the whole day, she was reportedly well yesterday around 6 pm but when her daughter returned from work at 8:15 found her " not being herself, unable to talk". EMS was summoned and at the moment of initial encounter with ED attending she was globally aphasic with right face weakness. Patient started improving after returning from the CT suite and was able to follow commands and speak short sentences but couldn't name objects. NIHSS 4. CT brain was personally reviewed and showed no acute abnormality. Daughter said that she was in this ED two weeks ago with complains of left hand weakness and she was switched from pradaxa to xarelto which she took yesterday morning. Date last known well: 11/17/14 Time last known well: 6 pm tPA Given: no, on xarelto and out of the window NIHSS: 4 MRS: 0  Past Medical History  Diagnosis Date  . S/P CABG x 2 2009    LIMA-LAD, SVG-RCA, with AV fistula ligation and Maze procedure  . CAD (coronary artery disease), native coronary artery 2009    75% LAD, diffuse 75-90% RCA --> Referred for CABG + MAZE;  Cardiolite 12/2013: No ischemia or Infarction  . Ischemic cardiomyopathy 2012    EF ~40-45% by Echo  . PAF (paroxysmal atrial fibrillation) 04/05/2012    s/p MAZE -- recurrence, cardioversion-converted to sinus bradycardia --> Now persistent despite being on Amiodarone & Pradaxa  . Essential hypertension   . Dyslipidemia, goal LDL below 70     On Crestor, followed by PCP  . COPD  (chronic obstructive pulmonary disease)   . PAD (peripheral artery disease) 2007    s/p L Ileac A stent ; most recent Dopplers July 2012: Less than 50% reduction bilaterally. ABI 0.96 on the right 0.88 on left;;;12/27/2011   -ABI right .87 and left ABI .78  ,LEFT CIA and EIA stent normall patency, left CFA,SFA,and popliteal 0-49%; rgt proximal SFA 50-69%,rgt CIA,EIA, and CFA 0-49%  . Chronic low back pain     scoliosis & lordosis  . H/O: pneumonia   . Hypothyroidism   . Anxiety   . Cervical neck pain with evidence of disc disease     with need for surgery -- November 2015  . Osteoarthritis of back     And neck, hands,spine    Past Surgical History  Procedure Laterality Date  . Iliac  artery stent Left 04/06/2005    Ex Iliac - CFA (Smart STENTS -- 7x4 in EIA, 6 x 3 CFA)   . Coronary artery bypass graft  2009    LIMA-LAD, SVG-RCA (also MAZE & AV fistula ligation)  . Maze  2009    along with CABG  . Cardioversion  04/05/2012    Procedure: CARDIOVERSION;  Surgeon: Sanda Klein, MD;  Location: [facility] ENDOSCOPY;  Service: Cardiovascular;  Laterality: N/A;  . Transthoracic echocardiogram  Jan 2012    EF ~40-45%, global HK; PAP ~45-50 mmHg  . Nm myoview ltd  May 2013    No ischemia or Infarct  . Abdominal hysterectomy    . Cardiac catheterization  07/30/2007    75% LAD ,3 stenoses of 75-90% in  RCA;circumflex from proximal RCA with no lesion seen,normal ramus intermediate branh; normal LV systoilc function  . Nm cardiolite ltd  12/2013    Non-gated for Afib; No Ischemia or Infarction.  Marland Kitchen Anterior cervical decomp/discectomy fusion N/A 01/23/2014    Procedure: ANTERIOR CERVICAL DECOMPRESSION/DISCECTOMY FUSION CERVICAL FOUR-FIVE,CERVICAL FIVE-SIX,CERVICAL SIX-SEVEN;  Surgeon: Elaina Hoops, MD;  Location: [place] NEURO ORS;  Service: Neurosurgery;  Laterality: N/A;  . Colonoscopy with propofol N/A 11/12/2014    Procedure: COLONOSCOPY WITH PROPOFOL;  Surgeon: Juanita Craver, MD;  Location: [facility] ENDOSCOPY;   Service: Endoscopy;  Laterality: N/A;    Family History  Problem Relation Age of Onset  . Heart attack Mother   . Stroke Father   . Heart disease Brother 21   Social History:  reports that she quit smoking about 16 years ago. Her smoking use included Cigarettes. She smoked 0.50 packs per day. She has never used smokeless tobacco. She reports that she does not drink alcohol or use illicit drugs. Family history: no MS, brain tumor, epilepsy, or brain aneurysm Allergies: No Known Allergies  Medications:                                                                                                                           I have reviewed the patient's current medications.  ROS: unable to obtain due to expressive>receptive dysphasia                                                                                                                                       History obtained from chart review and daughter   Physical  exam:  Constitutional: well developed, pleasant female in no apparent distress. Blood pressure 136/93, pulse 62, temperature 98 F (36.7 C), temperature source Oral, resp. rate 18, height $RemoveBe'4\' 10"'vMdWWLVvk$  (1.473 m), weight 40.143 kg (88 lb 8 oz), SpO2 97 %. Eyes: no jaundice or exophthalmos.  Head: normocephalic. Neck: supple, no bruits, no JVD. Cardiac: no murmurs. Lungs: clear. Abdomen: soft, no tender, no mass. Extremities: no edema, clubbing, or cyanosis.  Skin: no rash Neurologic Examination:                                                                                                      General: Mental Status: Alert and awake. Expressive>>receptive dysphasia. Naming seems to be particularly compromised. Cranial Nerves: II: Discs flat bilaterally; Visual fields grossly normal, pupils equal, round, reactive to light and accommodation III,IV, VI: ptosis not present, extra-ocular motions intact bilaterally V,VII: smile asymmetric due to mild right face weakness, facial  light touch sensation normal bilaterally VIII: hearing normal bilaterally IX,X: uvula rises symmetrically XI: bilateral shoulder shrug XII: midline tongue extension without atrophy or fasciculations  Motor: Right : Upper extremity   5/5    Left:     Upper extremity   5/5  Lower extremity   5/5     Lower extremity   5/5 Tone and bulk:normal tone throughout; no atrophy noted Sensory: Pinprick and light touch intact throughout, bilaterally Deep Tendon Reflexes:  Right: Upper Extremity   Left: Upper extremity   biceps (C-5 to C-6) 2/4   biceps (C-5 to C-6) 2/4 tricep (C7) 2/4    triceps (C7) 2/4 Brachioradialis (C6) 2/4  Brachioradialis (C6) 2/4  Lower Extremity Lower Extremity  quadriceps (L-2 to L-4) 2/4   quadriceps (L-2 to L-4) 2/4 Achilles (S1) 2/4   Achilles (S1) 2/4 Plantars: Right: downgoing   Left: downgoing Cerebellar: normal finger-to-nose,  normal heel-to-shin test Gait:  Unable to test due to multiple leads    Results for orders placed or performed during the hospital encounter of 11/18/14 (from the past 48 hour(s))  CBG monitoring, ED     Status: Abnormal   Collection Time: 11/17/14 11:13 PM  Result Value Ref Range   Glucose-Capillary 114 (H) 65 - 99 mg/dL  Protime-INR     Status: Abnormal   Collection Time: 11/17/14 11:15 PM  Result Value Ref Range   Prothrombin Time 21.4 (H) 11.6 - 15.2 seconds   INR 1.87 (H) 0.00 - 1.49  APTT     Status: Abnormal   Collection Time: 11/17/14 11:15 PM  Result Value Ref Range   aPTT 46 (H) 24 - 37 seconds    Comment:        IF BASELINE aPTT IS ELEVATED, SUGGEST PATIENT RISK ASSESSMENT BE USED TO DETERMINE APPROPRIATE ANTICOAGULANT THERAPY.   CBC     Status: Abnormal   Collection Time: 11/17/14 11:15 PM  Result Value Ref Range   WBC 5.4 4.0 - 10.5 K/uL   RBC 5.44 (H) 3.87 - 5.11 MIL/uL   Hemoglobin 12.2 12.0 - 15.0 g/dL   HCT 41.0 36.0 - 46.0 %  MCV 75.4 (L) 78.0 - 100.0 fL   MCH 22.4 (L) 26.0 - 34.0 pg   MCHC 29.8  (L) 30.0 - 36.0 g/dL   RDW 16.8 (H) 11.5 - 15.5 %   Platelets 176 150 - 400 K/uL  Differential     Status: None   Collection Time: 11/17/14 11:15 PM  Result Value Ref Range   Neutrophils Relative % 74 43 - 77 %   Neutro Abs 4.0 1.7 - 7.7 K/uL   Lymphocytes Relative 16 12 - 46 %   Lymphs Abs 0.8 0.7 - 4.0 K/uL   Monocytes Relative 9 3 - 12 %   Monocytes Absolute 0.5 0.1 - 1.0 K/uL   Eosinophils Relative 2 0 - 5 %   Eosinophils Absolute 0.1 0.0 - 0.7 K/uL   Basophils Relative 1 0 - 1 %   Basophils Absolute 0.0 0.0 - 0.1 K/uL  Comprehensive metabolic panel     Status: Abnormal   Collection Time: 11/17/14 11:15 PM  Result Value Ref Range   Sodium 138 135 - 145 mmol/L   Potassium 4.3 3.5 - 5.1 mmol/L   Chloride 100 (L) 101 - 111 mmol/L   CO2 29 22 - 32 mmol/L   Glucose, Bld 115 (H) 65 - 99 mg/dL   BUN 16 6 - 20 mg/dL   Creatinine, Ser 1.14 (H) 0.44 - 1.00 mg/dL   Calcium 9.4 8.9 - 10.3 mg/dL   Total Protein 7.2 6.5 - 8.1 g/dL   Albumin 4.4 3.5 - 5.0 g/dL   AST 42 (H) 15 - 41 U/L   ALT 35 14 - 54 U/L   Alkaline Phosphatase 66 38 - 126 U/L   Total Bilirubin 0.5 0.3 - 1.2 mg/dL   GFR calc non Af Amer 46 (L) >60 mL/min   GFR calc Af Amer 53 (L) >60 mL/min    Comment: (NOTE) The eGFR has been calculated using the CKD EPI equation. This calculation has not been validated in all clinical situations. eGFR's persistently <60 mL/min signify possible Chronic Kidney Disease.    Anion gap 9 5 - 15  I-stat troponin, ED (not at [facility], [facility])     Status: None   Collection Time: 11/17/14 11:21 PM  Result Value Ref Range   Troponin i, poc 0.00 0.00 - 0.08 ng/mL   Comment 3            Comment: Due to the release kinetics of cTnI, a negative result within the first hours of the onset of symptoms does not rule out myocardial infarction with certainty. If myocardial infarction is still suspected, repeat the test at appropriate intervals.   I-Stat Chem 8, ED  (not at [facility], [facility])     Status:  Abnormal   Collection Time: 11/17/14 11:21 PM  Result Value Ref Range   Sodium 139 135 - 145 mmol/L   Potassium 4.5 3.5 - 5.1 mmol/L   Chloride 99 (L) 101 - 111 mmol/L   BUN 25 (H) 6 - 20 mg/dL   Creatinine, Ser 1.20 (H) 0.44 - 1.00 mg/dL   Glucose, Bld 111 (H) 65 - 99 mg/dL   Calcium, Ion 1.15 1.13 - 1.30 mmol/L   TCO2 29 0 - 100 mmol/L   Hemoglobin 14.6 12.0 - 15.0 g/dL   HCT 43.0 36.0 - 46.0 %   Ct Head Wo Contrast  11/18/2014   CLINICAL DATA:  Code stroke. Left-sided facial droop. Initial encounter.  EXAM: CT HEAD WITHOUT CONTRAST  TECHNIQUE: Contiguous axial images  were obtained from the base of the skull through the vertex without intravenous contrast.  COMPARISON:  None.  FINDINGS: There is no evidence of acute infarction, mass lesion, or intra- or extra-axial hemorrhage on CT.  Scattered periventricular and subcortical white matter change likely reflects small vessel ischemic microangiopathy. A small chronic infarct is noted at the right basal ganglia.  The posterior fossa, including the cerebellum, brainstem and fourth ventricle, is within normal limits. The third and lateral ventricles are unremarkable in appearance. The cerebral hemispheres demonstrate grossly normal gray-white differentiation. No mass effect or midline shift is seen.  There is no evidence of fracture; visualized osseous structures are unremarkable in appearance. The orbits are within normal limits. The paranasal sinuses and mastoid air cells are well-aerated. No significant soft tissue abnormalities are seen.  IMPRESSION: 1. No acute intracranial pathology seen on CT. 2. Scattered small vessel ischemic microangiopathy. 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  These results were called by telephone at the time of interpretation on 11/18/2014 at 12:36 am to Dr. Armida Sans, who verbally acknowledged these results.   Electronically Signed   By: Garald Balding M.D.   On: 11/18/2014 00:38    Assessment: 74 y.o. female  with multiple risk factors for stroke including PAF on xarelto, brought in due to acute onset expressive>receptive aphasia and mild right face weakness. CT brain without acute abnormality. She did improve substantially in the ED and by the time I examined her had NIHSS 4 (aphasia and mild right face weakness). 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 Patient is out of the window for IV thrombolysis, is on xarelto, and her exam is not suggestive of proximal large artery occlusion necessitating endovascular intervention. Admit to medicine. Ordered complete stroke work up, Of note, she was recently switched from pradaxa to xarelto after sustaining a transient episode of left arm paresthesias. Consider switching to apixaban if MRI reveals no large infarction. Stroke team will follow up today.  Stroke Risk Factors - age, HTN, hyperlipidemia, CAD, ischemic cardiomyopathy, PAF  Plan: 1. HgbA1c, fasting lipid panel 2. MRI, MRA  of the brain without contrast 3. Echocardiogram 4. Carotid dopplers 5. Prophylactic therapy-consider switching to apixaban 6. Risk factor modification 7. Telemetry monitoring 8. Frequent neuro checks 9. PT/OT SLP  Dorian Pod, MD [facility] [phone]  11/18/2014, 1:06 AM

## 2014-11-18 NOTE — Progress Notes (Signed)
  Echocardiogram 2D Echocardiogram has been performed.  Arvil Chaco 11/18/2014, 11:28 AM

## 2014-11-18 NOTE — Progress Notes (Signed)
STROKE TEAM PROGRESS NOTE   HISTORY [name] is an 74 y.o. female with a past medical history that is relevant for HTN, hyperlipidemia, CAD s.p CABG, ischemic cardiomyopathy, PAF on xarelto, and PAD, brought in by EMS due to acute onset of aphasia, right face weakness. As per grandson who stays with patient the whole day, she was reportedly well yesterday around 6 pm 11/17/2014 ([facility]) but when her daughter returned from work at 8:15 found her " not being herself, unable to talk". EMS was summoned and at the moment of initial encounter with ED attending she was globally aphasic with right face weakness. Patient started improving after returning from the CT suite and was able to follow commands and speak short sentences but couldn't name objects. NIHSS 4. CT brain was personally reviewed and showed no acute abnormality. Daughter said that she was in this ED two weeks ago with complains of left hand weakness and she was switched from pradaxa to xarelto which she took yesterday morning. NIHSS: 4 . MRS: 0 . Patient was not administered TPA secondary to on xarelto and out of the window. She was admitted for further evaluation and treatment.   SUBJECTIVE (INTERVAL HISTORY) Her daughter is at the bedside. Overall she feels her condition is unchanged. She still has mainly expressive aphasia and right facial droop with mild right arm weakness. Speech therapy is in room working with her. As per daughter she was changed from pradaxa to Xarelto about 2 months ago for unclear reason. However, she was here in ED 2 weeks ago for N/V and also left arm tingling for whole day. But not testing was done at that time for rule out stroke. As per daughter, she is taking Xarelto faithfully except stopped 5 days prior to her colonoscopy which was on 11/12/14 and somehow inconclusive. She was on Xarelto  daily but according to her Cre and she may need  for full protection. In addition, she is on ASA  along with Xarelto  at home due to CAD s/p CABG.  OBJECTIVE Temp:  [98 F (36.7 C)-98.3 F (36.8 C)] 98.2 F (36.8 C) (08/17 0548) Pulse Rate:  [62-135] 135 (08/17 0548) Cardiac Rhythm:  [-] Atrial fibrillation (08/17 0548) Resp:  [18-22] 22 (08/17 0548) BP: (127-141)/(75-110) 141/110 mmHg (08/17 0548) SpO2:  [93 %-99 %] 93 % (08/17 0548) Weight:  [40.143 kg (88 lb 8 oz)] 40.143 kg (88 lb 8 oz) (08/16 2301)   Recent Labs Lab 11/17/14 2313 11/18/14 0156 11/18/14 0641  GLUCAP 114* 110* 144*    Recent Labs Lab 11/17/14 2315 11/17/14 2321 11/18/14 0638  NA 138 139 141  K 4.3 4.5 4.7  CL 100* 99* 102  CO2 29  --  28  GLUCOSE 115* 111* 147*  BUN 16 25* 17  CREATININE 1.14* 1.20* 0.98  CALCIUM 9.4  --  9.3    Recent Labs Lab 11/17/14 2315 11/18/14 0638  AST 42* 41  ALT 35 34  ALKPHOS 66 63  BILITOT 0.5 0.8  PROT 7.2 6.9  ALBUMIN 4.4 4.4    Recent Labs Lab 11/17/14 2315 11/17/14 2321 11/18/14 0638  WBC 5.4  --  7.5  NEUTROABS 4.0  --   --   HGB 12.2 14.6 12.2  HCT 41.0 43.0 41.1  MCV 75.4*  --  74.9*  PLT 176  --  198   No results for input(s): CKTOTAL, CKMB, CKMBINDEX, TROPONINI in the last 168 hours.  Recent Labs  11/17/14 2315  LABPROT  21.4*  INR 1.87*   No results for input(s): COLORURINE, LABSPEC, PHURINE, GLUCOSEU, HGBUR, BILIRUBINUR, KETONESUR, PROTEINUR, UROBILINOGEN, NITRITE, LEUKOCYTESUR in the last 72 hours.  Invalid input(s): APPERANCEUR     Component Value Date/Time   CHOL 170 11/18/2014 0638   TRIG 57 11/18/2014 0638   HDL 78 11/18/2014 0638   CHOLHDL 2.2 11/18/2014 0638   VLDL 11 11/18/2014 0638   LDLCALC 81 11/18/2014 0638   No results found for: HGBA1C No results found for: LABOPIA, COCAINSCRNUR, LABBENZ, AMPHETMU, THCU, LABBARB   Recent Labs Lab 11/17/14 2315  ETH <5    IMAGING I have personally reviewed the radiological images below and agree with the radiology interpretations.  Dg Chest 2 View 11/18/2014   Changes indicative of  chronic bronchitis. Generalized interstitial prominence is stable. No new opacity. It should be noted that mild chronic congestive heart failure cannot be excluded in this circumstance. No airspace consolidation. No change in cardiac silhouette.     Ct Head Wo Contrast 11/18/2014    1. No acute intracranial pathology seen on CT. 2. Scattered small vessel ischemic microangiopathy. 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    MRI HEAD  11/18/2014   1. Patchy ischemic nonhemorrhagic multi focal left MCA territory infarcts as above. No significant mass effect. 2. Possible additional acute left ACA territory infarcts within the parasagittal anterior left frontal lobe. No associated hemorrhage or mass effect. 3. Age-related cerebral atrophy with mild chronic microvascular ischemic disease.   MRA HEAD  11/18/2014   1. Motion degraded study. 2. Narrowing with attenuation of the distal left M1 segment, which may be related to stenosis and/or occlusive thrombus. There is complete occlusion of the left MCA just distally at the M2 level within the proximal left sylvian fissure. Left MCA branches are absent. 3. Markedly attenuated left anterior cerebral artery, poorly evaluated on this motion degraded exam. This may be related to stenosis and/or partially occlusive thrombus and/or hypoplasia. Further evaluation with dedicated CTA of the brain may be helpful for further characterization due to more rapid imaging time as clinically desired. 4. Grossly patent vertebrobasilar circulation.     CAROTID DOPPLER  There is 1-39% bilateral ICA stenosis. Vertebral artery flow is antegrade.    TTE - - The patient was in atrial fibrillation. Normal LV size with EF 55%. Septal bounce consistent with prior cardiac surgery. Normal RV size and systolic function. Biatrial enlargement. Moderate TR. Moderate pulmonary hypertension. Mild MR.   PHYSICAL EXAM  Temp:  [97.7 F (36.5 C)-98.4 F (36.9 C)] 98.4 F (36.9  C) (08/17 1635) Pulse Rate:  [62-135] 86 (08/17 1635) Resp:  [18-22] 18 (08/17 1635) BP: (108-149)/(65-110) 108/66 mmHg (08/17 1635) SpO2:  [93 %-100 %] 100 % (08/17 1635) Weight:  [88 lb 8 oz (40.143 kg)] 88 lb 8 oz (40.143 kg) (08/16 2301)  General - Well nourished, well developed, restless likely due to back pain.  Ophthalmologic - not cooperative on exam.  Cardiovascular - irregularly irregular heart rate and rhythm.  Mental Status -  Awake alert restless due to back pain. Expressive aphasia but able to repeat 1 out of 4 sentences on testing with paraphasic errors. Able to name 1 out of 4 items but perseverated on previous item. Able to follow most of the simple commands.   Cranial Nerves II - XII - II - blink bilaterally to visual threat. III, IV, VI - Extraocular movements intact on lateral gaze. V - Facial sensation intact bilaterally. VII - right nasolabial  fold flattening. VIII - not cooperative on exam. X - Palate elevates symmetrically. XI - Chin turning & shoulder shrug intact bilaterally. XII - Tongue protrusion to the right.  Motor Strength - The patient's strength was normal in all extremities except right hand decreased grip and dexterity and pronator drift was present on the right. Bulk was normal and fasciculations were absent.  Motor Tone - Muscle tone was assessed at the neck and appendages and was normal.  Reflexes - The patient's reflexes were symmetrical in all extremities and she had no pathological reflexes.  Sensory - Light touch, temperature/pinprick were assessed and were symmetrical.   Coordination - The patient had normal movements in the hands with no ataxia or dysmetria. Tremor was absent.  Gait and Station - not tested due to safety concerns.   ASSESSMENT/PLAN Ms. [name] is a 74 y.o. female with history of HTN, hyperlipidemia, CAD s.p CABG on ASA 81, ischemic cardiomyopathy, PAF on xarelto 15mg , and PAD presenting with aphasia,  right face weakness. She did not receive IV t-PA due to delay in arrival, on xarelto.   Stroke:  left MCA territory infarcts embolic likely secondary to known atrial fibrillation. Need to rule out malignancy due to weight loss. Pt has been on Xarelto and ASA 81mg  but Xarelto was under-dosed. Has changed to eliquis full dose.    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    MRA  Narrowing distal L M1, complete occlusion distasl L M2, L ACA poorly seen  Carotid Doppler unremarkable  2D Echo  EF 55% No source of embolus  LDL 81  HgbA1c pending  Pan-CT to rule out maligancy - recent colonoscopy not conclusive.  apixaban for VTE prophylaxis  xarelto ( rivaroxaban) prior to admission, now on eliquis (apixaban). Pt may have been under dosed for Xarelto. Now on full dose eliquis. Continue ASA 81mg  for cardiac protection.  Patient counseled to be compliant with her antithrombotic medications  Ongoing aggressive stroke risk factor management  Therapy recommendations:  pending  Disposition:  pending  Atrial Fibrillation  Home anticoagulation:  xarelto 15 mg - may be under dosed as pt Cre is normal  Changed from Xarelto 15mg  to Apixaban 5mg  bid - full dose   Agree with aspirin cessation from neuro standpoint  CHA2DS2-VASc Score = 7, ?2 oral anticoagulation recommended  Age in Years:  ?40   +2    Sex:  Female   Female   +1    Hypertension History:  yes   +1     Diabetes Mellitus:  0   Congestive Heart Failure History:  0  Vascular Disease History:  yes   +1     Stroke/TIA/Thromboembolism History:  yes   +2  Continue eliquis 5mg  bid at discharge   Hypertension  Stable  Hyperlipidemia  Home meds:  crestor 5, resumed in hospital  LDL 81, goal < 70  Continue statin at discharge  Other Stroke Risk Factors  Advanced age  Former Cigarette smoker, quit 16 years ago  Family hx stroke (father)  2 vessel Coronary artery disease s/p  CABG - continue ASA 81  PAD  Ischemic cardiomyopathy - EF 55%  Other Active Problems  COPD  Chronic kidney disease stage III - Cre normalized  Hospital day # 0  Marvel Plan, MD PhD Stroke Neurology 11/18/2014 5:38 PM   To contact Stroke Continuity provider, please refer to [email or web address]. After hours, contact General Neurology

## 2014-11-18 NOTE — H&P (Signed)
[facility] History and Physical  [name] ZOX:[id number] DOB: October 28, 1940 DOA: 11/18/2014  Referring physician: Dr.Horton. PCP: [name], MD  Specialists: Dr. Herbie Baltimore. Cardiology.  Chief Complaint: Difficulty speaking and right facial droop.   History obtained from patient's daughter.  HPI: [name] is a 74 y.o. female with history of chronic atrial fibrillation, cardiomyopathy last EF measured was 40-45%, CAD status post CABG, hypertension, chronic kidney disease was brought to the patient's daughter found that patient was not able to talk and also was found to have right facial droop. As per patient's daughter patient was doing fine and was last seen normal at around 7 PM last night by patient's son. At around 8 PM patient's daughter came in and saw the patient was on the bed which is unusual for her she also was mildly diaphoretic. When they tried to communicate she was not able to talk and patient also was found to have right facial droop. Patient was brought to the ER and CT head did not show anything acute. On-call neurologist Dr. Cyril Mourning was consulted and patient was admitted for further management of acute stroke. Patient was recently changed to xarelto from Pradaxa 2 months ago. Lastly patient also had colonoscopy done during which xarelto was on hold but was restarted again. Patient at this time is not able to communicate but follows commands. Moves all extremities.   Review of Systems: As presented in the history of presenting illness, rest negative.  Past Medical History  Diagnosis Date  . S/P CABG x 2 2009    LIMA-LAD, SVG-RCA, with AV fistula ligation and Maze procedure  . CAD (coronary artery disease), native coronary artery 2009    75% LAD, diffuse 75-90% RCA --> Referred for CABG + MAZE;  Cardiolite 12/2013: No ischemia or Infarction  . Ischemic cardiomyopathy 2012    EF ~40-45% by Echo  . PAF (paroxysmal atrial fibrillation) 04/05/2012    s/p MAZE -- recurrence,  cardioversion-converted to sinus bradycardia --> Now persistent despite being on Amiodarone & Pradaxa  . Essential hypertension   . Dyslipidemia, goal LDL below 70     On Crestor, followed by PCP  . COPD (chronic obstructive pulmonary disease)   . PAD (peripheral artery disease) 2007    s/p L Ileac A stent ; most recent Dopplers July 2012: Less than 50% reduction bilaterally. ABI 0.96 on the right 0.88 on left;;;12/27/2011   -ABI right .87 and left ABI .78  ,LEFT CIA and EIA stent normall patency, left CFA,SFA,and popliteal 0-49%; rgt proximal SFA 50-69%,rgt CIA,EIA, and CFA 0-49%  . Chronic low back pain     scoliosis & lordosis  . H/O: pneumonia   . Hypothyroidism   . Anxiety   . Cervical neck pain with evidence of disc disease     with need for surgery -- November 2015  . Osteoarthritis of back     And neck, hands,spine   Past Surgical History  Procedure Laterality Date  . Iliac artery stent Left 04/06/2005    Ex Iliac - CFA (Smart STENTS -- 7x4 in EIA, 6 x 3 CFA)   . Coronary artery bypass graft  2009    LIMA-LAD, SVG-RCA (also MAZE & AV fistula ligation)  . Maze  2009    along with CABG  . Cardioversion  04/05/2012    Procedure: CARDIOVERSION;  Surgeon: Thurmon Fair, MD;  Location: [facility] ENDOSCOPY;  Service: Cardiovascular;  Laterality: N/A;  . Transthoracic echocardiogram  Jan 2012    EF ~40-45%,  global HK; PAP ~45-50 mmHg  . Nm myoview ltd  May 2013    No ischemia or Infarct  . Abdominal hysterectomy    . Cardiac catheterization  07/30/2007    75% LAD ,3 stenoses of 75-90% in  RCA;circumflex from proximal RCA with no lesion seen,normal ramus intermediate branh; normal LV systoilc function  . Nm cardiolite ltd  12/2013    Non-gated for Afib; No Ischemia or Infarction.  Marland Kitchen Anterior cervical decomp/discectomy fusion N/A 01/23/2014    Procedure: ANTERIOR CERVICAL DECOMPRESSION/DISCECTOMY FUSION CERVICAL FOUR-FIVE,CERVICAL FIVE-SIX,CERVICAL SIX-SEVEN;  Surgeon: Mariam Dollar, MD;   Location: [facility] NEURO ORS;  Service: Neurosurgery;  Laterality: N/A;  . Colonoscopy with propofol N/A 11/12/2014    Procedure: COLONOSCOPY WITH PROPOFOL;  Surgeon: Charna Elizabeth, MD;  Location: [facility] ENDOSCOPY;  Service: Endoscopy;  Laterality: N/A;   Social History:  reports that she quit smoking about 16 years ago. Her smoking use included Cigarettes. She smoked 0.50 packs per day. She has never used smokeless tobacco. She reports that she does not drink alcohol or use illicit drugs. Where does patient live home. Can patient participate in ADLs? Yes.  No Known Allergies  Family History:  Family History  Problem Relation Age of Onset  . Heart attack Mother   . Stroke Father   . Heart disease Brother 60      Prior to Admission medications   Medication Sig Start Date End Date Taking? Authorizing Provider  amiodarone (PACERONE) 200 MG tablet TAKE 1 TABLET (200 MG TOTAL) BY MOUTH DAILY AS DIRECTED. Patient taking differently: TAKE 1 TABLET (200 MG TOTAL) BY MOUTH DAILY AT BEDTIME 06/05/14  Yes Marykay Lex, MD  Cholecalciferol (VITAMIN D) 2000 UNITS CAPS Take 2,000 Units by mouth daily.   Yes Historical Provider, MD  CRESTOR 5 MG tablet TAKE 1 TABLET BY MOUTH IN THE EVENING 10/19/14  Yes Marykay Lex, MD  cyclobenzaprine (FLEXERIL) 10 MG tablet Take 10 mg by mouth 3 (three) times daily as needed for muscle spasms.   Yes Historical Provider, MD  diazepam (VALIUM) 2 MG tablet Take 1 tablet (2 mg total) by mouth every 8 (eight) hours as needed for muscle spasms. Patient taking differently: Take 2 mg by mouth at bedtime.  11/03/14  Yes Blake Divine, MD  gabapentin (NEURONTIN) 300 MG capsule Take 300 mg by mouth at bedtime. 10/03/14  Yes Historical Provider, MD  LANOXIN 62.5 MCG TABS TAKE 1/2 TABLET (31.25MCG) BY MOUTH DAILY Patient taking differently: TAKE 1/2 TABLET (31.25MCG) BY MOUTH EVERY OTHER DAY 08/28/14  Yes Marykay Lex, MD  levothyroxine (SYNTHROID, LEVOTHROID) 75 MCG tablet Take 75 mcg by  mouth daily before breakfast.   Yes Historical Provider, MD  metoprolol (LOPRESSOR) 50 MG tablet Take 0.5 tablets (25 mg total) by mouth 2 (two) times daily. 11/24/13  Yes Marykay Lex, MD  Rivaroxaban (XARELTO) 15 MG TABS tablet Take 1 tablet by mouth daily with a meal.  Do not take any more Pradaxa 08/21/14  Yes Marykay Lex, MD  docusate sodium (COLACE) 100 MG capsule Take 1 capsule (100 mg total) by mouth every 12 (twelve) hours. Patient not taking: Reported on 10/22/2014 06/13/14   Derwood Kaplan, MD    Physical Exam: Filed Vitals:   11/17/14 2301 11/18/14 0137  BP: 136/93   Pulse: 62   Temp: 98 F (36.7 C) 98.3 F (36.8 C)  TempSrc: Oral   Resp: 18   Height: 4\' 10"  (1.473 m)   Weight: 40.143 kg (  88 lb 8 oz)   SpO2: 97%      General:  Moderately built and nourished.  Eyes: Anicteric. No pallor.  ENT: No discharge from the ears eyes nose and mouth.  Neck: No mass felt. No neck rigidity.  Cardiovascular: S1 and S2 heard.  Respiratory: No rhonchi or crepitations.  Abdomen: Soft nontender bowel sounds present.  Skin: No rash.  Musculoskeletal: No edema.  Psychiatric: Patient appears medically confused.  Neurologic: Alert awake appears mildly confused and is not able to talk from the stroke. Right facial droop. PERRLA positive. Tongue is midline. Moves all extremities.  Labs on Admission:  Basic Metabolic Panel:  Recent Labs Lab 11/17/14 2315 11/17/14 2321  NA 138 139  K 4.3 4.5  CL 100* 99*  CO2 29  --   GLUCOSE 115* 111*  BUN 16 25*  CREATININE 1.14* 1.20*  CALCIUM 9.4  --    Liver Function Tests:  Recent Labs Lab 11/17/14 2315  AST 42*  ALT 35  ALKPHOS 66  BILITOT 0.5  PROT 7.2  ALBUMIN 4.4   No results for input(s): LIPASE, AMYLASE in the last 168 hours. No results for input(s): AMMONIA in the last 168 hours. CBC:  Recent Labs Lab 11/17/14 2315 11/17/14 2321  WBC 5.4  --   NEUTROABS 4.0  --   HGB 12.2 14.6  HCT 41.0 43.0  MCV  75.4*  --   PLT 176  --    Cardiac Enzymes: No results for input(s): CKTOTAL, CKMB, CKMBINDEX, TROPONINI in the last 168 hours.  BNP (last 3 results)  Recent Labs  05/02/14 1902  BNP 344.0*    ProBNP (last 3 results) No results for input(s): PROBNP in the last 8760 hours.  CBG:  Recent Labs Lab 11/17/14 2313 11/18/14 0156  GLUCAP 114* 110*    Radiological Exams on Admission: Ct Head Wo Contrast  11/18/2014   CLINICAL DATA:  Code stroke. Left-sided facial droop. Initial encounter.  EXAM: CT HEAD WITHOUT CONTRAST  TECHNIQUE: Contiguous axial images were obtained from the base of the skull through the vertex without intravenous contrast.  COMPARISON:  None.  FINDINGS: There is no evidence of acute infarction, mass lesion, or intra- or extra-axial hemorrhage on CT.  Scattered periventricular and subcortical white matter change likely reflects small vessel ischemic microangiopathy. A small chronic infarct is noted at the right basal ganglia.  The posterior fossa, including the cerebellum, brainstem and fourth ventricle, is within normal limits. The third and lateral ventricles are unremarkable in appearance. The cerebral hemispheres demonstrate grossly normal gray-white differentiation. No mass effect or midline shift is seen.  There is no evidence of fracture; visualized osseous structures are unremarkable in appearance. The orbits are within normal limits. The paranasal sinuses and mastoid air cells are well-aerated. No significant soft tissue abnormalities are seen.  IMPRESSION: 1. No acute intracranial pathology seen on CT. 2. Scattered small vessel ischemic microangiopathy. 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  These results were called by telephone at the time of interpretation on 11/18/2014 at 12:36 am to Dr. Leroy Kennedy, who verbally acknowledged these results.   Electronically Signed   By: Roanna Raider M.D.   On: 11/18/2014 00:38     Assessment/Plan Principal Problem:    Stroke Active Problems:   2 Vessel CAD - s/p CABG; LIMA-LAD, SVG-RCA   Chronic atrial fibrillation   Essential hypertension   Ischemic cardiomyopathy   S/P CABG x 2   CKD (chronic kidney disease) stage 3, GFR  30-59 ml/min   1. Stroke - probably embolic given that patient has history of A. fib. I have discussed with on-call neurologist Dr. Leroy Kennedy. Dr. Shaune Pascal is advised to place patient on Apixaban and discontinue xarelto. Patient has failed swallow evaluation and until patient can take orally patient will be on aspirin. To discontinue aspirin once patient takes Apixaban. Follow MRI/MRA brain 2-D echo and carotid Doppler. Physical therapy and speech therapy consult. 2. Chronic atrial fibrillation - since patient has failed swallow I have placed patient on scheduled IV metoprolol dose. Change to oral dose once patient can swallow orally. I have requested pharmacy to dose Lanoxin. Check digoxin level. Patient is also on amiodarone. Patient's chads 2 vasc score is more than 2 and see #1 with regarding to anticoagulation. As noted on Apixaban. 3. CAD status post CABG - denies any chest pain. Since patient was diaphoretic cycle cardiac markers. 4. Cardiomyopathy - appears compensated. 5. Chronic kidney disease stage III - follow metabolic panel.  I have reviewed patient's old charts on labs. I have discussed with on-call neurologist Dr. Cyril Mourning.   DVT Prophylaxis Apixaban.  Code Status: Full code.  Family Communication: Discussed patient's daughter.  Disposition Plan: Admit to inpatient.    KAKRAKANDY,ARSHAD N. [facility] Pager [phone].  If 7PM-7AM, please contact night-coverage [email or web address] Password [facility] 11/18/2014, 3:28 AM

## 2014-11-18 NOTE — Progress Notes (Signed)
Pharmacy:  Dr. Toniann Fail asked pharmacy to verify pt'd lanoxin dose.  [facility]: on July 18th she got Lanoxin 62.5 mcg tablets with sig to take 1/2 tablet = 32.25 mcg daily per Dr. Herbie Baltimore.  Upon admission medication history she stated she was taking 1/2 tablet every other day.  Herby Abraham, Pharm.D. [phone] 11/18/2014 11:10 AM

## 2014-11-18 NOTE — Progress Notes (Signed)
Patient admitted after midnight- please see H&P. 1. Stroke - probably embolic given that patient has history of A. Fib. - Dr. Cyril Mourning advised to place patient on Apixaban and discontinue xarelto.  -MRI/MRA brain + -2-D echo and carotid Doppler.  -Physical therapy and speech therapy consult. 2. Chronic atrial fibrillation - since patient has failed swallow I have placed patient on scheduled IV metoprolol dose. Change to oral dose once patient can swallow orally. I have requested pharmacy to dose Lanoxin. Check digoxin level. Patient is also on amiodarone. Patient's chads 2 vasc score is more than 2 and see #1 with regarding to anticoagulation. As noted on Apixaban. 3. CAD status post CABG - denies any chest pain. Since patient was diaphoretic cycle cardiac markers. 4. Cardiomyopathy - appears compensated. 5. Chronic kidney disease stage III - follow metabolic panel. 6. Weight loss- CT abd/chest ordered by Dr. Roda Shutters, recent colonoscopy  Marlin Canary

## 2014-11-18 NOTE — ED Provider Notes (Signed)
CSN: [id number]     Arrival date & time 11/17/14  2220 History   This chart was scribed for Shon Baton, MD by Arlan Organ, ED Scribe. This patient was seen in room B17C/B17C and the patient's care was started 12:18 AM.   Chief Complaint  Patient presents with  . Altered Mental Status   The history is provided by a relative. No language interpreter was used.    Patient evaluated at 12:18 AM.  LEVEL 5 CAVEAT DUE TO CODE STROKE  HPI Comments: [name] is a 74 y.o. female with a PMHx of CAD, PAF, COPD, and PAD who presents to the Emergency Department here for altered mental status this evening. Daughter states pt has not been able to speak since approximately 8:30 PM after being in bed for 2 hours. Daughter states "this is all very abnormal for her". At baseline pt is very talkative and active. Last known normal 6:00 PM by her grandson. No recent fever. PSHx includes CABG x 2- most recent in 2009, cardiac catheterization- 2009, and cardioversion-2014. No known allergies to medications.  Past Medical History  Diagnosis Date  . S/P CABG x 2 2009    LIMA-LAD, SVG-RCA, with AV fistula ligation and Maze procedure  . CAD (coronary artery disease), native coronary artery 2009    75% LAD, diffuse 75-90% RCA --> Referred for CABG + MAZE;  Cardiolite 12/2013: No ischemia or Infarction  . Ischemic cardiomyopathy 2012    EF ~40-45% by Echo  . PAF (paroxysmal atrial fibrillation) 04/05/2012    s/p MAZE -- recurrence, cardioversion-converted to sinus bradycardia --> Now persistent despite being on Amiodarone & Pradaxa  . Essential hypertension   . Dyslipidemia, goal LDL below 70     On Crestor, followed by PCP  . COPD (chronic obstructive pulmonary disease)   . PAD (peripheral artery disease) 2007    s/p L Ileac A stent ; most recent Dopplers July 2012: Less than 50% reduction bilaterally. ABI 0.96 on the right 0.88 on left;;;12/27/2011   -ABI right .87 and left ABI .78  ,LEFT CIA and EIA stent  normall patency, left CFA,SFA,and popliteal 0-49%; rgt proximal SFA 50-69%,rgt CIA,EIA, and CFA 0-49%  . Chronic low back pain     scoliosis & lordosis  . H/O: pneumonia   . Hypothyroidism   . Anxiety   . Cervical neck pain with evidence of disc disease     with need for surgery -- November 2015  . Osteoarthritis of back     And neck, hands,spine   Past Surgical History  Procedure Laterality Date  . Iliac artery stent Left 04/06/2005    Ex Iliac - CFA (Smart STENTS -- 7x4 in EIA, 6 x 3 CFA)   . Coronary artery bypass graft  2009    LIMA-LAD, SVG-RCA (also MAZE & AV fistula ligation)  . Maze  2009    along with CABG  . Cardioversion  04/05/2012    Procedure: CARDIOVERSION;  Surgeon: Thurmon Fair, MD;  Location: [facility] ENDOSCOPY;  Service: Cardiovascular;  Laterality: N/A;  . Transthoracic echocardiogram  Jan 2012    EF ~40-45%, global HK; PAP ~45-50 mmHg  . Nm myoview ltd  May 2013    No ischemia or Infarct  . Abdominal hysterectomy    . Cardiac catheterization  07/30/2007    75% LAD ,3 stenoses of 75-90% in  RCA;circumflex from proximal RCA with no lesion seen,normal ramus intermediate branh; normal LV systoilc function  . Nm cardiolite ltd  12/2013    Non-gated for Afib; No Ischemia or Infarction.  Marland Kitchen Anterior cervical decomp/discectomy fusion N/A 01/23/2014    Procedure: ANTERIOR CERVICAL DECOMPRESSION/DISCECTOMY FUSION CERVICAL FOUR-FIVE,CERVICAL FIVE-SIX,CERVICAL SIX-SEVEN;  Surgeon: Mariam Dollar, MD;  Location: [facility] NEURO ORS;  Service: Neurosurgery;  Laterality: N/A;  . Colonoscopy with propofol N/A 11/12/2014    Procedure: COLONOSCOPY WITH PROPOFOL;  Surgeon: Charna Elizabeth, MD;  Location: [facility] ENDOSCOPY;  Service: Endoscopy;  Laterality: N/A;   Family History  Problem Relation Age of Onset  . Heart attack Mother   . Stroke Father   . Heart disease Brother 67   Social History  Substance Use Topics  . Smoking status: Former Smoker -- 0.50 packs/day    Types: Cigarettes    Quit date:  01/07/1998  . Smokeless tobacco: Never Used  . Alcohol Use: No   OB History    No data available     Review of Systems  Unable to perform ROS: Patient nonverbal      Allergies  Review of patient's allergies indicates no known allergies.  Home Medications   Prior to Admission medications   Medication Sig Start Date End Date Taking? Authorizing Provider  amiodarone (PACERONE) 200 MG tablet TAKE 1 TABLET (200 MG TOTAL) BY MOUTH DAILY AS DIRECTED. Patient taking differently: TAKE 1 TABLET (200 MG TOTAL) BY MOUTH DAILY AT BEDTIME 06/05/14   Marykay Lex, MD  Cholecalciferol (VITAMIN D) 2000 UNITS CAPS Take 2,000 Units by mouth daily.    Historical Provider, MD  CRESTOR 5 MG tablet TAKE 1 TABLET BY MOUTH IN THE EVENING 10/19/14   Marykay Lex, MD  diazepam (VALIUM) 2 MG tablet Take 1 tablet (2 mg total) by mouth every 8 (eight) hours as needed for muscle spasms. 11/03/14   Blake Divine, MD  docusate sodium (COLACE) 100 MG capsule Take 1 capsule (100 mg total) by mouth every 12 (twelve) hours. Patient not taking: Reported on 10/22/2014 06/13/14   Derwood Kaplan, MD  gabapentin (NEURONTIN) 300 MG capsule Take 300 mg by mouth at bedtime. 10/03/14   Historical Provider, MD  LANOXIN 62.5 MCG TABS TAKE 1/2 TABLET (31.25MCG) BY MOUTH DAILY Patient taking differently: TAKE 1/2 TABLET (31.25MCG) BY MOUTH EVERY OTHER DAY 08/28/14   Marykay Lex, MD  levothyroxine (SYNTHROID, LEVOTHROID) 75 MCG tablet Take 75 mcg by mouth daily before breakfast.    Historical Provider, MD  metoprolol (LOPRESSOR) 50 MG tablet Take 0.5 tablets (25 mg total) by mouth 2 (two) times daily. 11/24/13   Marykay Lex, MD  Rivaroxaban (XARELTO) 15 MG TABS tablet Take 1 tablet by mouth daily with a meal.  Do not take any more Pradaxa 08/21/14   Marykay Lex, MD   Triage Vitals: BP 136/93 mmHg  Pulse 62  Temp(Src) 98 F (36.7 C) (Oral)  Resp 18  Ht  (1.473 m)  Wt 88 lb 8 oz (40.143 kg)  BMI 18.50 kg/m2  SpO2  97%   Physical Exam  Constitutional: No distress.  HENT:  Head: Normocephalic and atraumatic.  Eyes: Pupils are equal, round, and reactive to light.  Cardiovascular: Normal rate and normal heart sounds.   Irregular rhythm  Pulmonary/Chest: Effort normal and breath sounds normal. No respiratory distress. She has no wheezes.  Abdominal: Soft. Bowel sounds are normal. There is no tenderness. There is no rebound.  Neurological:  Alert, difficult to assess orientation as patient is a phasic, she appears to have both receptive and expressive aphasia. She does not  follow commands but is attentive, appears to move all 4 extremities, right facial droop noted  Skin: Skin is warm and dry.  Nursing note and vitals reviewed.   ED Course  Procedures (including critical care time)  DIAGNOSTIC STUDIES: Oxygen Saturation is 97% on RA, Normal by my interpretation.    COORDINATION OF CARE: 12:18 AM- Pt evaluated at this time.  12:20 AM- Will order CT head without contrast, i-stat chem 8, ethanol, i-stat troponin, rapid drug screen, urinalysis, PT-INR, APTT, CBC, CMP, CBG, and EKG. Discussed treatment plan with pt at bedside and pt agreed to plan.     Labs Review Labs Reviewed  PROTIME-INR - Abnormal; Notable for the following:    Prothrombin Time 21.4 (*)    INR 1.87 (*)    All other components within normal limits  APTT - Abnormal; Notable for the following:    aPTT 46 (*)    All other components within normal limits  CBC - Abnormal; Notable for the following:    RBC 5.44 (*)    MCV 75.4 (*)    MCH 22.4 (*)    MCHC 29.8 (*)    RDW 16.8 (*)    All other components within normal limits  COMPREHENSIVE METABOLIC PANEL - Abnormal; Notable for the following:    Chloride 100 (*)    Glucose, Bld 115 (*)    Creatinine, Ser 1.14 (*)    AST 42 (*)    GFR calc non Af Amer 46 (*)    GFR calc Af Amer 53 (*)    All other components within normal limits  CBG MONITORING, ED - Abnormal; Notable for  the following:    Glucose-Capillary 114 (*)    All other components within normal limits  I-STAT CHEM 8, ED - Abnormal; Notable for the following:    Chloride 99 (*)    BUN 25 (*)    Creatinine, Ser 1.20 (*)    Glucose, Bld 111 (*)    All other components within normal limits  DIFFERENTIAL  ETHANOL  URINE RAPID DRUG SCREEN, HOSP PERFORMED  URINALYSIS, ROUTINE W REFLEX MICROSCOPIC (NOT AT [facility])  I-STAT TROPOININ, ED  I-STAT CHEM 8, ED  I-STAT TROPOININ, ED    Imaging Review Ct Head Wo Contrast  11/18/2014   CLINICAL DATA:  Code stroke. Left-sided facial droop. Initial encounter.  EXAM: CT HEAD WITHOUT CONTRAST  TECHNIQUE: Contiguous axial images were obtained from the base of the skull through the vertex without intravenous contrast.  COMPARISON:  None.  FINDINGS: There is no evidence of acute infarction, mass lesion, or intra- or extra-axial hemorrhage on CT.  Scattered periventricular and subcortical white matter change likely reflects small vessel ischemic microangiopathy. A small chronic infarct is noted at the right basal ganglia.  The posterior fossa, including the cerebellum, brainstem and fourth ventricle, is within normal limits. The third and lateral ventricles are unremarkable in appearance. The cerebral hemispheres demonstrate grossly normal gray-white differentiation. No mass effect or midline shift is seen.  There is no evidence of fracture; visualized osseous structures are unremarkable in appearance. The orbits are within normal limits. The paranasal sinuses and mastoid air cells are well-aerated. No significant soft tissue abnormalities are seen.  IMPRESSION: 1. No acute intracranial pathology seen on CT. 2. Scattered small vessel ischemic microangiopathy. 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  These results were called by telephone at the time of interpretation on 11/18/2014 at 12:36 am to Dr. Leroy Kennedy, who verbally acknowledged these results.  Electronically Signed    By: Roanna Raider M.D.   On: 11/18/2014 00:38   I have personally reviewed and evaluated these images and lab results as part of my medical decision-making.   EKG Interpretation   Date/Time:  Tuesday November 17 2014 23:10:42 EDT Ventricular Rate:  90 PR Interval:    QRS Duration: 102 QT Interval:  400 QTC Calculation: 489 R Axis:   -81 Text Interpretation:  Atrial fibrillation with premature ventricular or  aberrantly conducted complexes Left axis deviation Incomplete right bundle  branch block Anteroseptal infarct , age undetermined Abnormal ECG No  significant change since last tracing Confirmed by Sathvika Ojo  MD, Toni Amend  ([id number]) on 11/18/2014 12:32:12 AM      MDM   Final diagnoses:  Stroke    Patient presents with strokelike symptoms. On my initial evaluation, patient appears to have both a receptive and expressive aphasia with difficulty following commands and expressing herself. She is a right-sided facial droop. Upon my evaluation, code stroke was initiated. Lab work regarding been obtained in triage and patient was transported to CT. She was evaluated by Dr. Leroy Kennedy.  He also appreciated aphasia and right facial droop. Aphasia appeared to be improving. However facial droop still present. Patient is approximately 6 hours out from last seen normal. Not a TPA candidate given Xarelto use. Given NIH of 4, per Dr. Leroy Kennedy, patient will be admitted to the hospital for further evaluation and management. No intervention at this time.  I personally performed the services described in this documentation, which was scribed in my presence. The recorded information has been reviewed and is accurate.   Shon Baton, MD 11/18/14 [phone]

## 2014-11-18 NOTE — Progress Notes (Signed)
VASCULAR LAB PRELIMINARY  PRELIMINARY  PRELIMINARY  PRELIMINARY  Carotid duplex  completed.    Preliminary report:  Bilateral:  1-39% ICA stenosis.  Vertebral artery flow is antegrade.      Sakinah Rosamond, RVT 11/18/2014, 1:49 PM

## 2014-11-18 NOTE — ED Notes (Signed)
Called [facility] for Code Stroke @ [phone] 11/18/14

## 2014-11-18 NOTE — Progress Notes (Signed)
Initial Nutrition Assessment  DOCUMENTATION CODES:   Underweight  INTERVENTION:  Provide Ensure Enlive po BID, each supplement provides 350 kcal and 20 grams of protein  Monitor PO intake for adequacy  NUTRITION DIAGNOSIS:   Predicted suboptimal nutrient intake related to dysphagia, lethargy/confusion as evidenced by  (patient's chart).   GOAL:   Patient will meet greater than or equal to 90% of their needs   MONITOR:   PO intake, Supplement acceptance, Diet advancement, Labs, Weight trends  REASON FOR ASSESSMENT:   Malnutrition Screening Tool    ASSESSMENT:   74 y.o. female with history of chronic atrial fibrillation, cardiomyopathy last EF measured was 40-45%, CAD status post CABG, hypertension, chronic kidney disease was brought to the patient's daughter found that patient was not able to talk and also was found to have right facial droop.   Pt was advanced from NPO to a dysphagia 2 diet with thin liquids this morning. Patient was asleep with lunch tray untouched at time of visit. She appears to have severe wasting of temporal region. Weight appears stable at 88 lbs over the past 6 months per weight history. Will monitor PO intake/adequacy.   Labs reviewed. Lipid panel WNL.   Diet Order:  DIET DYS 2 Room service appropriate?: Yes with Assist; Fluid consistency:: Thin  Skin:  Reviewed, no issues  Last BM:  8/16  Height:   Ht Readings from Last 1 Encounters:  11/17/14  (1.473 m)    Weight:   Wt Readings from Last 1 Encounters:  11/17/14 88 lb 8 oz (40.143 kg)    Ideal Body Weight:  43.9 kg  BMI:  Body mass index is 18.5 kg/(m^2).  Estimated Nutritional Needs:   Kcal:  1200-1400  Protein:  55-65 grams  Fluid:  1.2-1.4 L/day  EDUCATION NEEDS:   No education needs identified at this time  Dorothea Ogle RD, LDN Inpatient Clinical Dietitian Pager: [phone] After Hours Pager: [phone]

## 2014-11-18 NOTE — Discharge Instructions (Signed)

## 2014-11-18 NOTE — Progress Notes (Signed)
ANTICOAGULATION CONSULT NOTE - Initial Consult  Pharmacy Consult for Xarelto>>>>Apixaban Indication: atrial fibrillation, secondary stroke prophylaxis  No Known Allergies  Patient Measurements: Height:  (147.3 cm) Weight: 88 lb 8 oz (40.143 kg) IBW/kg (Calculated) : 40.9  Vital Signs: Temp: 98.2 F (36.8 C) (08/17 0348) Temp Source: Oral (08/17 0348) BP: 127/75 mmHg (08/17 0348) Pulse Rate: 109 (08/17 0348)  Labs:  Recent Labs  11/17/14 2315 11/17/14 2321  HGB 12.2 14.6  HCT 41.0 43.0  PLT 176  --   APTT 46*  --   LABPROT 21.4*  --   INR 1.87*  --   CREATININE 1.14* 1.20*    Estimated Creatinine Clearance: 25.6 mL/min (by C-G formula based on Cr of 1.2).   Medical History: Past Medical History  Diagnosis Date  . S/P CABG x 2 2009    LIMA-LAD, SVG-RCA, with AV fistula ligation and Maze procedure  . CAD (coronary artery disease), native coronary artery 2009    75% LAD, diffuse 75-90% RCA --> Referred for CABG + MAZE;  Cardiolite 12/2013: No ischemia or Infarction  . Ischemic cardiomyopathy 2012    EF ~40-45% by Echo  . PAF (paroxysmal atrial fibrillation) 04/05/2012    s/p MAZE -- recurrence, cardioversion-converted to sinus bradycardia --> Now persistent despite being on Amiodarone & Pradaxa  . Essential hypertension   . Dyslipidemia, goal LDL below 70     On Crestor, followed by PCP  . COPD (chronic obstructive pulmonary disease)   . PAD (peripheral artery disease) 2007    s/p L Ileac A stent ; most recent Dopplers July 2012: Less than 50% reduction bilaterally. ABI 0.96 on the right 0.88 on left;;;12/27/2011   -ABI right .87 and left ABI .78  ,LEFT CIA and EIA stent normall patency, left CFA,SFA,and popliteal 0-49%; rgt proximal SFA 50-69%,rgt CIA,EIA, and CFA 0-49%  . Chronic low back pain     scoliosis & lordosis  . H/O: pneumonia   . Hypothyroidism   . Anxiety   . Cervical neck pain with evidence of disc disease     with need for surgery -- November  2015  . Osteoarthritis of back     And neck, hands,spine   Assessment: Changing from Xarelto to Apixaban per neuro rec's for afib/stroke prophylaxis. Last dose of Xarelto on 8/16 ~1800. CBC good. Mild bump in Scr at 1.2. Other labs/imaging reviewed.   Goal of Therapy:  Monitor platelets by anticoagulation protocol: Yes   Plan:  -Start Apixaban 5 mg BID at ~1800 tonight -Stop ASA after Apixaban started per MD request -Minimum q72h CBC while inpatient -Monitor for bleeding  Abran Duke 11/18/2014,4:29 AM

## 2014-11-19 ENCOUNTER — Encounter (HOSPITAL_COMMUNITY): Payer: Self-pay | Admitting: Cardiology

## 2014-11-19 DIAGNOSIS — I639 Cerebral infarction, unspecified: Secondary | ICD-10-CM

## 2014-11-19 DIAGNOSIS — I482 Chronic atrial fibrillation: Secondary | ICD-10-CM

## 2014-11-19 DIAGNOSIS — I255 Ischemic cardiomyopathy: Secondary | ICD-10-CM

## 2014-11-19 DIAGNOSIS — Z951 Presence of aortocoronary bypass graft: Secondary | ICD-10-CM

## 2014-11-19 DIAGNOSIS — I213 ST elevation (STEMI) myocardial infarction of unspecified site: Secondary | ICD-10-CM

## 2014-11-19 LAB — LIPID PANEL
CHOLESTEROL: 138 mg/dL (ref 0–200)
HDL: 65 mg/dL (ref >40)
LDL Cholesterol: 63 mg/dL (ref 0–99)
Total CHOL/HDL Ratio: 2.1 RATIO
Triglycerides: 49 mg/dL (ref <150)
VLDL: 10 mg/dL (ref 0–40)

## 2014-11-19 LAB — HEMOGLOBIN A1C
HEMOGLOBIN A1C: 5.5 % (ref 4.8–5.6)
Mean Plasma Glucose: 111 mg/dL

## 2014-11-19 MED ORDER — ENSURE ENLIVE PO LIQD: 237.0000 mL | Freq: Two times a day (BID) | ORAL | Status: DC

## 2014-11-19 MED ORDER — APIXABAN 5 MG PO TABS: 5.0000 mg | ORAL_TABLET | Freq: Two times a day (BID) | ORAL | Status: AC

## 2014-11-19 MED ORDER — MAGNESIUM HYDROXIDE 400 MG/5ML PO SUSP
960.0000 mL | Freq: Once | ORAL | Status: DC
  Filled 2014-11-19: qty 240

## 2014-11-19 MED ORDER — LACTULOSE 10 GM/15ML PO SOLN: 10.0000 g | Freq: Two times a day (BID) | ORAL | Status: DC | PRN

## 2014-11-19 MED ORDER — DIGOXIN 62.5 MCG PO TABS: ORAL_TABLET | ORAL | Status: DC

## 2014-11-19 MED ORDER — ASPIRIN 81 MG PO TBEC: 81.0000 mg | DELAYED_RELEASE_TABLET | Freq: Every day | ORAL | Status: DC

## 2014-11-19 MED ORDER — ASPIRIN EC 81 MG PO TBEC
81.0000 mg | DELAYED_RELEASE_TABLET | Freq: Every day | ORAL | Status: DC
  Administered 2014-11-19: 81 mg via ORAL

## 2014-11-19 MED ORDER — HYDROCODONE-ACETAMINOPHEN 5-325 MG PO TABS
1.0000 | ORAL_TABLET | Freq: Four times a day (QID) | ORAL | Status: DC | PRN
  Administered 2014-11-19: 1 via ORAL
  Filled 2014-11-19: qty 1

## 2014-11-19 MED ORDER — BISACODYL 10 MG RE SUPP: 10.0000 mg | Freq: Every day | RECTAL | Status: DC | PRN

## 2014-11-19 MED ORDER — HYDROCODONE-ACETAMINOPHEN 5-325 MG PO TABS: 1.0000 | ORAL_TABLET | Freq: Four times a day (QID) | ORAL | Status: DC | PRN

## 2014-11-19 MED ORDER — BISACODYL 10 MG RE SUPP
10.0000 mg | Freq: Every day | RECTAL | Status: DC | PRN
  Administered 2014-11-19: 10 mg via RECTAL
  Filled 2014-11-19: qty 1

## 2014-11-19 NOTE — Progress Notes (Signed)
STROKE TEAM PROGRESS NOTE   HISTORY [name] is an 74 y.o. female with a past medical history that is relevant for HTN, hyperlipidemia, CAD s.p CABG, ischemic cardiomyopathy, PAF on xarelto, and PAD, brought in by EMS due to acute onset of aphasia, right face weakness. As per grandson who stays with patient the whole day, she was reportedly well yesterday around 6 pm 11/17/2014 ([facility]) but when her daughter returned from work at 8:15 found her " not being herself, unable to talk". EMS was summoned and at the moment of initial encounter with ED attending she was globally aphasic with right face weakness. Patient started improving after returning from the CT suite and was able to follow commands and speak short sentences but couldn't name objects. NIHSS 4. CT brain was personally reviewed and showed no acute abnormality. Daughter said that she was in this ED two weeks ago with complains of left hand weakness and she was switched from pradaxa to xarelto which she took yesterday morning. NIHSS: 4 . MRS: 0 . Patient was not administered TPA secondary to on xarelto and out of the window. She was admitted for further evaluation and treatment.   SUBJECTIVE (INTERVAL HISTORY) No family members present. The patient's speech is significantly improved. The patient is oriented and answers questions appropriately.  OBJECTIVE Temp:  [98 F (36.7 C)-98.5 F (36.9 C)] 98 F (36.7 C) (08/18 1018) Pulse Rate:  [75-95] 81 (08/18 1018) Cardiac Rhythm:  [-] Atrial fibrillation (08/18 0749) Resp:  [18-20] 20 (08/18 1018) BP: (108-132)/(65-72) 122/69 mmHg (08/18 1018) SpO2:  [97 %-100 %] 100 % (08/18 1018)   Recent Labs Lab 11/17/14 2313 11/18/14 0156 11/18/14 0641 11/18/14 1152 11/18/14 2153  GLUCAP 114* 110* 144* 108* 93    Recent Labs Lab 11/17/14 2315 11/17/14 2321 11/18/14 0638  NA 138 139 141  K 4.3 4.5 4.7  CL 100* 99* 102  CO2 29  --  28  GLUCOSE 115* 111* 147*  BUN 16 25* 17   CREATININE 1.14* 1.20* 0.98  CALCIUM 9.4  --  9.3    Recent Labs Lab 11/17/14 2315 11/18/14 0638  AST 42* 41  ALT 35 34  ALKPHOS 66 63  BILITOT 0.5 0.8  PROT 7.2 6.9  ALBUMIN 4.4 4.4    Recent Labs Lab 11/17/14 2315 11/17/14 2321 11/18/14 0638  WBC 5.4  --  7.5  NEUTROABS 4.0  --   --   HGB 12.2 14.6 12.2  HCT 41.0 43.0 41.1  MCV 75.4*  --  74.9*  PLT 176  --  198   No results for input(s): CKTOTAL, CKMB, CKMBINDEX, TROPONINI in the last 168 hours.  Recent Labs  11/17/14 2315  LABPROT 21.4*  INR 1.87*   No results for input(s): COLORURINE, LABSPEC, PHURINE, GLUCOSEU, HGBUR, BILIRUBINUR, KETONESUR, PROTEINUR, UROBILINOGEN, NITRITE, LEUKOCYTESUR in the last 72 hours.  Invalid input(s): APPERANCEUR     Component Value Date/Time   CHOL 138 11/19/2014 0615   TRIG 49 11/19/2014 0615   HDL 65 11/19/2014 0615   CHOLHDL 2.1 11/19/2014 0615   VLDL 10 11/19/2014 0615   LDLCALC 63 11/19/2014 0615   Lab Results  Component Value Date   HGBA1C 5.5 11/17/2014   No results found for: LABOPIA, COCAINSCRNUR, LABBENZ, AMPHETMU, THCU, LABBARB   Recent Labs Lab 11/17/14 2315  ETH <5    IMAGING I have personally reviewed the radiological images below and agree with the radiology interpretations.  Dg Chest 2 View 11/18/2014   Changes  indicative of chronic bronchitis. Generalized interstitial prominence is stable. No new opacity. It should be noted that mild chronic congestive heart failure cannot be excluded in this circumstance. No airspace consolidation. No change in cardiac silhouette.     Ct Head Wo Contrast 11/18/2014    1. No acute intracranial pathology seen on CT. 2. Scattered small vessel ischemic microangiopathy. 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    MRI HEAD  11/18/2014   1. Patchy ischemic nonhemorrhagic multi focal left MCA territory infarcts as above. No significant mass effect. 2. Possible additional acute left ACA territory infarcts within  the parasagittal anterior left frontal lobe. No associated hemorrhage or mass effect. 3. Age-related cerebral atrophy with mild chronic microvascular ischemic disease.   MRA HEAD  11/18/2014   1. Motion degraded study. 2. Narrowing with attenuation of the distal left M1 segment, which may be related to stenosis and/or occlusive thrombus. There is complete occlusion of the left MCA just distally at the M2 level within the proximal left sylvian fissure. Left MCA branches are absent. 3. Markedly attenuated left anterior cerebral artery, poorly evaluated on this motion degraded exam. This may be related to stenosis and/or partially occlusive thrombus and/or hypoplasia. Further evaluation with dedicated CTA of the brain may be helpful for further characterization due to more rapid imaging time as clinically desired. 4. Grossly patent vertebrobasilar circulation.     CUS -  There is 1-39% bilateral ICA stenosis. Vertebral artery flow is antegrade.    TTE - - The patient was in atrial fibrillation. Normal LV size with EF 55%. Septal bounce consistent with prior cardiac surgery. Normal RV size and systolic function. Biatrial enlargement. Moderate TR. Moderate pulmonary hypertension. Mild MR.  CT chest and abdomen and pelvis 1. There appears to be a thrombus in the left atrial appendage. Chronic cardiomegaly with particular enlargement of the right atrium and right ventricle. 2. Emphysema. 3. Large amount of stool in the rectum which could represent a fecal impaction. 4. Extensive atherosclerosis. 5. Amiodarone hepatotoxicity.  PHYSICAL EXAM  Temp:  [98 F (36.7 C)-98.5 F (36.9 C)] 98 F (36.7 C) (08/18 1018) Pulse Rate:  [75-95] 81 (08/18 1018) Resp:  [18-20] 20 (08/18 1018) BP: (108-132)/(65-72) 122/69 mmHg (08/18 1018) SpO2:  [97 %-100 %] 100 % (08/18 1018)  General - Well nourished, well developed, restless likely due to back pain.  Ophthalmologic - not cooperative on  exam.  Cardiovascular - irregularly irregular heart rate and rhythm.  Mental Status -  Awake alert, mild expressive aphasia, with occasional paraphasic errors, able to repeat 2 out of 3 sentences. Able to name 3 out of 4 items. Able to follow most of the simple commands.   Cranial Nerves II - XII - II - blink bilaterally to visual threat. III, IV, VI - Extraocular movements intact on lateral gaze. V - Facial sensation intact bilaterally. VII - right nasolabial fold mild flattening. VIII - not cooperative on exam. X - Palate elevates symmetrically. XI - Chin turning & shoulder shrug intact bilaterally. XII - Tongue protrusion in the middle  Motor Strength - The patient's strength was normal in all extremities except right hand mild dexterity difficulty, and pronator drift was absent. Bulk was normal and fasciculations were absent.  Motor Tone - Muscle tone was assessed at the neck and appendages and was normal.  Reflexes - The patient's reflexes were symmetrical in all extremities and she had no pathological reflexes.  Sensory - Light touch, temperature/pinprick were assessed and  were symmetrical.   Coordination - The patient had normal movements in the hands with no ataxia or dysmetria. Tremor was absent.  Gait and Station - not tested due to safety concerns.   ASSESSMENT/PLAN Ms. [name] is a 74 y.o. female with history of HTN, hyperlipidemia, CAD s.p CABG on ASA 81, ischemic cardiomyopathy, PAF on xarelto 15mg , and PAD presenting with aphasia, right face weakness. She did not receive IV t-PA due to delay in arrival, on xarelto.   Stroke:  left MCA territory infarcts embolic likely secondary to known atrial fibrillation. 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 To make up the story, it is likely that the thrombus formed during the time she stopped Xarelto for colonoscopy preparation. Therefore, in the future, if pt needs any procedure which requires  stop anticoagulation, neurologist or cardiologist has to be consulted in advance. Pt has been on Xarelto and ASA 81mg  but Xarelto may be under-dosed. Has changed to eliquis full dose.    Resultant  Mild broca's aphasia  MRI  L MCA territory infarcts.    MRA  Narrowing distal L M1, complete occlusion distasl L M2, L ACA poorly seen  Carotid Doppler unremarkable  2D Echo  EF 55% No source of embolus  LDL 81  HgbA1c 5.5  Pan-CT ruled out maligancy but found to have LAA thrombus.  apixaban for VTE prophylaxis  xarelto ( rivaroxaban) prior to admission, now on eliquis (apixaban). Pt may have been under dosed for Xarelto. Now on full dose eliquis. Continue ASA 81mg  for cardiac protection.  Patient counseled to be compliant with her antithrombotic medications  Ongoing aggressive stroke risk factor management  Therapy recommendations:  HH therapies recommended.   Disposition:  pending  Atrial Fibrillation  Home anticoagulation:  xarelto 15 mg - may be under dosed as pt Cre is normal  Changed from Xarelto 15mg  to Apixaban 5mg  bid - full dose   Agree with aspirin cessation from neuro standpoint  CHA2DS2-VASc Score = 7, ?2 oral anticoagulation recommended  Age in Years:  ?18   +2    Sex:  Female   Female   +1    Hypertension History:  yes   +1     Diabetes Mellitus:  0   Congestive Heart Failure History:  0  Vascular Disease History:  yes   +1     Stroke/TIA/Thromboembolism History:  yes   +2  Continue eliquis 5mg  bid at discharge   Hypertension  Stable  Hyperlipidemia  Home meds:  crestor 5, resumed in hospital  LDL 81, goal < 70  Continue statin at discharge  Other Stroke Risk Factors  Advanced age  Former Cigarette smoker, quit 16 years ago  Family hx stroke (father)  2 vessel Coronary artery disease s/p CABG - continue ASA 47  PAD  Ischemic cardiomyopathy - EF 55%  Other Active Problems  COPD  Chronic kidney disease stage III - Cre  normalized  Hospital day # 1   Neurology will sign off. Please call with questions. Pt will follow up with Dr. Roda Shutters at [facility] in about 2 months. Thanks for the consult.  Delton See PA-C [facility] Pager [phone] 11/19/2014, 1:13 PM   To contact Stroke Continuity provider, please refer to [email or web address]. After hours, contact General Neurology

## 2014-11-19 NOTE — Progress Notes (Signed)
Speech Language Pathology Treatment: Dysphagia  Patient Details Name: [name] MRN: [id number] DOB: [birth year] Today's Date: 11/19/2014 Time: 0920-0930 SLP Time Calculation (min) (ACUTE ONLY): 10 min  Assessment / Plan / Recommendation Clinical Impression  Skilled treatment session focused on addressing diet advancement.  Patient with increased arousal and ability to follow commands today.  SLP set-up regular textures and thin liquids via cup; patient consumed trials with minimal oral residue that cleared with cued cup sip.  Recommend diet advancement to Dys. 3 textures with continuation of thin liquids and full supervision.     HPI Other Pertinent Information: [name] is a 74 y.o. female who was admitted 11/17/14 due to being unable to talk as well as a right facial droop. CT head did not show anything acute, MRI pending.  Orders received for bedside swallow evaluation.   Pertinent Vitals Pain Assessment: No/denies pain   SLP Plan  Continue with current plan of care    Recommendations Diet recommendations: Dysphagia 3 (mechanical soft);Thin liquid Liquids provided via: Cup;No straw Medication Administration: Whole meds with puree Supervision: Patient able to Bear feed;Full supervision/cueing for compensatory strategies Compensations: Slow rate;Small sips/bites;Check for pocketing Postural Changes and/or Swallow Maneuvers: Seated upright 90 degrees              Oral Care Recommendations: Oral care BID Follow up Recommendations: Home health SLP Plan: Continue with current plan of care    GO    Charlane Ferretti., CCC-SLP [phone]  [name] 11/19/2014, 10:05 AM

## 2014-11-19 NOTE — Progress Notes (Signed)
Occupational Therapy Evaluation Patient Details Name: [name] MRN: [id number] DOB: [birth year] Today's Date: 11/19/2014    History of Present Illness [name] is an 74 y.o. female with a past medical history that is relevant for HTN, hyperlipidemia, CAD s.p CABG, ischemic cardiomyopathy, PAF on xarelto, and PAD, brought in by EMS due to acute onset of aphasia, right face weakness. MRI indicates Patchy ischemic nonhemorrhagic multi focal left MCA territory infarcts, and Possible additional acute left ACA territory infarcts.   Clinical Impression   Pt admitted with the above diagnoses and presents with below problem list. Pt will benefit from continued acute OT to address the below listed deficits and maximize independence with BADLs prior to d/c home with family. PTA pt was independent with ADLs. Pt presents with decreased dynamic balance, impaired cognition including decreased insight into deficits impacting ADLs. Pt is currently min guard to min A with LB ADLs and transfers. ADLs completed as detailed below. OT to continue to follow acutely.     Follow Up Recommendations  Supervision/Assistance - 24 hour;Home health OT    Equipment Recommendations  3 in 1 bedside comode;Tub/shower seat    Recommendations for Other Services       Precautions / Restrictions Precautions Precautions: Fall Restrictions Weight Bearing Restrictions: No      Mobility Bed Mobility Overal bed mobility: Modified Independent             General bed mobility comments: extra time and effort, bed rails used   Transfers Overall transfer level: Needs assistance Equipment used: Rolling walker (2 wheeled) Transfers: Sit to/from Stand Sit to Stand: Min guard         General transfer comment: min guard for safety; from EOB and regular height toilet, cues for technique with rw    Balance Overall balance assessment: Needs assistance Sitting-balance support: Bilateral upper extremity  supported;Feet supported Sitting balance-Leahy Scale: Good Sitting balance - Comments: BUE support for back pain relief   Standing balance support: Bilateral upper extremity supported;During functional activity Standing balance-Leahy Scale: Fair Standing balance comment: stood to complete simple grooming tasks with intermittent external support                            ADL Overall ADL's : Needs assistance/impaired Eating/Feeding: Set up;Sitting;Supervision/ safety Eating/Feeding Details (indicate cue type and reason): cueing for small sips Grooming: Wash/dry hands;Oral care;Brushing hair;Min guard;Standing Grooming Details (indicate cue type and reason): limited static standing time due to c/o back pain; min guard for safety Upper Body Bathing: Set up;Sitting   Lower Body Bathing: Min guard;Sit to/from stand;With adaptive equipment Lower Body Bathing Details (indicate cue type and reason): could benefit from AE due to back pain Upper Body Dressing : Set up   Lower Body Dressing: Min guard;With adaptive equipment;Sit to/from stand Lower Body Dressing Details (indicate cue type and reason): could benefit from AE due to back pain Toilet Transfer: Min guard;Ambulation;Regular Toilet;Grab bars;RW Statistician Details (indicate cue type and reason): min guard for safety Toileting- Clothing Manipulation and Hygiene: Min guard;Sit to/from stand   Tub/ Shower Transfer: Minimal assistance;Tub transfer;Ambulation;3 in 1;Rolling walker   Functional mobility during ADLs: Min guard;Rolling walker General ADL Comments: Pt completed bed mobility as detailed below. Pt ambulated in room to complete grooming and toilet transfer as detailed above. Pt with decreased dynamic balance, impaired cognition including decreased insight into deficits impacting ADLs. Pt noted to leave rw behind during pivoting.  Vision Vision Assessment?: No apparent visual deficits   Perception      Praxis      Pertinent Vitals/Pain Pain Assessment: Faces Pain Score: 10-Worst pain ever Faces Pain Scale: Hurts even more Pain Location: back (chronic) Pain Descriptors / Indicators: Spasm;Other (Comment) ("off and on") Pain Intervention(s): Limited activity within patient's tolerance;Monitored during session;Repositioned     Hand Dominance Right   Extremity/Trunk Assessment Upper Extremity Assessment Upper Extremity Assessment: Overall WFL for tasks assessed   Lower Extremity Assessment Lower Extremity Assessment: Overall WFL for tasks assessed       Communication Communication Communication: Expressive difficulties   Cognition Arousal/Alertness: Awake/alert Behavior During Therapy: WFL for tasks assessed/performed Overall Cognitive Status: Impaired/Different from baseline Area of Impairment: Safety/judgement;Problem solving;Memory Orientation Level: Disoriented to;Time (unsure of year)   Memory: Decreased short-term memory   Safety/Judgement: Decreased awareness of deficits;Decreased awareness of safety   Problem Solving: Slow processing (higher level difficulty) General Comments: word finding difficulties   General Comments       Exercises       Shoulder Instructions      Home Living Family/patient expects to be discharged to:: Private residence Living Arrangements: Children Available Help at Discharge: Family;Available 24 hours/day Type of Home: House Home Access: Level entry     Home Layout: Two level;Able to live on main level with bedroom/bathroom     Bathroom Shower/Tub: Chief Strategy Officer: Standard     Home Equipment: None      Lives With: Family;Daughter    Prior Functioning/Environment Level of Independence: Independent             OT Diagnosis: Acute pain;Cognitive deficits   OT Problem List: Decreased activity tolerance;Impaired balance (sitting and/or standing);Decreased cognition;Decreased safety  awareness;Decreased knowledge of use of DME or AE;Decreased knowledge of precautions   OT Treatment/Interventions: Allocca-care/ADL training;Therapeutic exercise;DME and/or AE instruction;Therapeutic activities;Cognitive remediation/compensation;Patient/family education;Balance training    OT Goals(Current goals can be found in the care plan section) Acute Rehab OT Goals Patient Stated Goal: Go home OT Goal Formulation: With patient/family Time For Goal Achievement: 12/03/14 Potential to Achieve Goals: Good ADL Goals Pt Will Perform Grooming: with modified independence;standing Pt Will Perform Lower Body Bathing: with modified independence;with adaptive equipment;sit to/from stand Pt Will Perform Lower Body Dressing: with modified independence;with adaptive equipment;sit to/from stand Pt Will Transfer to Toilet: with modified independence;ambulating (3n1 over toilet) Pt Will Perform Toileting - Clothing Manipulation and hygiene: with modified independence;sit to/from stand;sitting/lateral leans Pt Will Perform Tub/Shower Transfer: Tub transfer;with supervision;ambulating;shower seat;rolling walker  OT Frequency: Min 2X/week   Barriers to D/C:            Co-evaluation              End of Session Equipment Utilized During Treatment: Gait belt;Rolling walker  Activity Tolerance: Patient limited by pain;Patient tolerated treatment well Patient left: in bed;with call bell/phone within reach;with family/visitor present;Other (comment) (with MD in room)   Time: 1610-9604 OT Time Calculation (min): 23 min Charges:  OT General Charges $OT Visit: 1 Procedure OT Treatments $Stofer Care/Home Management : 8-22 mins G-Codes:    Pilar Grammes 27-Nov-2014, 11:38 AM

## 2014-11-19 NOTE — Consult Note (Signed)
       Reason for Consult: CVA   Referring Physician: Dr Vann   PCP:  SUN,YUN, MD  Primary Cardiologist:Dr. Harding  [name] is an 74 y.o. female.    Chief Complaint: admitted 11/18/14 with difficulty speaking and rt facial droop   HPI:  74 y.o. female with history of chronic atrial fibrillation, cardiomyopathy last EF measured was 40-45%, CAD status post CABG, hypertension, chronic kidney disease was brought to the patient's daughter found that patient was not able to talk and also was found to have right facial droop. As per patient's daughter patient was doing fine and was last seen normal at around 7 PM night of admit by patient's son. At around 8 PM patient's daughter came in and saw the patient was on the bed which is unusual for her she also was mildly diaphoretic. When they tried to communicate she was not able to talk and patient also was found to have right facial droop. Patient was brought to the ER and CT head did not show anything acute. On-call neurologist Dr. Camillo was consulted and patient was admitted for further management of acute stroke. Patient was recently changed to xarelto 15 mg daily from Pradaxa 2 months ago after ER visit for lt hand weakness. Lastly patient also had colonoscopy done during which xarelto was on hold 11/12/14 but was restarted again.  No tPA due to anticoagulation and out of the window.  Now on Eliquis full dose per neurology and asa 81 mg. .   She had failed swallow study and was on IV metoprolol and IV dig.  No chest pain. No SOB.  Now with improved swallowing back on po meds.  Pt though is not happy with amiodarone, it has affected eye site and thyroid.  She is worried it will affect her liver.  Dr. Harding had thought to decrease and stop prior to her neck surgery but wanted her on through surgery.  She has had rapid a fib in Jan of this year off the dig.  It was restarted with improved control.  Dig level now <0.2.   Pt states she has not  felt well since her CABG, she has chronic pain from neck and neuropathy of lower ext. At times.    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 Last nuc study was sept 2015, neg for ischemia.  Last cath prior to CABG in 2009.    Carotid dopplers 1-39% ICA stenosis.   Echo this admit: Study Conclusions - Left ventricle: The cavity size was normal. Wall thickness was normal. Septal bounce s/p cardiac surgery. Indeterminant diastolic function (atrial fibrillation). Systolic function was normal. The estimated ejection fraction was 55%. Wall motion was normal; there were no regional wall motion abnormalities. - Aortic valve: There was no stenosis. - Mitral valve: There was mild regurgitation. - Left atrium: The atrium was severely dilated. - Right ventricle: The cavity size was normal. Systolic function was normal. - Right atrium: The atrium was moderately dilated. - Tricuspid valve: There was moderate regurgitation. Peak RV-RA gradient (S): 48 mm Hg. - Pulmonary arteries: PA peak pressure: 56 mm Hg (S). - Systemic veins: IVC measured 1.9 cm with < 50% respirophasic variation, suggesting RA pressure 8 mmHg. Impressions: - The patient was in atrial fibrillation. Normal LV size with EF 55%. Septal bounce consistent with prior cardiac   surgery. Normal RV size and systolic function. Biatrial enlargement. Moderate TR. Moderate pulmonary hypertension. Mild MR.  (Previous 2012 EF on 40-45%, mod TR, lt atrium mildly dilated, mild MR, PA pk pressure 48)   EKG:  HR 90 Atrial fibrillation with premature ventricular or aberrantly conducted complexes Left axis deviation Incomplete right bundle branch block Anteroseptal infarct , age undetermined Abnormal ECG  No acute changes  Past Medical History  Diagnosis Date  . S/P CABG x 2 2009    LIMA-LAD, SVG-RCA, with AV  fistula ligation and Maze procedure  . CAD (coronary artery disease), native coronary artery 2009    75% LAD, diffuse 75-90% RCA --> Referred for CABG + MAZE;  Cardiolite 12/2013: No ischemia or Infarction  . Ischemic cardiomyopathy 2012    EF ~40-45% by Echo  . PAF (paroxysmal atrial fibrillation) 04/05/2012    s/p MAZE -- recurrence, cardioversion-converted to sinus bradycardia --> Now persistent despite being on Amiodarone & Pradaxa  . Essential hypertension   . Dyslipidemia, goal LDL below 70     On Crestor, followed by PCP  . COPD (chronic obstructive pulmonary disease)   . PAD (peripheral artery disease) 2007    s/p L Ileac A stent ; most recent Dopplers July 2012: Less than 50% reduction bilaterally. ABI 0.96 on the right 0.88 on left;;;12/27/2011   -ABI right .87 and left ABI .78  ,LEFT CIA and EIA stent normall patency, left CFA,SFA,and popliteal 0-49%; rgt proximal SFA 50-69%,rgt CIA,EIA, and CFA 0-49%  . Chronic low back pain     scoliosis & lordosis  . H/O: pneumonia   . Hypothyroidism   . Anxiety   . Cervical neck pain with evidence of disc disease     with need for surgery -- November 2015  . Osteoarthritis of back     And neck, hands,spine    Past Surgical History  Procedure Laterality Date  . Iliac artery stent Left 04/06/2005    Ex Iliac - CFA (Smart STENTS -- 7x4 in EIA, 6 x 3 CFA)   . Coronary artery bypass graft  2009    LIMA-LAD, SVG-RCA (also MAZE & AV fistula ligation)  . Maze  2009    along with CABG  . Cardioversion  04/05/2012    Procedure: CARDIOVERSION;  Surgeon: Mihai Croitoru, MD;  Location: [facility];  Service: Cardiovascular;  Laterality: N/A;  . Transthoracic echocardiogram  Jan 2012    EF ~40-45%, global HK; PAP ~45-50 mmHg  . Nm myoview ltd  May 2013    No ischemia or Infarct  . Abdominal hysterectomy    . Cardiac catheterization  07/30/2007    75% LAD ,3 stenoses of 75-90% in  RCA;circumflex from proximal RCA with no lesion seen,normal ramus  intermediate branh; normal LV systoilc function  . Nm cardiolite ltd  12/2013    Non-gated for Afib; No Ischemia or Infarction.  . Anterior cervical decomp/discectomy fusion N/A 01/23/2014    Procedure: ANTERIOR CERVICAL DECOMPRESSION/DISCECTOMY FUSION CERVICAL FOUR-FIVE,CERVICAL FIVE-SIX,CERVICAL SIX-SEVEN;  Surgeon: Gary P Cram, MD;  Location: [facility];  Service: Neurosurgery;  Laterality: N/A;  . Colonoscopy with propofol N/A 11/12/2014    Procedure: COLONOSCOPY WITH PROPOFOL;  Surgeon: Jyothi Mann, MD;  Location: [facility];  Service: Endoscopy;  Laterality: N/A;    Family History  Problem Relation Age of Onset  . Heart attack Mother   . Stroke Father   . Heart disease Brother 60   Social History:  reports that she quit smoking about 16   years ago. Her smoking use included Cigarettes. She smoked 0.50 packs per day. She has never used smokeless tobacco. She reports that she does not drink alcohol or use illicit drugs.  Allergies: No Known Allergies  OUTPATIENT MEDICATIONS: No current facility-administered medications on file prior to encounter.   Current Outpatient Prescriptions on File Prior to Encounter  Medication Sig Dispense Refill  . amiodarone (PACERONE) 200 MG tablet TAKE 1 TABLET (200 MG TOTAL) BY MOUTH DAILY AS DIRECTED. (Patient taking differently: TAKE 1 TABLET (200 MG TOTAL) BY MOUTH DAILY AT BEDTIME) 30 tablet 11  . Cholecalciferol (VITAMIN D) 2000 UNITS CAPS Take 2,000 Units by mouth daily.    . CRESTOR 5 MG tablet TAKE 1 TABLET BY MOUTH IN THE EVENING 30 tablet 8  . diazepam (VALIUM) 2 MG tablet Take 1 tablet (2 mg total) by mouth every 8 (eight) hours as needed for muscle spasms. (Patient taking differently: Take 2 mg by mouth at bedtime. ) 30 tablet 0  . gabapentin (NEURONTIN) 300 MG capsule Take 300 mg by mouth at bedtime.    . LANOXIN 62.5 MCG TABS TAKE 1/2 TABLET (31.25MCG) BY MOUTH DAILY (Patient taking differently: TAKE 1/2 TABLET (31.25MCG) BY MOUTH EVERY  OTHER DAY) 15 tablet 10  . levothyroxine (SYNTHROID, LEVOTHROID) 75 MCG tablet Take 75 mcg by mouth daily before breakfast.    . metoprolol (LOPRESSOR) 50 MG tablet Take 0.5 tablets (25 mg total) by mouth 2 (two) times daily. 60 tablet 6  . Rivaroxaban (XARELTO) 15 MG TABS tablet Take 1 tablet by mouth daily with a meal.  Do not take any more Pradaxa 30 tablet 5  . docusate sodium (COLACE) 100 MG capsule Take 1 capsule (100 mg total) by mouth every 12 (twelve) hours. (Patient not taking: Reported on 10/22/2014) 60 capsule 0   CURRENT MEDICATIONS: Scheduled Meds: . amiodarone  200 mg Oral QHS  . apixaban  5 mg Oral BID  . aspirin EC  81 mg Oral Daily  . digoxin  0.0313 mg Oral Daily  . feeding supplement (ENSURE ENLIVE)  237 mL Oral BID BM  . gabapentin  300 mg Oral QHS  . levothyroxine  75 mcg Oral QAC breakfast  . metoprolol  25 mg Oral BID  . rosuvastatin  5 mg Oral QPM   Continuous Infusions: . sodium chloride     PRN Meds:.acetaminophen, diazepam, senna-docusate   Results for orders placed or performed during the hospital encounter of 11/18/14 (from the past 48 hour(s))  CBG monitoring, ED     Status: Abnormal   Collection Time: 11/17/14 11:13 PM  Result Value Ref Range   Glucose-Capillary 114 (H) 65 - 99 mg/dL  Protime-INR     Status: Abnormal   Collection Time: 11/17/14 11:15 PM  Result Value Ref Range   Prothrombin Time 21.4 (H) 11.6 - 15.2 seconds   INR 1.87 (H) 0.00 - 1.49  APTT     Status: Abnormal   Collection Time: 11/17/14 11:15 PM  Result Value Ref Range   aPTT 46 (H) 24 - 37 seconds    Comment:        IF BASELINE aPTT IS ELEVATED, SUGGEST PATIENT RISK ASSESSMENT BE USED TO DETERMINE APPROPRIATE ANTICOAGULANT THERAPY.   CBC     Status: Abnormal   Collection Time: 11/17/14 11:15 PM  Result Value Ref Range   WBC 5.4 4.0 - 10.5 K/uL   RBC 5.44 (H) 3.87 - 5.11 MIL/uL   Hemoglobin 12.2 12.0 - 15.0 g/dL     HCT 41.0 36.0 - 46.0 %   MCV 75.4 (L) 78.0 - 100.0 fL     MCH 22.4 (L) 26.0 - 34.0 pg   MCHC 29.8 (L) 30.0 - 36.0 g/dL   RDW 16.8 (H) 11.5 - 15.5 %   Platelets 176 150 - 400 K/uL  Differential     Status: None   Collection Time: 11/17/14 11:15 PM  Result Value Ref Range   Neutrophils Relative % 74 43 - 77 %   Neutro Abs 4.0 1.7 - 7.7 K/uL   Lymphocytes Relative 16 12 - 46 %   Lymphs Abs 0.8 0.7 - 4.0 K/uL   Monocytes Relative 9 3 - 12 %   Monocytes Absolute 0.5 0.1 - 1.0 K/uL   Eosinophils Relative 2 0 - 5 %   Eosinophils Absolute 0.1 0.0 - 0.7 K/uL   Basophils Relative 1 0 - 1 %   Basophils Absolute 0.0 0.0 - 0.1 K/uL  Comprehensive metabolic panel     Status: Abnormal   Collection Time: 11/17/14 11:15 PM  Result Value Ref Range   Sodium 138 135 - 145 mmol/L   Potassium 4.3 3.5 - 5.1 mmol/L   Chloride 100 (L) 101 - 111 mmol/L   CO2 29 22 - 32 mmol/L   Glucose, Bld 115 (H) 65 - 99 mg/dL   BUN 16 6 - 20 mg/dL   Creatinine, Ser 1.14 (H) 0.44 - 1.00 mg/dL   Calcium 9.4 8.9 - 10.3 mg/dL   Total Protein 7.2 6.5 - 8.1 g/dL   Albumin 4.4 3.5 - 5.0 g/dL   AST 42 (H) 15 - 41 U/L   ALT 35 14 - 54 U/L   Alkaline Phosphatase 66 38 - 126 U/L   Total Bilirubin 0.5 0.3 - 1.2 mg/dL   GFR calc non Af Amer 46 (L) >60 mL/min   GFR calc Af Amer 53 (L) >60 mL/min    Comment: (NOTE) The eGFR has been calculated using the CKD EPI equation. This calculation has not been validated in all clinical situations. eGFR's persistently <60 mL/min signify possible Chronic Kidney Disease.    Anion gap 9 5 - 15  Ethanol     Status: None   Collection Time: 11/17/14 11:15 PM  Result Value Ref Range   Alcohol, Ethyl (B) <5 <5 mg/dL    Comment:        LOWEST DETECTABLE LIMIT FOR SERUM ALCOHOL IS 5 mg/dL FOR MEDICAL PURPOSES ONLY   Hemoglobin A1c     Status: None   Collection Time: 11/17/14 11:15 PM  Result Value Ref Range   Hgb A1c MFr Bld 5.5 4.8 - 5.6 %    Comment: (NOTE)         Pre-diabetes: 5.7 - 6.4         Diabetes: >6.4         Glycemic  control for adults with diabetes: <7.0    Mean Plasma Glucose 111 mg/dL    Comment: (NOTE) Performed At: [facility] [address] [place], [place] [id number] [place] William F MD Ph:[phone]   I-stat troponin, ED (not at [facility], [facility])     Status: None   Collection Time: 11/17/14 11:21 PM  Result Value Ref Range   Troponin i, poc 0.00 0.00 - 0.08 ng/mL   Comment 3            Comment: Due to the release kinetics of cTnI, a negative result within the first hours of the onset of symptoms does   not rule out myocardial infarction with certainty. If myocardial infarction is still suspected, repeat the test at appropriate intervals.   I-Stat Chem 8, ED  (not at [facility], [facility])     Status: Abnormal   Collection Time: 11/17/14 11:21 PM  Result Value Ref Range   Sodium 139 135 - 145 mmol/L   Potassium 4.5 3.5 - 5.1 mmol/L   Chloride 99 (L) 101 - 111 mmol/L   BUN 25 (H) 6 - 20 mg/dL   Creatinine, Ser 1.20 (H) 0.44 - 1.00 mg/dL   Glucose, Bld 111 (H) 65 - 99 mg/dL   Calcium, Ion 1.15 1.13 - 1.30 mmol/L   TCO2 29 0 - 100 mmol/L   Hemoglobin 14.6 12.0 - 15.0 g/dL   HCT 43.0 36.0 - 46.0 %  CBG monitoring, ED     Status: Abnormal   Collection Time: 11/18/14  1:56 AM  Result Value Ref Range   Glucose-Capillary 110 (H) 65 - 99 mg/dL  Digoxin level     Status: Abnormal   Collection Time: 11/18/14  6:38 AM  Result Value Ref Range   Digoxin Level <0.2 (L) 0.8 - 2.0 ng/mL    Comment: RESULTS CONFIRMED BY MANUAL DILUTION  Lipid panel     Status: None   Collection Time: 11/18/14  6:38 AM  Result Value Ref Range   Cholesterol 170 0 - 200 mg/dL   Triglycerides 57 <150 mg/dL   HDL 78 >40 mg/dL   Total CHOL/HDL Ratio 2.2 RATIO   VLDL 11 0 - 40 mg/dL   LDL Cholesterol 81 0 - 99 mg/dL    Comment:        Total Cholesterol/HDL:CHD Risk Coronary Heart Disease Risk Table                     Men   Women  1/2 Average Risk   3.4   3.3  Average Risk       5.0   4.4  2 X Average Risk   9.6    7.1  3 X Average Risk  23.4   11.0        Use the calculated Patient Ratio above and the CHD Risk Table to determine the patient's CHD Risk.        ATP III CLASSIFICATION (LDL):  <100     mg/dL   Optimal  100-129  mg/dL   Near or Above                    Optimal  130-159  mg/dL   Borderline  160-189  mg/dL   High  >190     mg/dL   Very High   Comprehensive metabolic panel     Status: Abnormal   Collection Time: 11/18/14  6:38 AM  Result Value Ref Range   Sodium 141 135 - 145 mmol/L   Potassium 4.7 3.5 - 5.1 mmol/L   Chloride 102 101 - 111 mmol/L   CO2 28 22 - 32 mmol/L   Glucose, Bld 147 (H) 65 - 99 mg/dL   BUN 17 6 - 20 mg/dL   Creatinine, Ser 0.98 0.44 - 1.00 mg/dL   Calcium 9.3 8.9 - 10.3 mg/dL   Total Protein 6.9 6.5 - 8.1 g/dL   Albumin 4.4 3.5 - 5.0 g/dL   AST 41 15 - 41 U/L   ALT 34 14 - 54 U/L   Alkaline Phosphatase 63 38 - 126 U/L   Total Bilirubin 0.8 0.3 -  1.2 mg/dL   GFR calc non Af Amer 55 (L) >60 mL/min   GFR calc Af Amer >60 >60 mL/min    Comment: (NOTE) The eGFR has been calculated using the CKD EPI equation. This calculation has not been validated in all clinical situations. eGFR's persistently <60 mL/min signify possible Chronic Kidney Disease.    Anion gap 11 5 - 15  CBC     Status: Abnormal   Collection Time: 11/18/14  6:38 AM  Result Value Ref Range   WBC 7.5 4.0 - 10.5 K/uL   RBC 5.49 (H) 3.87 - 5.11 MIL/uL   Hemoglobin 12.2 12.0 - 15.0 g/dL   HCT 41.1 36.0 - 46.0 %   MCV 74.9 (L) 78.0 - 100.0 fL   MCH 22.2 (L) 26.0 - 34.0 pg   MCHC 29.7 (L) 30.0 - 36.0 g/dL   RDW 16.5 (H) 11.5 - 15.5 %   Platelets 198 150 - 400 K/uL  Glucose, capillary     Status: Abnormal   Collection Time: 11/18/14  6:41 AM  Result Value Ref Range   Glucose-Capillary 144 (H) 65 - 99 mg/dL   Comment 1 Notify RN    Comment 2 Document in Chart   Glucose, capillary     Status: Abnormal   Collection Time: 11/18/14 11:52 AM  Result Value Ref Range   Glucose-Capillary  108 (H) 65 - 99 mg/dL  Glucose, capillary     Status: None   Collection Time: 11/18/14  9:53 PM  Result Value Ref Range   Glucose-Capillary 93 65 - 99 mg/dL   Comment 1 Notify RN    Comment 2 Document in Chart   Lipid panel     Status: None   Collection Time: 11/19/14  6:15 AM  Result Value Ref Range   Cholesterol 138 0 - 200 mg/dL   Triglycerides 49 <150 mg/dL   HDL 65 >40 mg/dL   Total CHOL/HDL Ratio 2.1 RATIO   VLDL 10 0 - 40 mg/dL   LDL Cholesterol 63 0 - 99 mg/dL    Comment:        Total Cholesterol/HDL:CHD Risk Coronary Heart Disease Risk Table                     Men   Women  1/2 Average Risk   3.4   3.3  Average Risk       5.0   4.4  2 X Average Risk   9.6   7.1  3 X Average Risk  23.4   11.0        Use the calculated Patient Ratio above and the CHD Risk Table to determine the patient's CHD Risk.        ATP III CLASSIFICATION (LDL):  <100     mg/dL   Optimal  100-129  mg/dL   Near or Above                    Optimal  130-159  mg/dL   Borderline  160-189  mg/dL   High  >190     mg/dL   Very High    Dg Chest 2 View  11/18/2014   CLINICAL DATA:  CVA.  Altered mental status  EXAM: CHEST  2 VIEW  COMPARISON:  November 03, 2014 pain and April 27, 2014  FINDINGS: Lungs are somewhat hyperexpanded with areas of scarring and peribronchial thickening present. A degree of chronic bronchitis is felt to be present. The interstitium remains   diffusely prominent without new opacity. No frank edema or consolidation. The heart is upper normal in size with pulmonary vascularity within normal limits. No adenopathy. Patient is status post coronary artery bypass grafting. There is postoperative change in the lower cervical spine. No adenopathy. There is atherosclerotic change in the aorta. There is stable mild anterior wedging of mid and lower thoracic vertebral bodies.  IMPRESSION: Changes indicative of chronic bronchitis. Generalized interstitial prominence is stable. No new opacity. It  should be noted that mild chronic congestive heart failure cannot be excluded in this circumstance. No airspace consolidation. No change in cardiac silhouette.   Electronically Signed   By: Lowella Grip III M.D.   On: 11/18/2014 08:07   Ct Head Wo Contrast  11/18/2014   CLINICAL DATA:  Code stroke. Left-sided facial droop. Initial encounter.  EXAM: CT HEAD WITHOUT CONTRAST  TECHNIQUE: Contiguous axial images were obtained from the base of the skull through the vertex without intravenous contrast.  COMPARISON:  None.  FINDINGS: There is no evidence of acute infarction, mass lesion, or intra- or extra-axial hemorrhage on CT.  Scattered periventricular and subcortical white matter change likely reflects small vessel ischemic microangiopathy. A small chronic infarct is noted at the right basal ganglia.  The posterior fossa, including the cerebellum, brainstem and fourth ventricle, is within normal limits. The third and lateral ventricles are unremarkable in appearance. The cerebral hemispheres demonstrate grossly normal gray-white differentiation. No mass effect or midline shift is seen.  There is no evidence of fracture; visualized osseous structures are unremarkable in appearance. The orbits are within normal limits. The paranasal sinuses and mastoid air cells are well-aerated. No significant soft tissue abnormalities are seen.  IMPRESSION: 1. No acute intracranial pathology seen on CT. 2. Scattered small vessel ischemic microangiopathy. 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  These results were called by telephone at the time of interpretation on 11/18/2014 at 12:36 am to Dr. Armida Sans, who verbally acknowledged these results.   Electronically Signed   By: Garald Balding M.D.   On: 11/18/2014 00:38   Ct Chest W Contrast  11/18/2014   CLINICAL DATA:  Unexplained weight loss.  EXAM: CT CHEST, ABDOMEN, AND PELVIS WITH CONTRAST  TECHNIQUE: Multidetector CT imaging of the chest, abdomen and pelvis was  performed following the standard protocol during bolus administration of intravenous contrast.  CONTRAST:  155m OMNIPAQUE IOHEXOL 300 MG/ML  SOLN  COMPARISON:  None.  Chest x-ray dated 11/18/2014, CT scan abdomen dated 06/13/2014 and CT scan of the chest dated 04/21/2010  FINDINGS: CT CHEST FINDINGS  There are several small nodules in the thyroid gland as well as a dense 7 mm calcification in the left lobe, all unchanged. The patient has emphysema primarily involving the upper lobes. There is chronic scarring in the medial aspects of the lingula and right middle lobe, less prominent than on the prior exam. Chronic cardiomegaly particularly of the right atrium. Previous CABG. Extensive aortic atherosclerosis. No hilar or mediastinal adenopathy. There appears to be a thrombus in the left atrial appendage best seen on images 29 and 30 of series 2.  CT ABDOMEN AND PELVIS FINDINGS  Hepatobiliary: There is marked increase in the density of the liver parenchyma with an unusual enhancement pattern on the arterial phase images. The patient is on amiodarone which can cause hepatotoxicity. Biliary tree is normal.  Pancreas: Normal.  Spleen: Normal.  Adrenals/Urinary Tract: 17 mm cyst on the upper pole of the left kidney. 6 mm cyst on the  lower pole. Several tiny cysts in both kidneys. No hydronephrosis. The left kidney is inferiorly and medially displaced by the spleen due to hyperinflation of the lungs due to emphysema.  Stomach/Bowel: Chronic prominence of the mucosa of the stomach. The bowel is normal including the terminal ileum and appendix. Large amount of stool in the rectum.  Vascular/Lymphatic: Extensive arterial calcification in the abdomen.  Other: No free air or free fluid.  Musculoskeletal: No acute osseous abnormality.  IMPRESSION: 1. There appears to be a thrombus in the left atrial appendage. Chronic cardiomegaly with particular enlargement of the right atrium and right ventricle. 2. Emphysema. 3. Large amount  of stool in the rectum which could represent a fecal impaction. 4. Extensive atherosclerosis. 5. Amiodarone hepatotoxicity.   Electronically Signed   By: James  Maxwell M.D.   On: 11/18/2014 20:09   Mr Mra Head Wo Contrast  11/18/2014   CLINICAL DATA:  Initial evaluation for acute aphasia, right facial weakness.  EXAM: MRI HEAD WITHOUT CONTRAST  MRA HEAD WITHOUT CONTRAST  TECHNIQUE: Multiplanar, multiecho pulse sequences of the brain and surrounding structures were obtained without intravenous contrast. Angiographic images of the head were obtained using MRA technique without contrast.  COMPARISON:  Prior CT from earlier the same day.  FINDINGS: MRI HEAD FINDINGS  Generalized age-related cerebral atrophy present. Patchy T2/FLAIR hyperintensity within the periventricular white matter most consistent with chronic small vessel ischemic disease. T2 hyperintense lesion at the inferior right basal ganglia noted, likely dilated perivascular spaces this suppresses on FLAIR sequence.  Patchy restricted diffusion present within the cortical gray matter and underlying white matter of the left frontal lobe, compatible with acute left MCA territory infarct. Small focus of restricted diffusion also present within the postcentral gyrus of the the left parietal lobe (series 3, image 38). No associated hemorrhage or mass effect. There is question of additional patchy restricted diffusion within the medial left frontal lobe (series 3, image 32), more in the left ACA distribution. No right-sided ischemic infarcts. No infratentorial infarct. Normal intravascular flow voids are maintained.  No mass lesion, mass effect, or midline shift. No hydrocephalus. No extra-axial fluid collection.  Craniocervical junction within normal limits. Fixation hardware noted within the upper cervical spine. Small degenerative disc bulge present at C3-4.  Pituitary gland normal. No acute abnormality about the orbits. Scattered mucosal thickening present  within the ethmoidal air cells. Paranasal sinuses are otherwise grossly clear. Minimal opacity present within the inferior mastoid air cells bilaterally. Inner ear structures normal.  Bone marrow signal intensity within normal limits. Scalp soft tissues unremarkable.  MRA HEAD FINDINGS  ANTERIOR CIRCULATION:  Study is degraded by motion artifact.  Distal cervical segments of the internal carotid arteries are not well evaluated on this exam. Petrous segments patent bilaterally. Cavernous and supraclinoid segments widely patent without definite high-grade stenosis. A1 segments patent bilaterally. The left anterior S are well artery is markedly diminutive as compared to the right, and not well evaluated on this exam.  The distal left M1 segment is narrowed and attenuated in appearance, which may related this stenosis and/or occlusive thrombus. The left middle cerebral artery appears to be, a completely occluded at the proximal left M2 segment at the base of the sylvian fissure (series 6, image 91). Left MCA branches are essentially absent distally.  Right M1 segment widely patent without stenosis or occlusion. Right MCA bifurcation within normal limits. Distal right MCA branches well opacified.  POSTERIOR CIRCULATION:  Vertebral arteries are grossly patent to the vertebrobasilar junction.   The posterior inferior cerebral arteries are not well evaluated on this exam. Basilar artery patent. Superior cerebellar arteries not well visualized. Posterior cerebral arteries grossly patent without definite stenosis or occlusion.  No definite aneurysm or vascular malformation.  IMPRESSION: MRI HEAD IMPRESSION:  1. Patchy ischemic nonhemorrhagic multi focal left MCA territory infarcts as above. No significant mass effect. 2. Possible additional acute left ACA territory infarcts within the parasagittal anterior left frontal lobe. No associated hemorrhage or mass effect. 3. Age-related cerebral atrophy with mild chronic microvascular  ischemic disease.  MRA HEAD IMPRESSION:  1. Motion degraded study. 2. Narrowing with attenuation of the distal left M1 segment, which may be related to stenosis and/or occlusive thrombus. There is complete occlusion of the left MCA just distally at the M2 level within the proximal left sylvian fissure. Left MCA branches are absent. 3. Markedly attenuated left anterior cerebral artery, poorly evaluated on this motion degraded exam. This may be related to stenosis and/or partially occlusive thrombus and/or hypoplasia. Further evaluation with dedicated CTA of the brain may be helpful for further characterization due to more rapid imaging time as clinically desired. 4. Grossly patent vertebrobasilar circulation.   Electronically Signed   By: Jeannine Boga M.D.   On: 11/18/2014 04:11   [name] Contrast  11/18/2014   CLINICAL DATA:  Initial evaluation for acute aphasia, right facial weakness.  EXAM: MRI HEAD WITHOUT CONTRAST  MRA HEAD WITHOUT CONTRAST  TECHNIQUE: Multiplanar, multiecho pulse sequences of the brain and surrounding structures were obtained without intravenous contrast. Angiographic images of the head were obtained using MRA technique without contrast.  COMPARISON:  Prior CT from earlier the same day.  FINDINGS: MRI HEAD FINDINGS  Generalized age-related cerebral atrophy present. Patchy T2/FLAIR hyperintensity within the periventricular white matter most consistent with chronic small vessel ischemic disease. T2 hyperintense lesion at the inferior right basal ganglia noted, likely dilated perivascular spaces this suppresses on FLAIR sequence.  Patchy restricted diffusion present within the cortical gray matter and underlying white matter of the left frontal lobe, compatible with acute left MCA territory infarct. Small focus of restricted diffusion also present within the postcentral gyrus of the the left parietal lobe (series 3, image 38). No associated hemorrhage or mass effect. There is  question of additional patchy restricted diffusion within the medial left frontal lobe (series 3, image 32), more in the left ACA distribution. No right-sided ischemic infarcts. No infratentorial infarct. Normal intravascular flow voids are maintained.  No mass lesion, mass effect, or midline shift. No hydrocephalus. No extra-axial fluid collection.  Craniocervical junction within normal limits. Fixation hardware noted within the upper cervical spine. Small degenerative disc bulge present at C3-4.  Pituitary gland normal. No acute abnormality about the orbits. Scattered mucosal thickening present within the ethmoidal air cells. Paranasal sinuses are otherwise grossly clear. Minimal opacity present within the inferior mastoid air cells bilaterally. Inner ear structures normal.  Bone marrow signal intensity within normal limits. Scalp soft tissues unremarkable.  MRA HEAD FINDINGS  ANTERIOR CIRCULATION:  Study is degraded by motion artifact.  Distal cervical segments of the internal carotid arteries are not well evaluated on this exam. Petrous segments patent bilaterally. Cavernous and supraclinoid segments widely patent without definite high-grade stenosis. A1 segments patent bilaterally. The left anterior S are well artery is markedly diminutive as compared to the right, and not well evaluated on this exam.  The distal left M1 segment is narrowed and attenuated in appearance, which may related this stenosis and/or occlusive thrombus.  The left middle cerebral artery appears to be, a completely occluded at the proximal left M2 segment at the base of the sylvian fissure (series 6, image 91). Left MCA branches are essentially absent distally.  Right M1 segment widely patent without stenosis or occlusion. Right MCA bifurcation within normal limits. Distal right MCA branches well opacified.  POSTERIOR CIRCULATION:  Vertebral arteries are grossly patent to the vertebrobasilar junction. The posterior inferior cerebral  arteries are not well evaluated on this exam. Basilar artery patent. Superior cerebellar arteries not well visualized. Posterior cerebral arteries grossly patent without definite stenosis or occlusion.  No definite aneurysm or vascular malformation.  IMPRESSION: MRI HEAD IMPRESSION:  1. Patchy ischemic nonhemorrhagic multi focal left MCA territory infarcts as above. No significant mass effect. 2. Possible additional acute left ACA territory infarcts within the parasagittal anterior left frontal lobe. No associated hemorrhage or mass effect. 3. Age-related cerebral atrophy with mild chronic microvascular ischemic disease.  MRA HEAD IMPRESSION:  1. Motion degraded study. 2. Narrowing with attenuation of the distal left M1 segment, which may be related to stenosis and/or occlusive thrombus. There is complete occlusion of the left MCA just distally at the M2 level within the proximal left sylvian fissure. Left MCA branches are absent. 3. Markedly attenuated left anterior cerebral artery, poorly evaluated on this motion degraded exam. This may be related to stenosis and/or partially occlusive thrombus and/or hypoplasia. Further evaluation with dedicated CTA of the brain may be helpful for further characterization due to more rapid imaging time as clinically desired. 4. Grossly patent vertebrobasilar circulation.   Electronically Signed   By: Benjamin  McClintock M.D.   On: 11/18/2014 04:11   Ct Abdomen Pelvis W Contrast  11/18/2014   CLINICAL DATA:  Unexplained weight loss.  EXAM: CT CHEST, ABDOMEN, AND PELVIS WITH CONTRAST  TECHNIQUE: Multidetector CT imaging of the chest, abdomen and pelvis was performed following the standard protocol during bolus administration of intravenous contrast.  CONTRAST:  100mL OMNIPAQUE IOHEXOL 300 MG/ML  SOLN  COMPARISON:  None.  Chest x-ray dated 11/18/2014, CT scan abdomen dated 06/13/2014 and CT scan of the chest dated 04/21/2010  FINDINGS: CT CHEST FINDINGS  There are several small  nodules in the thyroid gland as well as a dense 7 mm calcification in the left lobe, all unchanged. The patient has emphysema primarily involving the upper lobes. There is chronic scarring in the medial aspects of the lingula and right middle lobe, less prominent than on the prior exam. Chronic cardiomegaly particularly of the right atrium. Previous CABG. Extensive aortic atherosclerosis. No hilar or mediastinal adenopathy. There appears to be a thrombus in the left atrial appendage best seen on images 29 and 30 of series 2.  CT ABDOMEN AND PELVIS FINDINGS  Hepatobiliary: There is marked increase in the density of the liver parenchyma with an unusual enhancement pattern on the arterial phase images. The patient is on amiodarone which can cause hepatotoxicity. Biliary tree is normal.  Pancreas: Normal.  Spleen: Normal.  Adrenals/Urinary Tract: 17 mm cyst on the upper pole of the left kidney. 6 mm cyst on the lower pole. Several tiny cysts in both kidneys. No hydronephrosis. The left kidney is inferiorly and medially displaced by the spleen due to hyperinflation of the lungs due to emphysema.  Stomach/Bowel: Chronic prominence of the mucosa of the stomach. The bowel is normal including the terminal ileum and appendix. Large amount of stool in the rectum.  Vascular/Lymphatic: Extensive arterial calcification in the abdomen.  Other: No   free air or free fluid.  Musculoskeletal: No acute osseous abnormality.  IMPRESSION: 1. There appears to be a thrombus in the left atrial appendage. Chronic cardiomegaly with particular enlargement of the right atrium and right ventricle. 2. Emphysema. 3. Large amount of stool in the rectum which could represent a fecal impaction. 4. Extensive atherosclerosis. 5. Amiodarone hepatotoxicity.   Electronically Signed   By: James  Maxwell M.D.   On: 11/18/2014 20:09    ROS:General:no colds or fevers, no weight changes Skin:no rashes or ulcers HEENT:no blurred vision, no congestion, does  not see well even post cataract removal CV:see HPI PUL:see HPI GI:no diarrhea constipation or melena, no indigestion GU:no hematuria, no dysuria MS:no joint pain, no claudication Neuro:no syncope, no lightheadedness Endo:no diabetes, no thyroid disease  Blood pressure 122/69, pulse 81, temperature 98 F (36.7 C), temperature source Oral, resp. rate 20, height 4' 10" (1.473 m), weight 88 lb 8 oz (40.143 kg), SpO2 100 %.  Wt Readings from Last 3 Encounters:  11/17/14 88 lb 8 oz (40.143 kg)  11/12/14 83 lb (37.649 kg)  11/03/14 87 lb 6.4 oz (39.644 kg)    PE: General:Pleasant affect, NAD Skin:Warm and dry, brisk capillary refill HEENT:normocephalic, sclera clear, mucus membranes moist, slight droop on rt Neck:supple, no JVD, no bruits  Heart:irreg irreg without murmur, gallup, rub or click Lungs:clear without rales, rhonchi, or wheezes Abd:soft, non tender, + BS, do not palpate liver spleen or masses Ext:no lower ext edema, 2+ pedal pulses, 2+ radial pulses Neuro:alert and oriented- though difficulty putting words together or thoughts at times.  Speech has improved, MAE, follows commands  Tele:  A fib mostly rate controlled up to 118 at times  Assessment/Plan Principal Problem:   Stroke-per neuro and IM, improving   Active Problems:   2 Vessel CAD - s/p CABG; LIMA-LAD, SVG-RCA  Continue ASA for grafts.  No angina and neg troponin.   Chronic atrial fibrillation- now on eliquis, was on lower dose of xarelto continue eliquis - pt would like to come off amiodarone if possible-- rate control has been an issue--stop amiodarone and increase BB prior to discharge.     Essential hypertension- controlled    Ischemic cardiomyopathy- EF improved from 45-50 to 55%   S/P CABG x 2- 2009   CKD (chronic kidney disease) stage 3, GFR 30-59 ml/min--her is mostly 55   Stroke with cerebral ischemia   Weight loss   HLD (hyperlipidemia)- LDL 81 now on crestor 5 mg- continue    INGOLD,LAURA R   Nurse Practitioner Certified [facility] Pager [phone] or after 5pm or weekends call [phone] 11/19/2014, 11:06 AM    I have seen and examined the patient along with INGOLD,LAURA R , NP.  I have reviewed the chart, notes and new data.  I agree with NP's note.  Key new complaints: improving neuro deficits Key examination changes: minimal facial asymmetry, ventricular rate 80s  PLAN: Interruption of Xarelto for the colonoscopy is the most likely substrate for embolic CVA, but I have no problem switching to Eliquis. Although bleeding risk is higher, favor continuing ASA at least in the short term and at low dose. Amiodarone is only serving as a rate control drug at this point and there are less toxic alternatives. Stop amiodarone and will likely need to adjust beta blocker dose gradually as the amiodarone effect gradually resolves over the next 4-6 months.  Mihai Croitoru, MD, FACC [facility] [phone] 11/19/2014, 2:14 PM  

## 2014-11-19 NOTE — Evaluation (Signed)
Speech Language Pathology Evaluation Patient Details Name: [name] MRN: [id number] DOB: June 03, 1940 Today's Date: 11/19/2014 Time: 0930-1000 SLP Time Calculation (min) (ACUTE ONLY): 30 min  Problem List:  Patient Active Problem List   Diagnosis Date Noted  . Stroke 11/18/2014  . CKD (chronic kidney disease) stage 3, GFR 30-59 ml/min 11/18/2014  . Stroke with cerebral ischemia   . Weight loss   . HLD (hyperlipidemia)   . Spinal stenosis in cervical region 01/23/2014  . Preoperative cardiovascular examination 01/13/2014  . Ischemic cardiomyopathy   . PAD (peripheral artery disease)   . S/P CABG x 2   . Nausea and vomiting 11/29/2011  . 2 Vessel CAD - s/p CABG; LIMA-LAD, SVG-RCA 11/29/2011  . Chronic atrial fibrillation 11/29/2011  . Hyperlipidemia with target LDL less than 70 11/29/2011  . Essential hypertension 11/29/2011  . Hypothyroidism 11/29/2011   Past Medical History:  Past Medical History  Diagnosis Date  . S/P CABG x 2 2009    LIMA-LAD, SVG-RCA, with AV fistula ligation and Maze procedure  . CAD (coronary artery disease), native coronary artery 2009    75% LAD, diffuse 75-90% RCA --> Referred for CABG + MAZE;  Cardiolite 12/2013: No ischemia or Infarction  . Ischemic cardiomyopathy 2012    EF ~40-45% by Echo  . PAF (paroxysmal atrial fibrillation) 04/05/2012    s/p MAZE -- recurrence, cardioversion-converted to sinus bradycardia --> Now persistent despite being on Amiodarone & Pradaxa  . Essential hypertension   . Dyslipidemia, goal LDL below 70     On Crestor, followed by PCP  . COPD (chronic obstructive pulmonary disease)   . PAD (peripheral artery disease) 2007    s/p L Ileac A stent ; most recent Dopplers July 2012: Less than 50% reduction bilaterally. ABI 0.96 on the right 0.88 on left;;;12/27/2011   -ABI right .87 and left ABI .78  ,LEFT CIA and EIA stent normall patency, left CFA,SFA,and popliteal 0-49%; rgt proximal SFA 50-69%,rgt CIA,EIA, and CFA 0-49%  .  Chronic low back pain     scoliosis & lordosis  . H/O: pneumonia   . Hypothyroidism   . Anxiety   . Cervical neck pain with evidence of disc disease     with need for surgery -- November 2015  . Osteoarthritis of back     And neck, hands,spine   Past Surgical History:  Past Surgical History  Procedure Laterality Date  . Iliac artery stent Left 04/06/2005    Ex Iliac - CFA (Smart STENTS -- 7x4 in EIA, 6 x 3 CFA)   . Coronary artery bypass graft  2009    LIMA-LAD, SVG-RCA (also MAZE & AV fistula ligation)  . Maze  2009    along with CABG  . Cardioversion  04/05/2012    Procedure: CARDIOVERSION;  Surgeon: Thurmon Fair, MD;  [facility] END[facility]COPY;  Service: Cardiovascular;  Laterality: N/A;  . Transthoracic echocardiogram  Jan 2012    EF ~40-45%, global HK; PAP ~45-50 mmHg  . Nm myoview ltd  May 2013    No ischemia or Infarct  . Abdominal hysterectomy    . Cardiac catheterization  07/30/2007    75% LAD ,3 stenoses of 75-90% in  RCA;circumflex from proximal RCA with no lesion seen,normal ramus intermediate branh; normal LV systoilc function  . Nm cardiolite ltd  12/2013    Non-gated for Afib; No Ischemia or Infarction.  Marland Kitchen Anterior cervical decomp/discectomy fusion N/A 01/23/2014    Procedure: ANTERIOR CERVICAL DECOMPRESSION/DISCECTOMY FUSION CERVICAL FOUR-FIVE,CERVICAL FIVE-SIX,CERVICAL  SIX-SEVEN;  Surgeon: Mariam Dollar, MD;  Location: [facility] NEURO ORS;  Service: Neurosurgery;  Laterality: N/A;  . Colonoscopy with propofol N/A 11/12/2014    Procedure: COLONOSCOPY WITH PROPOFOL;  Surgeon: Charna Elizabeth, MD;  Location: [facility] ENDOSCOPY;  Service: Endoscopy;  Laterality: N/A;   HPI:  [name] is a 74 y.o. female who was admitted 11/17/14 due to being unable to talk as well as a right facial droop. CT head did not show anything acute, MRI with multifocal left MCA.  Orders received for cognitive-linguisitc evaluation.   Assessment / Plan / Recommendation Clinical Impression  Cognitive-linguistic  evaluation complete.  SLP administered with Western Aphasia Battery Bedside; however, due to severity of visual impairments all components of spontaneous speech: content, sequential commands, reading and writing were not able to be completed and therefore scores could not be obtained.  Patient presents with impaired high level abilities in assess sections and with anomia in conversation.  Patient also demonstrates moderate impairments in recall information.  Patient able to express basic needs and wants; however, given that she was managing medications and finances prior to admission she will require skilled SLP services in address deficits, maximize functional independence and reduce caregiver burden.  SLP will follow acutely for use of compensatory strategies as well for education prior to discharge.      SLP Assessment  Patient needs continued Speech Lanaguage Pathology Services    Follow Up Recommendations  Home health SLP    Frequency and Duration min 2x/week  1 week   Pertinent Vitals/Pain Pain Assessment: No/denies pain Pain Score: 10-Worst pain ever Pain Location: back (began 1 year ago) Pain Descriptors / Indicators:  ("comes and goes") Pain Intervention(s): Monitored during session;Repositioned;Premedicated before session   SLP Goals  Progression toward goals:  (Eval ) Patient/Family Stated Goal: to go home Potential to Achieve Goals (ACUTE ONLY): Good Potential Considerations (ACUTE ONLY): Ability to learn/carryover information  SLP Evaluation Prior Functioning  Cognitive/Linguistic Baseline: Within functional limits Type of Home: House  Lives With: Family;Daughter Available Help at Discharge: Family;Available 24 hours/day   Cognition  Overall Cognitive Status: Impaired/Different from baseline Arousal/Alertness: Awake/alert Orientation Level: Oriented to place;Oriented to person;Oriented to time;Oriented to situation Attention: Sustained Sustained Attention: Appears  intact Memory: Impaired Memory Impairment: Storage deficit;Retrieval deficit;Decreased recall of new information Awareness: Impaired Awareness Impairment: Anticipatory impairment Problem Solving: Impaired Problem Solving Impairment: Verbal complex;Functional basic Behaviors: Perseveration Safety/Judgment: Appears intact    Comprehension  Auditory Comprehension Overall Auditory Comprehension: Impaired Commands: Impaired Two Step Basic Commands: 75-100% accurate Multistep Basic Commands: 50-74% accurate Complex Commands: 50-74% accurate Conversation: Simple Other Conversation Comments: impaired with complex Pharmacist, community Discrimination: Not tested (no glasses) Reading Comprehension Reading Status: Unable to assess (comment) (no glasses)    Expression Expression Primary Mode of Expression: Verbal Verbal Expression Overall Verbal Expression: Impaired Initiation: No impairment Level of Generative/Spontaneous Verbalization: Sentence Repetition: No impairment Naming: Impairment Responsive: 76-100% accurate Confrontation: Impaired Convergent: 50-74% accurate Divergent: 50-74% accurate Other Naming Comments: anomia in conversation  Verbal Errors: Aware of errors Pragmatics: No impairment Effective Techniques: Open ended questions;Semantic cues Non-Verbal Means of Communication: Not applicable Written Expression Dominant Hand: Right Written Expression: Exceptions to [facility] (limited due to no glasses)   Oral / Motor Oral Motor/Sensory Function Overall Oral Motor/Sensory Function: Impaired Labial ROM: Reduced right Labial Symmetry: Abnormal symmetry right Labial Strength: Reduced Lingual ROM: Reduced right Lingual Symmetry: Abnormal symmetry right Lingual Strength: Reduced Velum: Within Functional Limits Mandible: Within Functional Limits Motor Speech Overall  Motor Speech: Appears within functional limits for tasks assessed (grossly [facility]) Effective  Techniques: Increased vocal intensity   GO    [name]., CCC-SLP [phone]  [name] 11/19/2014, 10:17 AM

## 2014-11-19 NOTE — Progress Notes (Signed)
Utilization review completed. Sai Moura, RN, BSN. 

## 2014-11-19 NOTE — Progress Notes (Signed)
Patient discharged from 31m03 at 1800. All questions answered and prescriptions given.

## 2014-11-19 NOTE — Care Management Note (Addendum)
Case Management Note  Patient Details  Name: [name] MRN: [id number] Date of Birth: [birth year]  Subjective/Objective:                    Action/Plan: Benefits check results: Per rep at [facility]:   Eliquis: covered, no auth required, patient is on low income subsidy $7.40 for 30 day retail   Met with patient and daughter to discuss discharge needs. Patient was provided with an Eliquis 30 day free card.  Patient is agreeable to home health services and has chosen [facility]. Tiffany with [facility] was notified and has accepted the referral for discharge home today. Patient's daughter DeeDee is the preferred contact for appointments. [phone].  [facility] was notified of need for equipment prior to discharge home later today.  Expected Discharge Date:                  Expected Discharge Plan:  [place]  In-House Referral:     Discharge planning Services  CM Consult, Medication Assistance  Post Acute Care Choice:    Choice offered to:  Patient, Adult Children  DME Arranged:    DME Agency:     HH Arranged:  PT, OT HH Agency:  [place]  Status of Service:  Completed, signed off  Medicare Important Message Given:    Date Medicare IM Given:    Medicare IM give by:    Date Additional Medicare IM Given:    Additional Medicare Important Message give by:     If discussed at [place] of Stay Meetings, dates discussed:    Additional Comments:  Rolm Baptise, RN 11/19/2014, 4:00 PM

## 2014-11-19 NOTE — Discharge Summary (Signed)
Physician Discharge Summary  [name] JXB:[id number] DOB: [birth year] DOA: 11/18/2014  PCP: [name], MD  Admit date: 11/18/2014 Discharge date: 11/19/2014  Time spent: 35 minutes  Recommendations for Outpatient Follow-up:   Bowel regimen  Need close follow up of HR as amiodarone wears off  Home health  Discharge Diagnoses:  Principal Problem:   Stroke Active Problems:   2 Vessel CAD - s/p CABG; LIMA-LAD, SVG-RCA   Chronic atrial fibrillation   Essential hypertension   Ischemic cardiomyopathy   S/P CABG x 2   CKD (chronic kidney disease) stage 3, GFR 30-59 ml/min   Stroke with cerebral ischemia   Weight loss   HLD (hyperlipidemia)   Discharge Condition: improved  Diet recommendation: cardiac  Filed Weights   11/17/14 2301  Weight: 40.143 kg (88 lb 8 oz)    History of present illness:  [name] is a 74 y.o. female with history of chronic atrial fibrillation, cardiomyopathy last EF measured was 40-45%, CAD status post CABG, hypertension, chronic kidney disease was brought to the patient's daughter found that patient was not able to talk and also was found to have right facial droop. As per patient's daughter patient was doing fine and was last seen normal at around 7 PM last night by patient's son. At around 8 PM patient's daughter came in and saw the patient was on the bed which is unusual for her she also was mildly diaphoretic. When they tried to communicate she was not able to talk and patient also was found to have right facial droop. Patient was brought to the ER and CT head did not show anything acute. On-call neurologist Dr. Cyril Mourning was consulted and patient was admitted for further management of acute stroke. Patient was recently changed to xarelto from Pradaxa 2 months ago. Lastly patient also had colonoscopy done during which xarelto was on hold but was restarted again. Patient at this time is not able to communicate but follows commands. Moves all  extremities  Hospital Course:  Stroke: left MCA territory infarcts embolic likely secondary to known atrial fibrillation. 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 To make up the story, it is likely that the thrombus formed during the time she stopped Xarelto for colonoscopy preparation. Therefore, in the future, if pt needs any procedure which requires stop anticoagulation, neurologist or cardiologist has to be consulted in advance. Pt has been on Xarelto and ASA 81mg  but Xarelto may be under-dosed. Has changed to eliquis full dose.   Resultant Mild broca's aphasia  MRI L MCA territory infarcts.   MRA Narrowing distal L M1, complete occlusion distasl L M2, L ACA poorly seen  Carotid Doppler unremarkable  2D Echo EF 55% No source of embolus  LDL 81  HgbA1c 5.5  Pan-CT ruled out maligancy but found to have LAA thrombus.  apixaban for VTE prophylaxis  xarelto ( rivaroxaban) prior to admission, now on eliquis (apixaban). Pt may have been under dosed for Xarelto. Now on full dose eliquis. Continue ASA 81mg  for cardiac protection.  Patient counseled to be compliant with her antithrombotic medications  Ongoing aggressive stroke risk factor management  Therapy recommendations: HH therapies recommended.   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 amiodarone -will need close cardiac monitoring and slow increase of BB   Hypertension  Stable  Hyperlipidemia  Home meds: crestor 5, resumed in hospital  LDL 81, goal < 70  Continue statin at discharge  Other Stroke Risk Factors  Advanced age  Former Cigarette smoker, quit 16 years ago  Family hx stroke (father)  2 vessel Coronary artery disease s/p CABG - continue ASA 83  PAD  Ischemic cardiomyopathy - EF 55%  Procedures:    Consultations:  Neuro  cardiology  Discharge Exam: Filed Vitals:   11/19/14 1018  BP: 122/69  Pulse: 81  Temp: 98 F (36.7 C)  Resp: 20    General: awake, NAD- daughter at bedside   Discharge Instructions   Discharge Instructions    Ambulatory referral to Neurology    Complete by:  As directed   Pt will follow up with Dr. Roda Shutters at [facility] in about 2 months. Thanks.          Current Discharge Medication List    CONTINUE these medications which have NOT CHANGED   Details  amiodarone (PACERONE) 200 MG tablet TAKE 1 TABLET (200 MG TOTAL) BY MOUTH DAILY AS DIRECTED. Qty: 30 tablet, Refills: 11    Cholecalciferol (VITAMIN D) 2000 UNITS CAPS Take 2,000 Units by mouth daily.    CRESTOR 5 MG tablet TAKE 1 TABLET BY MOUTH IN THE EVENING Qty: 30 tablet, Refills: 8    cyclobenzaprine (FLEXERIL) 10 MG tablet Take 10 mg by mouth 3 (three) times daily as needed for muscle spasms.    diazepam (VALIUM) 2 MG tablet Take 1 tablet (2 mg total) by mouth every 8 (eight) hours as needed for muscle spasms. Qty: 30 tablet, Refills: 0    gabapentin (NEURONTIN) 300 MG capsule Take 300 mg by mouth at bedtime.    LANOXIN 62.5 MCG TABS TAKE 1/2 TABLET (31.25MCG) BY MOUTH DAILY Qty: 15 tablet, Refills: 10    levothyroxine (SYNTHROID, LEVOTHROID) 75 MCG tablet Take 75 mcg by mouth daily before breakfast.    metoprolol (LOPRESSOR) 50 MG tablet Take 0.5 tablets (25 mg total) by  mouth 2 (two) times daily. Qty: 60 tablet, Refills: 6    Rivaroxaban (XARELTO) 15 MG TABS tablet Take 1 tablet by mouth daily with a meal.  Do not take any more Pradaxa Qty: 30 tablet, Refills: 5    docusate sodium (COLACE) 100 MG capsule Take 1 capsule (100 mg total) by mouth every 12 (twelve) hours. Qty: 60 capsule, Refills: 0       No Known Allergies Follow-up Information    Follow up with Xu,Jindong, MD. Schedule an appointment as soon as possible for a visit in 2 months.   Specialty:  Neurology   Why:  stroke clinic   Contact information:   [address] [place] [address] [phone]        The results of significant diagnostics from this hospitalization (including imaging, microbiology, ancillary and laboratory) are listed below for reference.    Significant Diagnostic Studies: Dg Chest 2 View  11/18/2014   CLINICAL DATA:  CVA.  Altered mental status  EXAM: CHEST  2 VIEW  COMPARISON:  November 03, 2014 pain and April 27, 2014  FINDINGS: Lungs  are somewhat hyperexpanded with areas of scarring and peribronchial thickening present. A degree of chronic bronchitis is felt to be present. The interstitium remains diffusely prominent without new opacity. No frank edema or consolidation. The heart is upper normal in size with pulmonary vascularity within normal limits. No adenopathy. Patient is status post coronary artery bypass grafting. There is postoperative change in the lower cervical spine. No adenopathy. There is atherosclerotic change in the aorta. There is stable mild anterior wedging of mid and lower thoracic vertebral bodies.  IMPRESSION: Changes indicative of chronic bronchitis. Generalized interstitial prominence is stable. No new opacity. It should be noted that mild chronic congestive heart failure cannot be excluded in this circumstance. No airspace consolidation. No change in cardiac silhouette.   Electronically Signed   By: Bretta Bang III M.D.    On: 11/18/2014 08:07   Dg Chest 2 View  11/03/2014   CLINICAL DATA:  Two day history of chest and left arm pain.  EXAM: CHEST  2 VIEW  COMPARISON:  04/27/2014.  FINDINGS: The cardiac silhouette, mediastinal and hilar contours are within normal limits and stable. There is tortuosity and calcification of the thoracic aorta. Stable surgical changes from bypass surgery. Chronic emphysematous and bronchitic lung changes but no acute pulmonary findings. No pleural effusion. The bony thorax is intact.  IMPRESSION: Chronic lung changes but no acute pulmonary findings.   Electronically Signed   By: Rudie Meyer M.D.   On: 11/03/2014 19:08   Ct Head Wo Contrast  11/18/2014   CLINICAL DATA:  Code stroke. Left-sided facial droop. Initial encounter.  EXAM: CT HEAD WITHOUT CONTRAST  TECHNIQUE: Contiguous axial images were obtained from the base of the skull through the vertex without intravenous contrast.  COMPARISON:  None.  FINDINGS: There is no evidence of acute infarction, mass lesion, or intra- or extra-axial hemorrhage on CT.  Scattered periventricular and subcortical white matter change likely reflects small vessel ischemic microangiopathy. A small chronic infarct is noted at the right basal ganglia.  The posterior fossa, including the cerebellum, brainstem and fourth ventricle, is within normal limits. The third and lateral ventricles are unremarkable in appearance. The cerebral hemispheres demonstrate grossly normal gray-white differentiation. No mass effect or midline shift is seen.  There is no evidence of fracture; visualized osseous structures are unremarkable in appearance. The orbits are within normal limits. The paranasal sinuses and mastoid air cells are well-aerated. No significant soft tissue abnormalities are seen.  IMPRESSION: 1. No acute intracranial pathology seen on CT. 2. Scattered small vessel ischemic microangiopathy. 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  These results were called by  telephone at the time of interpretation on 11/18/2014 at 12:36 am to Dr. Leroy Kennedy, who verbally acknowledged these results.   Electronically Signed   By: Roanna Raider M.D.   On: 11/18/2014 00:38   Ct Chest W Contrast  11/18/2014   CLINICAL DATA:  Unexplained weight loss.  EXAM: CT CHEST, ABDOMEN, AND PELVIS WITH CONTRAST  TECHNIQUE: Multidetector CT imaging of the chest, abdomen and pelvis was performed following the standard protocol during bolus administration of intravenous contrast.  CONTRAST:  OMNIPAQUE IOHEXOL 300 MG/ML  SOLN  COMPARISON:  None.  Chest x-ray dated 11/18/2014, CT scan abdomen dated 06/13/2014 and CT scan of the chest dated 04/21/2010  FINDINGS: CT CHEST FINDINGS  There are several small nodules in the thyroid gland as well as a dense 7 mm calcification in the left lobe, all unchanged. The patient has emphysema primarily  involving the upper lobes. There is chronic scarring in the medial aspects of the lingula and right middle lobe, less prominent than on the prior exam. Chronic cardiomegaly particularly of the right atrium. Previous CABG. Extensive aortic atherosclerosis. No hilar or mediastinal adenopathy. There appears to be a thrombus in the left atrial appendage best seen on images 29 and 30 of series 2.  CT ABDOMEN AND PELVIS FINDINGS  Hepatobiliary: There is marked increase in the density of the liver parenchyma with an unusual enhancement pattern on the arterial phase images. The patient is on amiodarone which can cause hepatotoxicity. Biliary tree is normal.  Pancreas: Normal.  Spleen: Normal.  Adrenals/Urinary Tract: 17 mm cyst on the upper pole of the left kidney. 6 mm cyst on the lower pole. Several tiny cysts in both kidneys. No hydronephrosis. The left kidney is inferiorly and medially displaced by the spleen due to hyperinflation of the lungs due to emphysema.  Stomach/Bowel: Chronic prominence of the mucosa of the stomach. The bowel is normal including the terminal ileum  and appendix. Large amount of stool in the rectum.  Vascular/Lymphatic: Extensive arterial calcification in the abdomen.  Other: No free air or free fluid.  Musculoskeletal: No acute osseous abnormality.  IMPRESSION: 1. There appears to be a thrombus in the left atrial appendage. Chronic cardiomegaly with particular enlargement of the right atrium and right ventricle. 2. Emphysema. 3. Large amount of stool in the rectum which could represent a fecal impaction. 4. Extensive atherosclerosis. 5. Amiodarone hepatotoxicity.   Electronically Signed   By: Francene Boyers M.D.   On: 11/18/2014 20:09   Mr Maxine Glenn Head Wo Contrast  11/18/2014   CLINICAL DATA:  Initial evaluation for acute aphasia, right facial weakness.  EXAM: MRI HEAD WITHOUT CONTRAST  MRA HEAD WITHOUT CONTRAST  TECHNIQUE: Multiplanar, multiecho pulse sequences of the brain and surrounding structures were obtained without intravenous contrast. Angiographic images of the head were obtained using MRA technique without contrast.  COMPARISON:  Prior CT from earlier the same day.  FINDINGS: MRI HEAD FINDINGS  Generalized age-related cerebral atrophy present. Patchy T2/FLAIR hyperintensity within the periventricular white matter most consistent with chronic small vessel ischemic disease. T2 hyperintense lesion at the inferior right basal ganglia noted, likely dilated perivascular spaces this suppresses on FLAIR sequence.  Patchy restricted diffusion present within the cortical gray matter and underlying white matter of the left frontal lobe, compatible with acute left MCA territory infarct. Small focus of restricted diffusion also present within the postcentral gyrus of the the left parietal lobe (series 3, image 38). No associated hemorrhage or mass effect. There is question of additional patchy restricted diffusion within the medial left frontal lobe (series 3, image 32), more in the left ACA distribution. No right-sided ischemic infarcts. No infratentorial  infarct. Normal intravascular flow voids are maintained.  No mass lesion, mass effect, or midline shift. No hydrocephalus. No extra-axial fluid collection.  Craniocervical junction within normal limits. Fixation hardware noted within the upper cervical spine. Small degenerative disc bulge present at C3-4.  Pituitary gland normal. No acute abnormality about the orbits. Scattered mucosal thickening present within the ethmoidal air cells. Paranasal sinuses are otherwise grossly clear. Minimal opacity present within the inferior mastoid air cells bilaterally. Inner ear structures normal.  Bone marrow signal intensity within normal limits. Scalp soft tissues unremarkable.  MRA HEAD FINDINGS  ANTERIOR CIRCULATION:  Study is degraded by motion artifact.  Distal cervical segments of the internal carotid arteries are not well evaluated on this  exam. Petrous segments patent bilaterally. Cavernous and supraclinoid segments widely patent without definite high-grade stenosis. A1 segments patent bilaterally. The left anterior S are well artery is markedly diminutive as compared to the right, and not well evaluated on this exam.  The distal left M1 segment is narrowed and attenuated in appearance, which may related this stenosis and/or occlusive thrombus. The left middle cerebral artery appears to be, a completely occluded at the proximal left M2 segment at the base of the sylvian fissure (series 6, image 91). Left MCA branches are essentially absent distally.  Right M1 segment widely patent without stenosis or occlusion. Right MCA bifurcation within normal limits. Distal right MCA branches well opacified.  POSTERIOR CIRCULATION:  Vertebral arteries are grossly patent to the vertebrobasilar junction. The posterior inferior cerebral arteries are not well evaluated on this exam. Basilar artery patent. Superior cerebellar arteries not well visualized. Posterior cerebral arteries grossly patent without definite stenosis or occlusion.   No definite aneurysm or vascular malformation.  IMPRESSION: MRI HEAD IMPRESSION:  1. Patchy ischemic nonhemorrhagic multi focal left MCA territory infarcts as above. No significant mass effect. 2. Possible additional acute left ACA territory infarcts within the parasagittal anterior left frontal lobe. No associated hemorrhage or mass effect. 3. Age-related cerebral atrophy with mild chronic microvascular ischemic disease.  MRA HEAD IMPRESSION:  1. Motion degraded study. 2. Narrowing with attenuation of the distal left M1 segment, which may be related to stenosis and/or occlusive thrombus. There is complete occlusion of the left MCA just distally at the M2 level within the proximal left sylvian fissure. Left MCA branches are absent. 3. Markedly attenuated left anterior cerebral artery, poorly evaluated on this motion degraded exam. This may be related to stenosis and/or partially occlusive thrombus and/or hypoplasia. Further evaluation with dedicated CTA of the brain may be helpful for further characterization due to more rapid imaging time as clinically desired. 4. Grossly patent vertebrobasilar circulation.   Electronically Signed   By: Rise Mu M.D.   On: 11/18/2014 04:11   [name] Contrast  11/18/2014   CLINICAL DATA:  Initial evaluation for acute aphasia, right facial weakness.  EXAM: MRI HEAD WITHOUT CONTRAST  MRA HEAD WITHOUT CONTRAST  TECHNIQUE: Multiplanar, multiecho pulse sequences of the brain and surrounding structures were obtained without intravenous contrast. Angiographic images of the head were obtained using MRA technique without contrast.  COMPARISON:  Prior CT from earlier the same day.  FINDINGS: MRI HEAD FINDINGS  Generalized age-related cerebral atrophy present. Patchy T2/FLAIR hyperintensity within the periventricular white matter most consistent with chronic small vessel ischemic disease. T2 hyperintense lesion at the inferior right basal ganglia noted, likely dilated  perivascular spaces this suppresses on FLAIR sequence.  Patchy restricted diffusion present within the cortical gray matter and underlying white matter of the left frontal lobe, compatible with acute left MCA territory infarct. Small focus of restricted diffusion also present within the postcentral gyrus of the the left parietal lobe (series 3, image 38). No associated hemorrhage or mass effect. There is question of additional patchy restricted diffusion within the medial left frontal lobe (series 3, image 32), more in the left ACA distribution. No right-sided ischemic infarcts. No infratentorial infarct. Normal intravascular flow voids are maintained.  No mass lesion, mass effect, or midline shift. No hydrocephalus. No extra-axial fluid collection.  Craniocervical junction within normal limits. Fixation hardware noted within the upper cervical spine. Small degenerative disc bulge present at C3-4.  Pituitary gland normal. No acute abnormality about the orbits.  Scattered mucosal thickening present within the ethmoidal air cells. Paranasal sinuses are otherwise grossly clear. Minimal opacity present within the inferior mastoid air cells bilaterally. Inner ear structures normal.  Bone marrow signal intensity within normal limits. Scalp soft tissues unremarkable.  MRA HEAD FINDINGS  ANTERIOR CIRCULATION:  Study is degraded by motion artifact.  Distal cervical segments of the internal carotid arteries are not well evaluated on this exam. Petrous segments patent bilaterally. Cavernous and supraclinoid segments widely patent without definite high-grade stenosis. A1 segments patent bilaterally. The left anterior S are well artery is markedly diminutive as compared to the right, and not well evaluated on this exam.  The distal left M1 segment is narrowed and attenuated in appearance, which may related this stenosis and/or occlusive thrombus. The left middle cerebral artery appears to be, a completely occluded at the proximal  left M2 segment at the base of the sylvian fissure (series 6, image 91). Left MCA branches are essentially absent distally.  Right M1 segment widely patent without stenosis or occlusion. Right MCA bifurcation within normal limits. Distal right MCA branches well opacified.  POSTERIOR CIRCULATION:  Vertebral arteries are grossly patent to the vertebrobasilar junction. The posterior inferior cerebral arteries are not well evaluated on this exam. Basilar artery patent. Superior cerebellar arteries not well visualized. Posterior cerebral arteries grossly patent without definite stenosis or occlusion.  No definite aneurysm or vascular malformation.  IMPRESSION: MRI HEAD IMPRESSION:  1. Patchy ischemic nonhemorrhagic multi focal left MCA territory infarcts as above. No significant mass effect. 2. Possible additional acute left ACA territory infarcts within the parasagittal anterior left frontal lobe. No associated hemorrhage or mass effect. 3. Age-related cerebral atrophy with mild chronic microvascular ischemic disease.  MRA HEAD IMPRESSION:  1. Motion degraded study. 2. Narrowing with attenuation of the distal left M1 segment, which may be related to stenosis and/or occlusive thrombus. There is complete occlusion of the left MCA just distally at the M2 level within the proximal left sylvian fissure. Left MCA branches are absent. 3. Markedly attenuated left anterior cerebral artery, poorly evaluated on this motion degraded exam. This may be related to stenosis and/or partially occlusive thrombus and/or hypoplasia. Further evaluation with dedicated CTA of the brain may be helpful for further characterization due to more rapid imaging time as clinically desired. 4. Grossly patent vertebrobasilar circulation.   Electronically Signed   By: Rise Mu M.D.   On: 11/18/2014 04:11   Ct Abdomen Pelvis W Contrast  11/18/2014   CLINICAL DATA:  Unexplained weight loss.  EXAM: CT CHEST, ABDOMEN, AND PELVIS WITH CONTRAST   TECHNIQUE: Multidetector CT imaging of the chest, abdomen and pelvis was performed following the standard protocol during bolus administration of intravenous contrast.  CONTRAST:  OMNIPAQUE IOHEXOL 300 MG/ML  SOLN  COMPARISON:  None.  Chest x-ray dated 11/18/2014, CT scan abdomen dated 06/13/2014 and CT scan of the chest dated 04/21/2010  FINDINGS: CT CHEST FINDINGS  There are several small nodules in the thyroid gland as well as a dense 7 mm calcification in the left lobe, all unchanged. The patient has emphysema primarily involving the upper lobes. There is chronic scarring in the medial aspects of the lingula and right middle lobe, less prominent than on the prior exam. Chronic cardiomegaly particularly of the right atrium. Previous CABG. Extensive aortic atherosclerosis. No hilar or mediastinal adenopathy. There appears to be a thrombus in the left atrial appendage best seen on images 29 and 30 of series 2.  CT ABDOMEN AND  PELVIS FINDINGS  Hepatobiliary: There is marked increase in the density of the liver parenchyma with an unusual enhancement pattern on the arterial phase images. The patient is on amiodarone which can cause hepatotoxicity. Biliary tree is normal.  Pancreas: Normal.  Spleen: Normal.  Adrenals/Urinary Tract: 17 mm cyst on the upper pole of the left kidney. 6 mm cyst on the lower pole. Several tiny cysts in both kidneys. No hydronephrosis. The left kidney is inferiorly and medially displaced by the spleen due to hyperinflation of the lungs due to emphysema.  Stomach/Bowel: Chronic prominence of the mucosa of the stomach. The bowel is normal including the terminal ileum and appendix. Large amount of stool in the rectum.  Vascular/Lymphatic: Extensive arterial calcification in the abdomen.  Other: No free air or free fluid.  Musculoskeletal: No acute osseous abnormality.  IMPRESSION: 1. There appears to be a thrombus in the left atrial appendage. Chronic cardiomegaly with particular  enlargement of the right atrium and right ventricle. 2. Emphysema. 3. Large amount of stool in the rectum which could represent a fecal impaction. 4. Extensive atherosclerosis. 5. Amiodarone hepatotoxicity.   Electronically Signed   By: Francene Boyers M.D.   On: 11/18/2014 20:09    Microbiology: No results found for this or any previous visit (from the past 240 hour(s)).   Labs: Basic Metabolic Panel:  Recent Labs Lab 11/17/14 2315 11/17/14 2321 11/18/14 0638  NA 138 139 141  K 4.3 4.5 4.7  CL 100* 99* 102  CO2 29  --  28  GLUCOSE 115* 111* 147*  BUN 16 25* 17  CREATININE 1.14* 1.20* 0.98  CALCIUM 9.4  --  9.3   Liver Function Tests:  Recent Labs Lab 11/17/14 2315 11/18/14 0638  AST 42* 41  ALT 35 34  ALKPHOS 66 63  BILITOT 0.5 0.8  PROT 7.2 6.9  ALBUMIN 4.4 4.4   No results for input(s): LIPASE, AMYLASE in the last 168 hours. No results for input(s): AMMONIA in the last 168 hours. CBC:  Recent Labs Lab 11/17/14 2315 11/17/14 2321 11/18/14 0638  WBC 5.4  --  7.5  NEUTROABS 4.0  --   --   HGB 12.2 14.6 12.2  HCT 41.0 43.0 41.1  MCV 75.4*  --  74.9*  PLT 176  --  198   Cardiac Enzymes: No results for input(s): CKTOTAL, CKMB, CKMBINDEX, TROPONINI in the last 168 hours. BNP: BNP (last 3 results)  Recent Labs  05/02/14 1902  BNP 344.0*    ProBNP (last 3 results) No results for input(s): PROBNP in the last 8760 hours.  CBG:  Recent Labs Lab 11/17/14 2313 11/18/14 0156 11/18/14 0641 11/18/14 1152 11/18/14 2153  GLUCAP 114* 110* 144* 108* 93       Signed:  Subhan Hoopes  [facility] 11/19/2014, 3:21 PM

## 2014-11-19 NOTE — Progress Notes (Signed)
Physical Therapy Evaluation  Clinical Impression: Pt admitted with the below diagnosis. Pt currently with functional limitations due to the deficits listed below (see PT Problem List). Demonstrates instability with dynamic tasks, and difficulty following simple motor commands at times. Stability improves with rolling walker use. Family available 24/7 at d/c. Will greatly benefit from HHPT to progress balance. Will monitor and address functional abilities while admitted.    11/19/14 0800  PT Visit Information  Last PT Received On 11/19/14  Assistance Needed +1  History of Present Illness [name] is an 74 y.o. female with a past medical history that is relevant for HTN, hyperlipidemia, CAD s.p CABG, ischemic cardiomyopathy, PAF on xarelto, and PAD, brought in by EMS due to acute onset of aphasia, right face weakness. MRI indicates Patchy ischemic nonhemorrhagic multi focal left MCA territory infarcts, and Possible additional acute left ACA territory infarcts.  Precautions  Precautions Fall  Restrictions  Weight Bearing Restrictions No  Home Living  Family/patient expects to be discharged to: Private residence  Living Arrangements Children (daughter and grandchild)  Available Help at Discharge Family;Available 24 hours/day  Type of Home House  Home Access Level entry  Home Layout Two level;Able to live on main level with bedroom/bathroom  Home Equipment None  Prior Function  Level of Independence Independent  Communication  Communication Expressive difficulties (word finding difficulties)  Pain Assessment  Pain Assessment 0-10  Pain Score 10  Pain Location back (began 1 year ago)  Pain Descriptors / Indicators ("comes and goes")  Pain Intervention(s) Monitored during session;Repositioned;Premedicated before session  Cognition  Arousal/Alertness Awake/alert  Behavior During Therapy WFL for tasks assessed/performed  Overall Cognitive Status Impaired/Different from baseline   Area of Impairment Orientation  Orientation Level Disoriented to;Time (unsure of year)  General Comments word finding difficulties  Upper Extremity Assessment  Upper Extremity Assessment Defer to OT evaluation  Lower Extremity Assessment  Lower Extremity Assessment Overall WFL for tasks assessed  Bed Mobility  Overal bed mobility Modified Independent  General bed mobility comments extra time  Transfers  Overall transfer level Needs assistance  Equipment used None;Rolling walker (2 wheeled)  Transfers Sit to/from Stand  Sit to Stand Supervision  General transfer comment supervision for safety. Stable upon standing with and without RW support.  Ambulation/Gait  Ambulation/Gait assistance Min assist  Ambulation Distance (Feet) 275 Feet  Assistive device Rolling walker (2 wheeled);None  Gait Pattern/deviations Step-through pattern;Decreased stride length;Shuffle;Trunk flexed;Staggering left;Staggering right;Drifts right/left  General Gait Details Difficulty with dynamic balance tasks, demonstrating loss of balance requiring min assist to correct. Staggers with high marching, slow gait speed, and quick turns. Greatly improves with RW for support. Educated on safe use with this device. VC for awareness of deficits.  Gait velocity decreased  Gait velocity interpretation Below normal speed for age/gender  Balance  Overall balance assessment Needs assistance  Sitting-balance support No upper extremity supported;Feet supported  Sitting balance-Leahy Scale Good  Standing balance support No upper extremity supported  Standing balance-Leahy Scale Fair  General Comments  General comments (skin integrity, edema, etc.) Daughter present at end of therapy session. discussed d/c recommendations and supervison at home. grandson available 24/7 and daughter will provide additonal supervision as needed.  PT - End of Session  Equipment Utilized During Treatment Gait belt  Activity Tolerance Patient  tolerated treatment well  Patient left in bed;with call bell/phone within reach;with bed alarm set;with family/visitor present  Nurse Communication Mobility status  PT Assessment  PT Therapy Diagnosis  Abnormality of gait;Difficulty  walking  PT Recommendation/Assessment Patient needs continued PT services  PT Problem List Decreased activity tolerance;Decreased balance;Decreased mobility;Decreased coordination;Decreased cognition;Decreased knowledge of use of DME  PT Plan  PT Frequency (ACUTE ONLY) Min 4X/week  PT Treatment/Interventions (ACUTE ONLY) DME instruction;Gait training;Functional mobility training;Therapeutic activities;Therapeutic exercise;Balance training;Neuromuscular re-education;Cognitive remediation;Patient/family education  PT Recommendation  Follow Up Recommendations Home health PT;Supervision/Assistance - 24 hour  PT equipment Rolling walker with 5" wheels (youth RW)  Individuals Consulted  Consulted and Agree with Results and Recommendations Family member/caregiver;Patient  Family Member Consulted daughter  Acute Rehab PT Goals  Patient Stated Goal Go home  PT Goal Formulation With patient/family  Time For Goal Achievement 12/03/14  Potential to Achieve Goals Good  PT Time Calculation  PT Start Time (ACUTE ONLY) 4098  PT Stop Time (ACUTE ONLY) 0907  PT Time Calculation (min) (ACUTE ONLY) 25 min  PT General Charges  $$ ACUTE PT VISIT 1 Procedure  PT Evaluation  $Initial PT Evaluation Tier I 1 Procedure  PT Treatments  $Gait Training 8-22 mins  Written Expression  Dominant Hand Right    Charlsie Merles, PT [phone]

## 2014-11-19 NOTE — Clinical Documentation Improvement (Signed)
Hospitalist  Can the diagnosis of CHF be further specified?    Acuity - Acute, Chronic, Acute on Chronic   Type - Systolic, Diastolic, Systolic and Diastolic  Other  Clinically Undetermined  Supporting Information:   ECHO reveals EF 55% with moderate pulmonary hypertension and normal RV size and systolic function  Being treated with PO Lopressor 2x's daily  Please exercise your independent, professional judgment when responding. A specific answer is not anticipated or expected.   Thank You,  Shellee Milo [employer] Information [facility] [place]

## 2014-12-27 ENCOUNTER — Other Ambulatory Visit: Payer: Self-pay | Admitting: Cardiology

## 2014-12-28 NOTE — Telephone Encounter (Signed)
Rx request sent to pharmacy.  

## 2015-01-24 ENCOUNTER — Other Ambulatory Visit: Payer: Self-pay | Admitting: Cardiology

## 2015-02-03 ENCOUNTER — Ambulatory Visit: Payer: Medicare HMO | Admitting: Neurology

## 2015-03-31 ENCOUNTER — Encounter: Payer: Self-pay | Admitting: Cardiology

## 2015-03-31 ENCOUNTER — Ambulatory Visit (INDEPENDENT_AMBULATORY_CARE_PROVIDER_SITE_OTHER): Payer: Medicare HMO | Admitting: Cardiology

## 2015-03-31 VITALS — BP 116/68 | HR 132 | Ht 60.0 in | Wt 86.6 lb

## 2015-03-31 DIAGNOSIS — I482 Chronic atrial fibrillation, unspecified: Secondary | ICD-10-CM

## 2015-03-31 DIAGNOSIS — I4891 Unspecified atrial fibrillation: Secondary | ICD-10-CM | POA: Diagnosis not present

## 2015-03-31 DIAGNOSIS — E785 Hyperlipidemia, unspecified: Secondary | ICD-10-CM | POA: Diagnosis not present

## 2015-03-31 DIAGNOSIS — I10 Essential (primary) hypertension: Secondary | ICD-10-CM | POA: Diagnosis not present

## 2015-03-31 DIAGNOSIS — I255 Ischemic cardiomyopathy: Secondary | ICD-10-CM

## 2015-03-31 DIAGNOSIS — R634 Abnormal weight loss: Secondary | ICD-10-CM

## 2015-03-31 DIAGNOSIS — Z951 Presence of aortocoronary bypass graft: Secondary | ICD-10-CM

## 2015-03-31 DIAGNOSIS — I2510 Atherosclerotic heart disease of native coronary artery without angina pectoris: Secondary | ICD-10-CM

## 2015-03-31 DIAGNOSIS — Z8673 Personal history of transient ischemic attack (TIA), and cerebral infarction without residual deficits: Secondary | ICD-10-CM

## 2015-03-31 MED ORDER — DIGOXIN 125 MCG PO TABS: 0.0625 mg | ORAL_TABLET | Freq: Every day | ORAL | Status: DC

## 2015-03-31 MED ORDER — METOPROLOL TARTRATE 50 MG PO TABS: ORAL_TABLET | ORAL | Status: DC

## 2015-03-31 NOTE — Patient Instructions (Addendum)
CHANGE IN MEDICATIONS:  TAKE DIGOXIN EVERY DAY  UNTIL FRIDAY  STARTING TONIGHT TAKE 50 MG METOPROLOL  IN THE MORNING ,TAKE 25 MG (1/2TABLET OF 50 MG ) METOPROLOL THEN TAKE 50 MG ( WHOLE TABLET IN THE EVENING.   SCHEDULE APPOINTMENT FOR Friday 04/02/15 FOR EKG AT OFFICE.  Your physician wants you to follow-up in 4-6 WEEKS WITH DR HADRING. You will receive a reminder letter in the mail two months in advance. If you don't receive a letter, please call our office to schedule the follow-up appointment.  If you need a refill on your cardiac medications before your next appointment, please call your pharmacy.

## 2015-03-31 NOTE — Progress Notes (Signed)
PCP: Brenda Real, MD  Clinic Note: Chief Complaint  Patient presents with  . 7 MONTH FOLLOW UP  . Shortness of Breath  . Atrial Fibrillation  . Coronary Artery Disease    HPI: [name] is a 74 y.o. female with a PMH below who presents today for delayed six-month followup for CAD and atrial fibrillation.[name] was last seen in May of 2016 - was doing relatively well.  -- had been on Pradaxa - changed to Xarelto (was held for Colonoscopy) & now Eliquis. Recent Hospitalizations: Aug 2016 - CVA (abnormal speech with facial droop)  Studies Reviewed:   Aug 2016: 2DEcho -- Impressions: - The patient was in atrial fibrillation. Normal LV size with EF 55%. Septal bounce consistent with prior cardiac surgery. Normal RV size and systolic function. Biatrial enlargement. Moderate TR. Moderate pulmonary hypertension. Mild MR.  (Previous 2012 EF on 40-45%, mod TR, lt atrium mildly dilated, mild MR, PA pk pressure 48)    Brain MRI 11/18/2014 MRI HEAD IMPRESSION:  1. Patchy ischemic nonhemorrhagic multi focal left MCA territory infarcts as above. No significant mass effect. 2. Possible additional acute left ACA territory infarcts within the parasagittal anterior left frontal lobe. No associated hemorrhage or mass effect. 3. Age-related cerebral atrophy with mild chronic microvascular ischemic disease.  MRA HEAD IMPRESSION:  1. Motion degraded study. 2. Narrowing with attenuation of the distal left M1 segment, which may be related to stenosis and/or occlusive thrombus. There is complete occlusion of the left MCA just distally at the M2 level within the proximal left sylvian fissure. Left MCA branches are absent. 3. Markedly attenuated left anterior cerebral artery, poorly evaluated on this motion degraded exam. This may be related to stenosis and/or partially occlusive thrombus and/or hypoplasia. Further evaluation with dedicated CTA of the brain may be helpful for further  characterization due to more rapid imaging time asclinically desired. 4. Grossly patent vertebrobasilar circulation.  Interval History: [name] is here today for her followup, but is much less talkative than usual. I was told a year about her stroke this past fall. Unfortunately it seems like this occurred when she had been holding her anticoagulation for a GI procedure. Basically, her daughter and son-in-law came home early that day to find her essentially unresponsive, unable to respond verbally and when the grunting. She had a facial droop. Most of the significant symptoms such as facial droop and unresponsiveness resolved, but she still has slow speech and has significantly worsening memory loss. Her cardiac standpoint, she really denies any chest tightness or pressure at rest or exertion, does get exertional dyspnea. She is really not doing much of anything now. She felt a little bit more short of breath and an unusual sensation in her chest this morning, but really no tightness or pressure associated with it. No lightheadedness, dizziness, weakness.  She is unaware of her heart going fast, but does feel a little bit "out of sorts ".  No chest pain with rest or exertion, but does have some exertional dyspnea.  No PND, orthopnea or edema. No syncope/near syncope. No TIA/amaurosis fugax symptoms.  ROS: A comprehensive was performed. Review of Systems  HENT: Positive for hearing loss.   Eyes: Positive for blurred vision. Negative for pain.       Worsening eyesight despite having cataract surgery  Respiratory: Negative for cough.   Cardiovascular: Negative for claudication and leg swelling.  Gastrointestinal: Negative for abdominal pain, blood in stool and melena.  Genitourinary: Negative for  hematuria.  Musculoskeletal: Positive for back pain, joint pain (Shoulder) and neck pain.  Neurological: Positive for dizziness and speech change (since stroke. She speaks much less frequently and is slow to  respond). Negative for focal weakness.       No recurrent TIA or amaurosis fugax symptoms  Psychiatric/Behavioral: Positive for memory loss (Ever since her stroke this has gotten worse.). The patient has insomnia.   All other systems reviewed and are negative.   Past Medical History  Diagnosis Date  . S/P CABG x 2 2009    LIMA-LAD, SVG-RCA, with AV fistula ligation and Maze procedure  . CAD (coronary artery disease), native coronary artery 2009    75% LAD, diffuse 75-90% RCA --> Referred for CABG + MAZE;  Cardiolite 12/2013: No ischemia or Infarction  . Ischemic cardiomyopathy 2012    EF ~40-45% by Echo  . PAF (paroxysmal atrial fibrillation) (HCC) 04/05/2012    s/p MAZE -- recurrence, cardioversion-converted to sinus bradycardia --> Now persistent despite being on Amiodarone & Pradaxa  . Essential hypertension   . Dyslipidemia, goal LDL below 70     On Crestor, followed by PCP  . COPD (chronic obstructive pulmonary disease) (HCC)   . PAD (peripheral artery disease) (HCC) 2007    s/p L Ileac A stent ; most recent Dopplers July 2012: Less than 50% reduction bilaterally. ABI 0.96 on the right 0.88 on left;;;12/27/2011   -ABI right .87 and left ABI .78  ,LEFT CIA and EIA stent normall patency, left CFA,SFA,and popliteal 0-49%; rgt proximal SFA 50-69%,rgt CIA,EIA, and CFA 0-49%  . Chronic low back pain     scoliosis & lordosis  . H/O: pneumonia   . Hypothyroidism   . Anxiety   . Cervical neck pain with evidence of disc disease     with need for surgery -- November 2015  . Osteoarthritis of back     And neck, hands,spine  . H/O ischemic left MCA stroke 11/18/2014    MRI/MRA of head: Multifocal left MCA infarct.. Also possible left ACA territory infarct -- complete occlusion of left MCA distally at M2 level. Marked left ACA attenuation.    Past Surgical History  Procedure Laterality Date  . Iliac artery stent Left 04/06/2005    Ex Iliac - CFA (Smart STENTS -- 7x4 in EIA, 6 x 3 CFA)   .  Coronary artery bypass graft  2009    LIMA-LAD, SVG-RCA (also MAZE & AV fistula ligation)  . Maze  2009    along with CABG  . Cardioversion  04/05/2012    Procedure: CARDIOVERSION;  Surgeon: Thurmon Fair, MD;  Location: [facility] ENDOSCOPY;  Service: Cardiovascular;  Laterality: N/A;  . Transthoracic echocardiogram  Jan 2012    EF ~40-45%, global HK; PAP ~45-50 mmHg  . Nm myoview ltd  May 2013    No ischemia or Infarct  . Abdominal hysterectomy    . Cardiac catheterization  07/30/2007    75% LAD ,3 stenoses of 75-90% in  RCA;circumflex from proximal RCA with no lesion seen,normal ramus intermediate branh; normal LV systoilc function  . Nm cardiolite ltd  12/2013    Non-gated for Afib; No Ischemia or Infarction.  Marland Kitchen Anterior cervical decomp/discectomy fusion N/A 01/23/2014    Procedure: ANTERIOR CERVICAL DECOMPRESSION/DISCECTOMY FUSION CERVICAL FOUR-FIVE,CERVICAL FIVE-SIX,CERVICAL SIX-SEVEN;  Surgeon: Mariam Dollar, MD;  Location: [facility] NEURO ORS;  Service: Neurosurgery;  Laterality: N/A;  . Colonoscopy with propofol N/A 11/12/2014    Procedure: COLONOSCOPY WITH PROPOFOL;  Surgeon: Charna Elizabeth,  MD;  Location: [facility] ENDOSCOPY;  Service: Endoscopy;  Laterality: N/A;   Prior to Admission medications   Medication Sig Start Date End Date Taking? Authorizing Provider  apixaban (ELIQUIS) 5 MG TABS tablet Take 1 tablet (5 mg total) by mouth 2 (two) times daily. 11/19/14  Yes Joseph Art, DO  aspirin EC 81 MG EC tablet Take 1 tablet (81 mg total) by mouth daily. 11/19/14  Yes Joseph Art, DO  bisacodyl (DULCOLAX) 10 MG suppository Place 1 suppository (10 mg total) rectally daily as needed for moderate constipation. 11/19/14  Yes Joseph Art, DO  Cholecalciferol (VITAMIN D) 2000 UNITS CAPS Take 2,000 Units by mouth daily.   Yes Historical Provider, MD  CRESTOR 5 MG tablet TAKE 1 TABLET BY MOUTH IN THE EVENING 10/19/14  Yes Marykay Lex, MD  cyclobenzaprine (FLEXERIL) 10 MG tablet Take 10 mg by mouth 3 (three)  times daily as needed for muscle spasms.   Yes Historical Provider, MD  diazepam (VALIUM) 2 MG tablet Take 1 tablet (2 mg total) by mouth every 8 (eight) hours as needed for muscle spasms. Patient taking differently: Take 2 mg by mouth at bedtime.  11/03/14  Yes Blake Divine, MD  Digoxin (LANOXIN) 62.5 MCG TABS TAKE 1/2 TABLET (31.25MCG) BY MOUTH EVERY OTHER DAY 11/19/14  Yes Joseph Art, DO  docusate sodium (COLACE) 100 MG capsule Take 1 capsule (100 mg total) by mouth every 12 (twelve) hours. 06/13/14  Yes Derwood Kaplan, MD  feeding supplement, ENSURE ENLIVE, (ENSURE ENLIVE) LIQD Take 237 mLs by mouth 2 (two) times daily between meals. 11/19/14  Yes Joseph Art, DO  gabapentin (NEURONTIN) 300 MG capsule Take 300 mg by mouth at bedtime. 10/03/14  Yes Historical Provider, MD  HYDROcodone-acetaminophen (NORCO/VICODIN) 5-325 MG per tablet Take 1 tablet by mouth every 6 (six) hours as needed for moderate pain. 11/19/14  Yes Joseph Art, DO  lactulose (CHRONULAC) 10 GM/15ML solution Take 15 mLs (10 g total) by mouth 2 (two) times daily as needed for mild constipation. 11/19/14  Yes Joseph Art, DO  levothyroxine (SYNTHROID, LEVOTHROID) 75 MCG tablet Take 75 mcg by mouth daily before breakfast.   Yes Historical Provider, MD  metoprolol (LOPRESSOR) 50 MG tablet TAKE 0.5 TABLETS (25 MG TOTAL) BY MOUTH 2 (TWO) TIMES DAILY. 12/28/14  Yes Marykay Lex, MD  metoprolol (LOPRESSOR) 50 MG tablet TAKE 0.5 TABLETS (25 MG TOTAL) BY MOUTH 2 (TWO) TIMES DAILY. 01/25/15  Yes Marykay Lex, MD   No Known Allergies   Social History   Social History  . Marital Status: Widowed    Spouse Name: N/A  . Number of Children: N/A  . Years of Education: N/A   Social History Main Topics  . Smoking status: Former Smoker -- 0.50 packs/day    Types: Cigarettes    Quit date: 01/07/1998  . Smokeless tobacco: Never Used  . Alcohol Use: No  . Drug Use: No  . Sexual Activity: Not Asked   Other Topics Concern  .  None   Social History Narrative    She is a divorced mother of 1 and one child who died. Her daughter is here with her   today. She has got 4 grandchildren. She is a native of [place] and only has an occasional alcoholic   beverage. She does not get routine exercise mostly because of her spine pain. She did previously smoke but quit many years ago.    Family History  Problem  Relation Age of Onset  . Heart attack Mother   . Stroke Father   . Heart disease Brother 60    Wt Readings from Last 3 Encounters:  03/31/15 86 lb 9.6 oz (39.282 kg)  11/17/14 88 lb 8 oz (40.143 kg)  11/12/14 83 lb (37.649 kg)    PHYSICAL EXAM BP 116/68 mmHg  Pulse 132  Ht 5' (1.524 m)  Wt 86 lb 9.6 oz (39.282 kg)  BMI 16.91 kg/m2 General appearance: she is a thin,frail-appearing elderly woman. No acute distress.   Neck: no adenopathy, no carotid bruit and no JVD Lungs: clear to auscultation bilaterally, normal percussion bilaterally and non-labored Heart:irregularly irregular rate and rhythm with rapid rate., S1& S2 normal, no murmur, click, rub or gallop; somewhat hyperdynamic precordium but nondisplaced PMI. Abdomen: scaphoid,soft, non-tender; bowel sounds normal; no masses,  no organomegaly;  Extremities: extremities normal, atraumatic, no cyanosis, and no edema  Pulses: 2+ and symmetric; somewhat thready due to A. Fib with RVR Skin: Thin, frail skin paperthin. Diffuse mild bruises or  Neurologic: Mental status: Alert, oriented, but slow to respond. Not very verbal (which seems unusual for her). Poor recollection of symptoms. Cranial nerves: normal (II-XII grossly intact)    Adult ECG Report  Rate: 132 ;  Rhythm: atrial fibrillation and Rapid ventricular response. Mild nonspecific ST and T-wave changes.; cannot exclude old anteroseptal infarct, age undetermined.  Narrative Interpretation:  Heart rate has increased since last EKG; axis is now below upward. Does not appear to be incomplete  RBBB   Other studies Reviewed: Additional studies/ records that were reviewed today include:  Recent Labs:   Lab Results  Component Value Date   CHOL 138 11/19/2014   HDL 65 11/19/2014   LDLCALC 63 11/19/2014   TRIG 49 11/19/2014   CHOLHDL 2.1 11/19/2014    Lab Results  Component Value Date   CREATININE 0.98 11/18/2014   Lab Results  Component Value Date   K 4.7 11/18/2014    ASSESSMENT / PLAN: Problem List Items Addressed This Visit    Weight loss (Chronic)    I strongly reinforced her daughter's concerns about her eating and stressed importance of eating. She is eating yogurt only. Recommended Carnation Instant Breakfast.      S/P CABG x 2 (Chronic)   Relevant Orders   EKG 12-Lead (Completed)   Ischemic cardiomyopathy (Chronic)    EF by recent echo actually appeared to be pretty much back to normal. No active heart failure symptoms. She was normal blood pressure regimen or to consider ACE inhibitor therapy. She is on Lopressor for rate control. As she is not having active heart failure and EF seems to be improved. Twice a day Lopressor is probably more appropriate now.      Relevant Medications   digoxin (LANOXIN) 0.125 MG tablet   metoprolol (LOPRESSOR) 50 MG tablet   Hyperlipidemia with target LDL less than 70 (Chronic)    Last checked in August. Cholesterol levels look great. Continue current dose of rosuvastatin. This was changed to generic rosuvastatin from Crestor.      Relevant Medications   digoxin (LANOXIN) 0.125 MG tablet   metoprolol (LOPRESSOR) 50 MG tablet   Other Relevant Orders   EKG 12-Lead (Completed)   H/O ischemic left MCA stroke (Chronic)    Very unfortunate situation. Despite our best efforts at keeping her anticoagulated, she had an event when she was holding. He does monitor. She is really not eating much since her stroke and has lost a  lot of weight.   Thankfully most residual weaknesses and speech difficulties resolved with the  exception of her memory loss and slow responsiveness.      Essential hypertension (Chronic)    No longer an issue. Stable on beta blocker.      Relevant Medications   digoxin (LANOXIN) 0.125 MG tablet   metoprolol (LOPRESSOR) 50 MG tablet   Other Relevant Orders   EKG 12-Lead (Completed)   Chronic atrial fibrillation (HCC) (Chronic)    Previous on amiodarone which was doing relatively well for rate control. Now need to increase beta blocker: She is no longer on amiodarone.  While I saw her she was on Pradaxa. This was changed to Xarelto and was subsequently changed to Eliquis her neurology records.. No bleeding issues.      Relevant Medications   digoxin (LANOXIN) 0.125 MG tablet   metoprolol (LOPRESSOR) 50 MG tablet   Other Relevant Orders   EKG 12-Lead (Completed)   Atrial fibrillation with RVR (HCC) - Primary    She is clearly A. Fib with RVR today. Her amiodarone was stopped during her hospital stay. This was by the cardiology team thinking that it was there only for rate control. Stop for avoidance of adverse effects. Unfortunately now we'll have adequate rate control with beta blocker. Digoxin had been working well, but his dose was reduced and is now beginning at the other day. She is now on Eliquis, and taken kidney. I don't think that in order to try. Plan is to try to increase rate control.  She will increase the metoprolol to 50 mg in the evening with 25 mg in the morning. Additional 25 mg as needed for rapid rates. I talked about how to check her heart rate. She will take full dose digoxin for the next 3 days. Return for EKG check with nurse on Friday to reassess heart rate.      Relevant Medications   digoxin (LANOXIN) 0.125 MG tablet   metoprolol (LOPRESSOR) 50 MG tablet   2 Vessel CAD - s/p CABG; LIMA-LAD, SVG-RCA (Chronic)    Stable. No anginal symptoms despite having a fibular. She recently. Continue beta blocker and statin. For now continue aspirin as that of  bleeding issues. We could easily stop aspirin necessary.      Relevant Medications   digoxin (LANOXIN) 0.125 MG tablet   metoprolol (LOPRESSOR) 50 MG tablet      Current medicines are reviewed at length with the patient today. (+/- concerns) no concerns The following changes have been made:  TAKE DIGOXIN EVERY DAY  UNTIL FRIDAY  STARTING TONIGHT TAKE 50 MG METOPROLOL  IN THE MORNING ,TAKE 25 MG (1/2TABLET OF 50 MG ) METOPROLOL THEN TAKE 50 MG ( WHOLE TABLET IN THE EVENING.   SCHEDULE APPOINTMENT FOR Friday 04/02/15 FOR EKG AT OFFICE.  Your physician wants you to follow-up in 4-6 WEEKS WITH DR HADRING.  Studies Ordered:   Orders Placed This Encounter  Procedures  . EKG 12-Lead      Marykay Lex, M.D., M.S. Interventional Cardiologist   Pager # [phone]

## 2015-04-01 ENCOUNTER — Telehealth: Payer: Self-pay | Admitting: Cardiology

## 2015-04-01 NOTE — Telephone Encounter (Signed)
Wants to know if her mother can have the EKG done on One day next Week, because they already planned to go out of town on tomorrow . Please call   Thanks

## 2015-04-01 NOTE — Telephone Encounter (Signed)
Attempted to return call from daughter; bad connection.  I will call back

## 2015-04-01 NOTE — Telephone Encounter (Signed)
Spoke daughter Would like to come in earlier than 2 pm  okay to come in at 9 am tomorrow. Verbalized understanding.

## 2015-04-02 ENCOUNTER — Encounter: Payer: Self-pay | Admitting: Cardiology

## 2015-04-02 ENCOUNTER — Ambulatory Visit (INDEPENDENT_AMBULATORY_CARE_PROVIDER_SITE_OTHER): Payer: Medicare HMO | Admitting: *Deleted

## 2015-04-02 VITALS — BP 116/80 | HR 122

## 2015-04-02 DIAGNOSIS — I4891 Unspecified atrial fibrillation: Secondary | ICD-10-CM | POA: Diagnosis not present

## 2015-04-02 NOTE — Progress Notes (Signed)
PATIENT IN OFFICE FOR EKG TODAY PER ORDER  EKG OBTAINED NOTIFIED DR HARDING.   PER ORDER,  CONTINUE WITH DIGOXN .625 MG THROUGH THE WEEKEND ALONG WITH INCREASING METOPROLOL TO 50 MG TWICE A DAY.THROUGH THE WEEKEND.  APPOINTMENT FOR Tuesday EKG.  PATIENT AND DAUGHTER VERBALIZED UNDERSTANDING.

## 2015-04-02 NOTE — Assessment & Plan Note (Signed)
I strongly reinforced her daughter's concerns about her eating and stressed importance of eating. She is eating yogurt only. Recommended [employer] Instant Breakfast.

## 2015-04-02 NOTE — Assessment & Plan Note (Signed)
EF by recent echo actually appeared to be pretty much back to normal. No active heart failure symptoms. She was normal blood pressure regimen or to consider ACE inhibitor therapy. She is on Lopressor for rate control. As she is not having active heart failure and EF seems to be improved. Twice a day Lopressor is probably more appropriate now.

## 2015-04-02 NOTE — Assessment & Plan Note (Signed)
Previous on amiodarone which was doing relatively well for rate control. Now need to increase beta blocker: She is no longer on amiodarone.  While I saw her she was on Pradaxa. This was changed to Xarelto and was subsequently changed to Eliquis her neurology records.. No bleeding issues.

## 2015-04-02 NOTE — Assessment & Plan Note (Addendum)
Very unfortunate situation. Despite our best efforts at keeping her anticoagulated, she had an event when she was holding. He does monitor. She is really not eating much since her stroke and has lost a lot of weight.   Thankfully most residual weaknesses and speech difficulties resolved with the exception of her memory loss and slow responsiveness.

## 2015-04-02 NOTE — Assessment & Plan Note (Signed)
Last checked in August. Cholesterol levels look great. Continue current dose of rosuvastatin. This was changed to generic rosuvastatin from Crestor.

## 2015-04-02 NOTE — Assessment & Plan Note (Signed)
Stable. No anginal symptoms despite having a fibular. She recently. Continue beta blocker and statin. For now continue aspirin as that of bleeding issues. We could easily stop aspirin necessary.

## 2015-04-02 NOTE — Assessment & Plan Note (Signed)
No longer an issue. Stable on beta blocker.

## 2015-04-02 NOTE — Patient Instructions (Signed)
Increase metoprolol to 50 mg twice a day through the weekend  Continue with taking digoxin 6.25 mg  Through the weekend  Today take .125 mg ( whole tablet) digoxin    Need an appointment next week for EKG.

## 2015-04-02 NOTE — Assessment & Plan Note (Signed)
She is clearly A. Fib with RVR today. Her amiodarone was stopped during her hospital stay. This was by the cardiology team thinking that it was there only for rate control. Stop for avoidance of adverse effects. Unfortunately now we'll have adequate rate control with beta blocker. Digoxin had been working well, but his dose was reduced and is now beginning at the other day. She is now on Eliquis, and taken kidney. I don't think that in order to try. Plan is to try to increase rate control.  She will increase the metoprolol to 50 mg in the evening with 25 mg in the morning. Additional 25 mg as needed for rapid rates. I talked about how to check her heart rate. She will take full dose digoxin for the next 3 days. Return for EKG check with nurse on Friday to reassess heart rate.

## 2015-04-04 HISTORY — PX: TRANSTHORACIC ECHOCARDIOGRAM: SHX275

## 2015-04-05 ENCOUNTER — Emergency Department (HOSPITAL_COMMUNITY): Payer: Medicare HMO

## 2015-04-05 ENCOUNTER — Encounter (HOSPITAL_COMMUNITY): Payer: Self-pay

## 2015-04-05 ENCOUNTER — Observation Stay (HOSPITAL_COMMUNITY)
Admission: EM | Admit: 2015-04-05 | Discharge: 2015-04-09 | Disposition: A | Discharge status: Home Health | Payer: Medicare HMO | Attending: Internal Medicine | Admitting: Internal Medicine

## 2015-04-05 DIAGNOSIS — I482 Chronic atrial fibrillation, unspecified: Secondary | ICD-10-CM

## 2015-04-05 DIAGNOSIS — Z87891 Personal history of nicotine dependence: Secondary | ICD-10-CM | POA: Insufficient documentation

## 2015-04-05 DIAGNOSIS — G9340 Encephalopathy, unspecified: Secondary | ICD-10-CM | POA: Insufficient documentation

## 2015-04-05 DIAGNOSIS — I255 Ischemic cardiomyopathy: Secondary | ICD-10-CM | POA: Diagnosis not present

## 2015-04-05 DIAGNOSIS — E785 Hyperlipidemia, unspecified: Secondary | ICD-10-CM | POA: Diagnosis not present

## 2015-04-05 DIAGNOSIS — I2510 Atherosclerotic heart disease of native coronary artery without angina pectoris: Secondary | ICD-10-CM | POA: Diagnosis present

## 2015-04-05 DIAGNOSIS — J690 Pneumonitis due to inhalation of food and vomit: Secondary | ICD-10-CM | POA: Insufficient documentation

## 2015-04-05 DIAGNOSIS — J189 Pneumonia, unspecified organism: Secondary | ICD-10-CM | POA: Diagnosis not present

## 2015-04-05 DIAGNOSIS — I480 Paroxysmal atrial fibrillation: Secondary | ICD-10-CM | POA: Diagnosis not present

## 2015-04-05 DIAGNOSIS — G459 Transient cerebral ischemic attack, unspecified: Principal | ICD-10-CM | POA: Diagnosis present

## 2015-04-05 DIAGNOSIS — Z951 Presence of aortocoronary bypass graft: Secondary | ICD-10-CM | POA: Diagnosis not present

## 2015-04-05 DIAGNOSIS — Z7982 Long term (current) use of aspirin: Secondary | ICD-10-CM | POA: Insufficient documentation

## 2015-04-05 DIAGNOSIS — I739 Peripheral vascular disease, unspecified: Secondary | ICD-10-CM | POA: Diagnosis present

## 2015-04-05 DIAGNOSIS — R262 Difficulty in walking, not elsewhere classified: Secondary | ICD-10-CM | POA: Diagnosis not present

## 2015-04-05 DIAGNOSIS — E039 Hypothyroidism, unspecified: Secondary | ICD-10-CM | POA: Diagnosis not present

## 2015-04-05 DIAGNOSIS — R471 Dysarthria and anarthria: Secondary | ICD-10-CM | POA: Insufficient documentation

## 2015-04-05 DIAGNOSIS — N390 Urinary tract infection, site not specified: Secondary | ICD-10-CM | POA: Diagnosis not present

## 2015-04-05 DIAGNOSIS — R29810 Facial weakness: Secondary | ICD-10-CM | POA: Insufficient documentation

## 2015-04-05 DIAGNOSIS — Z955 Presence of coronary angioplasty implant and graft: Secondary | ICD-10-CM | POA: Insufficient documentation

## 2015-04-05 DIAGNOSIS — J449 Chronic obstructive pulmonary disease, unspecified: Secondary | ICD-10-CM | POA: Diagnosis not present

## 2015-04-05 DIAGNOSIS — R059 Cough, unspecified: Secondary | ICD-10-CM

## 2015-04-05 DIAGNOSIS — G451 Carotid artery syndrome (hemispheric): Secondary | ICD-10-CM

## 2015-04-05 DIAGNOSIS — R05 Cough: Secondary | ICD-10-CM | POA: Insufficient documentation

## 2015-04-05 DIAGNOSIS — I639 Cerebral infarction, unspecified: Secondary | ICD-10-CM

## 2015-04-05 DIAGNOSIS — Z8673 Personal history of transient ischemic attack (TIA), and cerebral infarction without residual deficits: Secondary | ICD-10-CM | POA: Diagnosis not present

## 2015-04-05 DIAGNOSIS — R4781 Slurred speech: Secondary | ICD-10-CM | POA: Diagnosis not present

## 2015-04-05 DIAGNOSIS — Z7901 Long term (current) use of anticoagulants: Secondary | ICD-10-CM | POA: Insufficient documentation

## 2015-04-05 DIAGNOSIS — I10 Essential (primary) hypertension: Secondary | ICD-10-CM | POA: Insufficient documentation

## 2015-04-05 DIAGNOSIS — N3000 Acute cystitis without hematuria: Secondary | ICD-10-CM | POA: Insufficient documentation

## 2015-04-05 DIAGNOSIS — I4821 Permanent atrial fibrillation: Secondary | ICD-10-CM | POA: Diagnosis present

## 2015-04-05 HISTORY — DX: Transient cerebral ischemic attack, unspecified: G45.9

## 2015-04-05 LAB — I-STAT CHEM 8, ED
BUN: 15 mg/dL (ref 6–20)
CALCIUM ION: 1.16 mmol/L (ref 1.13–1.30)
CHLORIDE: 101 mmol/L (ref 101–111)
CREATININE: 0.9 mg/dL (ref 0.44–1.00)
GLUCOSE: 81 mg/dL (ref 65–99)
HCT: 39 % (ref 36.0–46.0)
Hemoglobin: 13.3 g/dL (ref 12.0–15.0)
Potassium: 3.8 mmol/L (ref 3.5–5.1)
Sodium: 140 mmol/L (ref 135–145)
TCO2: 27 mmol/L (ref 0–100)

## 2015-04-05 LAB — CBC
HEMATOCRIT: 36.4 % (ref 36.0–46.0)
HEMOGLOBIN: 10.4 g/dL — AB (ref 12.0–15.0)
MCH: 19.5 pg — ABNORMAL LOW (ref 26.0–34.0)
MCHC: 28.6 g/dL — AB (ref 30.0–36.0)
MCV: 68.3 fL — AB (ref 78.0–100.0)
Platelets: 202 10*3/uL (ref 150–400)
RBC: 5.33 MIL/uL — ABNORMAL HIGH (ref 3.87–5.11)
RDW: 17.1 % — AB (ref 11.5–15.5)
WBC: 6.7 10*3/uL (ref 4.0–10.5)

## 2015-04-05 LAB — I-STAT TROPONIN, ED: TROPONIN I, POC: 0 ng/mL (ref 0.00–0.08)

## 2015-04-05 LAB — DIFFERENTIAL
BASOS ABS: 0 10*3/uL (ref 0.0–0.1)
BASOS PCT: 0 %
EOS PCT: 3 %
Eosinophils Absolute: 0.2 10*3/uL (ref 0.0–0.7)
LYMPHS ABS: 1.3 10*3/uL (ref 0.7–4.0)
Lymphocytes Relative: 19 %
MONO ABS: 0.5 10*3/uL (ref 0.1–1.0)
MONOS PCT: 8 %
Neutro Abs: 4.7 10*3/uL (ref 1.7–7.7)
Neutrophils Relative %: 70 %

## 2015-04-05 LAB — COMPREHENSIVE METABOLIC PANEL
ALBUMIN: 3.7 g/dL (ref 3.5–5.0)
ALK PHOS: 67 U/L (ref 38–126)
ALT: 10 U/L — ABNORMAL LOW (ref 14–54)
AST: 20 U/L (ref 15–41)
Anion gap: 10 (ref 5–15)
BILIRUBIN TOTAL: 0.8 mg/dL (ref 0.3–1.2)
BUN: 14 mg/dL (ref 6–20)
CALCIUM: 9.2 mg/dL (ref 8.9–10.3)
CO2: 27 mmol/L (ref 22–32)
Chloride: 104 mmol/L (ref 101–111)
Creatinine, Ser: 0.84 mg/dL (ref 0.44–1.00)
GFR calc Af Amer: >60 mL/min (ref >60)
GLUCOSE: 86 mg/dL (ref 65–99)
POTASSIUM: 4 mmol/L (ref 3.5–5.1)
Sodium: 141 mmol/L (ref 135–145)
TOTAL PROTEIN: 6.5 g/dL (ref 6.5–8.1)

## 2015-04-05 LAB — APTT: aPTT: 45 seconds — ABNORMAL HIGH (ref 24–37)

## 2015-04-05 LAB — PROTIME-INR
INR: 1.74 — ABNORMAL HIGH (ref 0.00–1.49)
Prothrombin Time: 20.3 seconds — ABNORMAL HIGH (ref 11.6–15.2)

## 2015-04-05 LAB — CBG MONITORING, ED: Glucose-Capillary: 77 mg/dL (ref 65–99)

## 2015-04-05 MED ORDER — STROKE: EARLY STAGES OF RECOVERY BOOK
Freq: Once | Status: DC
  Filled 2015-04-05: qty 1

## 2015-04-05 MED ORDER — APIXABAN 5 MG PO TABS
5.0000 mg | ORAL_TABLET | Freq: Two times a day (BID) | ORAL | Status: DC
  Administered 2015-04-06 – 2015-04-09 (×8): 5 mg via ORAL
  Filled 2015-04-05 (×8): qty 1

## 2015-04-05 MED ORDER — SODIUM CHLORIDE 0.9 % IV BOLUS (SEPSIS)
500.0000 mL | Freq: Once | INTRAVENOUS | Status: AC
  Administered 2015-04-05: 500 mL via INTRAVENOUS

## 2015-04-05 MED ORDER — CYCLOBENZAPRINE HCL 10 MG PO TABS
10.0000 mg | ORAL_TABLET | Freq: Three times a day (TID) | ORAL | Status: DC | PRN
  Administered 2015-04-06 – 2015-04-09 (×5): 10 mg via ORAL
  Filled 2015-04-05 (×6): qty 1

## 2015-04-05 MED ORDER — DIGOXIN 125 MCG PO TABS
0.1250 mg | ORAL_TABLET | Freq: Every day | ORAL | Status: DC
  Administered 2015-04-06: 0.125 mg via ORAL
  Filled 2015-04-05: qty 1

## 2015-04-05 MED ORDER — ROSUVASTATIN CALCIUM 5 MG PO TABS
5.0000 mg | ORAL_TABLET | Freq: Every evening | ORAL | Status: DC
  Administered 2015-04-06 – 2015-04-08 (×3): 5 mg via ORAL
  Filled 2015-04-05 (×5): qty 1

## 2015-04-05 MED ORDER — METOPROLOL TARTRATE 25 MG PO TABS
25.0000 mg | ORAL_TABLET | Freq: Two times a day (BID) | ORAL | Status: DC
Start: 2015-04-05 — End: 2015-04-06
  Administered 2015-04-06: 25 mg via ORAL
  Filled 2015-04-05: qty 1

## 2015-04-05 MED ORDER — LUBIPROSTONE 24 MCG PO CAPS
24.0000 ug | ORAL_CAPSULE | Freq: Two times a day (BID) | ORAL | Status: DC
  Administered 2015-04-06 – 2015-04-09 (×8): 24 ug via ORAL
  Filled 2015-04-05 (×8): qty 1

## 2015-04-05 MED ORDER — SENNOSIDES-DOCUSATE SODIUM 8.6-50 MG PO TABS: 1.0000 | ORAL_TABLET | Freq: Every evening | ORAL | Status: DC | PRN

## 2015-04-05 MED ORDER — ASPIRIN EC 81 MG PO TBEC
81.0000 mg | DELAYED_RELEASE_TABLET | Freq: Every day | ORAL | Status: DC
  Administered 2015-04-06 – 2015-04-09 (×4): 81 mg via ORAL
  Filled 2015-04-05 (×4): qty 1

## 2015-04-05 MED ORDER — LEVOTHYROXINE SODIUM 75 MCG PO TABS
75.0000 ug | ORAL_TABLET | Freq: Every day | ORAL | Status: DC
  Administered 2015-04-06 – 2015-04-09 (×4): 75 ug via ORAL
  Filled 2015-04-05 (×4): qty 1

## 2015-04-05 MED ORDER — HYDROCODONE-ACETAMINOPHEN 5-325 MG PO TABS
1.0000 | ORAL_TABLET | Freq: Four times a day (QID) | ORAL | Status: DC | PRN
  Administered 2015-04-06 – 2015-04-09 (×11): 1 via ORAL
  Filled 2015-04-05 (×11): qty 1

## 2015-04-05 NOTE — H&P (Signed)
[facility] History and Physical  [name] ZOX:[id number] DOB: 08/08/1940 DOA: 04/05/2015  Referring physician: Mr.Tran. PCP: Salli RealSUN,YUN, MD  Specialists: Dr.Harding. Cardiologist.  Chief Complaint: Difficulty walking.  HPI: [name] is a 75 y.o. 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 Patient has been having these symptoms since morning today. In addition patient was noticed to have some dysarthria. CT of the head did not show anything acute and on exam patient appears nonfocal. On-call neurologist Dr. Cyril Mourningamillo was consulted and at this time neurologist has recommended MRI of brain and no further stroke workup as patient has had recent workup done during her last admission for stroke. Patient edition has been having left upper back pain which patient states has been chronic. Patient's heart rate is mildly elevated and is in A. fib. Rate is around 115 to 120.   Review of Systems: As presented in the history of presenting illness, rest negative.  Past Medical History  Diagnosis Date  . S/P CABG x 2 2009    LIMA-LAD, SVG-RCA, with AV fistula ligation and Maze procedure  . CAD (coronary artery disease), native coronary artery 2009    75% LAD, diffuse 75-90% RCA --> Referred for CABG + MAZE;  Cardiolite 12/2013: No ischemia or Infarction  . Ischemic cardiomyopathy 2012    EF ~40-45% by Echo  . PAF (paroxysmal atrial fibrillation) (HCC) 04/05/2012    s/p MAZE -- recurrence, cardioversion-converted to sinus bradycardia --> Now persistent despite being on Amiodarone & Pradaxa  . Essential hypertension   . Dyslipidemia, goal LDL below 70     On Crestor, followed by PCP  . COPD (chronic obstructive pulmonary disease) (HCC)   . PAD (peripheral artery disease) (HCC) 2007    s/p L Ileac A stent ; most recent Dopplers July 2012: Less than 50% reduction  bilaterally. ABI 0.96 on the right 0.88 on left;;;12/27/2011   -ABI right .87 and left ABI .78  ,LEFT CIA and EIA stent normall patency, left CFA,SFA,and popliteal 0-49%; rgt proximal SFA 50-69%,rgt CIA,EIA, and CFA 0-49%  . Chronic low back pain     scoliosis & lordosis  . H/O: pneumonia   . Hypothyroidism   . Anxiety   . Cervical neck pain with evidence of disc disease     with need for surgery -- November 2015  . Osteoarthritis of back     And neck, hands,spine  . H/O ischemic left MCA stroke 11/18/2014    MRI/MRA of head: Multifocal left MCA infarct.. Also possible left ACA territory infarct -- complete occlusion of left MCA distally at M2 level. Marked left ACA attenuation.   Past Surgical History  Procedure Laterality Date  . Iliac artery stent Left 04/06/2005    Ex Iliac - CFA (Smart STENTS -- 7x4 in EIA, 6 x 3 CFA)   . Coronary artery bypass graft  2009    LIMA-LAD, SVG-RCA (also MAZE & AV fistula ligation)  . Maze  2009    along with CABG  . Cardioversion  04/05/2012    Procedure: CARDIOVERSION;  Surgeon: Thurmon FairMihai Croitoru, MD;  [facility];  Service: Cardiovascular;  Laterality: N/A;  . Transthoracic echocardiogram  Jan 2012    EF ~40-45%, global HK; PAP ~45-50 mmHg  . Nm myoview ltd  May 2013    No ischemia or Infarct  . Abdominal hysterectomy    . Cardiac catheterization  07/30/2007  75% LAD ,3 stenoses of 75-90% in  RCA;circumflex from proximal RCA with no lesion seen,normal ramus intermediate branh; normal LV systoilc function  . Nm cardiolite ltd  12/2013    Non-gated for Afib; No Ischemia or Infarction.  Marland Kitchen Anterior cervical decomp/discectomy fusion N/A 01/23/2014    Procedure: ANTERIOR CERVICAL DECOMPRESSION/DISCECTOMY FUSION CERVICAL FOUR-FIVE,CERVICAL FIVE-SIX,CERVICAL SIX-SEVEN;  Surgeon: Mariam Dollar, MD;  Location: [facility]UR[facility];  Service: Neurosurgery;  Laterality: N/A;  . Colonoscopy with propofol N/A 11/12/2014    Procedure: COLONOSCOPY WITH PROPOFOL;   Surgeon: Charna Elizabeth, MD;  Location: [facility] ENDOS[facility];  Service: Endoscopy;  Laterality: N/A;   Social History:  reports that she quit smoking about 17 years ago. Her smoking use included Cigarettes. She smoked 0.50 packs per day. She has never used smokeless tobacco. She reports that she does not drink alcohol or use illicit drugs. Where does patient live home. Can patient participate in ADLs? Yes.  No Known Allergies  Family History:  Family History  Problem Relation Age of Onset  . Heart attack Mother   . Stroke Father   . Heart disease Brother 60      Prior to Admission medications   Medication Sig Start Date End Date Taking? Authorizing Provider  AMITIZA 24 MCG capsule Take 24 mcg by mouth 2 (two) times daily. 03/17/15  Yes Historical Provider, MD  apixaban (ELIQUIS) 5 MG TABS tablet Take 1 tablet (5 mg total) by mouth 2 (two) times daily. 11/19/14  Yes Joseph Art, DO  aspirin EC 81 MG EC tablet Take 1 tablet (81 mg total) by mouth daily. 11/19/14  Yes Joseph Art, DO  CRESTOR 5 MG tablet TAKE 1 TABLET BY MOUTH IN THE EVENING 10/19/14  Yes Marykay Lex, MD  cyclobenzaprine (FLEXERIL) 10 MG tablet Take 10 mg by mouth 3 (three) times daily as needed for muscle spasms.   Yes Historical Provider, MD  digoxin (LANOXIN) 0.125 MG tablet Take 0.5 tablets (0.0625 mg total) by mouth daily. Patient taking differently: Take 0.125 mg by mouth daily.  03/31/15  Yes Marykay Lex, MD  HYDROcodone-acetaminophen (NORCO/VICODIN) 5-325 MG per tablet Take 1 tablet by mouth every 6 (six) hours as needed for moderate pain. 11/19/14  Yes Joseph Art, DO  levothyroxine (SYNTHROID, LEVOTHROID) 75 MCG tablet Take 75 mcg by mouth daily before breakfast.   Yes Historical Provider, MD  metoprolol (LOPRESSOR) 50 MG tablet TAKE 0.5 TABLETS (25 MG TOTAL) BY MOUTH 2 (TWO) TIMES DAILY. Patient taking differently: TAKE 1 TABLETS (50 MG TOTAL) BY MOUTH 2 (TWO) TIMES DAILY. 01/25/15  Yes Marykay Lex, MD   bisacodyl (DULCOLAX) 10 MG suppository Place 1 suppository (10 mg total) rectally daily as needed for moderate constipation. Patient not taking: Reported on 04/05/2015 11/19/14   Joseph Art, DO  diazepam (VALIUM) 2 MG tablet Take 1 tablet (2 mg total) by mouth every 8 (eight) hours as needed for muscle spasms. Patient not taking: Reported on 04/05/2015 11/03/14   Blake Divine, MD  docusate sodium (COLACE) 100 MG capsule Take 1 capsule (100 mg total) by mouth every 12 (twelve) hours. Patient not taking: Reported on 04/05/2015 06/13/14   Derwood Kaplan, MD  feeding supplement, ENSURE ENLIVE, (ENSURE ENLIVE) LIQD Take 237 mLs by mouth 2 (two) times daily between meals. Patient not taking: Reported on 04/05/2015 11/19/14   Joseph Art, DO  lactulose (CHRONULAC) 10 GM/15ML solution Take 15 mLs (10 g total) by mouth 2 (two) times daily as  needed for mild constipation. Patient not taking: Reported on 04/05/2015 11/19/14   Joseph Art, DO  metoprolol (LOPRESSOR) 50 MG tablet TAKE 25 MG (1/2TABLET) IN MORNING , 50 MG IN THE EVENING Patient not taking: Reported on 04/05/2015 03/31/15   Marykay Lex, MD    Physical Exam: Filed Vitals:   04/05/15 2040 04/05/15 2041 04/05/15 2045 04/05/15 2122  BP: 127/80  131/96 136/93  Pulse:    74  Temp:  97.6 F (36.4 C)  97.5 F (36.4 C)  TempSrc:    Oral  Resp:   17 16  Height:    5' (1.524 m)  Weight:    38.7 kg (85 lb 5.1 oz)  SpO2: 99%   96%     General:  More built and poorly nourished.  Eyes: Anicteric no pallor.  ENT: No discharge from the ears eyes nose and mouth.  Neck: No mass felt. No JVD appreciated.  Cardiovascular: S1 and S2 heard.  Respiratory: No rhonchi or crepitations.  Abdomen: Soft nontender bowel sounds present.  Skin: No rash.  Musculoskeletal: No edema.  Psychiatric: Appears normal.  Neurologic: Alert awake oriented to time place and person. Moves all extremities 5 x 5. No facial asymmetry. Tongue is midline. PERRLA  positive.  Labs on Admission:  Basic Metabolic Panel:  Recent Labs Lab 04/05/15 1652 04/05/15 1702  NA 141 140  K 4.0 3.8  CL 104 101  CO2 27  --   GLUCOSE 86 81  BUN 14 15  CREATININE 0.84 0.90  CALCIUM 9.2  --    Liver Function Tests:  Recent Labs Lab 04/05/15 1652  AST 20  ALT 10*  ALKPHOS 67  BILITOT 0.8  PROT 6.5  ALBUMIN 3.7   No results for input(s): LIPASE, AMYLASE in the last 168 hours. No results for input(s): AMMONIA in the last 168 hours. CBC:  Recent Labs Lab 04/05/15 1652 04/05/15 1702  WBC 6.7  --   NEUTROABS 4.7  --   HGB 10.4* 13.3  HCT 36.4 39.0  MCV 68.3*  --   PLT 202  --    Cardiac Enzymes: No results for input(s): CKTOTAL, CKMB, CKMBINDEX, TROPONINI in the last 168 hours.  BNP (last 3 results)  Recent Labs  05/02/14 1902  BNP 344.0*    ProBNP (last 3 results) No results for input(s): PROBNP in the last 8760 hours.  CBG:  Recent Labs Lab 04/05/15 1647  GLUCAP 77    Radiological Exams on Admission: Ct Head Wo Contrast  04/05/2015  CLINICAL DATA:  SLURRED SPEECH THIS AM PER FAMILY. PATIENT HAS NOT NOTICED SYMPTOMS. HX OF STROKE S/P CABG x 2. EXAM: CT HEAD WITHOUT CONTRAST TECHNIQUE: Contiguous axial images were obtained from the base of the skull through the vertex without intravenous contrast. COMPARISON:  11/18/2014 CT and MRI FINDINGS: Low-attenuation identified within the left frontal lobe, consistent with previous middle cerebral infarct, which has shown evolution since prior CT exam. There is periventricular white matter change. Small lacunar infarct within the right basal ganglia is stable. No evidence for acute infarction or intra cerebral hemorrhage. No intra or extra-axial fluid collection or mass. Bone windows demonstrate significant atherosclerotic calcification of the internal carotid arteries. There is mild mucoperiosteal thickening of the ethmoid air cells. No acute calvarial abnormality identified. IMPRESSION: 1.  Changes consistent with old left middle cerebral infarct. 2.  No evidence for acute intracranial abnormality. 3. Atrophy and small vessel disease. 4. Mild chronic sinusitis. Electronically Signed   By:  Norva Pavlov M.D.   On: 04/05/2015 19:15    EKG: Independently reviewed. A. fib with rate around 90 bpm.  Assessment/Plan Principal Problem:   TIA (transient ischemic attack) Active Problems:   2 Vessel CAD - s/p CABG; LIMA-LAD, SVG-RCA   Chronic atrial fibrillation (HCC)   Hyperlipidemia with target LDL less than 70   Hypothyroidism   Ischemic cardiomyopathy   PAD (peripheral artery disease) (HCC)   S/P CABG x 2   1. TIA - appreciate neurology consult. At this time patient appears nonfocal. MRI brain has been ordered and no further workup as per radiology. Patient is on Apixaban which will be continued. 2. A. fib with rate mildly elevated - patient's night dose of metoprolol has been ordered. If it still increase then I will increase metoprolol dose from 25 mg twice a day to 37.5 mg twice a day. Patient last time had toxicity from amiodarone. Patient also on digoxin levels of which I have ordered. Chads 2 vasc score is 7. Patient is on Apixaban. 3. CAD status post CABG - has some left upper back pain. Check troponin. 4. Hypothyroidism - since patient's heart rate is elevated will check TSH. 5. Ischemic cardiomyopathy last EF was around 55% in August 2016 - appears compensated. 6. Hyperlipidemia statins.   DVT Prophylaxis Apixaban.  Code Status: Full code.  Family Communication: Discussed with patient.  Disposition Plan: Admit for observation.    Rilee Knoll N. [facility] Pager [phone].  If 7PM-7AM, please contact night-coverage [email or web address] Password [facility] 04/05/2015, 11:23 PM

## 2015-04-05 NOTE — ED Provider Notes (Signed)
CSN: [id number]     Arrival date & time 04/05/15  1641 History   First MD Initiated Contact with Patient 04/05/15 1828     Chief Complaint  Patient presents with  . Aphasia  . unsteady gait      (Consider location/radiation/quality/duration/timing/severity/associated sxs/prior Treatment) HPI   75 year old female with history of atrial fibrillation currently on Eliquis, history of ischemic left MCA stroke this past October, CAD, ischemic cardiomyopathy, PAD, COPD presenting to the ED for evaluation of aphasia and unsteady gait. History obtained by daughter who is at bedside. Daughter reported patient was last normal at 8 PM last night. Daughter noticed that patient have slurred speech and unsteady gait around 1 PM this afternoon when she woke up from a nap which concerns her. Patient was having trouble with her speech and does not make any sense. Aside from sharp achy pain to the left side of her upper back which has been ongoing for the past several days patient denies any other symptoms. She does not complain of any headache, vision changes, difficulty thinking, trouble speaking, neck pain, chest pain, shortness of breath, heart palpitation, abdominal pain, nausea vomiting diarrhea, dysuria, focal numbness or weakness. She has been taking Vicodin for her back pain which was prescribed by Dr. for back pain. She mentioned Vicodin does help.  Past Medical History  Diagnosis Date  . S/P CABG x 2 2009    LIMA-LAD, SVG-RCA, with AV fistula ligation and Maze procedure  . CAD (coronary artery disease), native coronary artery 2009    75% LAD, diffuse 75-90% RCA --> Referred for CABG + MAZE;  Cardiolite 12/2013: No ischemia or Infarction  . Ischemic cardiomyopathy 2012    EF ~40-45% by Echo  . PAF (paroxysmal atrial fibrillation) (HCC) 04/05/2012    s/p MAZE -- recurrence, cardioversion-converted to sinus bradycardia --> Now persistent despite being on Amiodarone & Pradaxa  . Essential hypertension   .  Dyslipidemia, goal LDL below 70     On Crestor, followed by PCP  . COPD (chronic obstructive pulmonary disease) (HCC)   . PAD (peripheral artery disease) (HCC) 2007    s/p L Ileac A stent ; most recent Dopplers July 2012: Less than 50% reduction bilaterally. ABI 0.96 on the right 0.88 on left;;;12/27/2011   -ABI right .87 and left ABI .78  ,LEFT CIA and EIA stent normall patency, left CFA,SFA,and popliteal 0-49%; rgt proximal SFA 50-69%,rgt CIA,EIA, and CFA 0-49%  . Chronic low back pain     scoliosis & lordosis  . H/O: pneumonia   . Hypothyroidism   . Anxiety   . Cervical neck pain with evidence of disc disease     with need for surgery -- November 2015  . Osteoarthritis of back     And neck, hands,spine  . H/O ischemic left MCA stroke 11/18/2014    MRI/MRA of head: Multifocal left MCA infarct.. Also possible left ACA territory infarct -- complete occlusion of left MCA distally at M2 level. Marked left ACA attenuation.   Past Surgical History  Procedure Laterality Date  . Iliac artery stent Left 04/06/2005    Ex Iliac - CFA (Smart STENTS -- 7x4 in EIA, 6 x 3 CFA)   . Coronary artery bypass graft  2009    LIMA-LAD, SVG-RCA (also MAZE & AV fistula ligation)  . Maze  2009    along with CABG  . Cardioversion  04/05/2012    Procedure: CARDIOVERSION;  Surgeon: Thurmon Fair, MD;  Location: [facility];  Service:  Cardiovascular;  Laterality: N/A;  . Transthoracic echocardiogram  Jan 2012    EF ~40-45%, global HK; PAP ~45-50 mmHg  . Nm myoview ltd  May 2013    No ischemia or Infarct  . Abdominal hysterectomy    . Cardiac catheterization  07/30/2007    75% LAD ,3 stenoses of 75-90% in  RCA;circumflex from proximal RCA with no lesion seen,normal ramus intermediate branh; normal LV systoilc function  . Nm cardiolite ltd  12/2013    Non-gated for Afib; No Ischemia or Infarction.  Marland Kitchen Anterior cervical decomp/discectomy fusion N/A 01/23/2014    Procedure: ANTERIOR CERVICAL DECOMPRESSION/DISCECTOMY  FUSION CERVICAL FOUR-FIVE,CERVICAL FIVE-SIX,CERVICAL SIX-SEVEN;  Surgeon: Mariam Dollar, MD;  Location: [facility];  Service: Neurosurgery;  Laterality: N/A;  . Colonoscopy with propofol N/A 11/12/2014    Procedure: COLONOSCOPY WITH PROPOFOL;  Surgeon: Charna Elizabeth, MD;  Location: [facility];  Service: Endoscopy;  Laterality: N/A;   Family History  Problem Relation Age of Onset  . Heart attack Mother   . Stroke Father   . Heart disease Brother 47   Social History  Substance Use Topics  . Smoking status: Former Smoker -- 0.50 packs/day    Types: Cigarettes    Quit date: 01/07/1998  . Smokeless tobacco: Never Used  . Alcohol Use: No   OB History    No data available     Review of Systems  All other systems reviewed and are negative.     Allergies  Review of patient's allergies indicates no known allergies.  Home Medications   Prior to Admission medications   Medication Sig Start Date End Date Taking? Authorizing Provider  apixaban (ELIQUIS) 5 MG TABS tablet Take 1 tablet (5 mg total) by mouth 2 (two) times daily. 11/19/14   Joseph Art, DO  aspirin EC 81 MG EC tablet Take 1 tablet (81 mg total) by mouth daily. 11/19/14   Joseph Art, DO  bisacodyl (DULCOLAX) 10 MG suppository Place 1 suppository (10 mg total) rectally daily as needed for moderate constipation. 11/19/14   Joseph Art, DO  Cholecalciferol (VITAMIN D) 2000 UNITS CAPS Take 2,000 Units by mouth daily.    Historical Provider, MD  CRESTOR 5 MG tablet TAKE 1 TABLET BY MOUTH IN THE EVENING 10/19/14   Marykay Lex, MD  cyclobenzaprine (FLEXERIL) 10 MG tablet Take 10 mg by mouth 3 (three) times daily as needed for muscle spasms.    Historical Provider, MD  diazepam (VALIUM) 2 MG tablet Take 1 tablet (2 mg total) by mouth every 8 (eight) hours as needed for muscle spasms. Patient taking differently: Take 2 mg by mouth at bedtime.  11/03/14   Blake Divine, MD  digoxin (LANOXIN) 0.125 MG tablet Take 0.5 tablets  (0.0625 mg total) by mouth daily. 03/31/15   Marykay Lex, MD  docusate sodium (COLACE) 100 MG capsule Take 1 capsule (100 mg total) by mouth every 12 (twelve) hours. 06/13/14   Derwood Kaplan, MD  feeding supplement, ENSURE ENLIVE, (ENSURE ENLIVE) LIQD Take 237 mLs by mouth 2 (two) times daily between meals. 11/19/14   Joseph Art, DO  gabapentin (NEURONTIN) 300 MG capsule Take 300 mg by mouth at bedtime. 10/03/14   Historical Provider, MD  HYDROcodone-acetaminophen (NORCO/VICODIN) 5-325 MG per tablet Take 1 tablet by mouth every 6 (six) hours as needed for moderate pain. 11/19/14   Joseph Art, DO  lactulose (CHRONULAC) 10 GM/15ML solution Take 15 mLs (10 g total) by mouth 2 (two)  times daily as needed for mild constipation. 11/19/14   Joseph ArtJessica U Vann, DO  levothyroxine (SYNTHROID, LEVOTHROID) 75 MCG tablet Take 75 mcg by mouth daily before breakfast.    Historical Provider, MD  metoprolol (LOPRESSOR) 50 MG tablet TAKE 0.5 TABLETS (25 MG TOTAL) BY MOUTH 2 (TWO) TIMES DAILY. 01/25/15   Marykay Lexavid W Harding, MD  metoprolol (LOPRESSOR) 50 MG tablet TAKE 25 MG (1/2TABLET) [place] , 50 MG IN THE EVENING 03/31/15   Marykay Lexavid W Harding, MD   BP 111/90 mmHg  Pulse 67  Temp(Src) 97.7 F (36.5 C) (Oral)  Resp 18  Ht 5' (1.524 m)  Wt 39.009 kg  BMI 16.80 kg/m2  SpO2 99% Physical Exam  Constitutional: She appears well-developed and well-nourished. No distress.  Elderly Caucasian female appears to be in no acute discomfort.  HENT:  Head: Atraumatic.  Mouth/Throat: Oropharynx is clear and moist.  Eyes: Conjunctivae and EOM are normal. Pupils are equal, round, and reactive to light.  Neck: Neck supple.  No nuchal rigidity  Cardiovascular:  Irregularly irregular heart rhythm without murmurs rubs or gallops  Pulmonary/Chest: Effort normal and breath sounds normal.  Abdominal: Soft. Bowel sounds are normal. There is no tenderness.  Musculoskeletal: She exhibits tenderness (Tenderness to left parathoracic  muscle on palpation without any significant midline spine tenderness.-).  Neurological: She is alert.  Pt is alert to [place] and place, but gave wrong answer to year, or current president.    Speech clear with [place] accent, pupils equal round reactive to light, extraocular movements intact  Normal peripheral visual fields Cranial nerves III through XII normal including no facial droop Follows commands, moves all extremities x4, normal strength to bilateral upper and lower extremities at all major muscle groups including grip Sensation normal to light touch  Coordination intact, no limb ataxia, finger-nose-finger normal Rapid alternating movements normal No pronator drift Gait normal   Skin: No rash noted.  Psychiatric: She has a normal mood and affect.  Nursing note and vitals reviewed.   ED Course  Procedures (including critical care time) Labs Review Labs Reviewed  PROTIME-INR - Abnormal; Notable for the following:    Prothrombin Time 20.3 (*)    INR 1.74 (*)    All other components within normal limits  APTT - Abnormal; Notable for the following:    aPTT 45 (*)    All other components within normal limits  CBC - Abnormal; Notable for the following:    RBC 5.33 (*)    Hemoglobin 10.4 (*)    MCV 68.3 (*)    MCH 19.5 (*)    MCHC 28.6 (*)    RDW 17.1 (*)    All other components within normal limits  COMPREHENSIVE METABOLIC PANEL - Abnormal; Notable for the following:    ALT 10 (*)    All other components within normal limits  DIFFERENTIAL  I-STAT TROPOININ, ED  CBG MONITORING, ED  I-STAT CHEM 8, ED    Imaging Review Ct Head Wo Contrast  04/05/2015  CLINICAL DATA:  SLURRED SPEECH THIS AM PER FAMILY. PATIENT HAS NOT NOTICED SYMPTOMS. HX OF STROKE S/P CABG x 2. EXAM: CT HEAD WITHOUT CONTRAST TECHNIQUE: Contiguous axial images were obtained from the base of the skull through the vertex without intravenous contrast. COMPARISON:  11/18/2014 CT and MRI FINDINGS:  Low-attenuation identified within the left frontal lobe, consistent with previous middle cerebral infarct, which has shown evolution since prior CT exam. There is periventricular white matter change. Small lacunar infarct within the  right basal ganglia is stable. No evidence for acute infarction or intra cerebral hemorrhage. No intra or extra-axial fluid collection or mass. Bone windows demonstrate significant atherosclerotic calcification of the internal carotid arteries. There is mild mucoperiosteal thickening of the ethmoid air cells. No acute calvarial abnormality identified. IMPRESSION: 1. Changes consistent with old left middle cerebral infarct. 2.  No evidence for acute intracranial abnormality. 3. Atrophy and small vessel disease. 4. Mild chronic sinusitis. Electronically Signed   By: Norva Pavlov M.D.   On: 04/05/2015 19:15   I have personally reviewed and evaluated these images and lab results as part of my medical decision-making.   EKG Interpretation   Date/Time:  Monday April 05 2015 16:54:43 EST Ventricular Rate:  99 PR Interval:    QRS Duration: 84 QT Interval:  356 QTC Calculation: 456 R Axis:   66 Text Interpretation:  Atrial fibrillation with premature ventricular or  aberrantly conducted complexes Possible Anteroseptal infarct , age  undetermined Abnormal ECG since last tracing no significant change  Confirmed by MILLER  MD, BRIAN ([id number]) on 04/05/2015 7:01:12 PM      MDM   Final diagnoses:  Transient cerebral ischemia, unspecified transient cerebral ischemia type    BP 100/61 mmHg  Pulse 73  Temp(Src) 97.7 F (36.5 C) (Oral)  Resp 16  Ht 5' (1.524 m)  Wt 39.009 kg  BMI 16.80 kg/m2  SpO2 100%   6:48 PM Patient with history of prior stroke early on and request presenting for evaluation of trouble with speech and unsteady gait. Patient has no focal neuro deficit on exam. She has no active headache. She admittedly have been taking narcotic pain medication  for her back which may contribute to her presenting complaint. Her last normal was last night. Pt is back to baseline.  TIA work up initiated.  Care discussed with Dr. Hyacinth Meeker.    8:28 PM Her CT scan showed no acute finding concerning for hemorrhagic stroke. Appreciate consultation from [facility]ad hospitalist, Dr. Toniann Fail who agrees to see pt in the ER and will admit patient to a telemetry bed under his care. He also recommend neurology to be involved. I will consult neurologist. Care discussed with patient who agrees with plan.   8:36 PM Appreciate consultation from neurologist Dr. Cyril Mourning who agrees to be involved in the pt care.   Fayrene Helper, PA-C 04/05/15 2036  Eber Hong, MD 04/06/15 [phone]

## 2015-04-05 NOTE — ED Notes (Signed)
Daughter reports that patient went to bed at 8pm last night and daughter noticed that she was unsteady and had slurred speech when she awoke this afternoon at 1pm. Patient alert to person but confused to place and time

## 2015-04-05 NOTE — Consult Note (Signed)
Referring Physician: ED    Chief Complaint: unsteadiness, dysarthria, drowsiness  HPI:                                                                                                                                         [name] is an 75 y.o. female with a past medical history significant for HTN, hyperlipidemia, CAD s.p CABG, ischemic cardiomyopathy, PAF on apixaban (previously treated with xarelto and pradaxa), left MCA embolic infarct 99/24, and PAD, brought in by EMS due to acute onset of the aforementioned symptoms. Patient daughter is at the bedside and said that for few hours this morning patient was " too sleepy, no speaking well, and off balance". Further, she was complaining of HA which is unusual for her. Denies associated vertigo, double vision, difficulty swallowing, focal weakness or numbness, confusion, or vision impairment. Taking apixaban religiously. CT head was personally reviewed and showed no acute abnormality.  Date last known well: 04/04/15 Time last known well: 8 pm tPA Given: no, symptoms resolved   Past Medical History  Diagnosis Date  . S/P CABG x 2 2009    LIMA-LAD, SVG-RCA, with AV fistula ligation and Maze procedure  . CAD (coronary artery disease), native coronary artery 2009    75% LAD, diffuse 75-90% RCA --> Referred for CABG + MAZE;  Cardiolite 12/2013: No ischemia or Infarction  . Ischemic cardiomyopathy 2012    EF ~40-45% by Echo  . PAF (paroxysmal atrial fibrillation) (HCC) 04/05/2012    s/p MAZE -- recurrence, cardioversion-converted to sinus bradycardia --> Now persistent despite being on Amiodarone & Pradaxa  . Essential hypertension   . Dyslipidemia, goal LDL below 70     On Crestor, followed by PCP  . COPD (chronic obstructive pulmonary disease) (HCC)   . PAD (peripheral artery disease) (HCC) 2007    s/p L Ileac A stent ; most recent Dopplers July 2012: Less than 50% reduction bilaterally. ABI 0.96 on the right 0.88 on left;;;12/27/2011   -ABI  right .87 and left ABI .78  ,LEFT CIA and EIA stent normall patency, left CFA,SFA,and popliteal 0-49%; rgt proximal SFA 50-69%,rgt CIA,EIA, and CFA 0-49%  . Chronic low back pain     scoliosis & lordosis  . H/O: pneumonia   . Hypothyroidism   . Anxiety   . Cervical neck pain with evidence of disc disease     with need for surgery -- November 2015  . Osteoarthritis of back     And neck, hands,spine  . H/O ischemic left MCA stroke 11/18/2014    MRI/MRA of head: Multifocal left MCA infarct.. Also possible left ACA territory infarct -- complete occlusion of left MCA distally at M2 level. Marked left ACA attenuation.    Past Surgical History  Procedure Laterality Date  . Iliac artery stent Left 04/06/2005    Ex Iliac - CFA (Smart STENTS -- 7x4 in EIA,  6 x 3 CFA)   . Coronary artery bypass graft  2009    LIMA-LAD, SVG-RCA (also MAZE & AV fistula ligation)  . Maze  2009    along with CABG  . Cardioversion  04/05/2012    Procedure: CARDIOVERSION;  Surgeon: Sanda Klein, MD;  Location: [facility] ENDOSCOPY;  Service: Cardiovascular;  Laterality: N/A;  . Transthoracic echocardiogram  Jan 2012    EF ~40-45%, global HK; PAP ~45-50 mmHg  . Nm myoview ltd  May 2013    No ischemia or Infarct  . Abdominal hysterectomy    . Cardiac catheterization  07/30/2007    75% LAD ,3 stenoses of 75-90% in  RCA;circumflex from proximal RCA with no lesion seen,normal ramus intermediate branh; normal LV systoilc function  . Nm cardiolite ltd  12/2013    Non-gated for Afib; No Ischemia or Infarction.  Marland Kitchen Anterior cervical decomp/discectomy fusion N/A 01/23/2014    Procedure: ANTERIOR CERVICAL DECOMPRESSION/DISCECTOMY FUSION CERVICAL FOUR-FIVE,CERVICAL FIVE-SIX,CERVICAL SIX-SEVEN;  Surgeon: Elaina Hoops, MD;  Location: [place] NEURO ORS;  Service: Neurosurgery;  Laterality: N/A;  . Colonoscopy with propofol N/A 11/12/2014    Procedure: COLONOSCOPY WITH PROPOFOL;  Surgeon: Juanita Craver, MD;  Location: [facility] ENDOSCOPY;  Service:  Endoscopy;  Laterality: N/A;    Family History  Problem Relation Age of Onset  . Heart attack Mother   . Stroke Father   . Heart disease Brother 61   Social History:  reports that she quit smoking about 17 years ago. Her smoking use included Cigarettes. She smoked 0.50 packs per day. She has never used smokeless tobacco. She reports that she does not drink alcohol or use illicit drugs.  Allergies: No Known Allergies  Medications:                                                                                                                           I have reviewed the patient's current medications.  ROS:                                                                                                                                       History obtained from daugther, patient, and chart review  General ROS: negative for - chills, fatigue, fever, night sweats, or weight gain Psychological ROS: negative for - behavioral disorder, hallucinations, memory difficulties, mood swings or suicidal ideation Ophthalmic ROS: negative for - blurry  vision, double vision, eye pain or loss of vision ENT ROS: negative for - epistaxis, nasal discharge, oral lesions, sore throat, tinnitus or vertigo Allergy and Immunology ROS: negative for - hives or itchy/watery eyes Hematological and Lymphatic ROS: negative for - bleeding problems, bruising or swollen lymph nodes Endocrine ROS: negative for - galactorrhea, hair pattern changes, polydipsia/polyuria or temperature intolerance Respiratory ROS: negative for - cough, hemoptysis, shortness of breath or wheezing Cardiovascular ROS: negative for - chest pain, dyspnea on exertion, edema or irregular heartbeat Gastrointestinal ROS: negative for - abdominal pain, diarrhea, hematemesis, nausea/vomiting or stool incontinence Genito-Urinary ROS: negative for - dysuria, hematuria, incontinence or urinary frequency/urgency Musculoskeletal ROS: negative for - joint  swelling or muscular weakness Neurological ROS: as noted in HPI Dermatological ROS: negative for rash and skin lesion changes  Physical exam:  Constitutional: well developed, pleasant female in no apparent distress. Blood pressure 131/96, pulse 73, temperature 97.6 F (36.4 C), temperature source Oral, resp. rate 17, height 5' (1.524 m), weight 39.009 kg (86 lb), SpO2 99 %. Eyes: no jaundice or exophthalmos.  Head: normocephalic. Neck: supple, no bruits, no JVD. Cardiac: no murmurs. Lungs: clear. Abdomen: soft, no tender, no mass. Extremities: no edema, clubbing, or cyanosis.  Skin: no rash  Neurologic Examination:                                                                                                      General: NAD Mental Status: Alert, oriented, thought content appropriate.  Speech fluent without evidence of aphasia.  Able to follow 3 step commands without difficulty. Cranial Nerves: II: Discs flat bilaterally; Visual fields grossly normal, pupils equal, round, reactive to light and accommodation III,IV, VI: ptosis not present, extra-ocular motions intact bilaterally V,VII: smile symmetric, facial light touch sensation normal bilaterally VIII: hearing normal bilaterally IX,X: uvula rises symmetrically XI: bilateral shoulder shrug XII: midline tongue extension without atrophy or fasciculations  Motor: Right : Upper extremity   5/5    Left:     Upper extremity   5/5  Lower extremity   5/5     Lower extremity   5/5 Tone and bulk:normal tone throughout; no atrophy noted Sensory: Pinprick and light touch intact throughout, bilaterally Deep Tendon Reflexes:  Right: Upper Extremity   Left: Upper extremity   biceps (C-5 to C-6) 2/4   biceps (C-5 to C-6) 2/4 tricep (C7) 2/4    triceps (C7) 2/4 Brachioradialis (C6) 2/4  Brachioradialis (C6) 2/4  Lower Extremity Lower Extremity  quadriceps (L-2 to L-4) 2/4   quadriceps (L-2 to L-4) 2/4 Achilles (S1) 2/4   Achilles (S1)  2/4  Plantars: Right: downgoing   Left: downgoing Cerebellar: normal finger-to-nose,  normal heel-to-shin test Gait:  No tested due to multiple leads     Results for orders placed or performed during the hospital encounter of 04/05/15 (from the past 48 hour(s))  CBG monitoring, ED     Status: None   Collection Time: 04/05/15  4:47 PM  Result Value Ref Range   Glucose-Capillary 77 65 - 99 mg/dL  Protime-INR  Status: Abnormal   Collection Time: 04/05/15  4:52 PM  Result Value Ref Range   Prothrombin Time 20.3 (H) 11.6 - 15.2 seconds   INR 1.74 (H) 0.00 - 1.49  APTT     Status: Abnormal   Collection Time: 04/05/15  4:52 PM  Result Value Ref Range   aPTT 45 (H) 24 - 37 seconds    Comment:        IF BASELINE aPTT IS ELEVATED, SUGGEST PATIENT RISK ASSESSMENT BE USED TO DETERMINE APPROPRIATE ANTICOAGULANT THERAPY.   CBC     Status: Abnormal   Collection Time: 04/05/15  4:52 PM  Result Value Ref Range   WBC 6.7 4.0 - 10.5 K/uL   RBC 5.33 (H) 3.87 - 5.11 MIL/uL   Hemoglobin 10.4 (L) 12.0 - 15.0 g/dL   HCT 36.4 36.0 - 46.0 %   MCV 68.3 (L) 78.0 - 100.0 fL   MCH 19.5 (L) 26.0 - 34.0 pg   MCHC 28.6 (L) 30.0 - 36.0 g/dL   RDW 17.1 (H) 11.5 - 15.5 %   Platelets 202 150 - 400 K/uL  Differential     Status: None   Collection Time: 04/05/15  4:52 PM  Result Value Ref Range   Neutrophils Relative % 70 %   Lymphocytes Relative 19 %   Monocytes Relative 8 %   Eosinophils Relative 3 %   Basophils Relative 0 %   Neutro Abs 4.7 1.7 - 7.7 K/uL   Lymphs Abs 1.3 0.7 - 4.0 K/uL   Monocytes Absolute 0.5 0.1 - 1.0 K/uL   Eosinophils Absolute 0.2 0.0 - 0.7 K/uL   Basophils Absolute 0.0 0.0 - 0.1 K/uL   RBC Morphology POLYCHROMASIA PRESENT     Comment: TARGET CELLS ELLIPTOCYTES   Comprehensive metabolic panel     Status: Abnormal   Collection Time: 04/05/15  4:52 PM  Result Value Ref Range   Sodium 141 135 - 145 mmol/L   Potassium 4.0 3.5 - 5.1 mmol/L   Chloride 104 101 - 111  mmol/L   CO2 27 22 - 32 mmol/L   Glucose, Bld 86 65 - 99 mg/dL   BUN 14 6 - 20 mg/dL   Creatinine, Ser 0.84 0.44 - 1.00 mg/dL   Calcium 9.2 8.9 - 10.3 mg/dL   Total Protein 6.5 6.5 - 8.1 g/dL   Albumin 3.7 3.5 - 5.0 g/dL   AST 20 15 - 41 U/L   ALT 10 (L) 14 - 54 U/L   Alkaline Phosphatase 67 38 - 126 U/L   Total Bilirubin 0.8 0.3 - 1.2 mg/dL   GFR calc non Af Amer >60 >60 mL/min   GFR calc Af Amer >60 >60 mL/min    Comment: (NOTE) The eGFR has been calculated using the CKD EPI equation. This calculation has not been validated in all clinical situations. eGFR's persistently <60 mL/min signify possible Chronic Kidney Disease.    Anion gap 10 5 - 15  I-stat troponin, ED (not at [facility], [facility])     Status: None   Collection Time: 04/05/15  5:00 PM  Result Value Ref Range   Troponin i, poc 0.00 0.00 - 0.08 ng/mL   Comment 3            Comment: Due to the release kinetics of cTnI, a negative result within the first hours of the onset of symptoms does not rule out myocardial infarction with certainty. If myocardial infarction is still suspected, repeat the test at appropriate intervals.  I-Stat Chem 8, ED  (not at [facility], [facility])     Status: None   Collection Time: 04/05/15  5:02 PM  Result Value Ref Range   Sodium 140 135 - 145 mmol/L   Potassium 3.8 3.5 - 5.1 mmol/L   Chloride 101 101 - 111 mmol/L   BUN 15 6 - 20 mg/dL   Creatinine, Ser 0.90 0.44 - 1.00 mg/dL   Glucose, Bld 81 65 - 99 mg/dL   Calcium, Ion 1.16 1.13 - 1.30 mmol/L   TCO2 27 0 - 100 mmol/L   Hemoglobin 13.3 12.0 - 15.0 g/dL   HCT 39.0 36.0 - 46.0 %   Ct Head Wo Contrast  04/05/2015  CLINICAL DATA:  SLURRED SPEECH THIS AM PER FAMILY. PATIENT HAS NOT NOTICED SYMPTOMS. HX OF STROKE S/P CABG x 2. EXAM: CT HEAD WITHOUT CONTRAST TECHNIQUE: Contiguous axial images were obtained from the base of the skull through the vertex without intravenous contrast. COMPARISON:  11/18/2014 CT and MRI FINDINGS: Low-attenuation identified  within the left frontal lobe, consistent with previous middle cerebral infarct, which has shown evolution since prior CT exam. There is periventricular white matter change. Small lacunar infarct within the right basal ganglia is stable. No evidence for acute infarction or intra cerebral hemorrhage. No intra or extra-axial fluid collection or mass. Bone windows demonstrate significant atherosclerotic calcification of the internal carotid arteries. There is mild mucoperiosteal thickening of the ethmoid air cells. No acute calvarial abnormality identified. IMPRESSION: 1. Changes consistent with old left middle cerebral infarct. 2.  No evidence for acute intracranial abnormality. 3. Atrophy and small vessel disease. 4. Mild chronic sinusitis. Electronically Signed   By: Nolon Nations M.D.   On: 04/05/2015 19:15    Assessment: 75 y.o. female with multiple risk factors for stroke, comes in after sustaining a transient episode of dysarthria and dysequilibrium that allegedly lasted a few hours. Neuro-exam is non focal . CT head without acute abnormality. Possible TIA, subcortical. Patient on apixaban, had comprehensive stroke work up 10/16. Patient already admitted to medicine. Recommend MRI brain but no further work up as it was done 2 months ago. Stroke team will follow up tomorrow.     Stroke Risk Factors - age, HTN, hyperlipidemia, CAD s.p CABG, ischemic cardiomyopathy, PAF, stroke.  Dorian Pod, MD [facility] [phone]  04/05/2015, 9:04 PM

## 2015-04-06 ENCOUNTER — Observation Stay (HOSPITAL_COMMUNITY): Payer: Medicare HMO

## 2015-04-06 ENCOUNTER — Observation Stay (HOSPITAL_BASED_OUTPATIENT_CLINIC_OR_DEPARTMENT_OTHER): Payer: Medicare HMO

## 2015-04-06 DIAGNOSIS — N3000 Acute cystitis without hematuria: Secondary | ICD-10-CM | POA: Insufficient documentation

## 2015-04-06 DIAGNOSIS — R05 Cough: Secondary | ICD-10-CM | POA: Diagnosis not present

## 2015-04-06 DIAGNOSIS — Z8673 Personal history of transient ischemic attack (TIA), and cerebral infarction without residual deficits: Secondary | ICD-10-CM | POA: Insufficient documentation

## 2015-04-06 DIAGNOSIS — I492 Junctional premature depolarization: Secondary | ICD-10-CM

## 2015-04-06 DIAGNOSIS — E785 Hyperlipidemia, unspecified: Secondary | ICD-10-CM | POA: Diagnosis not present

## 2015-04-06 DIAGNOSIS — I255 Ischemic cardiomyopathy: Secondary | ICD-10-CM | POA: Diagnosis not present

## 2015-04-06 DIAGNOSIS — G9340 Encephalopathy, unspecified: Secondary | ICD-10-CM | POA: Diagnosis not present

## 2015-04-06 DIAGNOSIS — J690 Pneumonitis due to inhalation of food and vomit: Secondary | ICD-10-CM | POA: Diagnosis not present

## 2015-04-06 DIAGNOSIS — I2510 Atherosclerotic heart disease of native coronary artery without angina pectoris: Secondary | ICD-10-CM

## 2015-04-06 DIAGNOSIS — I739 Peripheral vascular disease, unspecified: Secondary | ICD-10-CM

## 2015-04-06 DIAGNOSIS — G459 Transient cerebral ischemic attack, unspecified: Secondary | ICD-10-CM | POA: Diagnosis not present

## 2015-04-06 DIAGNOSIS — G458 Other transient cerebral ischemic attacks and related syndromes: Secondary | ICD-10-CM | POA: Diagnosis not present

## 2015-04-06 DIAGNOSIS — I482 Chronic atrial fibrillation: Secondary | ICD-10-CM | POA: Diagnosis not present

## 2015-04-06 DIAGNOSIS — I6789 Other cerebrovascular disease: Secondary | ICD-10-CM

## 2015-04-06 DIAGNOSIS — R059 Cough, unspecified: Secondary | ICD-10-CM | POA: Insufficient documentation

## 2015-04-06 LAB — DIGOXIN LEVEL: Digoxin Level: 0.6 ng/mL — ABNORMAL LOW (ref 0.8–2.0)

## 2015-04-06 LAB — URINALYSIS W MICROSCOPIC (NOT AT ARMC)
Bilirubin Urine: NEGATIVE
GLUCOSE, UA: NEGATIVE mg/dL
Hgb urine dipstick: NEGATIVE
KETONES UR: NEGATIVE mg/dL
Nitrite: NEGATIVE
PH: 6.5 (ref 5.0–8.0)
Protein, ur: NEGATIVE mg/dL
RBC / HPF: NONE SEEN RBC/hpf (ref 0–5)
Specific Gravity, Urine: 1.019 (ref 1.005–1.030)

## 2015-04-06 LAB — LIPID PANEL
CHOL/HDL RATIO: 2 ratio
CHOLESTEROL: 123 mg/dL (ref 0–200)
HDL: 63 mg/dL (ref >40)
LDL CALC: 47 mg/dL (ref 0–99)
TRIGLYCERIDES: 64 mg/dL (ref <150)
VLDL: 13 mg/dL (ref 0–40)

## 2015-04-06 LAB — CBC
HEMATOCRIT: 36.3 % (ref 36.0–46.0)
HEMOGLOBIN: 9.9 g/dL — AB (ref 12.0–15.0)
MCH: 18.9 pg — ABNORMAL LOW (ref 26.0–34.0)
MCHC: 27.3 g/dL — AB (ref 30.0–36.0)
MCV: 69.1 fL — ABNORMAL LOW (ref 78.0–100.0)
Platelets: 184 10*3/uL (ref 150–400)
RBC: 5.25 MIL/uL — AB (ref 3.87–5.11)
RDW: 17.2 % — ABNORMAL HIGH (ref 11.5–15.5)
WBC: 7.7 10*3/uL (ref 4.0–10.5)

## 2015-04-06 LAB — BASIC METABOLIC PANEL
ANION GAP: 10 (ref 5–15)
BUN: 12 mg/dL (ref 6–20)
CHLORIDE: 105 mmol/L (ref 101–111)
CO2: 26 mmol/L (ref 22–32)
CREATININE: 0.78 mg/dL (ref 0.44–1.00)
Calcium: 8.7 mg/dL — ABNORMAL LOW (ref 8.9–10.3)
GFR calc non Af Amer: >60 mL/min (ref >60)
Glucose, Bld: 105 mg/dL — ABNORMAL HIGH (ref 65–99)
POTASSIUM: 4 mmol/L (ref 3.5–5.1)
Sodium: 141 mmol/L (ref 135–145)

## 2015-04-06 LAB — TSH: TSH: 1.529 u[IU]/mL (ref 0.350–4.500)

## 2015-04-06 LAB — TROPONIN I
Troponin I: <0.03 ng/mL (ref <0.031)
Troponin I: <0.03 ng/mL (ref <0.031)

## 2015-04-06 MED ORDER — DEXTROSE 5 % IV SOLN
500.0000 mg | INTRAVENOUS | Status: DC
  Administered 2015-04-06 – 2015-04-07 (×2): 500 mg via INTRAVENOUS
  Filled 2015-04-06 (×3): qty 500

## 2015-04-06 MED ORDER — METOPROLOL TARTRATE 25 MG PO TABS
37.5000 mg | ORAL_TABLET | Freq: Two times a day (BID) | ORAL | Status: DC
  Administered 2015-04-07: 37.5 mg via ORAL
  Filled 2015-04-06 (×2): qty 1

## 2015-04-06 MED ORDER — DEXTROSE 5 % IV SOLN
1.0000 g | INTRAVENOUS | Status: DC
  Administered 2015-04-06 – 2015-04-08 (×3): 1 g via INTRAVENOUS
  Filled 2015-04-06 (×4): qty 10

## 2015-04-06 MED ORDER — DIGOXIN 125 MCG PO TABS
0.2500 mg | ORAL_TABLET | Freq: Every day | ORAL | Status: DC
  Administered 2015-04-07 – 2015-04-08 (×2): 0.25 mg via ORAL
  Filled 2015-04-06 (×3): qty 2

## 2015-04-06 NOTE — Progress Notes (Signed)
Held 6:15 dose of metoprolol 37.5 mg because of systolic BP being in 90's. Notified oncoming nurse.

## 2015-04-06 NOTE — Progress Notes (Signed)
Pt ambulates with out assistive device to the bathroom. Stand by assist, gait steady. No noted distress.

## 2015-04-06 NOTE — Evaluation (Signed)
Occupational Therapy Evaluation Patient Details Name: Brenda RinksRobina B Kranz MRN: [id number] DOB: 01/30/1941 Today's Date: 04/06/2015    History of Present Illness 75 y.o. 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 In addition patient was noticed to have some dysarthria. CT of the head did not show anything acute and on exam patient appears nonfocal.  Patient has been having left upper back pain which patient states has been chronic. Patient's heart rate is mildly elevated and is in A. fib. Rate fluctuating 86 to 150 at rest.   Clinical Impression   Pt admitted with above.  Pt's daughter present and reports pt is practically back at baseline today.  Ambulated to bathroom with supervision, pt completing toileting tasks at Mod I level.  Pt most concerned with back pain/spasms as they seem to "come out of nowhere".  Pt with onset of back pain during session and quickly returned to bed.  Per pt and pt's daughter report, pt is at baseline and they are able to provide intermittent supervision.  Pt will not require acute OT services and no follow up OT recommended at this time.    Follow Up Recommendations  No OT follow up    Equipment Recommendations  None recommended by OT    Recommendations for Other Services       Precautions / Restrictions Restrictions Weight Bearing Restrictions: No      Mobility Bed Mobility Overal bed mobility: Modified Independent                Transfers Overall transfer level: Needs assistance   Transfers: Sit to/from Stand;Stand Pivot Transfers Sit to Stand: Supervision Stand pivot transfers: Supervision       General transfer comment: Pt somewhat impulsive with mobility with onset of back pain/spasms, wanting to quickly return to bed         ADL Overall ADL's : At baseline                         Toilet  Transfer: Supervision/safety;Ambulation;Regular Social workerToilet   Toileting- Clothing Manipulation and Hygiene: Modified independent         General ADL Comments: Daughter assisted pt with setup of supplies for [employer]-care tasks with pt completing all bathing and grooming without assist.     Vision Vision Assessment?: No apparent visual deficits          Pertinent Vitals/Pain Pain Assessment: No/denies pain     Hand Dominance Right   Extremity/Trunk Assessment Upper Extremity Assessment Upper Extremity Assessment: Overall WFL for tasks assessed   Lower Extremity Assessment Lower Extremity Assessment: Defer to PT evaluation       Communication Communication Communication: No difficulties   Cognition Arousal/Alertness: Awake/alert Behavior During Therapy: WFL for tasks assessed/performed Overall Cognitive Status: Within Functional Limits for tasks assessed                                Home Living Family/patient expects to be discharged to:: Private residence Living Arrangements: Children Available Help at Discharge: Family;Available 24 hours/day Type of Home: House Home Access: Level entry     Home Layout: Two level;Able to live on main level with bedroom/bathroom (bathroom with shower is upstairs)     Bathroom Shower/Tub: Chief Strategy OfficerTub/shower unit   Bathroom Toilet: Standard     Home Equipment: Tub bench  Prior Functioning/Environment Level of Independence: Needs assistance    ADL's / Homemaking Assistance Needed: supervision/setup with bathing at shower level (upstairs)   Comments: hand held assist or support on shopping cart when out in community    OT Diagnosis: Generalized weakness   OT Problem List: Decreased activity tolerance;Pain   OT Treatment/Interventions:      OT Goals(Current goals can be found in the care plan section) Acute Rehab OT Goals Patient Stated Goal: to get rid of her back pain OT Goal Formulation: All assessment and  education complete, DC therapy  OT Frequency:      End of Session Nurse Communication: Mobility status  Activity Tolerance: Patient limited by pain Patient left: in bed;with call bell/phone within reach;with family/visitor present   Time: 1112-1129 OT Time Calculation (min): 17 min Charges:  OT General Charges $OT Visit: 1 Procedure OT Evaluation $OT Eval Low Complexity: 1 Procedure G-Codes: OT G-codes **NOT FOR INPATIENT CLASS** Functional Assessment Tool Used: clinical judgement Functional Limitation: [place] care [place] Care Current Status (Z6109): At least 1 percent but less than 20 percent impaired, limited or restricted [place] Care Goal Status (U0454): At least 1 percent but less than 20 percent impaired, limited or restricted [place] Care Discharge Status [phone]): At least 1 percent but less than 20 percent impaired, limited or restricted  Rosalio Loud, [phone] 04/06/2015, 11:40 AM

## 2015-04-06 NOTE — Progress Notes (Signed)
Patient admitted to room from ED. Patient alert and oriented x4. Daughter is at bedside. Patient oriented to room and call bell information. Admission completed. Offered patient opportunity to voice concerns. Will continue to monitor at this time.

## 2015-04-06 NOTE — Progress Notes (Signed)
  Echocardiogram 2D Echocardiogram has been performed.  Brenda SavoyCasey N Sagrario Glass 04/06/2015, 9:46 AM

## 2015-04-06 NOTE — Progress Notes (Signed)
STROKE TEAM PROGRESS NOTE   HISTORY [name] is an 75 y.o. 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 Patient daughter is at the bedside and said that for few hours this morning patient was " too sleepy, no speaking well, and off balance". Further, she was complaining of HA which is unusual for her. Denies associated vertigo, double vision, difficulty swallowing, focal weakness or numbness, confusion, or vision impairment. Taking apixaban religiously. CT head was personally reviewed and showed no acute abnormality.  Date last known well: 04/04/15 Time last known well: 8 pm tPA Given: no, symptoms resolved   SUBJECTIVE (INTERVAL HISTORY) The patient's daughter is at the bedside. The patient has known atrial fibrillation and cardiology has been working on rate control. The patient had been placed on metoprolol and digoxin, and her metoprolol dose has been doubled due to RVR. I stressed the importance of avoiding hypotension to to the patient's cerebrovascular disease.   OBJECTIVE Temp:  [97.5 F (36.4 C)-98.1 F (36.7 C)] 98 F (36.7 C) (01/03 1314) Pulse Rate:  [67-137] 136 (01/03 1314) Cardiac Rhythm:  [-] Atrial fibrillation (01/03 0800) Resp:  [16-20] 20 (01/03 1314) BP: (94-136)/(61-96) 95/67 mmHg (01/03 1314) SpO2:  [96 %-100 %] 98 % (01/03 1314) Weight:  [38.7 kg (85 lb 5.1 oz)-39.009 kg (86 lb)] 38.7 kg (85 lb 5.1 oz) (01/02 2122)  CBC:   Recent Labs Lab 04/05/15 1652 04/05/15 1702 04/06/15 0636  WBC 6.7  --  7.7  NEUTROABS 4.7  --   --   HGB 10.4* 13.3 9.9*  HCT 36.4 39.0 36.3  MCV 68.3*  --  69.1*  PLT 202  --  184    Basic Metabolic Panel:   Recent Labs Lab 04/05/15 1652 04/05/15 1702 04/06/15 0636  NA  141 140 141  K 4.0 3.8 4.0  CL 104 101 105  CO2 27  --  26  GLUCOSE 86 81 105*  BUN 14 15 12   CREATININE 0.84 0.90 0.78  CALCIUM 9.2  --  8.7*    Lipid Panel:     Component Value Date/Time   CHOL 123 04/06/2015 0636   TRIG 64 04/06/2015 0636   HDL 63 04/06/2015 0636   CHOLHDL 2.0 04/06/2015 0636   VLDL 13 04/06/2015 0636   LDLCALC 47 04/06/2015 0636   HgbA1c:  Lab Results  Component Value Date   HGBA1C 5.5 11/17/2014   Urine Drug Screen: No results found for: LABOPIA, COCAINSCRNUR, LABBENZ, AMPHETMU, THCU, LABBARB    IMAGING I have personally reviewed the radiological images below and agree with the radiology interpretations.  Ct Head Wo Contrast 04/05/2015   1. Changes consistent with old left middle cerebral infarct.  2. No evidence for acute intracranial abnormality.  3. Atrophy and small vessel disease.  4. Mild chronic sinusitis.   [name] Contrast 04/06/2015   1. No acute intracranial infarct or other process identified.  2. Late subacute left MCA territory infarct.  3. Additional chronic infarct within the posterior right temporoccipital region.  4. Mild chronic microvascular ischemic disease.   CXR - 1. Interstitial and faint airspace opacity in the right upper lobe suspicious for incipient pneumonia or aspiration pneumonitis. 2. Atherosclerosis and cardiomegaly. 3. Bony demineralization.  2D echo - - Left ventricle: The cavity  size was normal. Systolic function was mildly to moderately reduced. The estimated ejection fraction was 50%. Wall motion was normal; there were no regional wall motion abnormalities. The study was not technically sufficient to allow evaluation of LV diastolic dysfunction due to atrial fibrillation. - Ventricular septum: Septal motion showed paradox. - Aortic valve: There was no regurgitation. - Aortic root: The aortic root was normal in size. - Mitral valve: Calcified annulus. Mildly thickened leaflets  . Transvalvular velocity was within the normal range. There was no evidence for stenosis. - Left atrium: The atrium was moderately dilated. - Right ventricle: The cavity size was moderately dilated. Wall thickness was normal. Systolic function was moderately reduced. - Right atrium: The atrium was severely dilated. - Tricuspid valve: There was moderate regurgitation. - Pulmonic valve: Structurally normal valve. There was mild regurgitation. - Pulmonary arteries: Systolic pressure was severely increased. PA peak pressure: 59 mm Hg (S). - Inferior vena cava: The vessel was dilated. The respirophasic diameter changes were blunted (< 50%), consistent with elevated central venous pressure. - Pericardium, extracardiac: There was no pericardial effusion.  PHYSICAL EXAM  Temp:  [97.5 F (36.4 C)-98.1 F (36.7 C)] 98 F (36.7 C) (01/03 1706) Pulse Rate:  [73-137] 110 (01/03 1706) Resp:  [16-20] 18 (01/03 1706) BP: (94-136)/(61-96) 105/71 mmHg (01/03 1706) SpO2:  [96 %-100 %] 98 % (01/03 1706) Weight:  [85 lb 5.1 oz (38.7 kg)] 85 lb 5.1 oz (38.7 kg) (01/02 2122)  General - Well nourished, well developed, in no apparent distress.  Ophthalmologic - Fundi not visualized due to noncooperation.  Cardiovascular - irregularly irregular heart rate and rhythm.  Mental Status -  Level of arousal and orientation to place, and person were intact, but not orientated to time. Language including expression, naming, repetition, comprehension was assessed and found intact. Fund of Knowledge was assessed and was intact.  Cranial Nerves II - XII - II - Visual field intact OU. III, IV, VI - Extraocular movements intact. V - Facial sensation intact bilaterally. VII - mild right facial droop VIII - Hearing & vestibular intact bilaterally. X - Palate elevates symmetrically. XI - Chin turning & shoulder shrug intact bilaterally. XII - Tongue protrusion intact.  Motor Strength - The  patient's strength was normal in all extremities and pronator drift was absent.  Bulk was normal and fasciculations were absent.   Motor Tone - Muscle tone was assessed at the neck and appendages and was normal.  Reflexes - The patient's reflexes were 1+ in all extremities and she had no pathological reflexes.  Sensory - Light touch, temperature/pinprick were assessed and were symmetrical.    Coordination - The patient had normal movements in the hands and feet with no ataxia or dysmetria.  Tremor was absent.  Gait and Station - not tested due to safety concerns.   ASSESSMENT/PLAN [name] is a 75 y.o. female with history of atrial fibrillation s/p MAZE on Eliquis, CAD s/p CABG x 2, ischemic cardiomyopathy, HTN, HLD, COPD, PVD, and previous stroke in 11/2014 presenting with unsteadiness, dysarthria, and drowsiness. She did not receive IV t-PA due to a solution of deficits   Encephalopathy with recrudescence of old stroke  UA showed UTI  CXR concerning for aspiration   Presented with imbalance, AMS, speech difficulty, HA  MRI no acute stroke  On rocephin and azithromycin  Hx of stroke - left MCA territory infarct in 11/2014 due to atrial fibrillation.  Resultant - mild right facial droop  MRI - No  acute infarct identified.  Carotid Doppler - not ordered (unremarkable in 11/2014)  2D Echo - EF 50%. No cardiac source of emboli other than known atrial fibrillation.  LDL - 47  HgbA1c pending  VTE prophylaxis - Eliquis Diet Heart Room service appropriate?: Yes; Fluid consistency:: Thin  aspirin 81 mg daily and Eliquis (apixaban) daily prior to admission, now on aspirin 81 mg daily and Eliquis (apixaban) daily.  Patient counseled to be compliant with her antithrombotic medications  Ongoing aggressive stroke risk factor management  Therapy recommendations: No follow-up physical therapy  Disposition: Pending  Left MCA occlusion  Due to acute stroke in 11/2014  BP  goal 130-150  Avoid hypotension  Recommend to be precautions with BP meds.   afib with RVR  Following with Dr. Herbie BaltimoreHarding  On metoprolol and digoxin  Recently increased metoprolol dose  Recommend avoid hypotension due to left MCA infarct  May consider adding cardizam   Digoxin level was low   Hyperlipidemia  Home meds: Crestor 5 mg daily  resumed in hospital  LDL 47,  goal < 70  Continue statin at discharge  Other Stroke Risk Factors  Advanced age  Cigarette smoker, quit smoking 17 years ago.  Family hx stroke (father)  Coronary artery disease s/p CABG x 2 - on ASA   Other Active Problems  Anemia  Hospital day # 1  Marvel PlanJindong Saqib Cazarez, MD PhD Stroke Neurology 04/06/2015 6:45 PM    To contact Stroke Continuity provider, please refer to [email or web address]. After hours, contact General Neurology

## 2015-04-06 NOTE — Progress Notes (Addendum)
[facility] PROGRESS NOTE  [name]:[id number] DOB: 17-Feb-1941 DOA: 04/05/2015 PCP: [name], MD  Length of stay: 1   Assessment/Plan: Principal Problem:   TIA (transient ischemic attack) Active Problems:   2 Vessel CAD - s/p CABG; LIMA-LAD, SVG-RCA   Chronic atrial fibrillation (HCC)   Hyperlipidemia with target LDL less than 70   Hypothyroidism   Ischemic cardiomyopathy   PAD (peripheral artery disease) (HCC)   S/P CABG x 2   Cough   History of present illness [name] is an 75 y.o. female with a past medical history significant for HTN, hyperlipidemia, CAD s.p CABG, ischemic cardiomyopathy, PAF on apixaban (previously treated with xarelto and pradaxa), left MCA embolic infarct 81/19, and PAD, brought in by EMS due to acute onset of the aforementioned symptoms. Patient daughter is at the bedside and said that for few hours this morning patient was " too sleepy, no speaking well, and off balance". Further, she was complaining of HA which is unusual for her. Denies associated vertigo, double vision, difficulty swallowing, focal weakness or numbness, confusion, or vision impairment. Taking apixaban religiously. CT head was personally reviewed and showed no acute abnormality.   Assessment and plan 1. TIA - appreciate neurology consult. At this time patient appears nonfocal. MRI brain shows subacute left MCA infarct, no acute infarct, further workup as per neurology. Patient is on Apixaban which will be continued. Further recommendations to be made by neurology 2. A. fib with RVR, followed by Dr. Herbie Baltimore, her amiodarone was stopped during her last hospital stay for adverse effects. Placed on digoxin and beta blocker.- Patient advised to take 50 mg of metoprolol in the evening and 25 mg in the morning. Digoxin level subtherapeutic, will increase digoxin to 0.25 mg a day?. Chads 2 vasc score is 7. Patient is on Apixaban. Consult cardiology for further  recommendations 3. CAD status post CABG - has some left upper back pain. Cardiac enzymes negative 4. Hypothyroidism - TSH within normal limits 5. Ischemic cardiomyopathy last EF was around 55% in August 2016 - appears compensated. 6. Hyperlipidemia statins. 7. UTI/CAP-started patient on Rocephin plus azithromycin, SLP evaluation for recent suspected subacute CVA  DVT prophylaxsis eliquis   Code Status:      Code Status Orders        Start     Ordered   04/05/15 2321  Full code   Continuous     04/05/15 2322      Family Communication: Discussed in detail with the patient, all imaging results, lab results explained to the patient   Disposition Plan:  As above      Consultants:  Neurology  Procedures:  None  Antibiotics: Anti-infectives    Start     Dose/Rate Route Frequency Ordered Stop   04/06/15 1400  cefTRIAXone (ROCEPHIN) 1 g in dextrose 5 % 50 mL IVPB     1 g 100 mL/hr over 30 Minutes Intravenous Every 24 hours 04/06/15 1223     04/06/15 1400  azithromycin (ZITHROMAX) 500 mg in dextrose 5 % 250 mL IVPB     500 mg 250 mL/hr over 60 Minutes Intravenous Every 24 hours 04/06/15 1223           HPI/Subjective: Patient presented with ataxic gait, cough, urinary urgency, slurred speech has improved  Objective: Filed Vitals:   04/06/15 0615 04/06/15 0656 04/06/15 0952 04/06/15 0959  BP: 94/63 94/72 119/87   Pulse:  114 109 110  Temp:  98 F (36.7 C)   TempSrc:   Oral   Resp:   18   Height:      Weight:      SpO2:  97% 98%    No intake or output data in the 24 hours ending 04/06/15 1231  Exam:  General: No acute respiratory distress Lungs: Clear to auscultation bilaterally without wheezes or crackles Cardiovascular: Regular rate and rhythm without murmur gallop or rub normal S1 and S2 Abdomen: Nontender, nondistended, soft, bowel sounds positive, no rebound, no ascites, no appreciable mass Extremities: No significant cyanosis, clubbing, or edema  bilateral lower extremities     Data Review   Micro Results No results found for this or any previous visit (from the past 240 hour(s)).  Radiology Reports Ct Head Wo Contrast  04/05/2015  CLINICAL DATA:  SLURRED SPEECH THIS AM PER FAMILY. PATIENT HAS NOT NOTICED SYMPTOMS. HX OF STROKE S/P CABG x 2. EXAM: CT HEAD WITHOUT CONTRAST TECHNIQUE: Contiguous axial images were obtained from the base of the skull through the vertex without intravenous contrast. COMPARISON:  11/18/2014 CT and MRI FINDINGS: Low-attenuation identified within the left frontal lobe, consistent with previous middle cerebral infarct, which has shown evolution since prior CT exam. There is periventricular white matter change. Small lacunar infarct within the right basal ganglia is stable. No evidence for acute infarction or intra cerebral hemorrhage. No intra or extra-axial fluid collection or mass. Bone windows demonstrate significant atherosclerotic calcification of the internal carotid arteries. There is mild mucoperiosteal thickening of the ethmoid air cells. No acute calvarial abnormality identified. IMPRESSION: 1. Changes consistent with old left middle cerebral infarct. 2.  No evidence for acute intracranial abnormality. 3. Atrophy and small vessel disease. 4. Mild chronic sinusitis. Electronically Signed   By: Norva Pavlov M.D.   On: 04/05/2015 19:15   [name] Contrast  04/06/2015  CLINICAL DATA:  Initial evaluation for transient slurred speech. EXAM: MRI HEAD WITHOUT CONTRAST TECHNIQUE: Multiplanar, multiecho pulse sequences of the brain and surrounding structures were obtained without intravenous contrast. COMPARISON:  Prior CT from 04/04/2014 as well as earlier studies. FINDINGS: Cerebral volume within normal limits for patient age. Mild patchy T2/FLAIR hyperintensity within the periventricular white matter, primarily within the periatrial white matter, most likely related to chronic small vessel ischemic disease,  mild for age. Encephalomalacia within the right temporal occipital region consistent with remote ischemic infarct. There is persistent diffusion abnormality within the left MCA territory in the left frontal lobe in the region of the operculum. No corresponding signal loss seen on ADC map, consistent with late subacute infarct. Corresponding T2/FLAIR signal within this region without mass effect. Small amount of laminar necrosis. No associated hemorrhage. No other acute infarct identified. Normal intravascular flow voids preserved. Gray-white matter differentiation otherwise maintained. No mass lesion, midline shift or mass effect. No hydrocephalus. No extra-axial fluid collection. Craniocervical junction within normal limits. ACDF partially visualized within the upper cervical spine. Pituitary gland normal. No acute abnormality about the orbits. Sequela prior bilateral lens extraction. Mild mucosal thickening within the left maxillary sinus. Paranasal sinuses otherwise largely clear. Trace opacity within the mastoid air cells. Inner ear structures grossly normal. Bone marrow signal intensity within normal limits. No scalp soft tissue abnormality. IMPRESSION: 1. No acute intracranial infarct or other process identified. 2. Late subacute left MCA territory infarct. 3. Additional chronic infarct within the posterior right temporoccipital region. 4. Mild chronic microvascular ischemic disease. Electronically Signed   By: Rise Mu M.D.   On:  04/06/2015 06:09   Dg Chest Port 1 View  04/06/2015  CLINICAL DATA:  Cough.  Stroke. EXAM: PORTABLE CHEST 1 VIEW COMPARISON:  11/18/2014 FINDINGS: Prior CABG.  Atherosclerotic aortic arch.  Mild cardiomegaly. Emphysema noted. Indistinct interstitial and faint confluent airspace opacity in the right upper lobe. Atelectasis or scarring along the lingula. Bony demineralization noted along with dextroconvex thoracic scoliosis. IMPRESSION: 1. Interstitial and faint airspace  opacity in the right upper lobe suspicious for incipient pneumonia or aspiration pneumonitis. 2. Atherosclerosis and cardiomegaly. 3. Bony demineralization. Electronically Signed   By: Gaylyn Rong M.D.   On: 04/06/2015 08:33     CBC  Recent Labs Lab 04/05/15 1652 04/05/15 1702 04/06/15 0636  WBC 6.7  --  7.7  HGB 10.4* 13.3 9.9*  HCT 36.4 39.0 36.3  PLT 202  --  184  MCV 68.3*  --  69.1*  MCH 19.5*  --  18.9*  MCHC 28.6*  --  27.3*  RDW 17.1*  --  17.2*  LYMPHSABS 1.3  --   --   MONOABS 0.5  --   --   EOSABS 0.2  --   --   BASOSABS 0.0  --   --     Chemistries   Recent Labs Lab 04/05/15 1652 04/05/15 1702 04/06/15 0636  NA 141 140 141  K 4.0 3.8 4.0  CL 104 101 105  CO2 27  --  26  GLUCOSE 86 81 105*  BUN 14 15 12   CREATININE 0.84 0.90 0.78  CALCIUM 9.2  --  8.7*  AST 20  --   --   ALT 10*  --   --   ALKPHOS 67  --   --   BILITOT 0.8  --   --    ------------------------------------------------------------------------------------------------------------------ estimated creatinine clearance is 37.1 mL/min (by C-G formula based on Cr of 0.78). ------------------------------------------------------------------------------------------------------------------ No results for input(s): HGBA1C in the last 72 hours. ------------------------------------------------------------------------------------------------------------------  Recent Labs  04/06/15 0636  CHOL 123  HDL 63  LDLCALC 47  TRIG 64  CHOLHDL 2.0   ------------------------------------------------------------------------------------------------------------------  Recent Labs  04/06/15 0240  TSH 1.529   ------------------------------------------------------------------------------------------------------------------ No results for input(s): VITAMINB12, FOLATE, FERRITIN, TIBC, IRON, RETICCTPCT in the last 72 hours.  Coagulation profile  Recent Labs Lab 04/05/15 1652  INR 1.74*    No  results for input(s): DDIMER in the last 72 hours.  Cardiac Enzymes  Recent Labs Lab 04/06/15 0240 04/06/15 0636 04/06/15 1100  TROPONINI <0.03 <0.03 <0.03   ------------------------------------------------------------------------------------------------------------------ Invalid input(s): POCBNP   CBG:  Recent Labs Lab 04/05/15 1647  GLUCAP 77       Studies: Ct Head Wo Contrast  04/05/2015  CLINICAL DATA:  SLURRED SPEECH THIS AM PER FAMILY. PATIENT HAS NOT NOTICED SYMPTOMS. HX OF STROKE S/P CABG x 2. EXAM: CT HEAD WITHOUT CONTRAST TECHNIQUE: Contiguous axial images were obtained from the base of the skull through the vertex without intravenous contrast. COMPARISON:  11/18/2014 CT and MRI FINDINGS: Low-attenuation identified within the left frontal lobe, consistent with previous middle cerebral infarct, which has shown evolution since prior CT exam. There is periventricular white matter change. Small lacunar infarct within the right basal ganglia is stable. No evidence for acute infarction or intra cerebral hemorrhage. No intra or extra-axial fluid collection or mass. Bone windows demonstrate significant atherosclerotic calcification of the internal carotid arteries. There is mild mucoperiosteal thickening of the ethmoid air cells. No acute calvarial abnormality identified. IMPRESSION: 1. Changes consistent with old left middle cerebral infarct.  2.  No evidence for acute intracranial abnormality. 3. Atrophy and small vessel disease. 4. Mild chronic sinusitis. Electronically Signed   By: Norva Pavlov M.D.   On: 04/05/2015 19:15   [name] Contrast  04/06/2015  CLINICAL DATA:  Initial evaluation for transient slurred speech. EXAM: MRI HEAD WITHOUT CONTRAST TECHNIQUE: Multiplanar, multiecho pulse sequences of the brain and surrounding structures were obtained without intravenous contrast. COMPARISON:  Prior CT from 04/04/2014 as well as earlier studies. FINDINGS: Cerebral volume  within normal limits for patient age. Mild patchy T2/FLAIR hyperintensity within the periventricular white matter, primarily within the periatrial white matter, most likely related to chronic small vessel ischemic disease, mild for age. Encephalomalacia within the right temporal occipital region consistent with remote ischemic infarct. There is persistent diffusion abnormality within the left MCA territory in the left frontal lobe in the region of the operculum. No corresponding signal loss seen on ADC map, consistent with late subacute infarct. Corresponding T2/FLAIR signal within this region without mass effect. Small amount of laminar necrosis. No associated hemorrhage. No other acute infarct identified. Normal intravascular flow voids preserved. Gray-white matter differentiation otherwise maintained. No mass lesion, midline shift or mass effect. No hydrocephalus. No extra-axial fluid collection. Craniocervical junction within normal limits. ACDF partially visualized within the upper cervical spine. Pituitary gland normal. No acute abnormality about the orbits. Sequela prior bilateral lens extraction. Mild mucosal thickening within the left maxillary sinus. Paranasal sinuses otherwise largely clear. Trace opacity within the mastoid air cells. Inner ear structures grossly normal. Bone marrow signal intensity within normal limits. No scalp soft tissue abnormality. IMPRESSION: 1. No acute intracranial infarct or other process identified. 2. Late subacute left MCA territory infarct. 3. Additional chronic infarct within the posterior right temporoccipital region. 4. Mild chronic microvascular ischemic disease. Electronically Signed   By: Rise Mu M.D.   On: 04/06/2015 06:09   Dg Chest Port 1 View  04/06/2015  CLINICAL DATA:  Cough.  Stroke. EXAM: PORTABLE CHEST 1 VIEW COMPARISON:  11/18/2014 FINDINGS: Prior CABG.  Atherosclerotic aortic arch.  Mild cardiomegaly. Emphysema noted. Indistinct interstitial  and faint confluent airspace opacity in the right upper lobe. Atelectasis or scarring along the lingula. Bony demineralization noted along with dextroconvex thoracic scoliosis. IMPRESSION: 1. Interstitial and faint airspace opacity in the right upper lobe suspicious for incipient pneumonia or aspiration pneumonitis. 2. Atherosclerosis and cardiomegaly. 3. Bony demineralization. Electronically Signed   By: Gaylyn Rong M.D.   On: 04/06/2015 08:33      Lab Results  Component Value Date   HGBA1C 5.5 11/17/2014   Lab Results  Component Value Date   LDLCALC 47 04/06/2015   CREATININE 0.78 04/06/2015       Scheduled Meds: .  stroke: mapping our early stages of recovery book   Does not apply Once  . apixaban  5 mg Oral BID  . aspirin EC  81 mg Oral Daily  . azithromycin  500 mg Intravenous Q24H  . cefTRIAXone (ROCEPHIN)  IV  1 g Intravenous Q24H  . digoxin  0.125 mg Oral Daily  . levothyroxine  75 mcg Oral QAC breakfast  . lubiprostone  24 mcg Oral BID  . metoprolol tartrate  37.5 mg Oral BID  . rosuvastatin  5 mg Oral QPM   Continuous Infusions:   Principal Problem:   TIA (transient ischemic attack) Active Problems:   2 Vessel CAD - s/p CABG; LIMA-LAD, SVG-RCA   Chronic atrial fibrillation (HCC)   Hyperlipidemia with target LDL less  than 70   Hypothyroidism   Ischemic cardiomyopathy   PAD (peripheral artery disease) (HCC)   S/P CABG x 2   Cough    Time spent: 45 minutes   Ocshner St[facility] Anne General HospitalBROL,Calab Sachse  [facility] Pager [phone]. If 7PM-7AM, please contact night-coverage at [email or web address], password [email or web address] 04/06/2015, 12:31 PM  LOS: 1 day

## 2015-04-06 NOTE — Evaluation (Signed)
Physical Therapy Evaluation Patient Details Name: [name] MRN: [id number] DOB: 11/30/1940 Today's Date: 04/06/2015   History of Present Illness  75 y.o. 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 In addition patient was noticed to have some dysarthria. CT of the head did not show anything acute and on exam patient appears nonfocal.  Patient has been having left upper back pain which patient states has been chronic. Patient's heart rate is mildly elevated and is in A. fib. Rate fluctuating 86 to 150 at rest.  Clinical Impression  Pt admitted with above diagnosis. Pt currently with functional limitations due to the deficits listed below (see PT Problem List). At the time of PT eval pt was able to perform transfers and ambulation with supervision for safety, however once back spasms began pt had difficulty maintaining upright posture and needed to lay down quickly. Overall, pt appears to be close to baseline of function, however will continue to see acutely to maximize function and independence prior to d/c. Pt will benefit from skilled PT to increase their independence and safety with mobility to allow discharge to the venue listed below.       Follow Up Recommendations No PT follow up    Equipment Recommendations  None recommended by PT    Recommendations for Other Services       Precautions / Restrictions Precautions Precautions: Fall;Back Precaution Comments: Back precautions for comfort. Imaging was negative for acute changes.  Restrictions Weight Bearing Restrictions: No      Mobility  Bed Mobility Overal bed mobility: Needs Assistance Bed Mobility: Rolling;Sidelying to Sit Rolling: Supervision Sidelying to sit: Supervision       General bed mobility comments: Pt was instructed in the log roll technique for comfort due to back pain. Pt required  VC's for sequencing.  Transfers Overall transfer level: Needs assistance Equipment used: None Transfers: Sit to/from Stand Sit to Stand: Supervision Stand pivot transfers: Supervision       General transfer comment: Pt was able to power-up to full standing without assistance. Close supervision for safety. Pt was cued for hand placement on seated surface for safety.   Ambulation/Gait Ambulation/Gait assistance: Supervision Ambulation Distance (Feet): 200 Feet Assistive device: None Gait Pattern/deviations: Step-through pattern;Decreased stride length Gait velocity: Decreased Gait velocity interpretation: Below normal speed for age/gender General Gait Details: Occasional unsteadiness noted but no assistance required to maintain balance. Pt holding her lower back during ambulation and states it is due to pain, however when spasms occured later in session it was not at that location.   Stairs Stairs: Yes Stairs assistance: Supervision Stair Management: One rail Left;Alternating pattern;Forwards Number of Stairs: 5 General stair comments: VC's for rail use to simulate home environment.   Wheelchair Mobility    Modified Rankin (Stroke Patients Only)       Balance Overall balance assessment: No apparent balance deficits (not formally assessed)                                           Pertinent Vitals/Pain Pain Assessment: Faces Faces Pain Scale: Hurts whole lot Pain Location: Upper back in between scapulae Pain Descriptors / Indicators: Spasm Pain Intervention(s): Heat applied;Repositioned;Patient requesting pain meds-RN notified    Home Living Family/patient expects to be discharged to:: Private residence Living Arrangements:  Children Available Help at Discharge: Family;Available 24 hours/day Type of Home: House Home Access: Level entry     Home Layout: Two level;Able to live on main level with bedroom/bathroom (bathroom with shower is  upstairs) Home Equipment: Tub bench      Prior Function Level of Independence: Needs assistance      ADL's / Homemaking Assistance Needed: supervision/setup with bathing at shower level (upstairs)  Comments: hand held assist or support on shopping cart when out in community     Hand Dominance   Dominant Hand: Right    Extremity/Trunk Assessment   Upper Extremity Assessment: Defer to OT evaluation           Lower Extremity Assessment: Overall WFL for tasks assessed      Cervical / Trunk Assessment: Kyphotic;Other exceptions  Communication   Communication: No difficulties  Cognition Arousal/Alertness: Awake/alert Behavior During Therapy: WFL for tasks assessed/performed Overall Cognitive Status: Within Functional Limits for tasks assessed                      General Comments      Exercises        Assessment/Plan    PT Assessment Patient needs continued PT services  PT Diagnosis Difficulty walking   PT Problem List Decreased strength;Decreased range of motion;Decreased activity tolerance;Decreased balance;Decreased mobility;Decreased knowledge of use of DME;Decreased safety awareness;Decreased knowledge of precautions;Pain  PT Treatment Interventions DME instruction;Gait training;Stair training;Functional mobility training;Therapeutic activities;Therapeutic exercise;Neuromuscular re-education;Patient/family education   PT Goals (Current goals can be found in the Care Plan section) Acute Rehab PT Goals Patient Stated Goal: to get rid of her back pain PT Goal Formulation: With patient/family Time For Goal Achievement: 04/13/15 Potential to Achieve Goals: Good    Frequency Min 3X/week   Barriers to discharge        Co-evaluation               End of Session Equipment Utilized During Treatment: Gait belt Activity Tolerance: Patient limited by pain Patient left: in bed;with call bell/phone within reach;with family/visitor present;Other  (comment) (heat pack applied to thoracic spine) Nurse Communication: Mobility status;Patient requests pain meds    Functional Assessment Tool Used: Clinical judgement Functional Limitation: Mobility: Walking and moving around Mobility: Walking and Moving Around Current Status [phone]): At least 1 percent but less than 20 percent impaired, limited or restricted Mobility: Walking and Moving Around Goal Status [phone]): At least 1 percent but less than 20 percent impaired, limited or restricted    Time: 1355-1426 PT Time Calculation (min) (ACUTE ONLY): 31 min   Charges:   PT Evaluation $PT Eval Low Complexity: 1 Procedure PT Treatments $Gait Training: 8-22 mins   PT G Codes:   PT G-Codes **NOT FOR INPATIENT CLASS** Functional Assessment Tool Used: Clinical judgement Functional Limitation: Mobility: Walking and moving around Mobility: Walking and Moving Around Current Status ([id number]): At least 1 percent but less than 20 percent impaired, limited or restricted Mobility: Walking and Moving Around Goal Status [phone]): At least 1 percent but less than 20 percent impaired, limited or restricted    Conni Slipper 04/06/2015, 3:14 PM   Conni Slipper, PT, DPT Acute Rehabilitation Services Pager: [phone]

## 2015-04-07 ENCOUNTER — Other Ambulatory Visit: Payer: Self-pay

## 2015-04-07 ENCOUNTER — Ambulatory Visit: Payer: Medicare HMO | Admitting: Neurology

## 2015-04-07 DIAGNOSIS — Z951 Presence of aortocoronary bypass graft: Secondary | ICD-10-CM

## 2015-04-07 DIAGNOSIS — E039 Hypothyroidism, unspecified: Secondary | ICD-10-CM | POA: Diagnosis not present

## 2015-04-07 DIAGNOSIS — I482 Chronic atrial fibrillation: Secondary | ICD-10-CM

## 2015-04-07 DIAGNOSIS — I255 Ischemic cardiomyopathy: Secondary | ICD-10-CM | POA: Diagnosis not present

## 2015-04-07 DIAGNOSIS — G9340 Encephalopathy, unspecified: Secondary | ICD-10-CM | POA: Diagnosis not present

## 2015-04-07 DIAGNOSIS — N3000 Acute cystitis without hematuria: Secondary | ICD-10-CM

## 2015-04-07 DIAGNOSIS — E785 Hyperlipidemia, unspecified: Secondary | ICD-10-CM | POA: Diagnosis not present

## 2015-04-07 DIAGNOSIS — J690 Pneumonitis due to inhalation of food and vomit: Secondary | ICD-10-CM | POA: Diagnosis not present

## 2015-04-07 DIAGNOSIS — G459 Transient cerebral ischemic attack, unspecified: Principal | ICD-10-CM

## 2015-04-07 DIAGNOSIS — Z955 Presence of coronary angioplasty implant and graft: Secondary | ICD-10-CM | POA: Diagnosis not present

## 2015-04-07 DIAGNOSIS — I2510 Atherosclerotic heart disease of native coronary artery without angina pectoris: Secondary | ICD-10-CM | POA: Diagnosis not present

## 2015-04-07 LAB — COMPREHENSIVE METABOLIC PANEL
ALK PHOS: 60 U/L (ref 38–126)
ALT: 11 U/L — AB (ref 14–54)
ANION GAP: 7 (ref 5–15)
AST: 18 U/L (ref 15–41)
Albumin: 3.3 g/dL — ABNORMAL LOW (ref 3.5–5.0)
BUN: 10 mg/dL (ref 6–20)
CALCIUM: 8.6 mg/dL — AB (ref 8.9–10.3)
CO2: 28 mmol/L (ref 22–32)
CREATININE: 0.75 mg/dL (ref 0.44–1.00)
Chloride: 105 mmol/L (ref 101–111)
Glucose, Bld: 108 mg/dL — ABNORMAL HIGH (ref 65–99)
Potassium: 3.9 mmol/L (ref 3.5–5.1)
SODIUM: 140 mmol/L (ref 135–145)
TOTAL PROTEIN: 6.2 g/dL — AB (ref 6.5–8.1)
Total Bilirubin: 0.7 mg/dL (ref 0.3–1.2)

## 2015-04-07 LAB — MAGNESIUM: MAGNESIUM: 2 mg/dL (ref 1.7–2.4)

## 2015-04-07 LAB — CBC
HCT: 36.4 % (ref 36.0–46.0)
HEMOGLOBIN: 10 g/dL — AB (ref 12.0–15.0)
MCH: 19 pg — ABNORMAL LOW (ref 26.0–34.0)
MCHC: 27.5 g/dL — ABNORMAL LOW (ref 30.0–36.0)
MCV: 69.1 fL — ABNORMAL LOW (ref 78.0–100.0)
PLATELETS: 184 10*3/uL (ref 150–400)
RBC: 5.27 MIL/uL — AB (ref 3.87–5.11)
RDW: 17.3 % — ABNORMAL HIGH (ref 11.5–15.5)
WBC: 6 10*3/uL (ref 4.0–10.5)

## 2015-04-07 LAB — HEMOGLOBIN A1C
Hgb A1c MFr Bld: 5.9 % — ABNORMAL HIGH (ref 4.8–5.6)
Mean Plasma Glucose: 123 mg/dL

## 2015-04-07 MED ORDER — DIGOXIN 0.25 MG/ML IJ SOLN
0.1250 mg | Freq: Once | INTRAMUSCULAR | Status: AC
  Administered 2015-04-07: 0.125 mg via INTRAVENOUS
  Filled 2015-04-07: qty 0.5

## 2015-04-07 MED ORDER — ZOLPIDEM TARTRATE 5 MG PO TABS
5.0000 mg | ORAL_TABLET | Freq: Once | ORAL | Status: AC
  Administered 2015-04-07: 5 mg via ORAL
  Filled 2015-04-07: qty 1

## 2015-04-07 MED ORDER — METOPROLOL TARTRATE 50 MG PO TABS
50.0000 mg | ORAL_TABLET | Freq: Two times a day (BID) | ORAL | Status: DC
  Administered 2015-04-07 – 2015-04-09 (×4): 50 mg via ORAL
  Filled 2015-04-07 (×4): qty 1

## 2015-04-07 MED ORDER — METOPROLOL TARTRATE 12.5 MG HALF TABLET
12.5000 mg | ORAL_TABLET | Freq: Once | ORAL | Status: AC
  Administered 2015-04-07: 12.5 mg via ORAL
  Filled 2015-04-07: qty 1

## 2015-04-07 NOTE — Evaluation (Signed)
SLP Cancellation Note  Patient Details Name: [name] MRN: [id number] DOB: 08/20/1940   Cancelled treatment:       Reason Eval/Treat Not Completed: SLP screened, no needs identified, will sign off (pt passed RNSSS and denies dysphagia to this clinician, please reorder if desire)   Donavan Burnetamara Mayce Noyes, MS [facility] SLP [phone]

## 2015-04-07 NOTE — Progress Notes (Signed)
[facility] PROGRESS NOTE  [name]:[id number] DOB: 09-Mar-1941 DOA: 04/05/2015 PCP: [name], MD  Length of stay: 2   Assessment/Plan: Principal Problem:   TIA (transient ischemic attack) Active Problems:   2 Vessel CAD - s/p CABG; LIMA-LAD, SVG-RCA   Chronic atrial fibrillation (HCC)   Hyperlipidemia with target LDL less than 70   Hypothyroidism   Ischemic cardiomyopathy   PAD (peripheral artery disease) (HCC)   S/P CABG x 2   Cough   History of stroke   Acute encephalopathy   Acute cystitis without hematuria   Aspiration pneumonia (HCC)   History of present illness [name] is an 75 y.o. female with a past medical history significant for HTN, hyperlipidemia, CAD s.p CABG, ischemic cardiomyopathy, PAF on apixaban (previously treated with xarelto and pradaxa), left MCA embolic infarct 81/19, and PAD, brought in by EMS due to acute onset of difficulty walking. Patient daughter said that for few hours patient was " too sleepy, no speaking well, and off balance". Further, she was complaining of HA which is unusual for her.   Assessment and plan 1. TIA - neurology is following. At this time patient appears nonfocal. MRI brain shows subacute left MCA infarct, no acute infarct. Patient is on Apixaban which will be continued. Further recommendations to be made by neurology 2. A. fib with RVR, followed by Dr. Herbie Baltimore, her amiodarone was stopped during her last hospital stay for adverse effects. Placed on digoxin and beta blocker.- Patient advised to take 50 mg of metoprolol in the evening and 25 mg in the morning. Digoxin level subtherapeutic. Dose of digoxin was increased. Heart rate remains poorly controlled. Will consult cardiology for further input. Chads 2 vasc score is 7. Patient is on Apixaban.  3. CAD status post CABG - has some left upper back pain. Cardiac enzymes negative 4. Hypothyroidism - TSH within normal limits 5. Ischemic cardiomyopathy last EF was  around 55% in August 2016 - appears compensated. 6. Hyperlipidemia: statins. 7. UTI/CAP- patient on Rocephin plus azithromycin, SLP evaluation for recent suspected subacute CVA.   DVT prophylaxsis eliquis   Code Status:      Code Status Orders        Start     Ordered   04/05/15 2321  Full code   Continuous     04/05/15 2322      Family Communication: Discussed with the patient and her daughter  Disposition Plan:  Likely go home when ready for discharge.     Consultants:  Neurology  Procedures:  None  Antibiotics: Anti-infectives    Start     Dose/Rate Route Frequency Ordered Stop   04/06/15 1400  cefTRIAXone (ROCEPHIN) 1 g in dextrose 5 % 50 mL IVPB     1 g 100 mL/hr over 30 Minutes Intravenous Every 24 hours 04/06/15 1223     04/06/15 1400  azithromycin (ZITHROMAX) 500 mg in dextrose 5 % 250 mL IVPB     500 mg 250 mL/hr over 60 Minutes Intravenous Every 24 hours 04/06/15 1223         Subjective: Patient denies any chest pain or shortness of breath. Appears to be somewhat distracted.   Objective: Filed Vitals:   04/07/15 0147 04/07/15 0303 04/07/15 0411 04/07/15 0532  BP:  98/58 98/62 113/83  Pulse: 143 130 136 129  Temp:    97.6 F (36.4 C)  TempSrc:    Oral  Resp:   17 18  Height:  Weight:      SpO2:   96% 99%   No intake or output data in the 24 hours ending 04/07/15 0903  Exam:  General: No acute distress Lungs: Clear to auscultation bilaterally without wheezes or crackles Cardiovascular: Regular rate and rhythm without murmur gallop or rub normal S1 and S2 Abdomen: Nontender, nondistended, soft, bowel sounds positive, no rebound, no ascites, no appreciable mass Awake and alert. Oriented to place, person. No facial asymmetry. Motor strength equal bilateral upper and lower extremities.   Data Review   Micro Results No results found for this or any previous visit (from the past 240 hour(s)).  Radiology Reports Ct Head Wo  Contrast  04/05/2015  CLINICAL DATA:  SLURRED SPEECH THIS AM PER FAMILY. PATIENT HAS NOT NOTICED SYMPTOMS. HX OF STROKE S/P CABG x 2. EXAM: CT HEAD WITHOUT CONTRAST TECHNIQUE: Contiguous axial images were obtained from the base of the skull through the vertex without intravenous contrast. COMPARISON:  11/18/2014 CT and MRI FINDINGS: Low-attenuation identified within the left frontal lobe, consistent with previous middle cerebral infarct, which has shown evolution since prior CT exam. There is periventricular white matter change. Small lacunar infarct within the right basal ganglia is stable. No evidence for acute infarction or intra cerebral hemorrhage. No intra or extra-axial fluid collection or mass. Bone windows demonstrate significant atherosclerotic calcification of the internal carotid arteries. There is mild mucoperiosteal thickening of the ethmoid air cells. No acute calvarial abnormality identified. IMPRESSION: 1. Changes consistent with old left middle cerebral infarct. 2.  No evidence for acute intracranial abnormality. 3. Atrophy and small vessel disease. 4. Mild chronic sinusitis. Electronically Signed   By: Norva Pavlov M.D.   On: 04/05/2015 19:15   [name] Contrast  04/06/2015  CLINICAL DATA:  Initial evaluation for transient slurred speech. EXAM: MRI HEAD WITHOUT CONTRAST TECHNIQUE: Multiplanar, multiecho pulse sequences of the brain and surrounding structures were obtained without intravenous contrast. COMPARISON:  Prior CT from 04/04/2014 as well as earlier studies. FINDINGS: Cerebral volume within normal limits for patient age. Mild patchy T2/FLAIR hyperintensity within the periventricular white matter, primarily within the periatrial white matter, most likely related to chronic small vessel ischemic disease, mild for age. Encephalomalacia within the right temporal occipital region consistent with remote ischemic infarct. There is persistent diffusion abnormality within the left MCA  territory in the left frontal lobe in the region of the operculum. No corresponding signal loss seen on ADC map, consistent with late subacute infarct. Corresponding T2/FLAIR signal within this region without mass effect. Small amount of laminar necrosis. No associated hemorrhage. No other acute infarct identified. Normal intravascular flow voids preserved. Gray-white matter differentiation otherwise maintained. No mass lesion, midline shift or mass effect. No hydrocephalus. No extra-axial fluid collection. Craniocervical junction within normal limits. ACDF partially visualized within the upper cervical spine. Pituitary gland normal. No acute abnormality about the orbits. Sequela prior bilateral lens extraction. Mild mucosal thickening within the left maxillary sinus. Paranasal sinuses otherwise largely clear. Trace opacity within the mastoid air cells. Inner ear structures grossly normal. Bone marrow signal intensity within normal limits. No scalp soft tissue abnormality. IMPRESSION: 1. No acute intracranial infarct or other process identified. 2. Late subacute left MCA territory infarct. 3. Additional chronic infarct within the posterior right temporoccipital region. 4. Mild chronic microvascular ischemic disease. Electronically Signed   By: Rise Mu M.D.   On: 04/06/2015 06:09   Dg Chest Port 1 View  04/06/2015  CLINICAL DATA:  Cough.  Stroke. EXAM:  PORTABLE CHEST 1 VIEW COMPARISON:  11/18/2014 FINDINGS: Prior CABG.  Atherosclerotic aortic arch.  Mild cardiomegaly. Emphysema noted. Indistinct interstitial and faint confluent airspace opacity in the right upper lobe. Atelectasis or scarring along the lingula. Bony demineralization noted along with dextroconvex thoracic scoliosis. IMPRESSION: 1. Interstitial and faint airspace opacity in the right upper lobe suspicious for incipient pneumonia or aspiration pneumonitis. 2. Atherosclerosis and cardiomegaly. 3. Bony demineralization. Electronically  Signed   By: Gaylyn RongWalter  Liebkemann M.D.   On: 04/06/2015 08:33     CBC  Recent Labs Lab 04/05/15 1652 04/05/15 1702 04/06/15 0636 04/07/15 0752  WBC 6.7  --  7.7 6.0  HGB 10.4* 13.3 9.9* 10.0*  HCT 36.4 39.0 36.3 36.4  PLT 202  --  184 184  MCV 68.3*  --  69.1* 69.1*  MCH 19.5*  --  18.9* 19.0*  MCHC 28.6*  --  27.3* 27.5*  RDW 17.1*  --  17.2* 17.3*  LYMPHSABS 1.3  --   --   --   MONOABS 0.5  --   --   --   EOSABS 0.2  --   --   --   BASOSABS 0.0  --   --   --     Chemistries   Recent Labs Lab 04/05/15 1652 04/05/15 1702 04/06/15 0636 04/07/15 0752  NA 141 140 141 140  K 4.0 3.8 4.0 3.9  CL 104 101 105 105  CO2 27  --  26 28  GLUCOSE 86 81 105* 108*  BUN 14 15 12 10   CREATININE 0.84 0.90 0.78 0.75  CALCIUM 9.2  --  8.7* 8.6*  AST 20  --   --  18  ALT 10*  --   --  11*  ALKPHOS 67  --   --  60  BILITOT 0.8  --   --  0.7   ------------------------------------------------------------------------------------------------------------------ estimated creatinine clearance is 37.1 mL/min (by C-G formula based on Cr of 0.75). ------------------------------------------------------------------------------------------------------------------  Recent Labs  04/06/15 0636  HGBA1C 5.9*   ------------------------------------------------------------------------------------------------------------------  Recent Labs  04/06/15 0636  CHOL 123  HDL 63  LDLCALC 47  TRIG 64  CHOLHDL 2.0   ------------------------------------------------------------------------------------------------------------------  Recent Labs  04/06/15 0240  TSH 1.529   ------------------------------------------------------------------------------------------------------------------ No results for input(s): VITAMINB12, FOLATE, FERRITIN, TIBC, IRON, RETICCTPCT in the last 72 hours.  Coagulation profile  Recent Labs Lab 04/05/15 1652  INR 1.74*    No results for input(s): DDIMER in the last  72 hours.  Cardiac Enzymes  Recent Labs Lab 04/06/15 0240 04/06/15 0636 04/06/15 1100  TROPONINI <0.03 <0.03 <0.03   ------------------------------------------------------------------------------------------------------------------ Invalid input(s): POCBNP   CBG:  Recent Labs Lab 04/05/15 1647  GLUCAP 77      Scheduled Meds: .  stroke: mapping our early stages of recovery book   Does not apply Once  . apixaban  5 mg Oral BID  . aspirin EC  81 mg Oral Daily  . azithromycin  500 mg Intravenous Q24H  . cefTRIAXone (ROCEPHIN)  IV  1 g Intravenous Q24H  . digoxin  0.25 mg Oral Daily  . levothyroxine  75 mcg Oral QAC breakfast  . lubiprostone  24 mcg Oral BID  . metoprolol tartrate  37.5 mg Oral BID  . rosuvastatin  5 mg Oral QPM   Continuous Infusions:   Principal Problem:   TIA (transient ischemic attack) Active Problems:   2 Vessel CAD - s/p CABG; LIMA-LAD, SVG-RCA   Chronic atrial fibrillation (HCC)   Hyperlipidemia with  target LDL less than 70   Hypothyroidism   Ischemic cardiomyopathy   PAD (peripheral artery disease) (HCC)   S/P CABG x 2   Cough   History of stroke   Acute encephalopathy   Acute cystitis without hematuria   Aspiration pneumonia (HCC)    Time spent: 45 minutes   [facility]  [facility] Pager [phone].   If 7PM-7AM, please contact night-coverage at [email or web address], password [facility] 04/07/2015, 9:03 AM  LOS: 2 days

## 2015-04-07 NOTE — Consult Note (Signed)
Patient ID: Brenda RinksRobina B Schroepfer MRN: [id number], DOB/AGE: 76/07/1940   Admit date: 04/05/2015   Primary Physician: Salli RealSUN,YUN, MD Primary Cardiologist: Dr. Herbie BaltimoreHarding  Pt. Profile:  75 y/o female with h/o CAD, chronic atrial fibrillation on chronic anticoagulation with Eliquis and prior h/o stroke, admitted for TIA. Afib rate is poorly controlled.   Problem List  Past Medical History  Diagnosis Date  . S/P CABG x 2 2009    LIMA-LAD, SVG-RCA, with AV fistula ligation and Maze procedure  . CAD (coronary artery disease), native coronary artery 2009    75% LAD, diffuse 75-90% RCA --> Referred for CABG + MAZE;  Cardiolite 12/2013: No ischemia or Infarction  . Ischemic cardiomyopathy 2012    EF ~40-45% by Echo  . PAF (paroxysmal atrial fibrillation) (HCC) 04/05/2012    s/p MAZE -- recurrence, cardioversion-converted to sinus bradycardia --> Now persistent despite being on Amiodarone & Pradaxa  . Essential hypertension   . Dyslipidemia, goal LDL below 70     On Crestor, followed by PCP  . COPD (chronic obstructive pulmonary disease) (HCC)   . PAD (peripheral artery disease) (HCC) 2007    s/p L Ileac A stent ; most recent Dopplers July 2012: Less than 50% reduction bilaterally. ABI 0.96 on the right 0.88 on left;;;12/27/2011   -ABI right .87 and left ABI .78  ,LEFT CIA and EIA stent normall patency, left CFA,SFA,and popliteal 0-49%; rgt proximal SFA 50-69%,rgt CIA,EIA, and CFA 0-49%  . Chronic low back pain     scoliosis & lordosis  . H/O: pneumonia   . Hypothyroidism   . Anxiety   . Cervical neck pain with evidence of disc disease     with need for surgery -- November 2015  . Osteoarthritis of back     And neck, hands,spine  . H/O ischemic left MCA stroke 11/18/2014    MRI/MRA of head: Multifocal left MCA infarct.. Also possible left ACA territory infarct -- complete occlusion of left MCA distally at M2 level. Marked left ACA attenuation.    Past Surgical History  Procedure Laterality Date  .  Iliac artery stent Left 04/06/2005    Ex Iliac - CFA (Smart STENTS -- 7x4 in EIA, 6 x 3 CFA)   . Coronary artery bypass graft  2009    LIMA-LAD, SVG-RCA (also MAZE & AV fistula ligation)  . Maze  2009    along with CABG  . Cardioversion  04/05/2012    Procedure: CARDIOVERSION;  Surgeon: Thurmon FairMihai Croitoru, MD;  [facility];  Service: Cardiovascular;  Laterality: N/A;  . Transthoracic echocardiogram  Jan 2012    EF ~40-45%, global HK; PAP ~45-50 mmHg  . Nm myoview ltd  May 2013    No ischemia or Infarct  . Abdominal hysterectomy    . Cardiac catheterization  07/30/2007    75% LAD ,3 stenoses of 75-90% in  RCA;circumflex from proximal RCA with no lesion seen,normal ramus intermediate branh; normal LV systoilc function  . Nm cardiolite ltd  12/2013    Non-gated for Afib; No Ischemia or Infarction.  Marland Kitchen. Anterior cervical decomp/discectomy fusion N/A 01/23/2014    Procedure: ANTERIOR CERVICAL DECOMPRESSION/DISCECTOMY FUSION CERVICAL FOUR-FIVE,CERVICAL FIVE-SIX,CERVICAL SIX-SEVEN;  Surgeon: Mariam DollarGary P Cram, MD;  Location: [facility] NEURO ORS;  Service: Neurosurgery;  Laterality: N/A;  . Colonoscopy with propofol N/A 11/12/2014    Procedure: COLONOSCOPY WITH PROPOFOL;  Surgeon: Charna ElizabethJyothi Mann, MD;  Location: [facility] ENDOSCOPY;  Service: Endoscopy;  Laterality: N/A;     Allergies  No Known  Allergies  HPI  75 y/o female, followed by Dr. Herbie Baltimore, with a h/o CAD s/p CABG in 2009 (LIMA-LAD, SVG-RCA), ischemic cardiomyopathy with prior EF of 40% (now 50%), chronic atrial fibrillation, chronic oral anticoagulation (previously on Xarelto, Pradaxa and now Eliquis), h/o prior CVA suffering a left MCA embolic infarct in 01/2015 as well as a h/o HTN, HLD and PAD.  She presented to [facility] ED on 04/05/15 with unsteady gait and speech difficulty. She was in atrial fibrillation w/ a RVR around 115-120 bpm but reported full compliance with Eliquis. CT of the head showed no acute abnormality. MRI showed  subacute left MCA infarct, no  acute infarct. It is felt that she suffered a TIA. Her symptoms have resolved. Neurology is following and she has been continued on Eliquis. Cardiology has been consulted for afib management/ rate control. Per records, she was on amiodarone in the past but this was apparently discontinued during her last hospital stay due to adverse effects. She has since been treated with digoxin + metoprolol. Her digoxin level at time of admit was subthreapeutic. Internal Medicine increased the dose to 0.25 mg daily. Despite this, her HR remains mildly elevated at times. TSH is WNL. K has been normal. Her BP is a bit soft but stable, ranging in the 90s-120s systolic.  Home Medications  Prior to Admission medications   Medication Sig Start Date End Date Taking? Authorizing Provider  AMITIZA 24 MCG capsule Take 24 mcg by mouth 2 (two) times daily. 03/17/15  Yes Historical Provider, MD  apixaban (ELIQUIS) 5 MG TABS tablet Take 1 tablet (5 mg total) by mouth 2 (two) times daily. 11/19/14  Yes Joseph Art, DO  aspirin EC 81 MG EC tablet Take 1 tablet (81 mg total) by mouth daily. 11/19/14  Yes Joseph Art, DO  CRESTOR 5 MG tablet TAKE 1 TABLET BY MOUTH IN THE EVENING 10/19/14  Yes Marykay Lex, MD  cyclobenzaprine (FLEXERIL) 10 MG tablet Take 10 mg by mouth 3 (three) times daily as needed for muscle spasms.   Yes Historical Provider, MD  digoxin (LANOXIN) 0.125 MG tablet Take 0.5 tablets (0.0625 mg total) by mouth daily. Patient taking differently: Take 0.125 mg by mouth daily.  03/31/15  Yes Marykay Lex, MD  HYDROcodone-acetaminophen (NORCO/VICODIN) 5-325 MG per tablet Take 1 tablet by mouth every 6 (six) hours as needed for moderate pain. 11/19/14  Yes Joseph Art, DO  levothyroxine (SYNTHROID, LEVOTHROID) 75 MCG tablet Take 75 mcg by mouth daily before breakfast.   Yes Historical Provider, MD  metoprolol (LOPRESSOR) 50 MG tablet TAKE 0.5 TABLETS (25 MG TOTAL) BY MOUTH 2 (TWO) TIMES DAILY. Patient taking  differently: TAKE 1 TABLETS (50 MG TOTAL) BY MOUTH 2 (TWO) TIMES DAILY. 01/25/15  Yes Marykay Lex, MD  bisacodyl (DULCOLAX) 10 MG suppository Place 1 suppository (10 mg total) rectally daily as needed for moderate constipation. Patient not taking: Reported on 04/05/2015 11/19/14   Joseph Art, DO  diazepam (VALIUM) 2 MG tablet Take 1 tablet (2 mg total) by mouth every 8 (eight) hours as needed for muscle spasms. Patient not taking: Reported on 04/05/2015 11/03/14   Blake Divine, MD  docusate sodium (COLACE) 100 MG capsule Take 1 capsule (100 mg total) by mouth every 12 (twelve) hours. Patient not taking: Reported on 04/05/2015 06/13/14   Derwood Kaplan, MD  feeding supplement, ENSURE ENLIVE, (ENSURE ENLIVE) LIQD Take 237 mLs by mouth 2 (two) times daily between meals. Patient not taking: Reported  on 04/05/2015 11/19/14   Joseph Art, DO  lactulose (CHRONULAC) 10 GM/15ML solution Take 15 mLs (10 g total) by mouth 2 (two) times daily as needed for mild constipation. Patient not taking: Reported on 04/05/2015 11/19/14   Joseph Art, DO  metoprolol (LOPRESSOR) 50 MG tablet TAKE 25 MG (1/2TABLET) IN MORNING , 50 MG IN THE EVENING Patient not taking: Reported on 04/05/2015 03/31/15   Marykay Lex, MD    Family History  Family History  Problem Relation Age of Onset  . Heart attack Mother   . Stroke Father   . Heart disease Brother 69    Social History  Social History   Social History  . Marital Status: Widowed    Spouse Name: N/A  . Number of Children: N/A  . Years of Education: N/A   Occupational History  . Not on file.   Social History Main Topics  . Smoking status: Former Smoker -- 0.50 packs/day    Types: Cigarettes    Quit date: 01/07/1998  . Smokeless tobacco: Never Used  . Alcohol Use: No  . Drug Use: No  . Sexual Activity: Not on file   Other Topics Concern  . Not on file   Social History Narrative    She is a divorced mother of 1 and one child who died. Her daughter  is here with her   today. She has got 4 grandchildren. She is a native of [place] and only has an occasional alcoholic   beverage. She does not get routine exercise mostly because of her spine pain. She did previously smoke but quit many years ago.      Review of Systems General:  No chills, fever, night sweats or weight changes.  Cardiovascular:  No chest pain, dyspnea on exertion, edema, orthopnea, palpitations, paroxysmal nocturnal dyspnea. Dermatological: No rash, lesions/masses Respiratory: No cough, dyspnea Urologic: No hematuria, dysuria Abdominal:   No nausea, vomiting, diarrhea, bright red blood per rectum, melena, or hematemesis Neurologic:  No visual changes, wkns, changes in mental status. All other systems reviewed and are otherwise negative except as noted above.  Physical Exam  Blood pressure 91/79, pulse 75, temperature 98.3 F (36.8 C), temperature source Oral, resp. rate 20, height 5' (1.524 m), weight 85 lb 5.1 oz (38.7 kg), SpO2 97 %.  General: Pleasant, NAD, frail appearing Psych: Normal affect. Neuro: Alert and oriented X 3. Moves all extremities spontaneously. HEENT: Normal  Neck: Supple without bruits or JVD. Lungs:  Resp regular and unlabored, CTA. Heart: irregularly irregular, no s3, s4, or murmurs. Abdomen: Soft, non-tender, non-distended, BS + x 4.  Extremities: No clubbing, cyanosis or edema. DP/PT/Radials 2+ and equal bilaterally.  Labs  Troponin [facility] of Care Test)  Recent Labs  04/05/15 1700  TROPIPOC 0.00    Recent Labs  04/06/15 0240 04/06/15 0636 04/06/15 1100  TROPONINI <0.03 <0.03 <0.03   Lab Results  Component Value Date   WBC 6.0 04/07/2015   HGB 10.0* 04/07/2015   HCT 36.4 04/07/2015   MCV 69.1* 04/07/2015   PLT 184 04/07/2015    Recent Labs Lab 04/07/15 0752  NA 140  K 3.9  CL 105  CO2 28  BUN 10  CREATININE 0.75  CALCIUM 8.6*  PROT 6.2*  BILITOT 0.7  ALKPHOS 60  ALT 11*  AST 18  GLUCOSE 108*   Lab  Results  Component Value Date   CHOL 123 04/06/2015   HDL 63 04/06/2015   LDLCALC 47 04/06/2015  TRIG 64 04/06/2015   Lab Results  Component Value Date   DDIMER * 07/27/2007    3.31        AT THE INHOUSE ESTABLISHED CUTOFF VALUE OF 0.48 ug/mL FEU, THIS ASSAY HAS BEEN DOCUMENTED IN THE LITERATURE TO HAVE     Radiology/Studies  Ct Head Wo Contrast  04/05/2015  CLINICAL DATA:  SLURRED SPEECH THIS AM PER FAMILY. PATIENT HAS NOT NOTICED SYMPTOMS. HX OF STROKE S/P CABG x 2. EXAM: CT HEAD WITHOUT CONTRAST TECHNIQUE: Contiguous axial images were obtained from the base of the skull through the vertex without intravenous contrast. COMPARISON:  11/18/2014 CT and MRI FINDINGS: Low-attenuation identified within the left frontal lobe, consistent with previous middle cerebral infarct, which has shown evolution since prior CT exam. There is periventricular white matter change. Small lacunar infarct within the right basal ganglia is stable. No evidence for acute infarction or intra cerebral hemorrhage. No intra or extra-axial fluid collection or mass. Bone windows demonstrate significant atherosclerotic calcification of the internal carotid arteries. There is mild mucoperiosteal thickening of the ethmoid air cells. No acute calvarial abnormality identified. IMPRESSION: 1. Changes consistent with old left middle cerebral infarct. 2.  No evidence for acute intracranial abnormality. 3. Atrophy and small vessel disease. 4. Mild chronic sinusitis. Electronically Signed   By: Norva Pavlov M.D.   On: 04/05/2015 19:15   [name] Contrast  04/06/2015  CLINICAL DATA:  Initial evaluation for transient slurred speech. EXAM: MRI HEAD WITHOUT CONTRAST TECHNIQUE: Multiplanar, multiecho pulse sequences of the brain and surrounding structures were obtained without intravenous contrast. COMPARISON:  Prior CT from 04/04/2014 as well as earlier studies. FINDINGS: Cerebral volume within normal limits for patient age. Mild  patchy T2/FLAIR hyperintensity within the periventricular white matter, primarily within the periatrial white matter, most likely related to chronic small vessel ischemic disease, mild for age. Encephalomalacia within the right temporal occipital region consistent with remote ischemic infarct. There is persistent diffusion abnormality within the left MCA territory in the left frontal lobe in the region of the operculum. No corresponding signal loss seen on ADC map, consistent with late subacute infarct. Corresponding T2/FLAIR signal within this region without mass effect. Small amount of laminar necrosis. No associated hemorrhage. No other acute infarct identified. Normal intravascular flow voids preserved. Gray-white matter differentiation otherwise maintained. No mass lesion, midline shift or mass effect. No hydrocephalus. No extra-axial fluid collection. Craniocervical junction within normal limits. ACDF partially visualized within the upper cervical spine. Pituitary gland normal. No acute abnormality about the orbits. Sequela prior bilateral lens extraction. Mild mucosal thickening within the left maxillary sinus. Paranasal sinuses otherwise largely clear. Trace opacity within the mastoid air cells. Inner ear structures grossly normal. Bone marrow signal intensity within normal limits. No scalp soft tissue abnormality. IMPRESSION: 1. No acute intracranial infarct or other process identified. 2. Late subacute left MCA territory infarct. 3. Additional chronic infarct within the posterior right temporoccipital region. 4. Mild chronic microvascular ischemic disease. Electronically Signed   By: Rise Mu M.D.   On: 04/06/2015 06:09   Dg Chest Port 1 View  04/06/2015  CLINICAL DATA:  Cough.  Stroke. EXAM: PORTABLE CHEST 1 VIEW COMPARISON:  11/18/2014 FINDINGS: Prior CABG.  Atherosclerotic aortic arch.  Mild cardiomegaly. Emphysema noted. Indistinct interstitial and faint confluent airspace opacity in the  right upper lobe. Atelectasis or scarring along the lingula. Bony demineralization noted along with dextroconvex thoracic scoliosis. IMPRESSION: 1. Interstitial and faint airspace opacity in the right upper lobe suspicious for  incipient pneumonia or aspiration pneumonitis. 2. Atherosclerosis and cardiomegaly. 3. Bony demineralization. Electronically Signed   By: Gaylyn Rong M.D.   On: 04/06/2015 08:33    ECG  Chronic atrial fibrillation  ASSESSMENT AND PLAN  Principal Problem:   TIA (transient ischemic attack) Active Problems:   2 Vessel CAD - s/p CABG; LIMA-LAD, SVG-RCA   Chronic atrial fibrillation (HCC)   Hyperlipidemia with target LDL less than 70   Hypothyroidism   Ischemic cardiomyopathy   PAD (peripheral artery disease) (HCC)   S/P CABG x 2   Cough   History of stroke   Acute encephalopathy   Acute cystitis without hematuria   Aspiration pneumonia (HCC)   1. Chronic Atrial Fibrillation: she has had elevated rates since being admitted, up to the 115-120 range. She is currently in the 90s-low 100s resting. Amiodarone was discontinued recently due to intolerance. She is currently on metoprolol 37.5 mg BID and digoxin 0.25 mg daily. Echo from 04/06/15 shows low normal LVEF at 50%. Her SBPs have ranged in between the 90s-120s. We will try increasing her metoprolol dose to 50 mg BID. Also ? If she would be a candidate for afib ablation later down the road, once she recovers from this hospitalization.   2. TIA: per neurology, continue Eliquis.   3. CAD: s/p CABG. She denies CP.      Signed, Robbie Lis, PA-C 04/07/2015, 2:33 PM   I have examined the patient and reviewed assessment and plan and discussed with patient.  Agree with above as stated.  Back in AFib.  Eliquis for stroke prevention.  Increase Metoptolol to 50 mg BID for rate control as this is less likely to lower her BP as cardizem.  She is asymptomatic at this time.  Rate control has improved.  Digoxin  increased. Will have to check level so that we do not create toxicity.  Terell Kincy S.

## 2015-04-07 NOTE — Progress Notes (Addendum)
HR sustaining in the 130s this evening. Has gone into the 180's when out of bed into the bathroom. BP too low for evening dose of Metoprolol. MD paged. Monitoring patient closely. Britiany Silbernagel, [place] ScrapeSarah E, RN   New orders given. Repeat Vitals in 30 min.   Manual BP 98/62, HR 136, 02 96% RA, RR17

## 2015-04-07 NOTE — Care Management Note (Signed)
Case Management Note  Patient Details  Name: [name] MRN: [id number] Date of Birth: 09/25/1940  Subjective/Objective:  Patient admitted to r/o CVA. MRI results negative. IV abx for UTI. Patient is from home with family.                 Action/Plan: No follow up per PT/OT. CM will continue to follow for discharge needs.   Expected Discharge Date:  02/09/15               Expected Discharge Plan:     In-House Referral:     Discharge planning Services     Post Acute Care Choice:    Choice offered to:     DME Arranged:    DME Agency:     HH Arranged:    HH Agency:     Status of Service:  In process, will continue to follow  Medicare Important Message Given:    Date Medicare IM Given:    Medicare IM give by:    Date Additional Medicare IM Given:    Additional Medicare Important Message give by:     If discussed at Long Length of Stay Meetings, dates discussed:    Additional Comments:  Kermit BaloKelli F Shekita Boyden, RN 04/07/2015, 3:07 PM

## 2015-04-07 NOTE — Progress Notes (Signed)
STROKE TEAM PROGRESS NOTE   HISTORY [name] is an 75 y.o. 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 Patient daughter is at the bedside and said that for few hours this morning patient was " too sleepy, no speaking well, and off balance". Further, she was complaining of HA which is unusual for her. Denies associated vertigo, double vision, difficulty swallowing, focal weakness or numbness, confusion, or vision impairment. Taking apixaban religiously. CT head was personally reviewed and showed no acute abnormality.  Date last known well: 04/04/15 Time last known well: 8 pm tPA Given: no, symptoms resolved   SUBJECTIVE (INTERVAL HISTORY) No family is at the bedside. The patient has known atrial fibrillation and cardiology has been working on rate control. UA showed UTI and CXR showed possible pneumonia. She is on abx. Currently neuro improving. Pt feels much better. Sitting in bed without complains.   OBJECTIVE Temp:  [97.6 F (36.4 C)-98.3 F (36.8 C)] 98.3 F (36.8 C) (01/04 1416) Pulse Rate:  [75-143] 75 (01/04 1416) Cardiac Rhythm:  [-] Atrial fibrillation (01/04 0700) Resp:  [16-20] 20 (01/04 1416) BP: (91-129)/(55-83) 91/79 mmHg (01/04 1416) SpO2:  [96 %-99 %] 97 % (01/04 1416)  CBC:   Recent Labs Lab 04/05/15 1652  04/06/15 0636 04/07/15 0752  WBC 6.7  --  7.7 6.0  NEUTROABS 4.7  --   --   --   HGB 10.4*  < > 9.9* 10.0*  HCT 36.4  < > 36.3 36.4  MCV 68.3*  --  69.1* 69.1*  PLT 202  --  184 184  < > = values in this interval not displayed.  Basic Metabolic Panel:   Recent Labs Lab 04/06/15 0636 04/07/15 0752  NA 141 140  K 4.0 3.9  CL 105 105  CO2 26 28  GLUCOSE 105* 108*  BUN 12 10  CREATININE 0.78 0.75   CALCIUM 8.7* 8.6*  MG  --  2.0    Lipid Panel:     Component Value Date/Time   CHOL 123 04/06/2015 0636   TRIG 64 04/06/2015 0636   HDL 63 04/06/2015 0636   CHOLHDL 2.0 04/06/2015 0636   VLDL 13 04/06/2015 0636   LDLCALC 47 04/06/2015 0636   HgbA1c:  Lab Results  Component Value Date   HGBA1C 5.9* 04/06/2015   Urine Drug Screen: No results found for: LABOPIA, COCAINSCRNUR, LABBENZ, AMPHETMU, THCU, LABBARB    IMAGING I have personally reviewed the radiological images below and agree with the radiology interpretations.  Ct Head Wo Contrast 04/05/2015   1. Changes consistent with old left middle cerebral infarct.  2. No evidence for acute intracranial abnormality.  3. Atrophy and small vessel disease.  4. Mild chronic sinusitis.   [name] Contrast 04/06/2015   1. No acute intracranial infarct or other process identified.  2. Late subacute left MCA territory infarct.  3. Additional chronic infarct within the posterior right temporoccipital region.  4. Mild chronic microvascular ischemic disease.   CXR - 1. Interstitial and faint airspace opacity in the right upper lobe suspicious for incipient pneumonia or aspiration pneumonitis. 2. Atherosclerosis and cardiomegaly. 3. Bony demineralization.  2D echo - - Left ventricle: The cavity size was normal. Systolic function was mildly to moderately reduced. The estimated ejection fraction was  50%. Wall motion was normal; there were no regional wall motion abnormalities. The study was not technically sufficient to allow evaluation of LV diastolic dysfunction due to atrial fibrillation. - Ventricular septum: Septal motion showed paradox. - Aortic valve: There was no regurgitation. - Aortic root: The aortic root was normal in size. - Mitral valve: Calcified annulus. Mildly thickened leaflets . Transvalvular velocity was within the normal range. There was no evidence for stenosis. - Left atrium: The atrium was  moderately dilated. - Right ventricle: The cavity size was moderately dilated. Wall thickness was normal. Systolic function was moderately reduced. - Right atrium: The atrium was severely dilated. - Tricuspid valve: There was moderate regurgitation. - Pulmonic valve: Structurally normal valve. There was mild regurgitation. - Pulmonary arteries: Systolic pressure was severely increased. PA peak pressure: 59 mm Hg (S). - Inferior vena cava: The vessel was dilated. The respirophasic diameter changes were blunted (< 50%), consistent with elevated central venous pressure. - Pericardium, extracardiac: There was no pericardial effusion.  PHYSICAL EXAM  Temp:  [97.6 F (36.4 C)-98.3 F (36.8 C)] 98.3 F (36.8 C) (01/04 1416) Pulse Rate:  [75-143] 75 (01/04 1416) Resp:  [16-20] 20 (01/04 1416) BP: (91-129)/(55-83) 91/79 mmHg (01/04 1416) SpO2:  [96 %-99 %] 97 % (01/04 1416)  General - Well nourished, well developed, in no apparent distress.  Ophthalmologic - Fundi not visualized due to noncooperation.  Cardiovascular - irregularly irregular heart rate and rhythm.  Mental Status -  Level of arousal and orientation to place, and person were intact, but not orientated to time. Language including expression, naming, repetition, comprehension was assessed and found intact. Fund of Knowledge was assessed and was intact.  Cranial Nerves II - XII - II - Visual field intact OU. III, IV, VI - Extraocular movements intact. V - Facial sensation intact bilaterally. VII - mild right facial droop VIII - Hearing & vestibular intact bilaterally. X - Palate elevates symmetrically. XI - Chin turning & shoulder shrug intact bilaterally. XII - Tongue protrusion intact.  Motor Strength - The patient's strength was normal in all extremities and pronator drift was absent.  Bulk was normal and fasciculations were absent.   Motor Tone - Muscle tone was assessed at the neck and appendages and  was normal.  Reflexes - The patient's reflexes were 1+ in all extremities and she had no pathological reflexes.  Sensory - Light touch, temperature/pinprick were assessed and were symmetrical.    Coordination - The patient had normal movements in the hands and feet with no ataxia or dysmetria.  Tremor was absent.  Gait and Station - not tested due to safety concerns.   ASSESSMENT/PLAN [name] is a 75 y.o. female with history of atrial fibrillation s/p MAZE on Eliquis, CAD s/p CABG x 2, ischemic cardiomyopathy, HTN, HLD, COPD, PVD, and previous stroke in 11/2014 presenting with unsteadiness, dysarthria, and drowsiness. She did not receive IV t-PA due to a solution of deficits   Encephalopathy with recrudescence of old stroke  UA showed UTI  CXR concerning for aspiration   Presented with imbalance, AMS, speech difficulty, HA  MRI no acute stroke  On rocephin and azithromycin  Hx of stroke - left MCA territory infarct in 11/2014 due to atrial fibrillation.  Resultant - mild right facial droop  MRI - No acute infarct identified.  Carotid Doppler - not ordered (unremarkable in 11/2014)  2D Echo - EF 50%. No cardiac source of emboli other than known atrial fibrillation.  LDL -  47  HgbA1c 5.9  VTE prophylaxis - Eliquis Diet Heart Room service appropriate?: Yes; Fluid consistency:: Thin  aspirin 81 mg daily and Eliquis (apixaban) daily prior to admission, now on aspirin 81 mg daily and Eliquis (apixaban) daily.  Patient counseled to be compliant with her antithrombotic medications  Ongoing aggressive stroke risk factor management  Therapy recommendations: No follow-up physical therapy  Disposition: Pending  Left MCA occlusion  Due to acute stroke in 11/2014  BP goal 130-150  Avoid hypotension  Recommend to be precautions with BP meds.   afib with RVR  Following with Dr. Herbie Baltimore  On metoprolol and digoxin  Recently increased metoprolol  dose  Recommend avoid hypotension due to left MCA infarct  May consider adding cardizam   Digoxin level was low   Hyperlipidemia  Home meds: Crestor 5 mg daily  resumed in hospital  LDL 47,  goal < 70  Continue statin at discharge  Other Stroke Risk Factors  Advanced age  Cigarette smoker, quit smoking 17 years ago.  Family hx stroke (father)  Coronary artery disease s/p CABG x 2 - on ASA   Other Active Problems  Anemia  Hospital day # 2  Neurology will sign off. Please call with questions. Pt will follow up with [facility]. Roda Shutters at [facility] in about 3 months. Thanks for the consult.  Marvel Plan, MD PhD Stroke Neurology 04/07/2015 4:21 PM    To contact Stroke Continuity provider, please refer to [email or web address]. After hours, contact General Neurology

## 2015-04-08 DIAGNOSIS — N3000 Acute cystitis without hematuria: Secondary | ICD-10-CM | POA: Diagnosis not present

## 2015-04-08 LAB — BASIC METABOLIC PANEL
ANION GAP: 9 (ref 5–15)
BUN: 13 mg/dL (ref 6–20)
CALCIUM: 8.8 mg/dL — AB (ref 8.9–10.3)
CO2: 28 mmol/L (ref 22–32)
Chloride: 105 mmol/L (ref 101–111)
Creatinine, Ser: 0.79 mg/dL (ref 0.44–1.00)
Glucose, Bld: 89 mg/dL (ref 65–99)
Potassium: 4.6 mmol/L (ref 3.5–5.1)
SODIUM: 142 mmol/L (ref 135–145)

## 2015-04-08 LAB — CBC
HEMATOCRIT: 34.4 % — AB (ref 36.0–46.0)
Hemoglobin: 9.6 g/dL — ABNORMAL LOW (ref 12.0–15.0)
MCH: 19.5 pg — ABNORMAL LOW (ref 26.0–34.0)
MCHC: 27.9 g/dL — ABNORMAL LOW (ref 30.0–36.0)
MCV: 69.9 fL — ABNORMAL LOW (ref 78.0–100.0)
Platelets: 145 10*3/uL — ABNORMAL LOW (ref 150–400)
RBC: 4.92 MIL/uL (ref 3.87–5.11)
RDW: 17.4 % — AB (ref 11.5–15.5)
WBC: 5.4 10*3/uL (ref 4.0–10.5)

## 2015-04-08 LAB — DIGOXIN LEVEL: DIGOXIN LVL: 2.7 ng/mL — AB (ref 0.8–2.0)

## 2015-04-08 MED ORDER — AZITHROMYCIN 500 MG PO TABS
500.0000 mg | ORAL_TABLET | Freq: Every day | ORAL | Status: DC
  Administered 2015-04-08 – 2015-04-09 (×2): 500 mg via ORAL
  Filled 2015-04-08 (×3): qty 1

## 2015-04-08 NOTE — Progress Notes (Signed)
[facility] PROGRESS NOTE  [name] ZOX:[id number] DOB: 06-11-1940 DOA: 04/05/2015 PCP: [name], MD  Length of stay: 3   Assessment/Plan: Principal Problem:   TIA (transient ischemic attack) Active Problems:   2 Vessel CAD - s/p CABG; LIMA-LAD, SVG-RCA   Chronic atrial fibrillation (HCC)   Hyperlipidemia with target LDL less than 70   Hypothyroidism   Ischemic cardiomyopathy   PAD (peripheral artery disease) (HCC)   S/P CABG x 2   Cough   History of stroke   Acute encephalopathy   Acute cystitis without hematuria   Aspiration pneumonia (HCC)   History of present illness [name] is an 75 y.o. female with a past medical history significant for HTN, hyperlipidemia, CAD s.p CABG, ischemic cardiomyopathy, PAF on apixaban (previously treated with xarelto and pradaxa), left MCA embolic infarct 81/19, and PAD, brought in by EMS due to acute onset of difficulty walking. Patient daughter said that for few hours patient was " too sleepy, no speaking well, and off balance". Further, she was complaining of HA which is unusual for her.   Assessment and plan 1. TIA - neurology is following. At this time patient appears nonfocal. MRI brain shows subacute left MCA infarct, no acute infarct. Patient is on Apixaban which will be continued. Further recommendations to be made by neurology 2. A. fib with RVR, followed by Dr. Herbie Baltimore, her amiodarone was stopped during her last hospital stay for adverse effects.  Currently on digoxin 0.25 mg and metoprolol 50 mg BID for rate control . Heart rate remains poorly controlled.Appreciate cardiology input. Chads 2 vasc score is 7. Patient is on Apixaban.  3. CAD status post CABG - has some left upper back pain. Cardiac enzymes negative 4. Hypothyroidism - TSH within normal limits 5. Ischemic cardiomyopathy last EF was around 55% in August 2016 - appears compensated. 6. Hyperlipidemia: statins. 7. UTI/CAP- patient on Rocephin plus  azithromycin, SLP evaluation for recent suspected subacute CVA reassuring.   DVT prophylaxsis eliquis   Code Status:      Code Status Orders        Start     Ordered   04/05/15 2321  Full code   Continuous     04/05/15 2322      Family Communication: Discussed with the patient and her daughter  Disposition Plan:  Likely go home when ready for discharge.     Consultants:  Neurology  Cardiology  Procedures:  None  Antibiotics: Anti-infectives    Start     Dose/Rate Route Frequency Ordered Stop   04/06/15 1400  cefTRIAXone (ROCEPHIN) 1 g in dextrose 5 % 50 mL IVPB     1 g 100 mL/hr over 30 Minutes Intravenous Every 24 hours 04/06/15 1223     04/06/15 1400  azithromycin (ZITHROMAX) 500 mg in dextrose 5 % 250 mL IVPB     500 mg 250 mL/hr over 60 Minutes Intravenous Every 24 hours 04/06/15 1223         Subjective: Continues to be in A. fib with RVR, heart rate fluctuating between 80s to 120s  Objective: Filed Vitals:   04/07/15 1852 04/07/15 2106 04/08/15 0113 04/08/15 0502  BP: 114/72 112/64 112/64 96/59  Pulse: 109 103 128 84  Temp: 98.2 F (36.8 C) 97.8 F (36.6 C) 97.8 F (36.6 C) 98.5 F (36.9 C)  TempSrc: Oral Oral Oral Oral  Resp: 20 18 16 16   Height:      Weight:  SpO2: 100% 97% 98% 98%   No intake or output data in the 24 hours ending 04/08/15 1610  Exam:  General: No acute distress Lungs: Clear to auscultation bilaterally without wheezes or crackles Cardiovascular: Regular rate and rhythm without murmur gallop or rub normal S1 and S2 Abdomen: Nontender, nondistended, soft, bowel sounds positive, no rebound, no ascites, no appreciable mass Awake and alert. Oriented to place, person. No facial asymmetry. Motor strength equal bilateral upper and lower extremities.   Data Review   Micro Results No results found for this or any previous visit (from the past 240 hour(s)).  Radiology Reports Ct Head Wo Contrast  04/05/2015  CLINICAL  DATA:  SLURRED SPEECH THIS AM PER FAMILY. PATIENT HAS NOT NOTICED SYMPTOMS. HX OF STROKE S/P CABG x 2. EXAM: CT HEAD WITHOUT CONTRAST TECHNIQUE: Contiguous axial images were obtained from the base of the skull through the vertex without intravenous contrast. COMPARISON:  11/18/2014 CT and MRI FINDINGS: Low-attenuation identified within the left frontal lobe, consistent with previous middle cerebral infarct, which has shown evolution since prior CT exam. There is periventricular white matter change. Small lacunar infarct within the right basal ganglia is stable. No evidence for acute infarction or intra cerebral hemorrhage. No intra or extra-axial fluid collection or mass. Bone windows demonstrate significant atherosclerotic calcification of the internal carotid arteries. There is mild mucoperiosteal thickening of the ethmoid air cells. No acute calvarial abnormality identified. IMPRESSION: 1. Changes consistent with old left middle cerebral infarct. 2.  No evidence for acute intracranial abnormality. 3. Atrophy and small vessel disease. 4. Mild chronic sinusitis. Electronically Signed   By: Norva Pavlov M.D.   On: 04/05/2015 19:15   [name] Contrast  04/06/2015  CLINICAL DATA:  Initial evaluation for transient slurred speech. EXAM: MRI HEAD WITHOUT CONTRAST TECHNIQUE: Multiplanar, multiecho pulse sequences of the brain and surrounding structures were obtained without intravenous contrast. COMPARISON:  Prior CT from 04/04/2014 as well as earlier studies. FINDINGS: Cerebral volume within normal limits for patient age. Mild patchy T2/FLAIR hyperintensity within the periventricular white matter, primarily within the periatrial white matter, most likely related to chronic small vessel ischemic disease, mild for age. Encephalomalacia within the right temporal occipital region consistent with remote ischemic infarct. There is persistent diffusion abnormality within the left MCA territory in the left frontal lobe  in the region of the operculum. No corresponding signal loss seen on ADC map, consistent with late subacute infarct. Corresponding T2/FLAIR signal within this region without mass effect. Small amount of laminar necrosis. No associated hemorrhage. No other acute infarct identified. Normal intravascular flow voids preserved. Gray-white matter differentiation otherwise maintained. No mass lesion, midline shift or mass effect. No hydrocephalus. No extra-axial fluid collection. Craniocervical junction within normal limits. ACDF partially visualized within the upper cervical spine. Pituitary gland normal. No acute abnormality about the orbits. Sequela prior bilateral lens extraction. Mild mucosal thickening within the left maxillary sinus. Paranasal sinuses otherwise largely clear. Trace opacity within the mastoid air cells. Inner ear structures grossly normal. Bone marrow signal intensity within normal limits. No scalp soft tissue abnormality. IMPRESSION: 1. No acute intracranial infarct or other process identified. 2. Late subacute left MCA territory infarct. 3. Additional chronic infarct within the posterior right temporoccipital region. 4. Mild chronic microvascular ischemic disease. Electronically Signed   By: Rise Mu M.D.   On: 04/06/2015 06:09   Dg Chest Port 1 View  04/06/2015  CLINICAL DATA:  Cough.  Stroke. EXAM: PORTABLE CHEST 1 VIEW COMPARISON:  11/18/2014 FINDINGS: Prior CABG.  Atherosclerotic aortic arch.  Mild cardiomegaly. Emphysema noted. Indistinct interstitial and faint confluent airspace opacity in the right upper lobe. Atelectasis or scarring along the lingula. Bony demineralization noted along with dextroconvex thoracic scoliosis. IMPRESSION: 1. Interstitial and faint airspace opacity in the right upper lobe suspicious for incipient pneumonia or aspiration pneumonitis. 2. Atherosclerosis and cardiomegaly. 3. Bony demineralization. Electronically Signed   By: Gaylyn RongWalter  Liebkemann M.D.    On: 04/06/2015 08:33     CBC  Recent Labs Lab 04/05/15 1652 04/05/15 1702 04/06/15 0636 04/07/15 0752 04/08/15 0410  WBC 6.7  --  7.7 6.0 5.4  HGB 10.4* 13.3 9.9* 10.0* 9.6*  HCT 36.4 39.0 36.3 36.4 34.4*  PLT 202  --  184 184 145*  MCV 68.3*  --  69.1* 69.1* 69.9*  MCH 19.5*  --  18.9* 19.0* 19.5*  MCHC 28.6*  --  27.3* 27.5* 27.9*  RDW 17.1*  --  17.2* 17.3* 17.4*  LYMPHSABS 1.3  --   --   --   --   MONOABS 0.5  --   --   --   --   EOSABS 0.2  --   --   --   --   BASOSABS 0.0  --   --   --   --     Chemistries   Recent Labs Lab 04/05/15 1652 04/05/15 1702 04/06/15 0636 04/07/15 0752 04/08/15 0410  NA 141 140 141 140 142  K 4.0 3.8 4.0 3.9 4.6  CL 104 101 105 105 105  CO2 27  --  26 28 28   GLUCOSE 86 81 105* 108* 89  BUN 14 15 12 10 13   CREATININE 0.84 0.90 0.78 0.75 0.79  CALCIUM 9.2  --  8.7* 8.6* 8.8*  MG  --   --   --  2.0  --   AST 20  --   --  18  --   ALT 10*  --   --  11*  --   ALKPHOS 67  --   --  60  --   BILITOT 0.8  --   --  0.7  --    ------------------------------------------------------------------------------------------------------------------ estimated creatinine clearance is 37.1 mL/min (by C-G formula based on Cr of 0.79). ------------------------------------------------------------------------------------------------------------------  Recent Labs  04/06/15 0636  HGBA1C 5.9*   ------------------------------------------------------------------------------------------------------------------  Recent Labs  04/06/15 0636  CHOL 123  HDL 63  LDLCALC 47  TRIG 64  CHOLHDL 2.0   ------------------------------------------------------------------------------------------------------------------  Recent Labs  04/06/15 0240  TSH 1.529   ------------------------------------------------------------------------------------------------------------------ No results for input(s): VITAMINB12, FOLATE, FERRITIN, TIBC, IRON, RETICCTPCT in the  last 72 hours.  Coagulation profile  Recent Labs Lab 04/05/15 1652  INR 1.74*    No results for input(s): DDIMER in the last 72 hours.  Cardiac Enzymes  Recent Labs Lab 04/06/15 0240 04/06/15 0636 04/06/15 1100  TROPONINI <0.03 <0.03 <0.03   ------------------------------------------------------------------------------------------------------------------ Invalid input(s): POCBNP   CBG:  Recent Labs Lab 04/05/15 1647  GLUCAP 77      Scheduled Meds: .  stroke: mapping our early stages of recovery book   Does not apply Once  . apixaban  5 mg Oral BID  . aspirin EC  81 mg Oral Daily  . azithromycin  500 mg Intravenous Q24H  . cefTRIAXone (ROCEPHIN)  IV  1 g Intravenous Q24H  . digoxin  0.25 mg Oral Daily  . levothyroxine  75 mcg Oral QAC breakfast  . lubiprostone  24 mcg Oral BID  .  metoprolol tartrate  50 mg Oral BID  . rosuvastatin  5 mg Oral QPM   Continuous Infusions:   Principal Problem:   TIA (transient ischemic attack) Active Problems:   2 Vessel CAD - s/p CABG; LIMA-LAD, SVG-RCA   Chronic atrial fibrillation (HCC)   Hyperlipidemia with target LDL less than 70   Hypothyroidism   Ischemic cardiomyopathy   PAD (peripheral artery disease) (HCC)   S/P CABG x 2   Cough   History of stroke   Acute encephalopathy   Acute cystitis without hematuria   Aspiration pneumonia (HCC)    Time spent: 45 minutes   [facility]  [facility] Pager [phone]  If 7PM-7AM, please contact night-coverage at [email or web address], password [facility] 04/08/2015, 9:52 AM  LOS: 3 days

## 2015-04-08 NOTE — Progress Notes (Signed)
PT Cancellation Note  Patient Details Name: [name] MRN: [id number] DOB: 04/05/1940   Cancelled Treatment:    Reason Eval/Treat Not Completed: Patient with multiple staff in room and unavailable to participate with PT at this time. Will check back as schedule allows.    Conni SlipperKirkman, Dalbert Stillings 04/08/2015, 12:01 PM   Conni SlipperLaura Jaree Dwight, PT, DPT Acute Rehabilitation Services Pager: [phone]

## 2015-04-08 NOTE — Progress Notes (Signed)
PROGRESS NOTE  Subjective:    75 y/o female with h/o CAD, chronic atrial fibrillation on chronic anticoagulation with Eliquis and prior h/o stroke, admitted for TIA. Afib rate is poorly controlled  Objective:    Vital Signs:   Temp:  [97.8 F (36.6 C)-98.5 F (36.9 C)] 98.3 F (36.8 C) (01/05 1418) Pulse Rate:  [58-128] 58 (01/05 1418) Resp:  [16-20] 18 (01/05 1418) BP: (96-114)/(59-72) 101/67 mmHg (01/05 1418) SpO2:  [97 %-100 %] 97 % (01/05 1418)  Last BM Date: 04/05/15   24-hour weight change: Weight change:   Weight trends: Filed Weights   04/05/15 1649 04/05/15 2122  Weight: 86 lb (39.009 kg) 85 lb 5.1 oz (38.7 kg)    Intake/Output:        Physical Exam: BP 101/67 mmHg  Pulse 58  Temp(Src) 98.3 F (36.8 C) (Oral)  Resp 18  Ht 5' (1.524 m)  Wt 85 lb 5.1 oz (38.7 kg)  BMI 16.66 kg/m2  SpO2 97%  Wt Readings from Last 3 Encounters:  04/05/15 85 lb 5.1 oz (38.7 kg)  03/31/15 86 lb 9.6 oz (39.282 kg)  11/17/14 88 lb 8 oz (40.143 kg)    General: Vital signs reviewed and noted.   Head: Normocephalic, atraumatic.  Eyes: conjunctivae/corneas clear.  EOM's intact.   Throat: normal  Neck:  normal  Lungs:    clear   Heart:  Irreg. Irreg.   Abdomen:  Soft, non-tender, non-distended    Extremities: No edema    Neurologic: A&O X3, CN II - XII are grossly intact.   Psych: Normal     Labs: BMET:  Recent Labs  04/07/15 0752 04/08/15 0410  NA 140 142  K 3.9 4.6  CL 105 105  CO2 28 28  GLUCOSE 108* 89  BUN 10 13  CREATININE 0.75 0.79  CALCIUM 8.6* 8.8*  MG 2.0  --     Liver function tests:  Recent Labs  04/07/15 0752  AST 18  ALT 11*  ALKPHOS 60  BILITOT 0.7  PROT 6.2*  ALBUMIN 3.3*   No results for input(s): LIPASE, AMYLASE in the last 72 hours.  CBC:  Recent Labs  04/07/15 0752 04/08/15 0410  WBC 6.0 5.4  HGB 10.0* 9.6*  HCT 36.4 34.4*  MCV 69.1* 69.9*  PLT 184 145*    Cardiac Enzymes:  Recent Labs   04/06/15 0240 04/06/15 0636 04/06/15 1100  TROPONINI <0.03 <0.03 <0.03    Coagulation Studies: No results for input(s): LABPROT, INR in the last 72 hours.  Other: Invalid input(s): POCBNP No results for input(s): DDIMER in the last 72 hours.  Recent Labs  04/06/15 0636  HGBA1C 5.9*    Recent Labs  04/06/15 0636  CHOL 123  HDL 63  LDLCALC 47  TRIG 64  CHOLHDL 2.0    Recent Labs  04/06/15 0240  TSH 1.529   No results for input(s): VITAMINB12, FOLATE, FERRITIN, TIBC, IRON, RETICCTPCT in the last 72 hours.   Other results:  EKG  ( personally reviewed )   afib at 104  Medications:    Infusions:    Scheduled Medications: .  stroke: mapping our early stages of recovery book   Does not apply Once  . apixaban  5 mg Oral BID  . aspirin EC  81 mg Oral Daily  . azithromycin  500 mg Oral Daily  . cefTRIAXone (ROCEPHIN)  IV  1 g Intravenous Q24H  . levothyroxine  75 mcg Oral QAC  breakfast  . lubiprostone  24 mcg Oral BID  . metoprolol tartrate  50 mg Oral BID  . rosuvastatin  5 mg Oral QPM    Assessment/ Plan:   Principal Problem:   TIA (transient ischemic attack) Active Problems:   2 Vessel CAD - s/p CABG; LIMA-LAD, SVG-RCA   Chronic atrial fibrillation (HCC)   Hyperlipidemia with target LDL less than 70   Hypothyroidism   Ischemic cardiomyopathy   PAD (peripheral artery disease) (HCC)   S/P CABG x 2   Cough   History of stroke   Acute encephalopathy   Acute cystitis without hematuria   Aspiration pneumonia (HCC)  1. Atrial fib:   With RVR.  Her HR is intermittently fast then at other times is well controlled.  Currently on metoprolol 50 BID .  Continue same dose for now  On Eliquis 5 BID   echo shows mild - moderarate LV dysfunction.    Has moderate - severe pulmonary HTN - estimated PA pressure of 59.   2. Pulmonary HTN:     Disposition:  Length of Stay: 3  Vesta MixerPhilip J. Elio Haden, Montez HagemanJr., MD, [facility] CenterFACC 04/08/2015, 6:27 PM Office [phone] Pager  [phone]

## 2015-04-08 NOTE — Progress Notes (Signed)
04/08/15 1356  Clinical Encounter Type  Visited With Patient;Health care provider  Visit Type Initial  Referral From Patient  Spiritual Encounters  Spiritual Needs Literature;Other (Comment) (Advanced Directive)  Stress Factors  Patient Stress Factors Health changes   Chaplain met with patient, who is requesting an Advanced Directive. Patient has a lot going on health wise, and it seems like none of this was really communicated with her well prior to the hospitalization. Patient is wanting the daughter to be present with her to go through the Advanced Directive and to help her fill out some of the sections. Chaplain services are available to help facilitate the completion of this advanced directive. Chaplain offered support, and our support is available as needed.   Adam M Barnes, Chaplain 04/08/2015 2:00 PM  

## 2015-04-09 DIAGNOSIS — G9340 Encephalopathy, unspecified: Secondary | ICD-10-CM | POA: Diagnosis not present

## 2015-04-09 DIAGNOSIS — G458 Other transient cerebral ischemic attacks and related syndromes: Secondary | ICD-10-CM | POA: Diagnosis not present

## 2015-04-09 DIAGNOSIS — N3000 Acute cystitis without hematuria: Secondary | ICD-10-CM | POA: Diagnosis not present

## 2015-04-09 LAB — COMPREHENSIVE METABOLIC PANEL
ALT: 10 U/L — ABNORMAL LOW (ref 14–54)
ANION GAP: 8 (ref 5–15)
AST: 19 U/L (ref 15–41)
Albumin: 3.4 g/dL — ABNORMAL LOW (ref 3.5–5.0)
Alkaline Phosphatase: 57 U/L (ref 38–126)
BUN: 14 mg/dL (ref 6–20)
CHLORIDE: 104 mmol/L (ref 101–111)
CO2: 26 mmol/L (ref 22–32)
CREATININE: 0.87 mg/dL (ref 0.44–1.00)
Calcium: 8.7 mg/dL — ABNORMAL LOW (ref 8.9–10.3)
Glucose, Bld: 98 mg/dL (ref 65–99)
POTASSIUM: 4.1 mmol/L (ref 3.5–5.1)
Sodium: 138 mmol/L (ref 135–145)
Total Bilirubin: 0.8 mg/dL (ref 0.3–1.2)
Total Protein: 6 g/dL — ABNORMAL LOW (ref 6.5–8.1)

## 2015-04-09 LAB — CBC
HCT: 36.6 % (ref 36.0–46.0)
Hemoglobin: 10.2 g/dL — ABNORMAL LOW (ref 12.0–15.0)
MCH: 19.2 pg — AB (ref 26.0–34.0)
MCHC: 27.9 g/dL — AB (ref 30.0–36.0)
MCV: 68.8 fL — AB (ref 78.0–100.0)
PLATELETS: 175 10*3/uL (ref 150–400)
RBC: 5.32 MIL/uL — ABNORMAL HIGH (ref 3.87–5.11)
RDW: 17.5 % — AB (ref 11.5–15.5)
WBC: 7.3 10*3/uL (ref 4.0–10.5)

## 2015-04-09 MED ORDER — METOPROLOL TARTRATE 50 MG PO TABS: 50.0000 mg | ORAL_TABLET | Freq: Two times a day (BID) | ORAL | Status: DC

## 2015-04-09 MED ORDER — DIGOXIN 125 MCG PO TABS: 0.1250 mg | ORAL_TABLET | Freq: Every day | ORAL | Status: DC

## 2015-04-09 MED ORDER — AZITHROMYCIN 500 MG PO TABS
500.0000 mg | ORAL_TABLET | Freq: Every day | ORAL | Status: DC
Start: 2015-04-09 — End: 2015-04-22

## 2015-04-09 MED ORDER — CEFDINIR 300 MG PO CAPS: 300.0000 mg | ORAL_CAPSULE | Freq: Two times a day (BID) | ORAL | Status: DC

## 2015-04-09 MED ORDER — SENNOSIDES-DOCUSATE SODIUM 8.6-50 MG PO TABS: 1.0000 | ORAL_TABLET | Freq: Every evening | ORAL | Status: DC | PRN

## 2015-04-09 NOTE — Progress Notes (Signed)
OT Cancellation Note  Patient Details Name: Brenda RinksRobina B Glass MRN: [id number] DOB: 08/15/1940   Cancelled Treatment:    Reason Eval/Treat Not Completed: OT screened, no needs identified, will sign off. Agree with previous OT evaluation from (04/06/15).   Nils PyleJulia Steffani Dionisio, OTR/L Pager: [phone] 04/09/2015, 1:52 PM

## 2015-04-09 NOTE — Progress Notes (Signed)
Patient is being dc home. D/c instructions given to patient and care giver, both verbalized understanding.

## 2015-04-09 NOTE — Progress Notes (Signed)
Physical Therapy Treatment and Discharge Patient Details Name: [name] MRN: [id number] DOB: May 20, 1940 Today's Date: 04/09/2015    History of Present Illness 75 y.o. 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 In addition patient was noticed to have some dysarthria. CT of the head did not show anything acute and on exam patient appears nonfocal.  Patient has been having left upper back pain which patient states has been chronic. Patient's heart rate is mildly elevated and is in A. fib. Rate fluctuating 86 to 150 at rest.    PT Comments    At the time of PT eval pt was able to perform transfers and ambulation with modified independence. Pt and daughter were educated on positioning in the chair and use of pillows for scapular support when pt feels a spasm coming on. Pt has met her current acute care goals and is being discharged from acute physical therapy at this time. Please reconsult if needs change.   Follow Up Recommendations  No PT follow up     Equipment Recommendations  None recommended by PT    Recommendations for Other Services       Precautions / Restrictions Precautions Precautions: Fall;Back Precaution Booklet Issued: No Precaution Comments: Back precautions encouraged for comfort Restrictions Weight Bearing Restrictions: No    Mobility  Bed Mobility Overal bed mobility: Needs Assistance Bed Mobility: Supine to Sit     Supine to sit: Modified independent (Device/Increase time)     General bed mobility comments: Encouraged log roll for comfort however pt sat straight up and moved legs off bed without assist.   Transfers Overall transfer level: Modified independent Equipment used: None Transfers: Sit to/from Stand           General transfer comment: Pt demonstrated good hand placement on seated surface for safety. Pt did not  demonstrate any unsteadiness or LOB.   Ambulation/Gait Ambulation/Gait assistance: Modified independent (Device/Increase time) Ambulation Distance (Feet): 200 Feet Assistive device: None Gait Pattern/deviations: WFL(Within Functional Limits) Gait velocity: Decreased Gait velocity interpretation: Below normal speed for age/gender General Gait Details: No unsteadiness noted. Pt was able to ambulate well without AD. Held lower back at end of gait training - pt states for comfort however that is not the area of her reported spasms.   Stairs Stairs: Yes Stairs assistance: Modified independent (Device/Increase time) Stair Management: Two rails;Alternating pattern;Forwards Number of Stairs: 5 General stair comments: No assist required.   Wheelchair Mobility    Modified Rankin (Stroke Patients Only)       Balance Overall balance assessment: No apparent balance deficits (not formally assessed)                                  Cognition Arousal/Alertness: Awake/alert Behavior During Therapy: WFL for tasks assessed/performed Overall Cognitive Status: Within Functional Limits for tasks assessed                      Exercises      General Comments        Pertinent Vitals/Pain Pain Assessment: No/denies pain    Home Living                      Prior Function            PT Goals (current goals  can now be found in the care plan section) Acute Rehab PT Goals Patient Stated Goal: to get rid of her back pain PT Goal Formulation: With patient/family Time For Goal Achievement: 04/13/15 Potential to Achieve Goals: Good Progress towards PT goals: Goals met/education completed, patient discharged from PT    Frequency  Min 3X/week    PT Plan Current plan remains appropriate    Co-evaluation             End of Session Equipment Utilized During Treatment: Gait belt Activity Tolerance: Patient limited by pain Patient left: with call  bell/phone within reach;with family/visitor present;in chair     Time: 1798-1025 PT Time Calculation (min) (ACUTE ONLY): 20 min  Charges:  $Gait Training: 8-22 mins                    G Codes:      Rolinda Roan Apr 28, 2015, 12:14 PM   Rolinda Roan, PT, DPT Acute Rehabilitation Services Pager: [phone]

## 2015-04-09 NOTE — Discharge Summary (Signed)
Physician Discharge Summary  [name] MRN: [id number] DOB/AGE: 05/18/1940 75 y.o.  PCP: Sandi Mariscal, MD   Admit date: 04/05/2015 Discharge date: 04/09/2015  Discharge Diagnoses:    Principal Problem:   TIA (transient ischemic attack) Active Problems:   2 Vessel CAD - s/p CABG; LIMA-LAD, SVG-RCA   Chronic atrial fibrillation (HCC)   Hyperlipidemia with target LDL less than 70   Hypothyroidism   Ischemic cardiomyopathy   PAD (peripheral artery disease) (HCC)   S/P CABG x 2   Cough   History of stroke   Acute encephalopathy   Acute cystitis without hematuria   Aspiration pneumonia ([place])    Follow-up recommendations Follow-up with PCP in 3-5 days , including all  additional recommended appointments as below Follow-up CBC, CMP in 3-5 days Follow-up with cardiology as scheduled     Medication List    STOP taking these medications        diazepam 2 MG tablet  Commonly known as:  VALIUM      TAKE these medications        AMITIZA 24 MCG capsule  Generic drug:  lubiprostone  Take 24 mcg by mouth 2 (two) times daily.     apixaban 5 MG Tabs tablet  Commonly known as:  ELIQUIS  Take 1 tablet (5 mg total) by mouth 2 (two) times daily.     aspirin 81 MG EC tablet  Take 1 tablet (81 mg total) by mouth daily.     azithromycin 500 MG tablet  Commonly known as:  ZITHROMAX  Take 1 tablet (500 mg total) by mouth daily.     bisacodyl 10 MG suppository  Commonly known as:  DULCOLAX  Place 1 suppository (10 mg total) rectally daily as needed for moderate constipation.     cefdinir 300 MG capsule  Commonly known as:  OMNICEF  Take 1 capsule (300 mg total) by mouth 2 (two) times daily.     CRESTOR 5 MG tablet  Generic drug:  rosuvastatin  TAKE 1 TABLET BY MOUTH IN THE EVENING     cyclobenzaprine 10 MG tablet  Commonly known as:  FLEXERIL  Take 10 mg by mouth 3 (three) times daily as needed for muscle spasms.     digoxin 0.125 MG tablet  Commonly known as:  LANOXIN   Take 1 tablet (0.125 mg total) by mouth daily.  Start taking on:  04/15/2015     docusate sodium 100 MG capsule  Commonly known as:  COLACE  Take 1 capsule (100 mg total) by mouth every 12 (twelve) hours.     feeding supplement (ENSURE ENLIVE) Liqd  Take 237 mLs by mouth 2 (two) times daily between meals.     HYDROcodone-acetaminophen 5-325 MG tablet  Commonly known as:  NORCO/VICODIN  Take 1 tablet by mouth every 6 (six) hours as needed for moderate pain.     lactulose 10 GM/15ML solution  Commonly known as:  CHRONULAC  Take 15 mLs (10 g total) by mouth 2 (two) times daily as needed for mild constipation.     levothyroxine 75 MCG tablet  Commonly known as:  SYNTHROID, LEVOTHROID  Take 75 mcg by mouth daily before breakfast.     metoprolol 50 MG tablet  Commonly known as:  LOPRESSOR  Take 1 tablet (50 mg total) by mouth 2 (two) times daily.     senna-docusate 8.6-50 MG tablet  Commonly known as:  Senokot-S  Take 1 tablet by mouth at bedtime as needed for mild constipation.  Discharge Condition: Stable   Discharge Instructions       Discharge Instructions    Ambulatory referral to Neurology    Complete by:  As directed   Pt will follow up with Dr. Erlinda Hong at [facility] in about 3 months. Thanks.           No Known Allergies    Disposition: 06-Home-Health Care Svc   Consults: * Cardiology Neurology     Significant Diagnostic Studies:  Ct Head Wo Contrast  04/05/2015  CLINICAL DATA:  SLURRED SPEECH THIS AM PER FAMILY. PATIENT HAS NOT NOTICED SYMPTOMS. HX OF STROKE S/P CABG x 2. EXAM: CT HEAD WITHOUT CONTRAST TECHNIQUE: Contiguous axial images were obtained from the base of the skull through the vertex without intravenous contrast. COMPARISON:  11/18/2014 CT and MRI FINDINGS: Low-attenuation identified within the left frontal lobe, consistent with previous middle cerebral infarct, which has shown evolution since prior CT exam. There is periventricular white  matter change. Small lacunar infarct within the right basal ganglia is stable. No evidence for acute infarction or intra cerebral hemorrhage. No intra or extra-axial fluid collection or mass. Bone windows demonstrate significant atherosclerotic calcification of the internal carotid arteries. There is mild mucoperiosteal thickening of the ethmoid air cells. No acute calvarial abnormality identified. IMPRESSION: 1. Changes consistent with old left middle cerebral infarct. 2.  No evidence for acute intracranial abnormality. 3. Atrophy and small vessel disease. 4. Mild chronic sinusitis. Electronically Signed   By: Nolon Nations M.D.   On: 04/05/2015 19:15   [name] Contrast  04/06/2015  CLINICAL DATA:  Initial evaluation for transient slurred speech. EXAM: MRI HEAD WITHOUT CONTRAST TECHNIQUE: Multiplanar, multiecho pulse sequences of the brain and surrounding structures were obtained without intravenous contrast. COMPARISON:  Prior CT from 04/04/2014 as well as earlier studies. FINDINGS: Cerebral volume within normal limits for patient age. Mild patchy T2/FLAIR hyperintensity within the periventricular white matter, primarily within the periatrial white matter, most likely related to chronic small vessel ischemic disease, mild for age. Encephalomalacia within the right temporal occipital region consistent with remote ischemic infarct. There is persistent diffusion abnormality within the left MCA territory in the left frontal lobe in the region of the operculum. No corresponding signal loss seen on ADC map, consistent with late subacute infarct. Corresponding T2/FLAIR signal within this region without mass effect. Small amount of laminar necrosis. No associated hemorrhage. No other acute infarct identified. Normal intravascular flow voids preserved. Gray-white matter differentiation otherwise maintained. No mass lesion, midline shift or mass effect. No hydrocephalus. No extra-axial fluid collection.  Craniocervical junction within normal limits. ACDF partially visualized within the upper cervical spine. Pituitary gland normal. No acute abnormality about the orbits. Sequela prior bilateral lens extraction. Mild mucosal thickening within the left maxillary sinus. Paranasal sinuses otherwise largely clear. Trace opacity within the mastoid air cells. Inner ear structures grossly normal. Bone marrow signal intensity within normal limits. No scalp soft tissue abnormality. IMPRESSION: 1. No acute intracranial infarct or other process identified. 2. Late subacute left MCA territory infarct. 3. Additional chronic infarct within the posterior right temporoccipital region. 4. Mild chronic microvascular ischemic disease. Electronically Signed   By: Jeannine Boga M.D.   On: 04/06/2015 06:09   Dg Chest Port 1 View  04/06/2015  CLINICAL DATA:  Cough.  Stroke. EXAM: PORTABLE CHEST 1 VIEW COMPARISON:  11/18/2014 FINDINGS: Prior CABG.  Atherosclerotic aortic arch.  Mild cardiomegaly. Emphysema noted. Indistinct interstitial and faint confluent airspace opacity in the right upper lobe. Atelectasis  or scarring along the lingula. Bony demineralization noted along with dextroconvex thoracic scoliosis. IMPRESSION: 1. Interstitial and faint airspace opacity in the right upper lobe suspicious for incipient pneumonia or aspiration pneumonitis. 2. Atherosclerosis and cardiomegaly. 3. Bony demineralization. Electronically Signed   By: Van Clines M.D.   On: 04/06/2015 08:33      2-D echo LV EF: 50%  ------------------------------------------------------------------- Indications:   CVA 436.  ------------------------------------------------------------------- History:  PMH:  Atrial fibrillation. Coronary artery disease. PMH:  Stroke. Transient ischemic attack. Risk factors: Former tobacco use. Hypertension. Dyslipidemia.  ------------------------------------------------------------------- Study  Conclusions  - Left ventricle: The cavity size was normal. Systolic function was mildly to moderately reduced. The estimated ejection fraction was 50%. Wall motion was normal; there were no regional wall motion abnormalities. The study was not technically sufficient to allow evaluation of LV diastolic dysfunction due to atrial fibrillation. - Ventricular septum: Septal motion showed paradox. - Aortic valve: There was no regurgitation. - Aortic root: The aortic root was normal in size. - Mitral valve: Calcified annulus. Mildly thickened leaflets . Transvalvular velocity was within the normal range. There was no evidence for stenosis. - Left atrium: The atrium was moderately dilated. - Right ventricle: The cavity size was moderately dilated. Wall thickness was normal. Systolic function was moderately reduced. - Right atrium: The atrium was severely dilated. - Tricuspid valve: There was moderate regurgitation. - Pulmonic valve: Structurally normal valve. There was mild regurgitation. - Pulmonary arteries: Systolic pressure was severely increased. PA peak pressure: 59 mm Hg (S). - Inferior vena cava: The vessel was dilated. The respirophasic diameter changes were blunted (< 50%), consistent with elevated central venous pressure. - Pericardium, extracardiac: There was no pericardial effusion.    Filed Weights   04/05/15 1649 04/05/15 2122  Weight: 39.009 kg (86 lb) 38.7 kg (85 lb 5.1 oz)     Microbiology: No results found for this or any previous visit (from the past 240 hour(s)).     Blood Culture    Component Value Date/Time   SDES URINE, CLEAN CATCH 06/25/2012 2214   SPECREQUEST NONE 06/25/2012 2214   CULT NO GROWTH 06/25/2012 2214   REPTSTATUS 06/26/2012 FINAL 06/25/2012 2214      Labs: Results for orders placed or performed during the hospital encounter of 04/05/15 (from the past 48 hour(s))  CBC     Status: Abnormal   Collection Time:  04/08/15  4:10 AM  Result Value Ref Range   WBC 5.4 4.0 - 10.5 K/uL   RBC 4.92 3.87 - 5.11 MIL/uL   Hemoglobin 9.6 (L) 12.0 - 15.0 g/dL    Comment: CONSISTENT WITH PREVIOUS RESULT   HCT 34.4 (L) 36.0 - 46.0 %   MCV 69.9 (L) 78.0 - 100.0 fL   MCH 19.5 (L) 26.0 - 34.0 pg   MCHC 27.9 (L) 30.0 - 36.0 g/dL   RDW 17.4 (H) 11.5 - 15.5 %   Platelets 145 (L) 150 - 400 K/uL  Basic metabolic panel     Status: Abnormal   Collection Time: 04/08/15  4:10 AM  Result Value Ref Range   Sodium 142 135 - 145 mmol/L   Potassium 4.6 3.5 - 5.1 mmol/L   Chloride 105 101 - 111 mmol/L   CO2 28 22 - 32 mmol/L   Glucose, Bld 89 65 - 99 mg/dL   BUN 13 6 - 20 mg/dL   Creatinine, Ser 0.79 0.44 - 1.00 mg/dL   Calcium 8.8 (L) 8.9 - 10.3 mg/dL   GFR calc non  Af Amer >60 >60 mL/min   GFR calc Af Amer >60 >60 mL/min    Comment: (NOTE) The eGFR has been calculated using the CKD EPI equation. This calculation has not been validated in all clinical situations. eGFR's persistently <60 mL/min signify possible Chronic Kidney Disease.    Anion gap 9 5 - 15  Digoxin level     Status: Abnormal   Collection Time: 04/08/15 11:20 AM  Result Value Ref Range   Digoxin Level 2.7 (HH) 0.8 - 2.0 ng/mL    Comment: CRITICAL RESULT CALLED TO, READ BACK BY AND VERIFIED WITH: D.JOHNSON,RN 1248 04/08/15 CLARK,S   CBC     Status: Abnormal   Collection Time: 04/09/15  3:30 AM  Result Value Ref Range   WBC 7.3 4.0 - 10.5 K/uL   RBC 5.32 (H) 3.87 - 5.11 MIL/uL   Hemoglobin 10.2 (L) 12.0 - 15.0 g/dL   HCT 36.6 36.0 - 46.0 %   MCV 68.8 (L) 78.0 - 100.0 fL   MCH 19.2 (L) 26.0 - 34.0 pg   MCHC 27.9 (L) 30.0 - 36.0 g/dL   RDW 17.5 (H) 11.5 - 15.5 %   Platelets 175 150 - 400 K/uL    Comment: SPECIMEN CHECKED FOR CLOTS REPEATED TO VERIFY CONSISTENT WITH PREVIOUS RESULT   Comprehensive metabolic panel     Status: Abnormal   Collection Time: 04/09/15  3:30 AM  Result Value Ref Range   Sodium 138 135 - 145 mmol/L   Potassium 4.1  3.5 - 5.1 mmol/L   Chloride 104 101 - 111 mmol/L   CO2 26 22 - 32 mmol/L   Glucose, Bld 98 65 - 99 mg/dL   BUN 14 6 - 20 mg/dL   Creatinine, Ser 0.87 0.44 - 1.00 mg/dL   Calcium 8.7 (L) 8.9 - 10.3 mg/dL   Total Protein 6.0 (L) 6.5 - 8.1 g/dL   Albumin 3.4 (L) 3.5 - 5.0 g/dL   AST 19 15 - 41 U/L   ALT 10 (L) 14 - 54 U/L   Alkaline Phosphatase 57 38 - 126 U/L   Total Bilirubin 0.8 0.3 - 1.2 mg/dL   GFR calc non Af Amer >60 >60 mL/min   GFR calc Af Amer >60 >60 mL/min    Comment: (NOTE) The eGFR has been calculated using the CKD EPI equation. This calculation has not been validated in all clinical situations. eGFR's persistently <60 mL/min signify possible Chronic Kidney Disease.    Anion gap 8 5 - 15     Lipid Panel     Component Value Date/Time   CHOL 123 04/06/2015 0636   TRIG 64 04/06/2015 0636   HDL 63 04/06/2015 0636   CHOLHDL 2.0 04/06/2015 0636   VLDL 13 04/06/2015 0636   LDLCALC 47 04/06/2015 0636     Lab Results  Component Value Date   HGBA1C 5.9* 04/06/2015   HGBA1C 5.5 11/17/2014     Lab Results  Component Value Date   LDLCALC 47 04/06/2015   CREATININE 0.87 04/09/2015     HPI :History of present illness [name] is an 75 y.o. female with a past medical history significant for HTN, hyperlipidemia, CABG in 2009 (LIMA-LAD, SVG-RCA), ischemic cardiomyopathy with prior EF of 40% (now 50%), chronic atrial fibrillation, ischemic cardiomyopathy, PAF on apixaban (previously treated with xarelto and pradaxa), left MCA embolic infarct 63/14, and PAD, brought in by EMS due to acute onset of difficulty walking. Patient daughter said that for few hours patient was "  too sleepy, no speaking well, and off balance". She presented to [facility] ED on 04/05/15 with unsteady gait and speech difficulty. She was in atrial fibrillation w/ a RVR around 115-120 bpm but reported full compliance with Eliquis. CT of the head showed no acute abnormality. MRI showed subacute left MCA  infarct, no acute infarct. It is felt that she suffered a TIA. Her symptoms have resolved. Neurology is following and she has been continued on Eliquis. Cardiology was consulted for afib management/ rate control.Per records, she was on amiodarone in the past but this was apparently discontinued during her last hospital stay due to adverse effects. She has since been treated with digoxin + metoprolol. Her digoxin level at time of admit was subthreapeutic  Assessment and plan 1. TIA - neurology is following. At this time patient appears nonfocal. MRI brain shows subacute left MCA infarct, no acute infarct. Patient is on Apixaban which will be continued. MRI no acute infarct, carotid Doppler unremarkable, 2-D echo EF 50% no cardiac source of emboli other than known  atrial fibrillation. LDL 47, hemoglobin A1c 5.9, no PT follow-up needed. Neurology advises to avoid hypotension and precaution with blood pressure medications and the setting of left MCA occlusion.follow up with Dr. Erlinda Hong at [facility] in about 3 months   2. A. fib with RVR, followed by Dr. Ellyn Hack, her amiodarone was stopped during her last hospital stay for adverse effects.Held digoxin 0.25 mg due to supratherapeutic levels, can resume 0.125 mg next week and metoprolol increased by cardiology to 50 mg BID for rate control . Heart rate remains more or less controlled.Marland KitchenAppreciate cardiology input. Chads 2 vasc score is 7. Continue Apixaban.    3. CAD status post CABG - has some left upper back pain. Cardiac enzymes negative 4. Hypothyroidism - TSH within normal limits 5. Ischemic cardiomyopathy last EF was around 55% in August 2016 - appears compensated. 6. Hyperlipidemia: statins. 7. UTI/CAP- patient started on Rocephin plus azithromycin,, received 4 days of antibiotics, now switched to azithromycin and Omnicef to complete a total of 7 days, SLP evaluation for recent suspected subacute CVA reassuring   Discharge Exam:    Blood pressure 101/61,  pulse 96, temperature 98.6 F (37 C), temperature source Oral, resp. rate 18, height 5' (1.524 m), weight 38.7 kg (85 lb 5.1 oz), SpO2 96 %.   General: Vital signs reviewed and noted.   Head: Normocephalic, atraumatic.  Eyes: conjunctivae/corneas clear. EOM's intact.   Throat: normal  Neck: normal  Lungs:   clear   Heart: Irreg. Irreg.   Abdomen:  Soft, non-tender, non-distended   Extremities: No edema   Neurologic: A&O X3, CN II - XII are grossly intact.   Psych: Normal            Follow-up Information    Follow up with Xu,Jindong, MD. Schedule an appointment as soon as possible for a visit in 3 months.   Specialty:  Neurology   Why:  stroke clinic   Contact information:   [address] [place] [address] [phone]       Follow up with Sandi Mariscal, MD. Schedule an appointment as soon as possible for a visit in 3 days.   Specialty:  Internal Medicine   Contact information:   [address] [address] [phone]       Signed: Reyne Dumas 04/09/2015, 9:35 AM        Time spent >45 mins

## 2015-04-09 NOTE — Care Management Note (Signed)
Case Management Note  Patient Details  Name: [name] MRN: [id number] Date of Birth: 04/24/1940  Subjective/Objective:                    Action/Plan: Patient discharging home today [place] care with her family. No further needs per CM.   Expected Discharge Date:  02/09/15               Expected Discharge Plan:  Home/[facility]  In-House Referral:     Discharge planning Services  CM Consult  Post Acute Care Choice:    Choice offered to:     DME Arranged:    DME Agency:     HH Arranged:    HH Agency:     Status of Service:  Completed, signed off  Medicare Important Message Given:    Date Medicare IM Given:    Medicare IM give by:    Date Additional Medicare IM Given:    Additional Medicare Important Message give by:     If discussed at Long Length of Stay Meetings, dates discussed:    Additional Comments:  Kermit BaloKelli F Isais Klipfel, RN 04/09/2015, 11:57 AM

## 2015-04-22 ENCOUNTER — Ambulatory Visit (INDEPENDENT_AMBULATORY_CARE_PROVIDER_SITE_OTHER): Payer: Medicare HMO | Admitting: Physician Assistant

## 2015-04-22 ENCOUNTER — Encounter: Payer: Self-pay | Admitting: Physician Assistant

## 2015-04-22 VITALS — BP 102/80 | HR 73 | Ht 60.0 in | Wt 85.8 lb

## 2015-04-22 DIAGNOSIS — I4819 Other persistent atrial fibrillation: Secondary | ICD-10-CM

## 2015-04-22 DIAGNOSIS — I4891 Unspecified atrial fibrillation: Secondary | ICD-10-CM

## 2015-04-22 DIAGNOSIS — I481 Persistent atrial fibrillation: Secondary | ICD-10-CM

## 2015-04-22 DIAGNOSIS — Z7901 Long term (current) use of anticoagulants: Secondary | ICD-10-CM | POA: Diagnosis not present

## 2015-04-22 DIAGNOSIS — I25810 Atherosclerosis of coronary artery bypass graft(s) without angina pectoris: Secondary | ICD-10-CM | POA: Diagnosis not present

## 2015-04-22 NOTE — Progress Notes (Signed)
Cardiology Office Note   Date:  04/22/2015   ID:  [name], DOB [birth year], MRN [id number]  PCP:  [name], MD  Cardiologist:  Dr Lauraine Rinne, PA-C   Chief Complaint  Patient presents with  . Follow-up    2 week post hospital//AFIB//pt c/o mild SOB, no other Sx.    History of Present Illness: [name] is a 75 y.o. female with a history of bypass surgery in 2009, ICM with EF 40-45 percent, persistent atrial fibrillation on Eliquis, HTN, HL, COPD, PAD, Hypothyroid, CVA.  D/c 04/09/2015 after admission for TIA.  [name] presents for post hospital follow-up.  Since discharge from the hospital, she has done well. She denies shortness of breath at rest, lower extremity edema, orthopnea or PND. She feels that she is able to do what she wants to do without getting short of breath. She has not had chest pain. She does not have palpitations and has no awareness of the atrial fibrillation. She is not aware of any weakness or problems that might indicate another TIA. She feels she is doing well.   Past Medical History  Diagnosis Date  . S/P CABG x 2 2009    LIMA-LAD, SVG-RCA, with AV fistula ligation and Maze procedure  . CAD (coronary artery disease), native coronary artery 2009    75% LAD, diffuse 75-90% RCA --> Referred for CABG + MAZE;  Cardiolite 12/2013: No ischemia or Infarction  . Ischemic cardiomyopathy 2012    EF ~40-45% by Echo  . Persistent atrial fibrillation (HCC) 04/05/2012    PAF, s/p MAZE -- recurrence, cardioversion-converted to sinus bradycardia --> Now persistent  . Essential hypertension   . Dyslipidemia, goal LDL below 70     On Crestor, followed by PCP  . COPD (chronic obstructive pulmonary disease) (HCC)   . PAD (peripheral artery disease) (HCC) 2007    s/p L Ileac A stent ; most recent Dopplers July 2012: Less than 50% reduction bilaterally. ABI 0.96 on the right 0.88 on left;;;12/27/2011   -ABI right .87 and left ABI .78  ,LEFT CIA and  EIA stent normall patency, left CFA,SFA,and popliteal 0-49%; rgt proximal SFA 50-69%,rgt CIA,EIA, and CFA 0-49%  . Chronic low back pain     scoliosis & lordosis  . H/O: pneumonia   . Hypothyroidism   . Anxiety   . Cervical neck pain with evidence of disc disease     with need for surgery -- November 2015  . Osteoarthritis of back     And neck, hands,spine  . H/O ischemic left MCA stroke 11/18/2014    MRI/MRA of head: Multifocal left MCA infarct.. Also possible left ACA territory infarct -- complete occlusion of left MCA distally at M2 level. Marked left ACA attenuation.    Past Surgical History  Procedure Laterality Date  . Iliac artery stent Left 04/06/2005    Ex Iliac - CFA (Smart STENTS -- 7x4 in EIA, 6 x 3 CFA)   . Coronary artery bypass graft  2009    LIMA-LAD, SVG-RCA (also MAZE & AV fistula ligation)  . Maze  2009    along with CABG  . Cardioversion  04/05/2012    Procedure: CARDIOVERSION;  Surgeon: Thurmon Fair, MD;  Location: [facility] ENDOSCOPY;  Service: Cardiovascular;  Laterality: N/A;  . Transthoracic echocardiogram  Jan 2012    EF ~40-45%, global HK; PAP ~45-50 mmHg  . Nm myoview ltd  May 2013    No ischemia or Infarct  .  Abdominal hysterectomy    . Cardiac catheterization  07/30/2007    75% LAD ,3 stenoses of 75-90% in  RCA;circumflex from proximal RCA with no lesion seen,normal ramus intermediate branh; normal LV systoilc function  . Nm cardiolite ltd  12/2013    Non-gated for Afib; No Ischemia or Infarction.  Marland Kitchen Anterior cervical decomp/discectomy fusion N/A 01/23/2014    Procedure: ANTERIOR CERVICAL DECOMPRESSION/DISCECTOMY FUSION CERVICAL FOUR-FIVE,CERVICAL FIVE-SIX,CERVICAL SIX-SEVEN;  Surgeon: Mariam Dollar, MD;  Location: [facility] NEURO ORS;  Service: Neurosurgery;  Laterality: N/A;  . Colonoscopy with propofol N/A 11/12/2014    Procedure: COLONOSCOPY WITH PROPOFOL;  Surgeon: Charna Elizabeth, MD;  Location: [facility] ENDOSCOPY;  Service: Endoscopy;  Laterality: N/A;    Current  Outpatient Prescriptions  Medication Sig Dispense Refill  . AMITIZA 24 MCG capsule Take 24 mcg by mouth 2 (two) times daily.    Marland Kitchen apixaban (ELIQUIS) 5 MG TABS tablet Take 1 tablet (5 mg total) by mouth 2 (two) times daily. 60 tablet 0  . aspirin EC 81 MG EC tablet Take 1 tablet (81 mg total) by mouth daily. 30 tablet 0  . bisacodyl (DULCOLAX) 10 MG suppository Place 1 suppository (10 mg total) rectally daily as needed for moderate constipation. 12 suppository 0  . CRESTOR 5 MG tablet TAKE 1 TABLET BY MOUTH IN THE EVENING 30 tablet 8  . cyclobenzaprine (FLEXERIL) 10 MG tablet Take 10 mg by mouth 3 (three) times daily as needed for muscle spasms.    . digoxin (LANOXIN) 0.125 MG tablet Take 1 tablet (0.125 mg total) by mouth daily. 30 tablet 6  . docusate sodium (COLACE) 100 MG capsule Take 1 capsule (100 mg total) by mouth every 12 (twelve) hours. 60 capsule 0  . HYDROcodone-acetaminophen (NORCO/VICODIN) 5-325 MG per tablet Take 1 tablet by mouth every 6 (six) hours as needed for moderate pain. 30 tablet 0  . lactulose (CHRONULAC) 10 GM/15ML solution Take 15 mLs (10 g total) by mouth 2 (two) times daily as needed for mild constipation. 240 mL 0  . levothyroxine (SYNTHROID, LEVOTHROID) 75 MCG tablet Take 75 mcg by mouth daily before breakfast.    . metoprolol (LOPRESSOR) 50 MG tablet Take 1 tablet (50 mg total) by mouth 2 (two) times daily. 60 tablet 2  . senna-docusate (SENOKOT-S) 8.6-50 MG tablet Take 1 tablet by mouth at bedtime as needed for mild constipation. 30 tablet 0   No current facility-administered medications for this visit.    Allergies:   Review of patient's allergies indicates no known allergies.    Social History:  The patient  reports that she quit smoking about 17 years ago. Her smoking use included Cigarettes. She smoked 0.50 packs per day. She has never used smokeless tobacco. She reports that she does not drink alcohol or use illicit drugs.   Family History:  The patient's  family history includes Heart attack in her mother; Heart disease (age of onset: 51) in her brother; Stroke in her father.    ROS:  Please see the history of present illness. All other systems are reviewed and negative.    PHYSICAL EXAM: VS:  BP 102/80 mmHg  Pulse 73  Ht 5' (1.524 m)  Wt 85 lb 12.8 oz (38.919 kg)  BMI 16.76 kg/m2 , BMI Body mass index is 16.76 kg/(m^2). GEN: Well nourished, well developed, petite, elderly female in no acute distress HEENT: normal for age  Neck: no JVD, no carotid bruit, no masses Cardiac: Regular rate and rhythm; no murmur, no rubs,  or gallops Respiratory:  clear to auscultation bilaterally, normal work of breathing GI: soft, nontender, nondistended, + BS MS: no deformity or atrophy; no edema; distal pulses are 2+ in all 4 extremities  Skin: warm and dry, no rash Neuro:  Strength and sensation are intact Psych: euthymic mood, full affect   EKG:  EKG is ordered today. The ekg ordered today demonstrates atrial fibrillation, controlled ventricular response rate 73, right axis deviation, no acute ischemic changes.   Recent Labs: 05/02/2014: B Natriuretic Peptide 344.0* 04/06/2015: TSH 1.529 04/07/2015: Magnesium 2.0 04/09/2015: ALT 10*; BUN 14; Creatinine, Ser 0.87; Hemoglobin 10.2*; Platelets 175; Potassium 4.1; Sodium 138    Lipid Panel    Component Value Date/Time   CHOL 123 04/06/2015 0636   TRIG 64 04/06/2015 0636   HDL 63 04/06/2015 0636   CHOLHDL 2.0 04/06/2015 0636   VLDL 13 04/06/2015 0636   LDLCALC 47 04/06/2015 0636     Wt Readings from Last 3 Encounters:  04/22/15 85 lb 12.8 oz (38.919 kg)  04/05/15 85 lb 5.1 oz (38.7 kg)  03/31/15 86 lb 9.6 oz (39.282 kg)     Other studies Reviewed: Additional studies/ records that were reviewed today include: Hospital records and previous office notes.  ASSESSMENT AND PLAN:  1.  Atrial fibrillation: Rate is controlled today. Her digoxin dose is unclear as she has 2 different bottles of  digoxin with her today, but she will clarify this and call us. She is to continue current medications.  2. CAD: She is not having any ongoing ischemic symptoms. She is able to do what she wants to do, her major limitation is musculoskeletal pain.  3. Chronic anticoagulation: She is on a PICC [place] and compliant with the. Her daughter does her medications. She has not missed any doses and is not having any bleeding issues.  4. TIA: Her symptoms improved and she does not feel she is having any sequela from this.   Current medicines are reviewed at length with the patient today.  The patient does not have concerns regarding medicines.  The following changes have been made:  no change  Labs/ tests ordered today include:   Orders Placed This Encounter  Procedures  . EKG 12-Lead     Disposition:   FU with Dr. Herbie Baltimore  Signed, Kylan Veach, Deneen Harts  04/22/2015 1:25 PM    [facility] [address] [place], [place], [place]  [address] Phone: [phone]; Fax: [phone]

## 2015-04-22 NOTE — Patient Instructions (Signed)
Medication Instructions:  Your physician recommends that you continue on your current medications as directed. Please refer to the Current Medication list given to you today.  **Call our office once you have verified you Digoxin dose**  Labwork: none  Testing/Procedures: none  Follow-Up: Your physician recommends that you schedule a follow-up appointment in: 3 months with Dr. Herbie Baltimore    Any Other Special Instructions Will Be Listed Below (If Applicable).  Your physician recommends you try to drink Ensure daily to help provide you with the needed daily vitamins.   If you need a refill on your cardiac medications before your next appointment, please call your pharmacy.

## 2015-05-11 ENCOUNTER — Ambulatory Visit (INDEPENDENT_AMBULATORY_CARE_PROVIDER_SITE_OTHER): Payer: Medicare HMO | Admitting: Cardiology

## 2015-05-11 ENCOUNTER — Encounter: Payer: Self-pay | Admitting: Cardiology

## 2015-05-11 VITALS — BP 112/84 | HR 82 | Ht 60.0 in | Wt 84.3 lb

## 2015-05-11 DIAGNOSIS — I2510 Atherosclerotic heart disease of native coronary artery without angina pectoris: Secondary | ICD-10-CM

## 2015-05-11 DIAGNOSIS — I639 Cerebral infarction, unspecified: Secondary | ICD-10-CM | POA: Diagnosis not present

## 2015-05-11 DIAGNOSIS — I482 Chronic atrial fibrillation, unspecified: Secondary | ICD-10-CM

## 2015-05-11 DIAGNOSIS — I10 Essential (primary) hypertension: Secondary | ICD-10-CM | POA: Diagnosis not present

## 2015-05-11 DIAGNOSIS — I272 Other secondary pulmonary hypertension: Secondary | ICD-10-CM | POA: Diagnosis not present

## 2015-05-11 DIAGNOSIS — I255 Ischemic cardiomyopathy: Secondary | ICD-10-CM

## 2015-05-11 DIAGNOSIS — G458 Other transient cerebral ischemic attacks and related syndromes: Secondary | ICD-10-CM | POA: Diagnosis not present

## 2015-05-11 DIAGNOSIS — E785 Hyperlipidemia, unspecified: Secondary | ICD-10-CM

## 2015-05-11 DIAGNOSIS — R634 Abnormal weight loss: Secondary | ICD-10-CM

## 2015-05-11 DIAGNOSIS — I739 Peripheral vascular disease, unspecified: Secondary | ICD-10-CM

## 2015-05-11 NOTE — Patient Instructions (Signed)
NO CHANGE IN CURRENT MEDICATIONS   Your physician wants you to follow-up in 3 MONTHS WITH DR [facility].-- 30 MIN  You will receive a reminder letter in the mail two months in advance. If you don't receive a letter, please call our office to schedule the follow-up appointment.  If you need a refill on your cardiac medications before your next appointment, please call your pharmacy.

## 2015-05-11 NOTE — Progress Notes (Signed)
PCP: [name], MD  Clinic Note: Chief Complaint  Patient presents with  . Follow-up    4-6 weeks//AFIB//pt c/o SOB on exertion, no other Sx.  . Atrial Fibrillation  . Coronary Artery Disease  . Cerebrovascular Accident    TIA    HPI: [name] Hem is a 75 y.o. female with a PMH below who presents today for 1 month f/u - CAD/CABG, mild ICM (EF 40-45%), Chronic Afib, CVA, PAD & now basically failure to thrive..  I have actually seen her on December 28. She was in A. fib RVR (it was noted that her amiodarone had been stopped during the prior hospitalization for stroke in August 2016). She had been converted from Pradaxa then to Xarelto and then finally for ELIQUIS. -- It would appear that the original stroke was during a time when she was off of her anticoagulation for GI procedure. -- When I saw her, I increased her metoprolol to 50 mg the evening than 20 5 in the morning. With initial 25 mg for rapid rates. She also took full dose digoxin. --> She return for follow-up EKG on December 30 and was increased to metoprolol 50 mg twice a day. Unfortunately on January 2 she was admitted for what looked like TIA.  [name] was last seen on Apr 21, 2005 by Theodore Demark, PA-C-C--> TIA/CVA (subacute L MCA CVA)-- -- She is no longer taking digoxin (her doses were confusing, therefore she simply did not continue) - digoxin level was elevated at 2.7.  Rate was well controlled, her symptoms were doing well. No residual TIA/CVA symptoms.  Recent Hospitalizations: Jan 2016 - TIA/CVA  Studies Reviewed:   Echo  04/06/2015: Mild-moderately reduced EF roughly 50%. No RWMA, paradoxical septal motion. Cannot determine diastolic function due to A. fib. Moderate LA dilation. Elevated PAP ~ 59 mmHg with dilated IVC and blunted compression. Moderate TR and severely dilated RA   Interval History: Interestingly, [name] presents today much more vocal and responsive then she was during her last visit. Her  rate is much improved. This is despite the fact that she is on less medication. She is actually much clearer today and answers all of her questions. Her daughter is not with her. She is answering questions appropriately with no slurring speech and no pausing. She is again unaware of her heart rate being in the rapid or irregular. He is not noting any exertion or resting chest discomfort/pressure. No resting or exertional dyspnea.  No PND, orthopnea or edema.  No palpitations, lightheadedness, dizziness, weakness or syncope/near syncope. No further TIA/amaurosis fugax symptoms. No melena, hematochezia, hematuria, or epstaxis. No claudication.  ROS: A comprehensive was performed. Review of Systems  Constitutional: Negative for weight loss (Just having a hard time keeping weight on.).  HENT: Positive for hearing loss.   Eyes: Negative for blurred vision and double vision.  Respiratory: Negative for cough and shortness of breath.   Gastrointestinal: Negative for heartburn and abdominal pain.  Genitourinary: Negative for dysuria.  Musculoskeletal: Positive for joint pain (Stable) and neck pain. Negative for myalgias.  Neurological: Negative for dizziness and headaches.       No recurrent TIA symptoms  Endo/Heme/Allergies: Does not bruise/bleed easily.  Psychiatric/Behavioral: Negative for depression. The patient is not nervous/anxious (she does have spells of anxiety).   All other systems reviewed and are negative.    Past Medical History  Diagnosis Date  . S/P CABG x 2 2009    LIMA-LAD, SVG-RCA, with AV fistula ligation  and Maze procedure  . CAD (coronary artery disease), native coronary artery 2009    75% LAD, diffuse 75-90% RCA --> Referred for CABG + MAZE;  Cardiolite 12/2013: No ischemia or Infarction  . Ischemic cardiomyopathy 2012    EF ~40-45% by Echo  . Persistent atrial fibrillation (HCC) 04/05/2012    Now permanent A. fib s/p MAZE -- recurrence, cardioversion-converted to sinus  bradycardia --> Now persistent  . Essential hypertension   . Dyslipidemia, goal LDL below 70     On Crestor, followed by PCP  . COPD (chronic obstructive pulmonary disease) (HCC)   . PAD (peripheral artery disease) (HCC) 2007    s/p L Ileac A stent ; most recent Dopplers July 2012: Less than 50% reduction bilaterally. ABI 0.96 on the right 0.88 on left;;;12/27/2011   -ABI right .87 and left ABI .78  ,LEFT CIA and EIA stent normall patency, left CFA,SFA,and popliteal 0-49%; rgt proximal SFA 50-69%,rgt CIA,EIA, and CFA 0-49%  . Chronic low back pain     scoliosis & lordosis  . H/O: pneumonia   . Hypothyroidism   . Anxiety   . Cervical neck pain with evidence of disc disease     with need for surgery -- November 2015  . Osteoarthritis of back     And neck, hands,spine  . H/O ischemic left MCA stroke 11/18/2014    MRI/MRA of head: Multifocal left MCA infarct.. Also possible left ACA territory infarct -- complete occlusion of left MCA distally at M2 level. Marked left ACA attenuation.  Marland Kitchen TIA (transient ischemic attack) 04/05/2015  . Pulmonary hypertension (HCC)     Estimated PA pressure on Echo January 2017 = 59 mmHg with dilated IVC, severely dilated RA and moderate TR.    Past Surgical History  Procedure Laterality Date  . Iliac artery stent Left 04/06/2005    Ex Iliac - CFA (Smart STENTS -- 7x4 in EIA, 6 x 3 CFA)   . Coronary artery bypass graft  2009    LIMA-LAD, SVG-RCA (also MAZE & AV fistula ligation)  . Maze  2009    along with CABG  . Cardioversion  04/05/2012    Procedure: CARDIOVERSION;  Surgeon: Thurmon Fair, MD;  Location: [facility] ENDOSCOPY;  Service: Cardiovascular;  Laterality: N/A;  . Transthoracic echocardiogram  Jan 2012    EF ~40-45%, global HK; PAP ~45-50 mmHg  . Nm myoview ltd  May 2013    No ischemia or Infarct  . Abdominal hysterectomy    . Cardiac catheterization  07/30/2007    75% LAD ,3 stenoses of 75-90% in  RCA;circumflex from proximal RCA with no lesion  seen,normal ramus intermediate branh; normal LV systoilc function  . Nm cardiolite ltd  12/2013    Non-gated for Afib; No Ischemia or Infarction.  Marland Kitchen Anterior cervical decomp/discectomy fusion N/A 01/23/2014    Procedure: ANTERIOR CERVICAL DECOMPRESSION/DISCECTOMY FUSION CERVICAL FOUR-FIVE,CERVICAL FIVE-SIX,CERVICAL SIX-SEVEN;  Surgeon: Mariam Dollar, MD;  Location: [facility] NEURO ORS;  Service: Neurosurgery;  Laterality: N/A;  . Colonoscopy with propofol N/A 11/12/2014    Procedure: COLONOSCOPY WITH PROPOFOL;  Surgeon: Charna Elizabeth, MD;  Location: [facility] ENDOSCOPY;  Service: Endoscopy;  Laterality: N/A;   Prior to Admission medications   Medication Sig Start Date End Date Taking? Authorizing Provider  AMITIZA 24 MCG capsule Take 24 mcg by mouth 2 (two) times daily. 03/17/15  Yes Historical Provider, MD  apixaban (ELIQUIS) 5 MG TABS tablet Take 1 tablet (5 mg total) by mouth 2 (two) times daily. 11/19/14  Yes Joseph Art, DO  aspirin EC 81 MG EC tablet Take 1 tablet (81 mg total) by mouth daily. 11/19/14  Yes Joseph Art, DO  bisacodyl (DULCOLAX) 10 MG suppository Place 1 suppository (10 mg total) rectally daily as needed for moderate constipation. 11/19/14  Yes Joseph Art, DO  CRESTOR 5 MG tablet TAKE 1 TABLET BY MOUTH IN THE EVENING 10/19/14  Yes Marykay Lex, MD  cyclobenzaprine (FLEXERIL) 10 MG tablet Take 10 mg by mouth 3 (three) times daily as needed for muscle spasms.   Yes Historical Provider, MD  docusate sodium (COLACE) 100 MG capsule Take 1 capsule (100 mg total) by mouth every 12 (twelve) hours. 06/13/14  Yes Derwood Kaplan, MD  lactulose (CHRONULAC) 10 GM/15ML solution Take 15 mLs (10 g total) by mouth 2 (two) times daily as needed for mild constipation. 11/19/14  Yes Joseph Art, DO  levothyroxine (SYNTHROID, LEVOTHROID) 75 MCG tablet Take 75 mcg by mouth daily before breakfast.   Yes Historical Provider, MD  metoprolol (LOPRESSOR) 50 MG tablet Take 1 tablet (50 mg total) by mouth 2 (two)  times daily. 04/09/15  Yes Richarda Overlie, MD  senna-docusate (SENOKOT-S) 8.6-50 MG tablet Take 1 tablet by mouth at bedtime as needed for mild constipation. 04/09/15  Yes Richarda Overlie, MD   No Known Allergies   Social History   Social History  . Marital Status: Widowed    Spouse Name: N/A  . Number of Children: N/A  . Years of Education: N/A   Social History Main Topics  . Smoking status: Former Smoker -- 0.50 packs/day    Types: Cigarettes    Quit date: 01/07/1998  . Smokeless tobacco: Never Used  . Alcohol Use: No  . Drug Use: No  . Sexual Activity: Not Asked   Other Topics Concern  . None   Social History Narrative    She is a divorced mother of 1 and one child who died. Her daughter is here with her   today. She has got 4 grandchildren. She is a native of [place] and only has an occasional alcoholic   beverage. She does not get routine exercise mostly because of her spine pain. She did previously smoke but quit many years ago.    Family History  Problem Relation Age of Onset  . Heart attack Mother   . Stroke Father   . Heart disease Brother 60    Wt Readings from Last 3 Encounters:  05/11/15 84 lb 4.8 oz (38.238 kg)  04/22/15 85 lb 12.8 oz (38.919 kg)  04/05/15 85 lb 5.1 oz (38.7 kg)    PHYSICAL EXAM BP 112/84 mmHg  Pulse 82  Ht 5' (1.524 m)  Wt 84 lb 4.8 oz (38.238 kg)  BMI 16.46 kg/m2 General appearance: alert, cooperative, appears stated age, no distress; pleasant mood and affect. Thin and frail. However not chronically ill-appearing Neck: no adenopathy, no carotid bruit and no JVD Lungs: CTA B with mild interstitial sounds., normal percussion bilaterally and non-labored Heart: Irregularly irregular rate/rhythm;, S1 and S2 normal, no murmur, click, rub or gallop ; nondisplaced PMI, but palpable due to how thin she is. Abdomen: soft, non-tender; bowel sounds normal; no masses,  no organomegaly; no HJR Extremities: extremities normal, atraumatic, no cyanosis,  or edema  Pulses: 2+ and symmetric; mildly diminished pedal pulses Skin: no edema and no lesions noted; paperthin skin Neurologic: Mental status: Alert, oriented, thought content appropriate -- answers questions appropriately   Cranial nerves: normal (II-XII  grossly intact)    Adult ECG Report  Rate: 82 ;  Rhythm: atrial fibrillation and Indeterminate right upper quadrant access. Cannot exclude septal infarct, age undetermined.;   Narrative Interpretation: Besides the axis, no significant change. EKG did not appear different, but computer generated axis is different.   Other studies Reviewed: Additional studies/ records that were reviewed today include:  Recent Labs:    Lab Results  Component Value Date   CREATININE 0.87 04/09/2015   Lab Results  Component Value Date   K 4.1 04/09/2015   Lab Results  Component Value Date   CHOL 123 04/06/2015   HDL 63 04/06/2015   LDLCALC 47 04/06/2015   TRIG 64 04/06/2015   CHOLHDL 2.0 04/06/2015    ASSESSMENT / PLAN: Problem List Items Addressed This Visit    Weight loss (Chronic)    Overall, I think this continues to be a concerning thing for her. She needs to supplement her meals. We discussed adding supplementation such as boost or ensure her even [employer]. She should supplement each meal with a shake or smoothie as well as have an additional one or 2 shake/movies for snacks. She needs to increase her protein intake.  I fear that if she continues this direction she will be showing more signs of failure to thrive.      TIA (transient ischemic attack)    Recent episode in January was thought to be related to A. fib RVR and hypotension. No new acute findings on MRI.      Relevant Orders   EKG 12-Lead   Stroke with cerebral ischemia (HCC) - Primary (Chronic)    In the setting of A. fib with held anticoagulation. She has chronic A. fib that is rate controlled. Now on ELIQUIS for anticoagulation.      Pulmonary  hypertension (HCC)    Elevated PA pressure by echo. I'm not sure how much of a component the left-sided diastolic pressures are playing. I have significant pulmonary history but was a former smoker.  We will continue to monitor. She has had some readings in the high 40s, this is the first one in the 50s. If she does start having symptoms of dyspnea, may need to consider more detailed evaluation and potential treatment.      PAD (peripheral artery disease) (HCC) (Chronic)    No active claudication symptoms. She is on aspirin and statin.      Ischemic cardiomyopathy (Chronic)    Essentially, low normal EF. Probably made worse by being in chronic A. Fib. She is on Lopressor twice a day -- her EF is sufficient to be on twice a day Lopressor as opposed to Toprol. In the interest of avoiding hypotension, she is not on any afterload reduction. Not requiring any diuretic either.      Hyperlipidemia with target LDL less than 70 (Chronic)    Recent lipid panel looks great. Continue current low-dose statin.      Essential hypertension (Chronic)    No longer an issue. On stable dose of beta blocker.      Relevant Orders   EKG 12-Lead   Chronic atrial fibrillation (HCC) (Chronic)    Very strange that her rate is now so much better improved. My plan all along was to increase to 50 mg twice a day metoprolol which she is now on. No longer on digoxin or amiodarone. This patients CHA2DS2-VASc Score and unadjusted Ischemic Stroke Rate (% per year) is equal to 10.8 % stroke rate/year from  a score of 8 Converted to ELIQUIS in the fall after her stroke. No bleeding complications.      2 Vessel CAD - s/p CABG; LIMA-LAD, SVG-RCA (Chronic)    Stable with no active anginal symptoms. Myoview in 2015 was negative for ischemia or infarction. Would not be due for follow-up until 2019. On aspirin, beta blocker and statin.          Current medicines are reviewed at length with the patient today. (+/-  concerns) none The following changes have been made: none  Continue off of Digoxin for now  Follow-up 3 months w/ Dr. Herbie Baltimore.   Studies Ordered:   Orders Placed This Encounter  Procedures  . EKG 12-Lead      Marykay Lex, M.D., M.S. Interventional Cardiologist   Pager # [phone] Phone # [phone] [address] [place], [place] [address]

## 2015-05-13 ENCOUNTER — Encounter: Payer: Self-pay | Admitting: Cardiology

## 2015-05-13 DIAGNOSIS — I272 Pulmonary hypertension, unspecified: Secondary | ICD-10-CM | POA: Insufficient documentation

## 2015-05-13 NOTE — Assessment & Plan Note (Signed)
No longer an issue. On stable dose of beta blocker.

## 2015-05-13 NOTE — Assessment & Plan Note (Addendum)
Very strange that her rate is now so much better improved. My plan all along was to increase to 50 mg twice a day metoprolol which she is now on. No longer on digoxin or amiodarone. This patients CHA2DS2-VASc Score and unadjusted Ischemic Stroke Rate (% per year) is equal to 10.8 % stroke rate/year from a score of 8 Converted to [facility] in the fall after her stroke. No bleeding complications.

## 2015-05-13 NOTE — Assessment & Plan Note (Signed)
Recent episode in January was thought to be related to A. fib RVR and hypotension. No new acute findings on MRI.

## 2015-05-13 NOTE — Assessment & Plan Note (Signed)
Elevated PA pressure by echo. I'm not sure how much of a component the left-sided diastolic pressures are playing. I have significant pulmonary history but was a former smoker.  We will continue to monitor. She has had some readings in the high 40s, this is the first one in the 50s. If she does start having symptoms of dyspnea, may need to consider more detailed evaluation and potential treatment.

## 2015-05-13 NOTE — Assessment & Plan Note (Signed)
Essentially, low normal EF. Probably made worse by being in chronic A. Fib. She is on Lopressor twice a day -- her EF is sufficient to be on twice a day Lopressor as opposed to Toprol. In the interest of avoiding hypotension, she is not on any afterload reduction. Not requiring any diuretic either.

## 2015-05-13 NOTE — Assessment & Plan Note (Signed)
Recent lipid panel looks great. Continue current low-dose statin.

## 2015-05-13 NOTE — Assessment & Plan Note (Signed)
Overall, I think this continues to be a concerning thing for her. She needs to supplement her meals. We discussed adding supplementation such as boost or ensure her even [employer]. She should supplement each meal with a shake or smoothie as well as have an additional one or 2 shake/movies for snacks. She needs to increase her protein intake.  I fear that if she continues this direction she will be showing more signs of failure to thrive.

## 2015-05-13 NOTE — Assessment & Plan Note (Signed)
Stable with no active anginal symptoms. Myoview in 2015 was negative for ischemia or infarction. Would not be due for follow-up until 2019. On aspirin, beta blocker and statin.

## 2015-05-13 NOTE — Assessment & Plan Note (Signed)
No active claudication symptoms. She is on aspirin and statin.

## 2015-05-13 NOTE — Assessment & Plan Note (Signed)
In the setting of A. fib with held anticoagulation. She has chronic A. fib that is rate controlled. Now on ELIQUIS for anticoagulation.

## 2015-06-17 ENCOUNTER — Other Ambulatory Visit: Payer: Self-pay | Admitting: Cardiology

## 2015-06-17 NOTE — Telephone Encounter (Signed)
REFILL 

## 2015-06-23 ENCOUNTER — Ambulatory Visit: Payer: Medicare HMO | Admitting: Neurology

## 2015-07-09 ENCOUNTER — Ambulatory Visit: Payer: Medicare HMO | Admitting: Neurology

## 2015-07-12 ENCOUNTER — Encounter: Payer: Self-pay | Admitting: Neurology

## 2015-07-22 ENCOUNTER — Encounter: Payer: Self-pay | Admitting: Cardiology

## 2015-07-22 ENCOUNTER — Ambulatory Visit (INDEPENDENT_AMBULATORY_CARE_PROVIDER_SITE_OTHER): Payer: Medicare HMO | Admitting: Cardiology

## 2015-07-22 VITALS — BP 116/62 | HR 70 | Ht 60.0 in | Wt 89.0 lb

## 2015-07-22 DIAGNOSIS — I10 Essential (primary) hypertension: Secondary | ICD-10-CM

## 2015-07-22 DIAGNOSIS — I272 Other secondary pulmonary hypertension: Secondary | ICD-10-CM

## 2015-07-22 DIAGNOSIS — I2510 Atherosclerotic heart disease of native coronary artery without angina pectoris: Secondary | ICD-10-CM | POA: Diagnosis not present

## 2015-07-22 DIAGNOSIS — I482 Chronic atrial fibrillation: Secondary | ICD-10-CM | POA: Diagnosis not present

## 2015-07-22 DIAGNOSIS — I4821 Permanent atrial fibrillation: Secondary | ICD-10-CM

## 2015-07-22 DIAGNOSIS — E785 Hyperlipidemia, unspecified: Secondary | ICD-10-CM

## 2015-07-22 DIAGNOSIS — R634 Abnormal weight loss: Secondary | ICD-10-CM

## 2015-07-22 NOTE — Progress Notes (Signed)
PCP: [name], MD  Clinic Note: Chief Complaint  Patient presents with  . 3 MONTHS    NO COMPLAINTS.  Marland Kitchen Atrial Fibrillation  . Coronary Artery Disease    HPI: [name] is a 75 y.o. female with a PMH below who presents today for 1 month f/u - CAD/CABG, mild ICM (EF 40-45%), Chronic Afib, CVA, PAD & now basically failure to thrive.  She had a CVA last fall - apparently when her Pradaxa was on hold for GI procedure -> for some reason, Amiodarone was stopped (presumably b/c she remains in permanent Afib, but the Amiodarone was effective in rate control. She was also converted from Pradaxa to Xarelto the Eliquis (? Was not Pradaxa failure).  She has been having issues with her Afib rate control since her CVA last year.  She was in Afib RVR when I saw her on Dec 28   I have actually seen her on December 28. She was in A. fib RVR -- BB dose was increased to 25 mg AM & 50 mg PM with PRN 25 mg for RVR. Was also on Digoxin (I had her temporarily take full dose).  She return for follow-up EKG on December 30 and was increased to metoprolol 50 mg twice a day. Unfortunately on January 2 she was admitted for what looked like TIA.  She saw a PA on Jan 19 for f/u - Digoxin was stopped. Rate was better & no residual TIA/CVA Sx.  I last saw her on May 11 2015. Was doing much better - more vocal & responsive.  Rate improved off of Amio & digoxin.  Recent Hospitalizations: Jan 2016 - TIA/CVA, none since.  Studies Reviewed:   None  Interval History: Interestingly, [name] presents today more like her usual [place].  Continues to be vocal and responsive, even telling stories about her sister's heath.  Her rate is stable & she has not noted any changes in her rate despite being on less medication. Again, she is here alone & fending for herself well - fully able to answer questions fluently. He is not noting any exertion or resting chest discomfort/pressure. No resting or exertional dyspnea.  No PND,  orthopnea or edema despite Echo evidence to suggest Pulmonary HTN. No sensation of palpitations, lightheadedness, dizziness, weakness or syncope/near syncope. No further TIA/amaurosis fugax symptoms. No melena, hematochezia, hematuria, or epstaxis. No claudication.  ROS: A comprehensive was performed. Review of Systems  Constitutional: Positive for weight loss (Finally starting to gain & maintain weight.). Negative for malaise/fatigue.  HENT: Positive for hearing loss.   Eyes:       Eyes are still very bad - L worse than R.  Respiratory: Negative for cough and shortness of breath.   Gastrointestinal: Negative for heartburn and abdominal pain.  Genitourinary: Negative for dysuria.  Musculoskeletal: Positive for joint pain (Stable) and neck pain. Negative for myalgias.  Neurological: Negative for dizziness and headaches.       No recurrent TIA symptoms  Endo/Heme/Allergies: Does not bruise/bleed easily.  Psychiatric/Behavioral: Negative for depression. The patient is not nervous/anxious (she does have spells of anxiety).   All other systems reviewed and are negative.   Past Medical History  Diagnosis Date  . S/P CABG x 2 2009    LIMA-LAD, SVG-RCA, with AV fistula ligation and Maze procedure  . CAD (coronary artery disease), native coronary artery 2009    75% LAD, diffuse 75-90% RCA --> Referred for CABG + MAZE;  Cardiolite 12/2013: No ischemia or  Infarction  . Ischemic cardiomyopathy 2012    EF ~40-45% by Echo  . Persistent atrial fibrillation (HCC) 04/05/2012    Now permanent A. fib s/p MAZE -- recurrence, cardioversion-converted to sinus bradycardia --> Now persistent  . Essential hypertension   . Dyslipidemia, goal LDL below 70     On Crestor, followed by PCP  . COPD (chronic obstructive pulmonary disease) (HCC)   . PAD (peripheral artery disease) (HCC) 2007    s/p L Ileac A stent ; most recent Dopplers July 2012: Less than 50% reduction bilaterally. ABI 0.96 on the right 0.88 on  left;;;12/27/2011   -ABI right .87 and left ABI .78  ,LEFT CIA and EIA stent normall patency, left CFA,SFA,and popliteal 0-49%; rgt proximal SFA 50-69%,rgt CIA,EIA, and CFA 0-49%  . Chronic low back pain     scoliosis & lordosis  . H/O: pneumonia   . Hypothyroidism   . Anxiety   . Cervical neck pain with evidence of disc disease     with need for surgery -- November 2015  . Osteoarthritis of back     And neck, hands,spine  . H/O ischemic left MCA stroke 11/18/2014    MRI/MRA of head: Multifocal left MCA infarct.. Also possible left ACA territory infarct -- complete occlusion of left MCA distally at M2 level. Marked left ACA attenuation.  Marland Kitchen TIA (transient ischemic attack) 04/05/2015  . Pulmonary hypertension (HCC)     Estimated PA pressure on Echo January 2017 = 59 mmHg with dilated IVC, severely dilated RA and moderate TR.    Past Surgical History  Procedure Laterality Date  . Iliac artery stent Left 04/06/2005    Ex Iliac - CFA (Smart STENTS -- 7x4 in EIA, 6 x 3 CFA)   . Coronary artery bypass graft  2009    LIMA-LAD, SVG-RCA (also MAZE & AV fistula ligation)  . Maze  2009    along with CABG  . Cardioversion  04/05/2012    Procedure: CARDIOVERSION;  Surgeon: Thurmon Fair, MD;  Location: [facility] ENDOSCOPY;  Service: Cardiovascular;  Laterality: N/A;  . Transthoracic echocardiogram  Jan 2012    EF ~40-45%, global HK; PAP ~45-50 mmHg  . Nm myoview ltd  May 2013    No ischemia or Infarct  . Abdominal hysterectomy    . Cardiac catheterization  07/30/2007    75% LAD ,3 stenoses of 75-90% in  RCA;circumflex from proximal RCA with no lesion seen,normal ramus intermediate branh; normal LV systoilc function  . Nm cardiolite ltd  12/2013    Non-gated for Afib; No Ischemia or Infarction.  Marland Kitchen Anterior cervical decomp/discectomy fusion N/A 01/23/2014    Procedure: ANTERIOR CERVICAL DECOMPRESSION/DISCECTOMY FUSION CERVICAL FOUR-FIVE,CERVICAL FIVE-SIX,CERVICAL SIX-SEVEN;  Surgeon: Mariam Dollar, MD;   Location: [facility] NEURO ORS;  Service: Neurosurgery;  Laterality: N/A;  . Colonoscopy with propofol N/A 11/12/2014    Procedure: COLONOSCOPY WITH PROPOFOL;  Surgeon: Charna Elizabeth, MD;  Location: [facility] ENDOSCOPY;  Service: Endoscopy;  Laterality: N/A;  . Transthoracic echocardiogram  Jan 2017    EF ~50%. No RWMA, Afib. Paradoxical Septal motion. no DD assessment. Mod LA dilation.  PAP ~59 mmHg & dilated IVC. Mod TR. Severe RA dilation.   Prior to Admission medications   Medication Sig Start Date End Date Taking? Authorizing Provider  AMITIZA 24 MCG capsule Take 24 mcg by mouth 2 (two) times daily. 03/17/15  Yes Historical Provider, MD  apixaban (ELIQUIS) 5 MG TABS tablet Take 1 tablet (5 mg total) by mouth 2 (two) times  daily. 11/19/14  Yes Joseph ArtJessica U Vann, DO  aspirin EC 81 MG EC tablet Take 1 tablet (81 mg total) by mouth daily. 11/19/14  Yes Joseph ArtJessica U Vann, DO  bisacodyl (DULCOLAX) 10 MG suppository Place 1 suppository (10 mg total) rectally daily as needed for moderate constipation. 11/19/14  Yes Joseph ArtJessica U Vann, DO  CRESTOR 5 MG tablet TAKE 1 TABLET BY MOUTH IN THE EVENING 10/19/14  Yes Marykay Lexavid W Harding, MD  cyclobenzaprine (FLEXERIL) 10 MG tablet Take 10 mg by mouth 3 (three) times daily as needed for muscle spasms.   Yes Historical Provider, MD  docusate sodium (COLACE) 100 MG capsule Take 1 capsule (100 mg total) by mouth every 12 (twelve) hours. 06/13/14  Yes Derwood KaplanAnkit Nanavati, MD  lactulose (CHRONULAC) 10 GM/15ML solution Take 15 mLs (10 g total) by mouth 2 (two) times daily as needed for mild constipation. 11/19/14  Yes Joseph ArtJessica U Vann, DO  levothyroxine (SYNTHROID, LEVOTHROID) 75 MCG tablet Take 75 mcg by mouth daily before breakfast.   Yes Historical Provider, MD  metoprolol (LOPRESSOR) 50 MG tablet Take 1 tablet (50 mg total) by mouth 2 (two) times daily. 04/09/15  Yes Richarda OverlieNayana Abrol, MD  senna-docusate (SENOKOT-S) 8.6-50 MG tablet Take 1 tablet by mouth at bedtime as needed for mild constipation. 04/09/15  Yes  Richarda OverlieNayana Abrol, MD   No Known Allergies   Social History   Social History  . Marital Status: Widowed    Spouse Name: N/A  . Number of Children: N/A  . Years of Education: N/A   Social History Main Topics  . Smoking status: Former Smoker -- 0.50 packs/day    Types: Cigarettes    Quit date: 01/07/1998  . Smokeless tobacco: Never Used  . Alcohol Use: No  . Drug Use: No  . Sexual Activity: Not Asked   Other Topics Concern  . None   Social History Narrative    She is a divorced mother of 1 and one child who died. Her daughter is here with her   today. She has got 4 grandchildren. She is a native of [place] and only has an occasional alcoholic   beverage. She does not get routine exercise mostly because of her spine pain. She did previously smoke but quit many years ago.    Family History  Problem Relation Age of Onset  . Heart attack Mother   . Stroke Father   . Heart disease Brother 60    Wt Readings from Last 3 Encounters:  07/22/15 89 lb (40.37 kg)  05/11/15 84 lb 4.8 oz (38.238 kg)  04/22/15 85 lb 12.8 oz (38.919 kg)    PHYSICAL EXAM BP 116/62 mmHg  Pulse 70  Ht 5' (1.524 m)  Wt 89 lb (40.37 kg)  BMI 17.38 kg/m2 General appearance: alert, cooperative, appears stated age, no distress; pleasant mood and affect. Thin and frail. However not chronically ill-appearing - actually looks healthier. Neck: no adenopathy, no carotid bruit and no JVD Lungs: CTA B with mild interstitial sounds., normal percussion bilaterally and non-labored Heart: Irregularly irregular rate/rhythm; S1 & S2 normal, no murmur, click, rub or gallop ; nondisplaced PMI, but palpable due to how thin she is. Abdomen: soft, non-tender; bowel sounds normal; no masses,  no organomegaly; no HJR Extremities: extremities normal, atraumatic, no cyanosis, or edema  Pulses: 2+ and symmetric; mildly diminished pedal pulses Skin: no edema and no lesions noted; paperthin skin Neurologic: Mental status: Alert,  oriented, thought content appropriate -- answers questions appropriately   Cranial  nerves: normal (II-XII grossly intact)    Adult ECG Report  Rate: 70 ;  Rhythm: Afib with CVR. ~ Inc RBBB.;   Narrative Interpretation: Stable EKG   Other studies Reviewed: Additional studies/ records that were reviewed today include:  Recent Labs:    Lab Results  Component Value Date   CREATININE 0.87 04/09/2015   Lab Results  Component Value Date   K 4.1 04/09/2015   Lab Results  Component Value Date   CHOL 123 04/06/2015   HDL 63 04/06/2015   LDLCALC 47 04/06/2015   TRIG 64 04/06/2015   CHOLHDL 2.0 04/06/2015    ASSESSMENT / PLAN: Problem List Items Addressed This Visit    Weight loss (Chronic)    Finally starting to put back on weight & looking stronger.  Continued to encourage nutrition.  Consider supplementation with protein shakes.      Relevant Orders   EKG 12-Lead   Pulmonary hypertension (HCC)    Echo suggested elevated pressures - ?? If this was simply in the setting of RVR with DHF.   Has not required diuresis.  No notable dyspnea.  Continue to monitor - may recheck Echo next year (when in stable / rate controlled afib).       Relevant Medications   metoprolol succinate (TOPROL-XL) 25 MG 24 hr tablet   Other Relevant Orders   EKG 12-Lead   Permanent atrial fibrillation (HCC) - Primary (Chronic)    Maintaining stable rate with BID Metoprolol (reduced to 25 mg bid). Can use additional dose for RVR. CHADS2VASC2 Score 8 -> on Eliquis. Ok to use Amiodarone for short term rate control if RVR recurs.      Relevant Medications   metoprolol succinate (TOPROL-XL) 25 MG 24 hr tablet   Other Relevant Orders   EKG 12-Lead   Hyperlipidemia with target LDL less than 70 (Chronic)    Most recent lipid panel looks good.  Continue statin.      Relevant Medications   metoprolol succinate (TOPROL-XL) 25 MG 24 hr tablet   Other Relevant Orders   EKG 12-Lead   Essential  hypertension (Chronic)   Relevant Medications   metoprolol succinate (TOPROL-XL) 25 MG 24 hr tablet   Other Relevant Orders   EKG 12-Lead   2 Vessel CAD - s/p CABG; LIMA-LAD, SVG-RCA (Chronic)    Continues to be stable - no angina Sx.  No regional WMA on Echo to suggest recent MI.  Has tolerated RVR on several occasions.  She is now more active & still denies any symptoms. Negative Myoview in 2015 - > relook in ?2019. On ASA, BB & statin.      Relevant Medications   metoprolol succinate (TOPROL-XL) 25 MG 24 hr tablet   Other Relevant Orders   EKG 12-Lead      Current medicines are reviewed at length with the patient today. (+/- concerns) none The following changes have been made: none  Continue off of Digoxin for now  Follow-up 6 months w/ Dr. Herbie Baltimore.   Studies Ordered:   Orders Placed This Encounter  Procedures  . EKG 12-Lead      Marykay Lex, M.D., M.S. Interventional Cardiologist   Pager # [phone] Phone # [phone] [address] [place], [place] [address]

## 2015-07-22 NOTE — Patient Instructions (Signed)
Your physician wants you to follow-up in: 6 Months You will receive a reminder letter in the mail two months in advance. If you don't receive a letter, please call our office to schedule the follow-up appointment.  

## 2015-07-24 ENCOUNTER — Encounter: Payer: Self-pay | Admitting: Cardiology

## 2015-07-24 NOTE — Assessment & Plan Note (Signed)
Most recent lipid panel looks good.  Continue statin.

## 2015-07-24 NOTE — Assessment & Plan Note (Signed)
Finally starting to put back on weight & looking stronger.  Continued to encourage nutrition.  Consider supplementation with protein shakes.

## 2015-07-24 NOTE — Assessment & Plan Note (Signed)
Echo suggested elevated pressures - ?? If this was simply in the setting of RVR with DHF.   Has not required diuresis.  No notable dyspnea.  Continue to monitor - may recheck Echo next year (when in stable / rate controlled afib).

## 2015-07-24 NOTE — Assessment & Plan Note (Signed)
Maintaining stable rate with BID Metoprolol (reduced to 25 mg bid). Can use additional dose for RVR. CHADS2VASC2 Score 8 -> on Eliquis. Ok to use Amiodarone for short term rate control if RVR recurs.

## 2015-07-24 NOTE — Assessment & Plan Note (Signed)
Continues to be stable - no angina Sx.  No regional WMA on Echo to suggest recent MI.  Has tolerated RVR on several occasions.  She is now more active & still denies any symptoms. Negative Myoview in 2015 - > relook in ?2019. On ASA, BB & statin.

## 2015-08-09 ENCOUNTER — Ambulatory Visit: Payer: Medicare HMO | Admitting: Cardiology

## 2015-09-11 ENCOUNTER — Emergency Department (HOSPITAL_COMMUNITY)
Admission: EM | Admit: 2015-09-11 | Discharge: 2015-09-11 | Disposition: A | Discharge status: Home / Self Care | Payer: Medicare HMO | Attending: Emergency Medicine | Admitting: Emergency Medicine

## 2015-09-11 ENCOUNTER — Encounter (HOSPITAL_COMMUNITY): Payer: Self-pay | Admitting: *Deleted

## 2015-09-11 ENCOUNTER — Emergency Department (HOSPITAL_COMMUNITY): Payer: Medicare HMO

## 2015-09-11 DIAGNOSIS — Z7982 Long term (current) use of aspirin: Secondary | ICD-10-CM | POA: Insufficient documentation

## 2015-09-11 DIAGNOSIS — I10 Essential (primary) hypertension: Secondary | ICD-10-CM | POA: Insufficient documentation

## 2015-09-11 DIAGNOSIS — M546 Pain in thoracic spine: Secondary | ICD-10-CM | POA: Insufficient documentation

## 2015-09-11 DIAGNOSIS — Z79899 Other long term (current) drug therapy: Secondary | ICD-10-CM | POA: Insufficient documentation

## 2015-09-11 DIAGNOSIS — Z951 Presence of aortocoronary bypass graft: Secondary | ICD-10-CM | POA: Insufficient documentation

## 2015-09-11 DIAGNOSIS — I2510 Atherosclerotic heart disease of native coronary artery without angina pectoris: Secondary | ICD-10-CM | POA: Diagnosis not present

## 2015-09-11 DIAGNOSIS — Z87891 Personal history of nicotine dependence: Secondary | ICD-10-CM | POA: Diagnosis not present

## 2015-09-11 DIAGNOSIS — Z8673 Personal history of transient ischemic attack (TIA), and cerebral infarction without residual deficits: Secondary | ICD-10-CM | POA: Diagnosis not present

## 2015-09-11 DIAGNOSIS — J449 Chronic obstructive pulmonary disease, unspecified: Secondary | ICD-10-CM | POA: Insufficient documentation

## 2015-09-11 LAB — CBC
HCT: 38.9 % (ref 36.0–46.0)
Hemoglobin: 11 g/dL — ABNORMAL LOW (ref 12.0–15.0)
MCH: 21.4 pg — ABNORMAL LOW (ref 26.0–34.0)
MCHC: 28.3 g/dL — ABNORMAL LOW (ref 30.0–36.0)
MCV: 75.5 fL — ABNORMAL LOW (ref 78.0–100.0)
Platelets: 245 K/uL (ref 150–400)
RBC: 5.15 MIL/uL — ABNORMAL HIGH (ref 3.87–5.11)
RDW: 17.7 % — ABNORMAL HIGH (ref 11.5–15.5)
WBC: 8.4 K/uL (ref 4.0–10.5)

## 2015-09-11 LAB — BASIC METABOLIC PANEL WITH GFR
Anion gap: 9 (ref 5–15)
BUN: 15 mg/dL (ref 6–20)
CO2: 23 mmol/L (ref 22–32)
Calcium: 8.6 mg/dL — ABNORMAL LOW (ref 8.9–10.3)
Chloride: 103 mmol/L (ref 101–111)
Creatinine, Ser: 0.99 mg/dL (ref 0.44–1.00)
GFR calc Af Amer: >60 mL/min (ref >60)
GFR calc non Af Amer: 54 mL/min — ABNORMAL LOW (ref >60)
Glucose, Bld: 136 mg/dL — ABNORMAL HIGH (ref 65–99)
Potassium: 4.5 mmol/L (ref 3.5–5.1)
Sodium: 135 mmol/L (ref 135–145)

## 2015-09-11 LAB — I-STAT TROPONIN, ED: Troponin i, poc: 0 ng/mL (ref 0.00–0.08)

## 2015-09-11 MED ORDER — MORPHINE SULFATE (PF) 4 MG/ML IV SOLN
4.0000 mg | Freq: Once | INTRAVENOUS | Status: AC
  Administered 2015-09-11: 4 mg via INTRAVENOUS
  Filled 2015-09-11: qty 1

## 2015-09-11 MED ORDER — HYDROCODONE-ACETAMINOPHEN 5-325 MG PO TABS: 1.0000 | ORAL_TABLET | Freq: Four times a day (QID) | ORAL | Status: DC | PRN

## 2015-09-11 NOTE — ED Notes (Signed)
Pt reports having chronic back pain but also having dull left arm pain x 2 weeks, reports it increases with movement. Denies recent cough.

## 2015-09-11 NOTE — ED Provider Notes (Signed)
CSN: [id number]     Arrival date & time 09/11/15  1047 History   First MD Initiated Contact with Patient 09/11/15 1050     Chief Complaint  Patient presents with  . Back Pain  . Arm Pain     (Consider location/radiation/quality/duration/timing/severity/associated sxs/prior Treatment) HPI Comments: Patient is a 75 year old female with history of chronic atrial fibrillation, coronary artery disease with CABG 2, hypertension. She presents for evaluation of pain in her mid to upper back that has been present for the past 2 days. She has had similar episodes in the past which have been attributed to a "muscle spasm". She denies any chest pain or difficulty breathing. She denies any fevers, chills, or cough. Her pain is worse with palpation and movement and relieved somewhat with rest.  Patient is a 75 y.o. female presenting with back pain. The history is provided by the patient.  Back Pain Location:  Thoracic spine Quality:  Stabbing Radiates to:  Does not radiate Pain severity:  Moderate Duration:  2 days Timing:  Constant Progression:  Worsening Chronicity:  Recurrent Relieved by:  Nothing Worsened by:  Nothing tried Ineffective treatments:  None tried   Past Medical History  Diagnosis Date  . S/P CABG x 2 2009    LIMA-LAD, SVG-RCA, with AV fistula ligation and Maze procedure  . CAD (coronary artery disease), native coronary artery 2009    75% LAD, diffuse 75-90% RCA --> Referred for CABG + MAZE;  Cardiolite 12/2013: No ischemia or Infarction  . Ischemic cardiomyopathy 2012    EF ~40-45% by Echo  . Persistent atrial fibrillation (HCC) 04/05/2012    Now permanent A. fib s/p MAZE -- recurrence, cardioversion-converted to sinus bradycardia --> Now persistent  . Essential hypertension   . Dyslipidemia, goal LDL below 70     On Crestor, followed by PCP  . COPD (chronic obstructive pulmonary disease) (HCC)   . PAD (peripheral artery disease) (HCC) 2007    s/p L Ileac A stent ; most  recent Dopplers July 2012: Less than 50% reduction bilaterally. ABI 0.96 on the right 0.88 on left;;;12/27/2011   -ABI right .87 and left ABI .78  ,LEFT CIA and EIA stent normall patency, left CFA,SFA,and popliteal 0-49%; rgt proximal SFA 50-69%,rgt CIA,EIA, and CFA 0-49%  . Chronic low back pain     scoliosis & lordosis  . H/O: pneumonia   . Hypothyroidism   . Anxiety   . Cervical neck pain with evidence of disc disease     with need for surgery -- November 2015  . Osteoarthritis of back     And neck, hands,spine  . H/O ischemic left MCA stroke 11/18/2014    MRI/MRA of head: Multifocal left MCA infarct.. Also possible left ACA territory infarct -- complete occlusion of left MCA distally at M2 level. Marked left ACA attenuation.  Marland Kitchen TIA (transient ischemic attack) 04/05/2015  . Pulmonary hypertension (HCC)     Estimated PA pressure on Echo January 2017 = 59 mmHg with dilated IVC, severely dilated RA and moderate TR.   Past Surgical History  Procedure Laterality Date  . Iliac artery stent Left 04/06/2005    Ex Iliac - CFA (Smart STENTS -- 7x4 in EIA, 6 x 3 CFA)   . Coronary artery bypass graft  2009    LIMA-LAD, SVG-RCA (also MAZE & AV fistula ligation)  . Maze  2009    along with CABG  . Cardioversion  04/05/2012    Procedure: CARDIOVERSION;  Surgeon: Rachelle Hora Croitoru,  MD;  Location: [facility];  Service: Cardiovascular;  Laterality: N/A;  . Transthoracic echocardiogram  Jan 2012    EF ~40-45%, global HK; PAP ~45-50 mmHg  . Nm myoview ltd  May 2013    No ischemia or Infarct  . Abdominal hysterectomy    . Cardiac catheterization  07/30/2007    75% LAD ,3 stenoses of 75-90% in  RCA;circumflex from proximal RCA with no lesion seen,normal ramus intermediate branh; normal LV systoilc function  . Nm cardiolite ltd  12/2013    Non-gated for Afib; No Ischemia or Infarction.  Marland Kitchen. Anterior cervical decomp/discectomy fusion N/A 01/23/2014    Procedure: ANTERIOR CERVICAL DECOMPRESSION/DISCECTOMY  FUSION CERVICAL FOUR-FIVE,CERVICAL FIVE-SIX,CERVICAL SIX-SEVEN;  Surgeon: Mariam DollarGary P Cram, MD;  Location: [facility];  Service: Neurosurgery;  Laterality: N/A;  . Colonoscopy with propofol N/A 11/12/2014    Procedure: COLONOSCOPY WITH PROPOFOL;  Surgeon: Charna ElizabethJyothi Mann, MD;  Location: [facility];  Service: Endoscopy;  Laterality: N/A;  . Transthoracic echocardiogram  Jan 2017    EF ~50%. No RWMA, Afib. Paradoxical Septal motion. no DD assessment. Mod LA dilation.  PAP ~59 mmHg & dilated IVC. Mod TR. Severe RA dilation.   Family History  Problem Relation Age of Onset  . Heart attack Mother   . Stroke Father   . Heart disease Brother 6960   Social History  Substance Use Topics  . Smoking status: Former Smoker -- 0.50 packs/day    Types: Cigarettes    Quit date: 01/07/1998  . Smokeless tobacco: Never Used  . Alcohol Use: No   OB History    No data available     Review of Systems  Musculoskeletal: Positive for back pain.  All other systems reviewed and are negative.     Allergies  Review of patient's allergies indicates no known allergies.  Home Medications   Prior to Admission medications   Medication Sig Start Date End Date Taking? Authorizing Provider  AMITIZA 24 MCG capsule Take 24 mcg by mouth 2 (two) times daily. 03/17/15   Historical Provider, MD  apixaban (ELIQUIS) 5 MG TABS tablet Take 1 tablet (5 mg total) by mouth 2 (two) times daily. 11/19/14   Joseph ArtJessica U Vann, DO  aspirin EC 81 MG EC tablet Take 1 tablet (81 mg total) by mouth daily. 11/19/14   Joseph ArtJessica U Vann, DO  bisacodyl (DULCOLAX) 10 MG suppository Place 1 suppository (10 mg total) rectally daily as needed for moderate constipation. 11/19/14   Joseph ArtJessica U Vann, DO  docusate sodium (COLACE) 100 MG capsule Take 1 capsule (100 mg total) by mouth every 12 (twelve) hours. 06/13/14   Derwood KaplanAnkit Nanavati, MD  lactulose (CHRONULAC) 10 GM/15ML solution Take 15 mLs (10 g total) by mouth 2 (two) times daily as needed for mild constipation.  11/19/14   Joseph ArtJessica U Vann, DO  levothyroxine (SYNTHROID, LEVOTHROID) 75 MCG tablet Take 75 mcg by mouth daily before breakfast.    Historical Provider, MD  metoprolol succinate (TOPROL-XL) 25 MG 24 hr tablet Take 12.5 mg by mouth 2 (two) times daily.    Historical Provider, MD  rosuvastatin (CRESTOR) 5 MG tablet TAKE 1 TABLET BY MOUTH IN THE EVENING 06/17/15   Marykay Lexavid W Harding, MD  senna-docusate (SENOKOT-S) 8.6-50 MG tablet Take 1 tablet by mouth at bedtime as needed for mild constipation. 04/09/15   Richarda OverlieNayana Abrol, MD   BP 132/93 mmHg  Pulse 145  Temp(Src) 98.1 F (36.7 C) (Oral)  Resp 18  SpO2 98% Physical Exam  Constitutional: She is  oriented to person, place, and time. She appears well-developed and well-nourished. No distress.  HENT:  Head: Normocephalic and atraumatic.  Neck: Normal range of motion. Neck supple.  Cardiovascular: Normal rate and regular rhythm.  Exam reveals no gallop and no friction rub.   No murmur heard. Pulmonary/Chest: Effort normal and breath sounds normal. No respiratory distress. She has no wheezes.  Abdominal: Soft. Bowel sounds are normal. She exhibits no distension. There is no tenderness.  Musculoskeletal: Normal range of motion.  There is tenderness to palpation of the soft tissues of the mid thoracic region between the shoulder blades. There is no bony tenderness or step-off.  Neurological: She is alert and oriented to person, place, and time.  Skin: Skin is warm and dry. She is not diaphoretic.  Nursing note and vitals reviewed.   ED Course  Procedures (including critical care time) Labs Review Labs Reviewed  BASIC METABOLIC PANEL  CBC  I-STAT TROPOININ, ED    Imaging Review No results found. I have personally reviewed and evaluated these images and lab results as part of my medical decision-making.   EKG Interpretation   Date/Time:  Saturday September 11 2015 10:57:28 EDT Ventricular Rate:  141 PR Interval:    QRS Duration: 85 QT Interval:   286 QTC Calculation: 438 R Axis:   33 Text Interpretation:  Atrial fibrillation Ventricular premature complex  Anteroseptal infarct, age indeterminate Baseline wander in lead(s) I III  aVL Confirmed by Zavior Thomason  MD, Tabita Corbo ([id number]) on 09/11/2015 11:21:03 AM      MDM   Final diagnoses:  None    Patient is a 75 year old female with past medical history as above. She presents for evaluation of pain in her midthoracic back. She has had these episodes for over 2 years and has been seen by multiple physicians for it. No definite cause is been found. She is tender in this area and her pain is reproducible with palpation. There is no evidence for a cardiac etiology. Chest x-ray is clear, EKG is unchanged, and troponin is negative. She was initially somewhat tachycardic while in atrial fibrillation, however she states that this happens when she develops this pain. She appears appropriate for discharge. She has a follow-up appointment next week with a neurologist. She was offered a CT scan to further explore where this pain is coming from, however she declined. She states that she has had these in the past and does not feel as though this will show anything.    Geoffery Lyons, MD 09/11/15 1233

## 2015-09-11 NOTE — Discharge Instructions (Signed)
Hydrocodone as prescribed as needed for pain.  Follow-up with your doctors as scheduled next week, and return to the ER symptoms significantly worsen or change.   Back Pain, Adult Back pain is very common in adults.The cause of back pain is rarely dangerous and the pain often gets better over time.The cause of your back pain may not be known. Some common causes of back pain include:  Strain of the muscles or ligaments supporting the spine.  Wear and tear (degeneration) of the spinal disks.  Arthritis.  Direct injury to the back. For many people, back pain may return. Since back pain is rarely dangerous, most people can learn to manage this condition on their own. HOME CARE INSTRUCTIONS Watch your back pain for any changes. The following actions may help to lessen any discomfort you are feeling:  Remain active. It is stressful on your back to sit or stand in one place for long periods of time. Do not sit, drive, or stand in one place for more than 30 minutes at a time. Take short walks on even surfaces as soon as you are able.Try to increase the length of time you walk each day.  Exercise regularly as directed by your health care provider. Exercise helps your back heal faster. It also helps avoid future injury by keeping your muscles strong and flexible.  Do not stay in bed.Resting more than 1-2 days can delay your recovery.  Pay attention to your body when you bend and lift. The most comfortable positions are those that put less stress on your recovering back. Always use proper lifting techniques, including:  Bending your knees.  Keeping the load close to your body.  Avoiding twisting.  Find a comfortable position to sleep. Use a firm mattress and lie on your side with your knees slightly bent. If you lie on your back, put a pillow under your knees.  Avoid feeling anxious or stressed.Stress increases muscle tension and can worsen back pain.It is important to recognize when  you are anxious or stressed and learn ways to manage it, such as with exercise.  Take medicines only as directed by your health care provider. Over-the-counter medicines to reduce pain and inflammation are often the most helpful.Your health care provider may prescribe muscle relaxant drugs.These medicines help dull your pain so you can more quickly return to your normal activities and healthy exercise.  Apply ice to the injured area:  Put ice in a plastic bag.  Place a towel between your skin and the bag.  Leave the ice on for 20 minutes, 2-3 times a day for the first 2-3 days. After that, ice and heat may be alternated to reduce pain and spasms.  Maintain a healthy weight. Excess weight puts extra stress on your back and makes it difficult to maintain good posture. SEEK MEDICAL CARE IF:  You have pain that is not relieved with rest or medicine.  You have increasing pain going down into the legs or buttocks.  You have pain that does not improve in one week.  You have night pain.  You lose weight.  You have a fever or chills. SEEK IMMEDIATE MEDICAL CARE IF:   You develop new bowel or bladder control problems.  You have unusual weakness or numbness in your arms or legs.  You develop nausea or vomiting.  You develop abdominal pain.  You feel faint.   This information is not intended to replace advice given to you by your health care provider. Make sure  you discuss any questions you have with your health care provider. °  °Document Released: 03/20/2005 Document Revised: 04/10/2014 Document Reviewed: 07/22/2013 °[employer]vier Interactive Patient Education ©2016 [employer] ° °

## 2015-09-13 ENCOUNTER — Ambulatory Visit (INDEPENDENT_AMBULATORY_CARE_PROVIDER_SITE_OTHER): Payer: Medicare HMO | Admitting: Neurology

## 2015-09-13 ENCOUNTER — Encounter: Payer: Self-pay | Admitting: Neurology

## 2015-09-13 VITALS — BP 146/101 | HR 76 | Ht 60.0 in | Wt 90.2 lb

## 2015-09-13 DIAGNOSIS — I639 Cerebral infarction, unspecified: Secondary | ICD-10-CM | POA: Diagnosis not present

## 2015-09-13 DIAGNOSIS — I10 Essential (primary) hypertension: Secondary | ICD-10-CM

## 2015-09-13 DIAGNOSIS — I482 Chronic atrial fibrillation, unspecified: Secondary | ICD-10-CM

## 2015-09-13 DIAGNOSIS — Z7901 Long term (current) use of anticoagulants: Secondary | ICD-10-CM | POA: Diagnosis not present

## 2015-09-13 DIAGNOSIS — E785 Hyperlipidemia, unspecified: Secondary | ICD-10-CM

## 2015-09-13 NOTE — Progress Notes (Signed)
STROKE NEUROLOGY FOLLOW UP NOTE  NAME: [name] DOB: 11/19/1940  REASON FOR VISIT: stroke follow up HISTORY FROM: pt and chart  Today we had the pleasure of seeing [name] in follow-up at our Neurology Clinic. Pt was accompanied by grandson.   History Summary [name] is a 75 y.o. female with history of HTN, hyperlipidemia, CAD s/p CABG on ASA 81, ischemic cardiomyopathy, PAF on xarelto 15mg , and PAD was admitted on 11/18/14 for aphasia and right face weakness. MRI showed left MCA territory infarcts, MRA showed narrowing distal left M1 and complete occlusion of distal left M2. Left ACA poorly seen. CUS and TTE unremarkable. LDL 81 and A1c 5.5. 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 Patient at home only on 15mg  of Xarelto, likely under dosed. Put on full dose eliquis 5mg  bid and continue aspirin 81. She was discharged with eliquis, ASA and Crestor.  She was readmitted on 04/05/2015 for imbalance, altered mental status, speech difficulty and headache. MRI showed no acute infarct. TTE showed EF 50%. LDL 47 and A1c 5.9. She was found to have UTI on urinalysis, and concerning for aspiration on CXR. She was put on Rocephin and azithromycin. She was discharged with continued eliquis, aspirin and Crestor.  Interval History During the interval time, the patient has been doing well. She has no recurrent stroke like symptoms. She is not on amiodarone anymore. She is on digoxin and metoprolol for afib management. Recently started to have back pain and on percocet PRN. She is following with pain management. On eliquis and ASA, no bleeding side effects. Her BP today 146/80, but not checking at home.   REVIEW OF SYSTEMS: Full 14 system review of systems performed and notable only for those listed below and in HPI above, all others are negative:  Constitutional:   Cardiovascular:  Ear/Nose/Throat:   Skin:  Eyes:   Respiratory:     Gastroitestinal:   Genitourinary:  Hematology/Lymphatic:   Endocrine:  Musculoskeletal:   Allergy/Immunology:   Neurological:   Psychiatric:  Sleep:   The following represents the patient's updated allergies and side effects list: No Known Allergies  The neurologically relevant items on the patient's problem list were reviewed on today's visit.  Neurologic Examination  A problem focused neurological exam (12 or more points of the single system neurologic examination, vital signs counts as 1 point, cranial nerves count for 8 points) was performed.  Blood pressure 146/101, pulse 76, height 5' (1.524 m), weight 90 lb 3.2 oz (40.914 kg).  General - Well nourished, well developed, in no apparent distress.  Ophthalmologic - Fundi not visualized due to eye movement.  Cardiovascular - irregularly irregular heart rate and rhythm.  Mental Status -  Level of arousal and orientation to time, place, and person were intact. Language including expression, naming, repetition, comprehension was assessed and found intact. Fund of Knowledge was assessed and was intact.  Cranial Nerves II - XII - II - Visual field intact OU. III, IV, VI - Extraocular movements intact. V - Facial sensation intact bilaterally. VII - Facial movement intact bilaterally. VIII - Hearing & vestibular intact bilaterally. X - Palate elevates symmetrically. XI - Chin turning & shoulder shrug intact bilaterally. XII - Tongue protrusion intact.  Motor Strength - The patient's strength was normal in all extremities and pronator drift was absent.  Bulk was normal and fasciculations were absent.   Motor Tone - Muscle tone was assessed at the  neck and appendages and was normal.  Reflexes - The patient's reflexes were 1+ in all extremities and she had no pathological reflexes.  Sensory - Light touch, temperature/pinprick were assessed and were normal.    Coordination - The patient had normal movements in the hands and  feet with no ataxia or dysmetria.  Tremor was absent.  Gait and Station - The patient's transfers, posture, gait, station, and turns were observed as normal.   Functional score  mRS = 0   0 - No symptoms.   1 - No significant disability. Able to carry out all usual activities, despite some symptoms.   2 - Slight disability. Able to look after own affairs without assistance, but unable to carry out all previous activities.   3 - Moderate disability. Requires some help, but able to walk unassisted.   4 - Moderately severe disability. Unable to attend to own bodily needs without assistance, and unable to walk unassisted.   5 - Severe disability. Requires constant nursing care and attention, bedridden, incontinent.   6 - Dead.   NIH Stroke Scale = 0   Data reviewed: I personally reviewed the images and agree with the radiology interpretations.  Dg Chest 2 View 11/18/2014 Changes indicative of chronic bronchitis. Generalized interstitial prominence is stable. No new opacity. It should be noted that mild chronic congestive heart failure cannot be excluded in this circumstance. No airspace consolidation. No change in cardiac silhouette.   Ct Head Wo Contrast 11/18/2014 1. No acute intracranial pathology seen on CT. 2. Scattered small vessel ischemic microangiopathy. 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   MRI HEAD  11/18/2014 1. Patchy ischemic nonhemorrhagic multi focal left MCA territory infarcts as above. No significant mass effect. 2. Possible additional acute left ACA territory infarcts within the parasagittal anterior left frontal lobe. No associated hemorrhage or mass effect. 3. Age-related cerebral atrophy with mild chronic microvascular ischemic disease.   MRA HEAD  11/18/2014 1. Motion degraded study. 2. Narrowing with attenuation of the distal left M1 segment, which may be related to stenosis and/or occlusive thrombus. There is complete occlusion of  the left MCA just distally at the M2 level within the proximal left sylvian fissure. Left MCA branches are absent. 3. Markedly attenuated left anterior cerebral artery, poorly evaluated on this motion degraded exam. This may be related to stenosis and/or partially occlusive thrombus and/or hypoplasia. Further evaluation with dedicated CTA of the brain may be helpful for further characterization due to more rapid imaging time as clinically desired. 4. Grossly patent vertebrobasilar circulation.   CUS - There is 1-39% bilateral ICA stenosis. Vertebral artery flow is antegrade.   TTE - - The patient was in atrial fibrillation. Normal LV size with EF 55%. Septal bounce consistent with prior cardiac surgery. Normal RV size and systolic function. Biatrial enlargement. Moderate TR. Moderate pulmonary hypertension. Mild MR.  CT chest and abdomen and pelvis 1. There appears to be a thrombus in the left atrial appendage. Chronic cardiomegaly with particular enlargement of the right atrium and right ventricle. 2. Emphysema. 3. Large amount of stool in the rectum which could represent a fecal impaction. 4. Extensive atherosclerosis. 5. Amiodarone hepatotoxicity.  Ct Head Wo Contrast 04/05/2015  1. Changes consistent with old left middle cerebral infarct.  2. No evidence for acute intracranial abnormality.  3. Atrophy and small vessel disease.  4. Mild chronic sinusitis.   [name] Contrast 04/06/2015  1. No acute intracranial infarct or other process identified.  2. Late subacute left MCA territory infarct.  3. Additional chronic infarct within the posterior right temporoccipital region.  4. Mild chronic microvascular ischemic disease.   CXR - 1. Interstitial and faint airspace opacity in the right upper lobe suspicious for incipient pneumonia or aspiration pneumonitis. 2. Atherosclerosis and cardiomegaly. 3. Bony demineralization.  2D echo - - Left ventricle: The cavity  size was normal. Systolic function was mildly to moderately reduced. The estimated ejection fraction was 50%. Wall motion was normal; there were no regional wall motion abnormalities. The study was not technically sufficient to allow evaluation of LV diastolic dysfunction due to atrial fibrillation. - Ventricular septum: Septal motion showed paradox. - Aortic valve: There was no regurgitation. - Aortic root: The aortic root was normal in size. - Mitral valve: Calcified annulus. Mildly thickened leaflets . Transvalvular velocity was within the normal range. There was no evidence for stenosis. - Left atrium: The atrium was moderately dilated. - Right ventricle: The cavity size was moderately dilated. Wall thickness was normal. Systolic function was moderately reduced. - Right atrium: The atrium was severely dilated. - Tricuspid valve: There was moderate regurgitation. - Pulmonic valve: Structurally normal valve. There was mild regurgitation. - Pulmonary arteries: Systolic pressure was severely increased. PA peak pressure: 59 mm Hg (S). - Inferior vena cava: The vessel was dilated. The respirophasic diameter changes were blunted (< 50%), consistent with elevated central venous pressure. - Pericardium, extracardiac: There was no pericardial effusion.  Component     Latest Ref Rng 11/17/2014 11/18/2014 11/19/2014 04/06/2015  Cholesterol     0 - 200 mg/dL  161 096 045  Triglycerides     <150 mg/dL  57 49 64  HDL Cholesterol     >40 mg/dL  78 65 63  Total CHOL/HDL Ratio       2.2 2.1 2.0  VLDL     0 - 40 mg/dL  11 10 13   LDL (calc)     0 - 99 mg/dL  81 63 47  Hemoglobin W0J     4.8 - 5.6 % 5.5   5.9 (H)  Mean Plasma Glucose      111   123  TSH     0.350 - 4.500 uIU/mL    1.529    Assessment: As you may recall, she is a 75 y.o. Caucasian female with PMH of HTN, hyperlipidemia, CAD s/p CABG on ASA 81, ischemic cardiomyopathy, PAF on xarelto 15mg , and PAD was  admitted on 11/18/14 forleft MCA territory infarcts, MRA showed narrowing distal left M1 and complete occlusion of distal left M2. Left ACA poorly seen. CUS and TTE unremarkable. LDL 81 and A1c 5.5. 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 Patient at home only on 15mg  of Xarelto, likely under dosed. Put on full dose eliquis 5mg  bid and continue aspirin 81. She was readmitted on 04/05/2015 for altered mental status. MRI showed no acute infarct. TTE showed EF 50%. LDL 47 and A1c 5.9. She was found to have UTI on urinalysis, and concerning for aspiration on CXR. She was treated with antibiotics. She was discharged with continued eliquis, aspirin and Crestor. During the interval time, the patient has been doing well.  No recurrent stroke, no bleeding episode. Had recent back pain, on pain management.  Plan:  - continue eliquis, ASA and crestor for stroke prevention - follow up with cardiology for afib management - Follow up with your primary care physician for stroke risk factor modification.  Recommend maintain blood pressure goal <140/80, diabetes with hemoglobin A1c goal below 6.5% and lipids with LDL cholesterol goal below 70 mg/dL.  - check BP at home and record - follow up with pain management for back pain - follow up in 4 months.   I spent more than 25 minutes of face to face time with the patient. Greater than 50% of time was spent in counseling and coordination of care. We discussed about bleeding precautions, medication compliance and back pain treatment.    No orders of the defined types were placed in this encounter.    Meds ordered this encounter  Medications  . oxyCODONE-acetaminophen (PERCOCET/ROXICET) 5-325 MG tablet    Sig:     Patient Instructions  - continue eliquis, ASA and crestor for stroke prevention - follow up with cardiology for afib management - Follow up with your primary care physician for stroke risk factor modification.  Recommend maintain blood pressure goal <140/80, diabetes with hemoglobin A1c goal below 6.5% and lipids with LDL cholesterol goal below 70 mg/dL.  - check BP at home and record - follow up with pain management for back pain - follow up in 4 months.     Marvel Plan, MD PhD [facility] [address] [place], [place] [address] [phone]

## 2015-09-13 NOTE — Patient Instructions (Signed)
-   continue eliquis, ASA and crestor for stroke prevention - follow up with cardiology for afib management - Follow up with your primary care physician for stroke risk factor modification. Recommend maintain blood pressure goal <140/80, diabetes with hemoglobin A1c goal below 6.5% and lipids with LDL cholesterol goal below 70 mg/dL.  - check BP at home and record - follow up with pain management for back pain - follow up in 4 months.

## 2015-09-17 ENCOUNTER — Telehealth: Payer: Self-pay | Admitting: Cardiology

## 2015-09-17 NOTE — Telephone Encounter (Signed)
LEFT MESSAGE ON DAUGHTER'S VOICE MAIL. LEFT MESSAGE  GRANDSON JOSH- PATIENT WAS RESTING INFORMED PATIENT CAN TAKE AN EXTRA 1/2 TABLET OF METOPROLOL FOR NEXT 3 DAYS THEN RETURN BACK TO REGULAR DOSE. WILL NOTIFY DR HARDING.

## 2015-09-17 NOTE — Telephone Encounter (Signed)
Pt's daughter called in stating that for the past 3 days ,whenever the patient goes to stand up, her heart seems to start racing and does not regulate itself until she sits down. 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   Thanks

## 2015-09-17 NOTE — Telephone Encounter (Signed)
Agree with this plan. Probably back in Afib.  Bryan Lemmaavid Cam Dauphin, MD

## 2015-09-18 ENCOUNTER — Encounter (HOSPITAL_COMMUNITY): Payer: Self-pay

## 2015-09-18 ENCOUNTER — Emergency Department (HOSPITAL_COMMUNITY): Payer: Medicare HMO

## 2015-09-18 ENCOUNTER — Inpatient Hospital Stay (HOSPITAL_COMMUNITY)
Admission: EM | Admit: 2015-09-18 | Discharge: 2015-09-23 | DRG: 308 | Disposition: A | Discharge status: Home / Self Care | Payer: Medicare HMO | Attending: Internal Medicine | Admitting: Internal Medicine

## 2015-09-18 ENCOUNTER — Other Ambulatory Visit: Payer: Self-pay

## 2015-09-18 DIAGNOSIS — Z8249 Family history of ischemic heart disease and other diseases of the circulatory system: Secondary | ICD-10-CM

## 2015-09-18 DIAGNOSIS — I5030 Unspecified diastolic (congestive) heart failure: Secondary | ICD-10-CM | POA: Diagnosis present

## 2015-09-18 DIAGNOSIS — J449 Chronic obstructive pulmonary disease, unspecified: Secondary | ICD-10-CM | POA: Diagnosis present

## 2015-09-18 DIAGNOSIS — Z7901 Long term (current) use of anticoagulants: Secondary | ICD-10-CM

## 2015-09-18 DIAGNOSIS — J90 Pleural effusion, not elsewhere classified: Secondary | ICD-10-CM | POA: Diagnosis present

## 2015-09-18 DIAGNOSIS — Z87891 Personal history of nicotine dependence: Secondary | ICD-10-CM | POA: Diagnosis not present

## 2015-09-18 DIAGNOSIS — Z66 Do not resuscitate: Secondary | ICD-10-CM | POA: Diagnosis present

## 2015-09-18 DIAGNOSIS — E039 Hypothyroidism, unspecified: Secondary | ICD-10-CM | POA: Diagnosis present

## 2015-09-18 DIAGNOSIS — G8929 Other chronic pain: Secondary | ICD-10-CM | POA: Diagnosis present

## 2015-09-18 DIAGNOSIS — Z8673 Personal history of transient ischemic attack (TIA), and cerebral infarction without residual deficits: Secondary | ICD-10-CM | POA: Diagnosis not present

## 2015-09-18 DIAGNOSIS — Z79899 Other long term (current) drug therapy: Secondary | ICD-10-CM

## 2015-09-18 DIAGNOSIS — J189 Pneumonia, unspecified organism: Secondary | ICD-10-CM | POA: Diagnosis present

## 2015-09-18 DIAGNOSIS — I252 Old myocardial infarction: Secondary | ICD-10-CM

## 2015-09-18 DIAGNOSIS — Z823 Family history of stroke: Secondary | ICD-10-CM | POA: Diagnosis not present

## 2015-09-18 DIAGNOSIS — Z7982 Long term (current) use of aspirin: Secondary | ICD-10-CM | POA: Diagnosis not present

## 2015-09-18 DIAGNOSIS — Z951 Presence of aortocoronary bypass graft: Secondary | ICD-10-CM | POA: Diagnosis not present

## 2015-09-18 DIAGNOSIS — I739 Peripheral vascular disease, unspecified: Secondary | ICD-10-CM | POA: Diagnosis present

## 2015-09-18 DIAGNOSIS — I255 Ischemic cardiomyopathy: Secondary | ICD-10-CM | POA: Diagnosis present

## 2015-09-18 DIAGNOSIS — E785 Hyperlipidemia, unspecified: Secondary | ICD-10-CM | POA: Diagnosis present

## 2015-09-18 DIAGNOSIS — I110 Hypertensive heart disease with heart failure: Secondary | ICD-10-CM | POA: Diagnosis present

## 2015-09-18 DIAGNOSIS — I4891 Unspecified atrial fibrillation: Secondary | ICD-10-CM | POA: Diagnosis not present

## 2015-09-18 DIAGNOSIS — M545 Low back pain: Secondary | ICD-10-CM | POA: Diagnosis present

## 2015-09-18 DIAGNOSIS — I272 Other secondary pulmonary hypertension: Secondary | ICD-10-CM | POA: Diagnosis present

## 2015-09-18 DIAGNOSIS — E873 Alkalosis: Secondary | ICD-10-CM | POA: Diagnosis not present

## 2015-09-18 DIAGNOSIS — I482 Chronic atrial fibrillation: Secondary | ICD-10-CM | POA: Diagnosis present

## 2015-09-18 DIAGNOSIS — E079 Disorder of thyroid, unspecified: Secondary | ICD-10-CM | POA: Diagnosis not present

## 2015-09-18 DIAGNOSIS — I2510 Atherosclerotic heart disease of native coronary artery without angina pectoris: Secondary | ICD-10-CM | POA: Diagnosis present

## 2015-09-18 DIAGNOSIS — I5033 Acute on chronic diastolic (congestive) heart failure: Secondary | ICD-10-CM | POA: Diagnosis present

## 2015-09-18 HISTORY — DX: Chronic kidney disease, unspecified: N18.9

## 2015-09-18 LAB — TROPONIN I: Troponin I: <0.03 ng/mL (ref <0.031)

## 2015-09-18 LAB — BASIC METABOLIC PANEL
Anion gap: 9 (ref 5–15)
BUN: 13 mg/dL (ref 6–20)
CHLORIDE: 99 mmol/L — AB (ref 101–111)
CO2: 26 mmol/L (ref 22–32)
CREATININE: 0.9 mg/dL (ref 0.44–1.00)
Calcium: 8.6 mg/dL — ABNORMAL LOW (ref 8.9–10.3)
GFR calc Af Amer: >60 mL/min (ref >60)
GFR calc non Af Amer: >60 mL/min (ref >60)
GLUCOSE: 138 mg/dL — AB (ref 65–99)
Potassium: 4.6 mmol/L (ref 3.5–5.1)
SODIUM: 134 mmol/L — AB (ref 135–145)

## 2015-09-18 LAB — CBC
HCT: 41.1 % (ref 36.0–46.0)
HEMOGLOBIN: 11.5 g/dL — AB (ref 12.0–15.0)
MCH: 21.5 pg — AB (ref 26.0–34.0)
MCHC: 28 g/dL — ABNORMAL LOW (ref 30.0–36.0)
MCV: 77 fL — AB (ref 78.0–100.0)
Platelets: 325 10*3/uL (ref 150–400)
RBC: 5.34 MIL/uL — AB (ref 3.87–5.11)
RDW: 18.2 % — ABNORMAL HIGH (ref 11.5–15.5)
WBC: 10.9 10*3/uL — AB (ref 4.0–10.5)

## 2015-09-18 LAB — T4, FREE: FREE T4: 1.63 ng/dL — AB (ref 0.61–1.12)

## 2015-09-18 LAB — TSH: TSH: 1.314 u[IU]/mL (ref 0.350–4.500)

## 2015-09-18 LAB — MAGNESIUM: MAGNESIUM: 1.9 mg/dL (ref 1.7–2.4)

## 2015-09-18 LAB — DIGOXIN LEVEL

## 2015-09-18 MED ORDER — DEXTROSE 5 % IV SOLN
2.0000 g | Freq: Once | INTRAVENOUS | Status: AC
  Administered 2015-09-19: 2 g via INTRAVENOUS
  Filled 2015-09-18: qty 2

## 2015-09-18 MED ORDER — LUBIPROSTONE 24 MCG PO CAPS
24.0000 ug | ORAL_CAPSULE | Freq: Every evening | ORAL | Status: DC
  Administered 2015-09-18 – 2015-09-22 (×4): 24 ug via ORAL
  Filled 2015-09-18 (×6): qty 1

## 2015-09-18 MED ORDER — ROSUVASTATIN CALCIUM 10 MG PO TABS
5.0000 mg | ORAL_TABLET | Freq: Every evening | ORAL | Status: DC
  Administered 2015-09-18 – 2015-09-22 (×5): 5 mg via ORAL
  Filled 2015-09-18 (×5): qty 1

## 2015-09-18 MED ORDER — OXYCODONE-ACETAMINOPHEN 5-325 MG PO TABS
1.0000 | ORAL_TABLET | Freq: Four times a day (QID) | ORAL | Status: DC | PRN
  Administered 2015-09-18 – 2015-09-21 (×7): 1 via ORAL
  Filled 2015-09-18 (×7): qty 1

## 2015-09-18 MED ORDER — SODIUM CHLORIDE 0.9% FLUSH
3.0000 mL | Freq: Two times a day (BID) | INTRAVENOUS | Status: DC
  Administered 2015-09-18 – 2015-09-23 (×10): 3 mL via INTRAVENOUS

## 2015-09-18 MED ORDER — DEXTROSE 5 % IV SOLN
500.0000 mg | Freq: Once | INTRAVENOUS | Status: AC
  Administered 2015-09-19: 500 mg via INTRAVENOUS
  Filled 2015-09-18: qty 500

## 2015-09-18 MED ORDER — DILTIAZEM LOAD VIA INFUSION
20.0000 mg | Freq: Once | INTRAVENOUS | Status: AC
  Administered 2015-09-18: 20 mg via INTRAVENOUS
  Filled 2015-09-18: qty 20

## 2015-09-18 MED ORDER — METOPROLOL TARTRATE 50 MG PO TABS
50.0000 mg | ORAL_TABLET | Freq: Two times a day (BID) | ORAL | Status: DC
  Administered 2015-09-18 (×2): 50 mg via ORAL
  Filled 2015-09-18: qty 1
  Filled 2015-09-18: qty 2

## 2015-09-18 MED ORDER — DEXTROSE 5 % IV SOLN
1.0000 g | Freq: Once | INTRAVENOUS | Status: AC
  Administered 2015-09-18: 1 g via INTRAVENOUS
  Filled 2015-09-18: qty 10

## 2015-09-18 MED ORDER — ASPIRIN EC 81 MG PO TBEC
81.0000 mg | DELAYED_RELEASE_TABLET | Freq: Every day | ORAL | Status: DC
  Administered 2015-09-19: 81 mg via ORAL
  Filled 2015-09-18: qty 1

## 2015-09-18 MED ORDER — METOPROLOL TARTRATE 25 MG PO TABS: 25.0000 mg | ORAL_TABLET | Freq: Two times a day (BID) | ORAL | Status: DC

## 2015-09-18 MED ORDER — DEXTROSE 5 % IV SOLN
500.0000 mg | Freq: Once | INTRAVENOUS | Status: AC
  Administered 2015-09-18: 500 mg via INTRAVENOUS
  Filled 2015-09-18: qty 500

## 2015-09-18 MED ORDER — SENNOSIDES-DOCUSATE SODIUM 8.6-50 MG PO TABS
1.0000 | ORAL_TABLET | Freq: Every evening | ORAL | Status: DC | PRN
  Administered 2015-09-21: 1 via ORAL
  Filled 2015-09-18: qty 1

## 2015-09-18 MED ORDER — OXYBUTYNIN CHLORIDE 5 MG PO TABS: 5.0000 mg | ORAL_TABLET | Freq: Two times a day (BID) | ORAL | Status: DC | PRN

## 2015-09-18 MED ORDER — DILTIAZEM HCL 100 MG IV SOLR
5.0000 mg/h | INTRAVENOUS | Status: DC
  Administered 2015-09-18: 5 mg/h via INTRAVENOUS
  Filled 2015-09-18: qty 100

## 2015-09-18 MED ORDER — LEVOTHYROXINE SODIUM 75 MCG PO TABS
75.0000 ug | ORAL_TABLET | Freq: Every day | ORAL | Status: DC
  Administered 2015-09-19: 75 ug via ORAL
  Filled 2015-09-18: qty 1

## 2015-09-18 MED ORDER — APIXABAN 5 MG PO TABS
5.0000 mg | ORAL_TABLET | Freq: Two times a day (BID) | ORAL | Status: DC
  Administered 2015-09-18 – 2015-09-23 (×10): 5 mg via ORAL
  Filled 2015-09-18 (×10): qty 1

## 2015-09-18 NOTE — ED Provider Notes (Addendum)
CSN: [id number]     Arrival date & time 09/18/15  0347 History   First MD Initiated Contact with Patient 09/18/15 [phone]     Chief Complaint  Patient presents with  . Palpitations  . Emesis    HPI  Patient presents with concern feeling generally poor. Illnesses been present for at least 3 days, worsening over the past day. There is associated chest tightness, dyspnea, fatigue, nausea, anorexia, but no vomiting, no fever, no cough. Patient has been taking with her physician, has attempted to change her beta blocker medication, without change in her condition. She notes that she is compliant with all other medication. She denies confusion, disorientation.   Past Medical History  Diagnosis Date  . S/P CABG x 2 2009    LIMA-LAD, SVG-RCA, with AV fistula ligation and Maze procedure  . CAD (coronary artery disease), native coronary artery 2009    75% LAD, diffuse 75-90% RCA --> Referred for CABG + MAZE;  Cardiolite 12/2013: No ischemia or Infarction  . Ischemic cardiomyopathy 2012    EF ~40-45% by Echo  . Persistent atrial fibrillation (HCC) 04/05/2012    Now permanent A. fib s/p MAZE -- recurrence, cardioversion-converted to sinus bradycardia --> Now persistent  . Essential hypertension   . Dyslipidemia, goal LDL below 70     On Crestor, followed by PCP  . COPD (chronic obstructive pulmonary disease) (HCC)   . PAD (peripheral artery disease) (HCC) 2007    s/p L Ileac A stent ; most recent Dopplers July 2012: Less than 50% reduction bilaterally. ABI 0.96 on the right 0.88 on left;;;12/27/2011   -ABI right .87 and left ABI .78  ,LEFT CIA and EIA stent normall patency, left CFA,SFA,and popliteal 0-49%; rgt proximal SFA 50-69%,rgt CIA,EIA, and CFA 0-49%  . Chronic low back pain     scoliosis & lordosis  . H/O: pneumonia   . Hypothyroidism   . Anxiety   . Cervical neck pain with evidence of disc disease     with need for surgery -- November 2015  . Osteoarthritis of back     And neck,  hands,spine  . H/O ischemic left MCA stroke 11/18/2014    MRI/MRA of head: Multifocal left MCA infarct.. Also possible left ACA territory infarct -- complete occlusion of left MCA distally at M2 level. Marked left ACA attenuation.  Marland Kitchen TIA (transient ischemic attack) 04/05/2015  . Pulmonary hypertension (HCC)     Estimated PA pressure on Echo January 2017 = 59 mmHg with dilated IVC, severely dilated RA and moderate TR.  . Stroke [facility])    Past Surgical History  Procedure Laterality Date  . Iliac artery stent Left 04/06/2005    Ex Iliac - CFA (Smart STENTS -- 7x4 in EIA, 6 x 3 CFA)   . Coronary artery bypass graft  2009    LIMA-LAD, SVG-RCA (also MAZE & AV fistula ligation)  . Maze  2009    along with CABG  . Cardioversion  04/05/2012    Procedure: CARDIOVERSION;  Surgeon: Thurmon Fair, MD;  Location: [facility] ENDOSCOPY;  Service: Cardiovascular;  Laterality: N/A;  . Transthoracic echocardiogram  Jan 2012    EF ~40-45%, global HK; PAP ~45-50 mmHg  . Nm myoview ltd  May 2013    No ischemia or Infarct  . Abdominal hysterectomy    . Cardiac catheterization  07/30/2007    75% LAD ,3 stenoses of 75-90% in  RCA;circumflex from proximal RCA with no lesion seen,normal ramus intermediate branh; normal LV systoilc  function  . Nm cardiolite ltd  12/2013    Non-gated for Afib; No Ischemia or Infarction.  Marland Kitchen Anterior cervical decomp/discectomy fusion N/A 01/23/2014    Procedure: ANTERIOR CERVICAL DECOMPRESSION/DISCECTOMY FUSION CERVICAL FOUR-FIVE,CERVICAL FIVE-SIX,CERVICAL SIX-SEVEN;  Surgeon: Mariam Dollar, MD;  Location: [facility] NEURO ORS;  Service: Neurosurgery;  Laterality: N/A;  . Colonoscopy with propofol N/A 11/12/2014    Procedure: COLONOSCOPY WITH PROPOFOL;  Surgeon: Charna Elizabeth, MD;  Location: [facility] ENDOSCOPY;  Service: Endoscopy;  Laterality: N/A;  . Transthoracic echocardiogram  Jan 2017    EF ~50%. No RWMA, Afib. Paradoxical Septal motion. no DD assessment. Mod LA dilation.  PAP ~59 mmHg & dilated IVC. Mod  TR. Severe RA dilation.   Family History  Problem Relation Age of Onset  . Heart attack Mother   . Stroke Mother   . Stroke Father   . Heart attack Father   . Heart disease Brother 51  . Stroke Sister    Social History  Substance Use Topics  . Smoking status: Former Smoker -- 0.50 packs/day    Types: Cigarettes    Quit date: 01/07/1998  . Smokeless tobacco: Never Used  . Alcohol Use: No   OB History    No data available     Review of Systems  Constitutional:       Per HPI, otherwise negative  HENT:       Per HPI, otherwise negative  Respiratory:       Per HPI, otherwise negative  Cardiovascular:       Per HPI, otherwise negative  Gastrointestinal: Positive for nausea. Negative for vomiting.  Endocrine:       Negative aside from HPI  Genitourinary:       Neg aside from HPI   Musculoskeletal:       Per HPI, otherwise negative  Skin: Negative.   Neurological: Positive for weakness. Negative for syncope.      Allergies  Review of patient's allergies indicates no known allergies.  Home Medications   Prior to Admission medications   Medication Sig Start Date End Date Taking? Authorizing Provider  AMITIZA 24 MCG capsule Take 24 mcg by mouth every evening.  03/17/15   Historical Provider, MD  apixaban (ELIQUIS) 5 MG TABS tablet Take 1 tablet (5 mg total) by mouth 2 (two) times daily. 11/19/14   Joseph Art, DO  aspirin EC 81 MG EC tablet Take 1 tablet (81 mg total) by mouth daily. 11/19/14   Joseph Art, DO  Cholecalciferol (VITAMIN D3) 2000 units TABS Take 1 tablet by mouth daily.    Historical Provider, MD  digoxin (LANOXIN) 0.125 MG tablet Take 1 tablet by mouth every morning.  08/14/15   Historical Provider, MD  levothyroxine (SYNTHROID, LEVOTHROID) 75 MCG tablet Take 75 mcg by mouth daily before breakfast.    Historical Provider, MD  metoprolol (LOPRESSOR) 50 MG tablet Take 25 mg by mouth 2 (two) times daily. 08/29/15   Historical Provider, MD  Multiple  Vitamins-Minerals (PRESERVISION AREDS 2) CAPS Take 1 capsule by mouth 2 (two) times daily.    Historical Provider, MD  oxybutynin (DITROPAN) 5 MG tablet Take 5 mg by mouth 2 (two) times daily as needed for bladder spasms.    Historical Provider, MD  oxyCODONE-acetaminophen (PERCOCET/ROXICET) 5-325 MG tablet  08/19/15   Historical Provider, MD  rosuvastatin (CRESTOR) 5 MG tablet TAKE 1 TABLET BY MOUTH IN THE EVENING 06/17/15   Marykay Lex, MD  senna-docusate (SENOKOT-S) 8.6-50 MG tablet Take 1  tablet by mouth at bedtime as needed for mild constipation. 04/09/15   Richarda OverlieNayana Abrol, MD   BP 126/89 mmHg  Pulse 148  Temp(Src) 98 F (36.7 C) (Oral)  Resp 20  Ht 5' (1.524 m)  Wt 90 lb (40.824 kg)  BMI 17.58 kg/m2  SpO2 91% Physical Exam  Constitutional: She is oriented to person, place, and time. She appears well-developed and well-nourished. No distress.  HENT:  Head: Normocephalic and atraumatic.  Eyes: Conjunctivae and EOM are normal.  Cardiovascular: An irregularly irregular rhythm present. Tachycardia present.   Pulmonary/Chest: Effort normal. No stridor. No respiratory distress. She has decreased breath sounds.  Abdominal: She exhibits no distension.  Musculoskeletal: She exhibits no edema.  Neurological: She is alert and oriented to person, place, and time. No cranial nerve deficit.  Skin: Skin is warm and dry.  Psychiatric: She has a normal mood and affect.  Nursing note and vitals reviewed.   ED Course  Procedures (including critical care time) Labs Review Labs Reviewed  BASIC METABOLIC PANEL - Abnormal; Notable for the following:    Sodium 134 (*)    Chloride 99 (*)    Glucose, Bld 138 (*)    Calcium 8.6 (*)    All other components within normal limits  CBC - Abnormal; Notable for the following:    WBC 10.9 (*)    RBC 5.34 (*)    Hemoglobin 11.5 (*)    MCV 77.0 (*)    MCH 21.5 (*)    MCHC 28.0 (*)    RDW 18.2 (*)    All other components within normal limits  DIGOXIN  LEVEL  TSH  T4, FREE    Imaging Review Dg Chest 2 View  09/18/2015  CLINICAL DATA:  Cardiac palpitations. EXAM: CHEST  2 VIEW COMPARISON:  Radiograph of September 11, 2015. FINDINGS: Stable cardiomegaly. Status post coronary artery bypass graft. No pneumothorax is noted. Increased left basilar opacity is noted concerning for pneumonia or atelectasis with associated pleural effusion. Stable mild right pleural effusion is noted with associated subsegmental atelectasis. Bony thorax is unremarkable. IMPRESSION: Increased left basilar opacity is noted concerning for worsening pneumonia or atelectasis with associated pleural effusion. Mild right pleural effusion is noted with associated subsegmental atelectasis. Electronically Signed   By: Lupita RaiderJames  Green Jr, M.D.   On: 09/18/2015 09:47   I have personally reviewed and evaluated these images and lab results as part of my medical decision-making.  EKG with atrial fibrillation, rapid ventricular response, 148 abnormal On cardiac monitor the patient similarly has atrial fibrillation, rapid ventricular response, variable rate 140/150 abnormal  With concern for major fibrillation, rapid ventricular response, generalized illness, the patient was started on a diltiazem drip after initiation of bolus.    10:18 AM Patient had cardioversion after a bolus of diltiazem, 20 mg, and initiation of drip. Sinus rhythm, unremarkable.   With x-ray concerning for pleural effusion, possible pneumonia the patient will start antibiotics, be admitted.  On discussion of findings, patient states that she has been informed of possible pulmonary disease, though she has no formal diagnosis of COPD, emphysema, asthma.   Update: Heart rate remains sinus, normal rhythm and rate.  Blood pressure improving after cessation of recent drip MDM  Elderly female with multiple medical issues including atrial fibrillation, now presents with generalized illness, and on initial exam is in  atrial fibrillation with rapid ventricular response. This improves with diltiazem. However, the patient is also found to have findings concerning for pneumonia, left pleural effusion.  Patient was started on antibiotics, required admission for further evaluation, management.   Gerhard Munch, MD 09/18/15 1021  CRITICAL CARE Performed by: Gerhard Munch Total critical care time: 40 minutes Critical care time was exclusive of separately billable procedures and treating other patients. Critical care was necessary to treat or prevent imminent or life-threatening deterioration. Critical care was time spent personally by me on the following activities: development of treatment plan with patient and/or surrogate as well as nursing, discussions with consultants, evaluation of patient's response to treatment, examination of patient, obtaining history from patient or surrogate, ordering and performing treatments and interventions, ordering and review of laboratory studies, ordering and review of radiographic studies, pulse oximetry and re-evaluation of patient's condition.   Gerhard Munch, MD 09/18/15 [phone]

## 2015-09-18 NOTE — ED Notes (Signed)
Pt O2 saturation 85% on RA. Pt placed on 2L nasal cannula, O2 up to 95%.

## 2015-09-18 NOTE — H&P (Signed)
Date: 09/18/2015               Patient Name:  [name] MRN: [id number]  DOB: 1941-03-05 Age / Sex: 75 y.o., female   PCP: [name], MD         Medical Service: Internal Medicine Teaching Service         Attending Physician: Dr. Earl Lagos, MD    First Contact: Dr. Darreld Mclean Pager: [phone]  Second Contact: Dr. Heywood Iles Pager: [phone]       After Hours (After 5p/  First Contact Pager: [phone]  weekends / holidays): Second Contact Pager: [phone]   Chief Complaint: "I'm sick"  History of Present Illness:  Ms. [name] is a 75 year old [place] woman with PMH of 2 vessel CAD s/p CABG, Permanent Atrial Fibrillation on Eliquis, ICM (EF 50 % on TTE 04/06/15), Pulm HTN (PA peak pressuer 59 mmHg), PAD, CVA, COPD Gold Stage 3 (FEV1 45%, FEV1/FVC 56% in 2013), HTN, and HLD who presents with palpitations. Patient states she was at her usual state of health yesterday, resting at home, when she suddenly felt nauseous. She had dry heaves, but no vomiting. She felt her heart racing and skipping beats. This felt similar to her prior episodes of her atrial fibrillation attacks. The sensation subsided on its own after about 10-15 minutes. Her daughter called their Cardiologist's office (Dr. Herbie Baltimore) who recommended that patient take an extra 1/2 tablet of her metoprolol over the next 3 days. She has also been on Digoxin, which was discontinued per Cardiology note from April, but this has apparently been restarted. Patient was doing well until about 4 am this morning when her symptoms returned. She also reports a two day history of cough productive of dark sputum. At this time, they decided to come to the ED. She denied any chest pain, shortness of breath, fevers, chills, diaphoresis, or syncope.  In the ED, patient was noted to be in Afib with RVR with rate ~140-150 bpm. Patient was started on a diltiazem drip after initial bolus. She had cardioversion on drip and this was discontinued.  Digoxin level was <0.2, TSH 1.314, free T4 1.63, white count 10.9. CXR showed a left sided basilar opacity concerning for pneumonia or atelectasis with pleural effusion. She was started on IV Ceftriaxone and Azithromycin.  Social Hx: former smoker 5 cigarettes/day, quit 18 years ago; no current alcohol, occasional use in past; no illicit drug use  Family Hx: Mother with MI and CVA, Father MI in 44s, Brother with "large heart"  Meds: Current Facility-Administered Medications  Medication Dose Route Frequency Provider Last Rate Last Dose  . [START ON 09/19/2015] azithromycin (ZITHROMAX) 500 mg in dextrose 5 % 250 mL IVPB  500 mg Intravenous Once Darreld Mclean, MD      . Melene Muller ON 09/19/2015] cefTRIAXone (ROCEPHIN) 2 g in dextrose 5 % 50 mL IVPB  2 g Intravenous Once Darreld Mclean, MD      . metoprolol tartrate (LOPRESSOR) tablet 50 mg  50 mg Oral BID Beather Arbour, MD   50 mg at 09/18/15 1359  . oxyCODONE-acetaminophen (PERCOCET/ROXICET) 5-325 MG per tablet 1 tablet  1 tablet Oral Q6H PRN Beather Arbour, MD   1 tablet at 09/18/15 1515   Current Outpatient Prescriptions  Medication Sig Dispense Refill  . AMITIZA 24 MCG capsule Take 24 mcg by mouth every evening.     Marland Kitchen apixaban (ELIQUIS) 5 MG TABS tablet Take 1 tablet (5 mg total)  by mouth 2 (two) times daily. 60 tablet 0  . aspirin EC 81 MG EC tablet Take 1 tablet (81 mg total) by mouth daily. 30 tablet 0  . Cholecalciferol (VITAMIN D3) 2000 units TABS Take 1 tablet by mouth daily.    . digoxin (LANOXIN) 0.125 MG tablet Take 1 tablet by mouth every morning.     Marland Kitchen levothyroxine (SYNTHROID, LEVOTHROID) 75 MCG tablet Take 75 mcg by mouth daily before breakfast.    . metoprolol (LOPRESSOR) 50 MG tablet Take 25 mg by mouth 2 (two) times daily.    . Multiple Vitamins-Minerals (PRESERVISION AREDS 2) CAPS Take 1 capsule by mouth 2 (two) times daily.    Marland Kitchen oxybutynin (DITROPAN) 5 MG tablet Take 5 mg by mouth 2 (two) times daily as needed for bladder spasms.     Marland Kitchen oxyCODONE-acetaminophen (PERCOCET/ROXICET) 5-325 MG tablet     . rosuvastatin (CRESTOR) 5 MG tablet TAKE 1 TABLET BY MOUTH IN THE EVENING 30 tablet 2  . senna-docusate (SENOKOT-S) 8.6-50 MG tablet Take 1 tablet by mouth at bedtime as needed for mild constipation. 30 tablet 0    Allergies: Allergies as of 09/18/2015  . (No Known Allergies)   Past Medical History  Diagnosis Date  . S/P CABG x 2 2009    LIMA-LAD, SVG-RCA, with AV fistula ligation and Maze procedure  . CAD (coronary artery disease), native coronary artery 2009    75% LAD, diffuse 75-90% RCA --> Referred for CABG + MAZE;  Cardiolite 12/2013: No ischemia or Infarction  . Ischemic cardiomyopathy 2012    EF ~40-45% by Echo  . Persistent atrial fibrillation (HCC) 04/05/2012    Now permanent A. fib s/p MAZE -- recurrence, cardioversion-converted to sinus bradycardia --> Now persistent  . Essential hypertension   . Dyslipidemia, goal LDL below 70     On Crestor, followed by PCP  . COPD (chronic obstructive pulmonary disease) (HCC)   . PAD (peripheral artery disease) (HCC) 2007    s/p L Ileac A stent ; most recent Dopplers July 2012: Less than 50% reduction bilaterally. ABI 0.96 on the right 0.88 on left;;;12/27/2011   -ABI right .87 and left ABI .78  ,LEFT CIA and EIA stent normall patency, left CFA,SFA,and popliteal 0-49%; rgt proximal SFA 50-69%,rgt CIA,EIA, and CFA 0-49%  . Chronic low back pain     scoliosis & lordosis  . H/O: pneumonia   . Hypothyroidism   . Anxiety   . Cervical neck pain with evidence of disc disease     with need for surgery -- November 2015  . Osteoarthritis of back     And neck, hands,spine  . H/O ischemic left MCA stroke 11/18/2014    MRI/MRA of head: Multifocal left MCA infarct.. Also possible left ACA territory infarct -- complete occlusion of left MCA distally at M2 level. Marked left ACA attenuation.  Marland Kitchen TIA (transient ischemic attack) 04/05/2015  . Pulmonary hypertension (HCC)     Estimated  PA pressure on Echo January 2017 = 59 mmHg with dilated IVC, severely dilated RA and moderate TR.  . Stroke [facility])    Past Surgical History  Procedure Laterality Date  . Iliac artery stent Left 04/06/2005    Ex Iliac - CFA (Smart STENTS -- 7x4 in EIA, 6 x 3 CFA)   . Coronary artery bypass graft  2009    LIMA-LAD, SVG-RCA (also MAZE & AV fistula ligation)  . Maze  2009    along with CABG  . Cardioversion  04/05/2012  Procedure: CARDIOVERSION;  Surgeon: Thurmon Fair, MD;  Location: [facility] ENDOSCOPY;  Service: Cardiovascular;  Laterality: N/A;  . Transthoracic echocardiogram  Jan 2012    EF ~40-45%, global HK; PAP ~45-50 mmHg  . Nm myoview ltd  May 2013    No ischemia or Infarct  . Abdominal hysterectomy    . Cardiac catheterization  07/30/2007    75% LAD ,3 stenoses of 75-90% in  RCA;circumflex from proximal RCA with no lesion seen,normal ramus intermediate branh; normal LV systoilc function  . Nm cardiolite ltd  12/2013    Non-gated for Afib; No Ischemia or Infarction.  Marland Kitchen Anterior cervical decomp/discectomy fusion N/A 01/23/2014    Procedure: ANTERIOR CERVICAL DECOMPRESSION/DISCECTOMY FUSION CERVICAL FOUR-FIVE,CERVICAL FIVE-SIX,CERVICAL SIX-SEVEN;  Surgeon: Mariam Dollar, MD;  Location: [facility] NEURO ORS;  Service: Neurosurgery;  Laterality: N/A;  . Colonoscopy with propofol N/A 11/12/2014    Procedure: COLONOSCOPY WITH PROPOFOL;  Surgeon: Charna Elizabeth, MD;  Location: [facility] ENDOSCOPY;  Service: Endoscopy;  Laterality: N/A;  . Transthoracic echocardiogram  Jan 2017    EF ~50%. No RWMA, Afib. Paradoxical Septal motion. no DD assessment. Mod LA dilation.  PAP ~59 mmHg & dilated IVC. Mod TR. Severe RA dilation.   Family History  Problem Relation Age of Onset  . Heart attack Mother     Late 84s  . Stroke Mother   . Stroke Father   . Heart attack Father     5s  . Heart disease Brother 60    Cardiomegaly  . Stroke Sister   . Heart attack Son 8   Social History   Social History  . Marital Status:  Widowed    Spouse Name: N/A  . Number of Children: N/A  . Years of Education: N/A   Occupational History  . Not on file.   Social History Main Topics  . Smoking status: Former Smoker -- 0.50 packs/day    Types: Cigarettes    Quit date: 01/07/1998  . Smokeless tobacco: Never Used  . Alcohol Use: No  . Drug Use: No  . Sexual Activity: Not on file   Other Topics Concern  . Not on file   Social History Narrative    She is a divorced mother of 1 and one child who died. Her daughter is here with her   today. She has got 4 grandchildren. She is a native of [place] and only has an occasional alcoholic   beverage. She does not get routine exercise mostly because of her spine pain. She did previously smoke but quit many years ago.     Review of Systems: Review of Systems  Constitutional: Negative for fever, chills and diaphoresis.  Respiratory: Positive for cough. Negative for hemoptysis and shortness of breath.        Occasional dark sputum  Cardiovascular: Positive for palpitations. Negative for chest pain, orthopnea and leg swelling.  Gastrointestinal: Positive for nausea and constipation. Negative for vomiting, abdominal pain, diarrhea, blood in stool and melena.  Genitourinary: Positive for frequency. Negative for dysuria and hematuria.  Musculoskeletal: Positive for myalgias and back pain. Negative for falls.  Neurological: Negative for dizziness, tingling, focal weakness and loss of consciousness.  Psychiatric/Behavioral: Negative for substance abuse. The patient is nervous/anxious.        Some memory deficit since prior CVA     Physical Exam: Blood pressure 104/74, pulse 77, temperature 98 F (36.7 C), temperature source Oral, resp. rate 13, height 5' (1.524 m), weight 90 lb (40.824 kg), SpO2 100 %. Physical  Exam  Constitutional: She is oriented to person, place, and time.  Thin woman, pleasant, intermittently anxious  HENT:  Head: Normocephalic and atraumatic.    Mouth/Throat: Oropharynx is clear and moist.  Eyes: EOM are normal. Pupils are equal, round, and reactive to light.  Cardiovascular: Intact distal pulses.   Tachycardic, irregularly irregular. Sternotomy scar.  Pulmonary/Chest: Effort normal. No respiratory distress. She has no wheezes.  Slight dullness to percussion left lung base, bibasilar crackles heard  Abdominal: Soft. Bowel sounds are normal. She exhibits no distension. There is no tenderness.  Musculoskeletal: Normal range of motion. She exhibits no edema or tenderness.  Neurological: She is alert and oriented to person, place, and time.  Skin: Skin is warm. She is not diaphoretic.  Psychiatric: She has a normal mood and affect.     Lab results: Basic Metabolic Panel:  Recent Labs  40/98/11 0856  NA 134*  K 4.6  CL 99*  CO2 26  GLUCOSE 138*  BUN 13  CREATININE 0.90  CALCIUM 8.6*  MG 1.9   Liver Function Tests: No results for input(s): AST, ALT, ALKPHOS, BILITOT, PROT, ALBUMIN in the last 72 hours. No results for input(s): LIPASE, AMYLASE in the last 72 hours. No results for input(s): AMMONIA in the last 72 hours. CBC:  Recent Labs  09/18/15 0856  WBC 10.9*  HGB 11.5*  HCT 41.1  MCV 77.0*  PLT 325   Cardiac Enzymes: No results for input(s): CKTOTAL, CKMB, CKMBINDEX, TROPONINI in the last 72 hours. BNP: No results for input(s): PROBNP in the last 72 hours. D-Dimer: No results for input(s): DDIMER in the last 72 hours. CBG: No results for input(s): GLUCAP in the last 72 hours. Hemoglobin A1C: No results for input(s): HGBA1C in the last 72 hours. Fasting Lipid Panel: No results for input(s): CHOL, HDL, LDLCALC, TRIG, CHOLHDL, LDLDIRECT in the last 72 hours. Thyroid Function Tests:  Recent Labs  09/18/15 0856  TSH 1.314  FREET4 1.63*   Anemia Panel: No results for input(s): VITAMINB12, FOLATE, FERRITIN, TIBC, IRON, RETICCTPCT in the last 72 hours. Coagulation: No results for input(s): LABPROT,  INR in the last 72 hours. Urine Drug Screen: Drugs of Abuse  No results found for: LABOPIA, COCAINSCRNUR, LABBENZ, AMPHETMU, THCU, LABBARB  Alcohol Level: No results for input(s): ETH in the last 72 hours. Urinalysis: No results for input(s): COLORURINE, LABSPEC, PHURINE, GLUCOSEU, HGBUR, BILIRUBINUR, KETONESUR, PROTEINUR, UROBILINOGEN, NITRITE, LEUKOCYTESUR in the last 72 hours.  Invalid input(s): APPERANCEUR  Imaging results:  Dg Chest 2 View  09/18/2015  CLINICAL DATA:  Cardiac palpitations. EXAM: CHEST  2 VIEW COMPARISON:  Radiograph of September 11, 2015. FINDINGS: Stable cardiomegaly. Status post coronary artery bypass graft. No pneumothorax is noted. Increased left basilar opacity is noted concerning for pneumonia or atelectasis with associated pleural effusion. Stable mild right pleural effusion is noted with associated subsegmental atelectasis. Bony thorax is unremarkable. IMPRESSION: Increased left basilar opacity is noted concerning for worsening pneumonia or atelectasis with associated pleural effusion. Mild right pleural effusion is noted with associated subsegmental atelectasis. Electronically Signed   By: Lupita Raider, M.D.   On: 09/18/2015 09:47    Other results: EKG: Atrial fibrillation with RVR rate 148, RVR new compared to prior.  Assessment & Plan by Problem: Active Problems:   CAP (community acquired pneumonia)   Atrial fibrillation with RVR [facility])  75 year old [place] woman with PMH of 2 vessel CAD s/p CABG, Permanent Atrial Fibrillation on Eliquis, ICM (EF 50 % on TTE  04/06/15), Pulm HTN (PA peak pressuer 59 mmHg), PAD, CVA, COPD Gold Stage 3 (FEV1 45%, FEV1/FVC 56% in 2013), HTN, and HLD who presents with Afib with RVR and CAP.   AFib with RVR:  CHADSVASc score at least 7 with 9.6% yearly stroke risk. Patient with known history of Afib currently on Eliquis. Previously on Xarelto, but was switched after CVA while on this. She received Diltiazem bolus and infusion with  apparent cardioversion in the ED and diltiazem was subsequently discontinued. She continued to be in afib when seen with rates from 110-140. Likely triggered from her pleural effusion. She is on Metoprolol 25 mg BID and Digoxin 0.125 mg daily, which she has not taken this morning. Rates are improved with increase in oral Metoprolol to 50 mg.  -Increase Metoprolol to 50 mg daily -Hold Digoxin (unclear if should still be on this, Digoxin level <0.2) -Continue Eliquis 5 mg BID -Cardiac monitoring -Monitor vitals -Repeat EKG in AM   CAP: Patient with CXR findings of left sided opacity concerning for worsening pneumonia with pleural effusion compared to imaging from last week. She reports 2 days history of cough productive of dark sputum, she is unsure of purulence. She denies any fevers, SOB, or chills. She reports having pneumonia in the past at which time she felt much more sick than she does now. She does not have classicly convincing history of pneumonia, but does have a minimal leukocytosis and CXR findings concerning for parapneumonic effusion. She was started on IV Ceftriaxone and Azithromycin, which we will continue tomorrow and transition to oral antibiotics as indicated. -Continue IV Ceftriaxone 1 gram -Continue IV Azithromycin 500 mg -Likely transition to oral medications after tomorrow -Sputum culture -Monitor respiratory status -Supplemental O2 as needed   CAD s/p CABG and MI: Patient denies any chest pain, but reports during her prior MI her main complaint was similiar nausea as to this episode. EKG without obvious acute ischemic changes. Will trend troponins, although would expect some elevation secondary to demand ischemia in the setting of AFib with RVR -Trend troponins -Continue ASA 81 mg -Continue Crestor 5 mg  Hypothyroidism: Concern this was secondary to prior Amiodarone use. TSH 1.314, free T4 1.63. On Synthroid 75 mcg daily at home. -Continue Synthroid 75 mcg  Chronic  Back Pain: -Continue home Oxycodone-acetaminophen 5-325 mg q6h prn -Senokot-S  Diet: Regular  DVT ppx: Eliquis  Code: DNR (confirmed with patient and family), chaplain consult placed    Dispo: Disposition is deferred at this time, awaiting improvement of current medical problems. Anticipated discharge in approximately 1-2 day(s).   The patient does have a current PCP [name](Yun Sun, MD) and [facility] after discharge.  The patient does not have transportation limitations that hinder transportation to clinic appointments.  Signed: Darreld McleanVishal Patel, MD 09/18/2015, 3:31 PM

## 2015-09-18 NOTE — ED Notes (Signed)
Patient complains of heart racing that started yesterday and has persisted into this am with nausea and vomiting. Has taken an extra lopressor yesterday with no relief. Denies CP

## 2015-09-18 NOTE — Progress Notes (Signed)
Pt arrived to unit from ER.  Telemetry applied and CCMD notified.  Pt oriented to room including call light and telephone.  Pt denies sob at this time.  Will cont to monitor.

## 2015-09-18 NOTE — ED Notes (Signed)
After 20 mg bolus of cardizem, pt HR from 120 down to 55 bpm. MD Jeraldine LootsLockwood notified and advised to turn off infusion and continue to monitor patient for further need.

## 2015-09-19 ENCOUNTER — Other Ambulatory Visit: Payer: Self-pay

## 2015-09-19 ENCOUNTER — Inpatient Hospital Stay (HOSPITAL_COMMUNITY): Payer: Medicare HMO

## 2015-09-19 DIAGNOSIS — J90 Pleural effusion, not elsewhere classified: Secondary | ICD-10-CM

## 2015-09-19 DIAGNOSIS — Z7901 Long term (current) use of anticoagulants: Secondary | ICD-10-CM

## 2015-09-19 DIAGNOSIS — I2510 Atherosclerotic heart disease of native coronary artery without angina pectoris: Secondary | ICD-10-CM

## 2015-09-19 DIAGNOSIS — I4891 Unspecified atrial fibrillation: Secondary | ICD-10-CM

## 2015-09-19 DIAGNOSIS — Z79891 Long term (current) use of opiate analgesic: Secondary | ICD-10-CM

## 2015-09-19 DIAGNOSIS — I252 Old myocardial infarction: Secondary | ICD-10-CM

## 2015-09-19 DIAGNOSIS — Z951 Presence of aortocoronary bypass graft: Secondary | ICD-10-CM

## 2015-09-19 DIAGNOSIS — E039 Hypothyroidism, unspecified: Secondary | ICD-10-CM

## 2015-09-19 DIAGNOSIS — E079 Disorder of thyroid, unspecified: Secondary | ICD-10-CM

## 2015-09-19 DIAGNOSIS — G8929 Other chronic pain: Secondary | ICD-10-CM

## 2015-09-19 DIAGNOSIS — M549 Dorsalgia, unspecified: Secondary | ICD-10-CM

## 2015-09-19 LAB — BASIC METABOLIC PANEL
ANION GAP: 7 (ref 5–15)
BUN: 12 mg/dL (ref 6–20)
CO2: 27 mmol/L (ref 22–32)
Calcium: 8.3 mg/dL — ABNORMAL LOW (ref 8.9–10.3)
Chloride: 101 mmol/L (ref 101–111)
Creatinine, Ser: 0.82 mg/dL (ref 0.44–1.00)
GFR calc Af Amer: >60 mL/min (ref >60)
GFR calc non Af Amer: >60 mL/min (ref >60)
GLUCOSE: 87 mg/dL (ref 65–99)
Potassium: 4.5 mmol/L (ref 3.5–5.1)
Sodium: 135 mmol/L (ref 135–145)

## 2015-09-19 LAB — CBC
HCT: 35.4 % — ABNORMAL LOW (ref 36.0–46.0)
Hemoglobin: 10.2 g/dL — ABNORMAL LOW (ref 12.0–15.0)
MCH: 21.7 pg — AB (ref 26.0–34.0)
MCHC: 28.8 g/dL — ABNORMAL LOW (ref 30.0–36.0)
MCV: 75.3 fL — AB (ref 78.0–100.0)
PLATELETS: 205 10*3/uL (ref 150–400)
RBC: 4.7 MIL/uL (ref 3.87–5.11)
RDW: 18.1 % — AB (ref 11.5–15.5)
WBC: 7.1 10*3/uL (ref 4.0–10.5)

## 2015-09-19 LAB — TROPONIN I: Troponin I: <0.03 ng/mL (ref <0.031)

## 2015-09-19 MED ORDER — METOPROLOL TARTRATE 50 MG PO TABS
75.0000 mg | ORAL_TABLET | Freq: Two times a day (BID) | ORAL | Status: DC
  Administered 2015-09-19: 75 mg via ORAL
  Filled 2015-09-19: qty 1

## 2015-09-19 MED ORDER — ONDANSETRON HCL 4 MG/2ML IJ SOLN
4.0000 mg | Freq: Once | INTRAMUSCULAR | Status: AC
  Administered 2015-09-19: 4 mg via INTRAVENOUS

## 2015-09-19 MED ORDER — DEXTROSE 5 % IV SOLN
500.0000 mg | INTRAVENOUS | Status: DC
  Administered 2015-09-20: 500 mg via INTRAVENOUS
  Filled 2015-09-19: qty 500

## 2015-09-19 MED ORDER — ONDANSETRON HCL 4 MG/2ML IJ SOLN
4.0000 mg | Freq: Four times a day (QID) | INTRAMUSCULAR | Status: DC | PRN
  Administered 2015-09-19: 4 mg via INTRAVENOUS
  Filled 2015-09-19 (×2): qty 2

## 2015-09-19 MED ORDER — AMIODARONE HCL 200 MG PO TABS
200.0000 mg | ORAL_TABLET | Freq: Three times a day (TID) | ORAL | Status: DC
  Administered 2015-09-19: 200 mg via ORAL
  Filled 2015-09-19: qty 1

## 2015-09-19 MED ORDER — CEFTRIAXONE SODIUM 1 G IJ SOLR
1.0000 g | INTRAMUSCULAR | Status: DC
  Administered 2015-09-20: 1 g via INTRAVENOUS
  Filled 2015-09-19: qty 10

## 2015-09-19 MED ORDER — METOPROLOL TARTRATE 100 MG PO TABS
100.0000 mg | ORAL_TABLET | Freq: Two times a day (BID) | ORAL | Status: DC
  Administered 2015-09-19: 100 mg via ORAL
  Filled 2015-09-19: qty 1

## 2015-09-19 MED ORDER — LEVOTHYROXINE SODIUM 50 MCG PO TABS
50.0000 ug | ORAL_TABLET | Freq: Every day | ORAL | Status: DC
  Administered 2015-09-20 – 2015-09-23 (×4): 50 ug via ORAL
  Filled 2015-09-19 (×4): qty 1

## 2015-09-19 MED ORDER — PANTOPRAZOLE SODIUM 40 MG PO TBEC
40.0000 mg | DELAYED_RELEASE_TABLET | Freq: Every day | ORAL | Status: DC
  Administered 2015-09-19 – 2015-09-23 (×5): 40 mg via ORAL
  Filled 2015-09-19 (×5): qty 1

## 2015-09-19 NOTE — Progress Notes (Signed)
  Date: 09/19/2015  Patient name: [name]  Medical record number: [id number]  Date of birth: 07/16/1940   I have seen and evaluated [name] and discussed their care with the Residency Team. In brief, patient is a 75 y/o female with PMH of CAD s/p CABG, afib on a/c, pulm HTN, PAD, CVA, COPD, HTN who p/w palpitations * 1 day. She developed sudden onset nausea and palpitations while at home 1 day PTA which was similar to her previous episodes of afib with rvr. She spoke to her cardiologist and she was asked to increase her home metoprolol dose by 1/2 tab for 3 days. At approx 4 AM yesterday she had recurrent symptoms and came to the ED for follow up. Found to be in afib with RVR. She also complained of cough productive of dark phlegm for 2 days. No fevers, no SOB, no syncope, no vomiting, no abd pain, no diarrhea.  PMHx, Fam Hx, and/or Soc Hx : as per resident admit note  Filed Vitals:   09/18/15 2122 09/19/15 0511  BP: 125/90 114/68  Pulse: 103 72  Temp: 98 F (36.7 C) 97.8 F (36.6 C)  Resp: 20 18   Gen: AAO*3, NAD CVS: tachycardic, irregularly irregular Lungs: bibasilar crackles + Abd: soft, non tender, BS + Ext: no edema   Assessment and Plan: I have seen and evaluated the patient as outlined above. I agree with the formulated Assessment and Plan as detailed in the residents' note, with the following changes:   1. Afib with RVR: - Uncertain what precipitated current episode of afib with rvr. Possibly underlying PNA but CT chest with no definitive PNA.  - Will monitor on tele and increase dose of metoprolol to 75 mg bid - Cardio f/u and recommendations appreciated. Will add low dose amiodarone for afib - c/w eliquis for a/c  2. Possible CAP: - c/w ceftriaxone and zithromcin for now.  - CXR with moderate b/l pleural effusions - L>R. No evidence of malignancy - May need thoracentesis to further evaluate esp if continues to enlarge  3. Hypothyroidism: - Would consider  decreasing synthroid dose given increased free T3 esp in the setting of afib in an elderly patient   Earl LagosNischal Mindy Behnken, MD 6/18/201711:42 AM

## 2015-09-19 NOTE — Progress Notes (Signed)
Subjective: Patient noted to be in Afib this morning with RVR. Patient's main complaint is nausea with intermittent palpitations. She was unable to eat her breakfast. She would like to go home, but understands that she will stay in the hospital at least today until her heart rate is under better control and she feels improved.  Objective: Vital signs in last 24 hours: Filed Vitals:   09/18/15 1534 09/18/15 2122 09/19/15 0511 09/19/15 1144  BP: 118/79 125/90 114/68   Pulse: 88 103 72   Temp:  98 F (36.7 C) 97.8 F (36.6 C)   TempSrc:  Oral Oral   Resp: 18 20 18    Height:      Weight:      SpO2: 92% 100% 100% 98%   Weight change:   Intake/Output Summary (Last 24 hours) at 09/19/15 1258 Last data filed at 09/19/15 0800  Gross per 24 hour  Intake    580 ml  Output      0 ml  Net    580 ml   General: resting in bed Cardiac: tachycardic, irregularly, irregular Pulm: bibasilar crackles, dullness to percussion at left lung base Abd: soft, nontender, nondistended Ext: warm and well perfused, no pedal edema   Assessment/Plan:  Active Problems:   CAP (community acquired pneumonia)   Atrial fibrillation with RVR (HCC)   AFib with RVR: CHADSVASc score at least 7 with 9.6% yearly stroke risk. Patient with known history of Afib currently on Eliquis. Rates elevated this morning after fair control on Metoprolol 50 mg yesterday. Will increase metoprolol dosing and add amiodarone per cardiology recommendations. -Cardiology consulted, greatly appreciate assistance and recommendations -Increase Metoprolol to 75 mg daily -Hold Digoxin (Digoxin level <0.2) -Continue Eliquis 5 mg BID -Cardiac monitoring -Monitor vitals -Add Amiodarone 200 mg TID   Left pleural effusion: Patient with CXR findings of left sided opacity concerning for worsening pneumonia with pleural effusion compared to imaging from last week. She reports 2 days history of cough productive of dark sputum, she is unsure  of purulence. She denies any fevers, SOB, or chills. She was started on IV Ceftriaxone and Azithromycin, which we will continue for now. CT chest shows moderate bilateral pleural effusions, left greater than right without evidence for pneumonia or pulmonary edema. Will hold off on thoracentesis for now. If suspicion for enlarging effusion will need repeat imaging and thoracentesis. -Continue IV Ceftriaxone 1 gram -Continue IV Azithromycin 500 mg -Monitor respiratory status -Supplemental O2 as needed -Repeat CXR in 4 weeks, earlier if patient symptomatic  CAD s/p CABG and MI: Patient denies any chest pain, but reports during her prior MI her main complaint was similiar nausea as to this episode. EKG without obvious acute ischemic changes. Troponins are negative x 3. -Trend troponins -Continue ASA 81 mg -Continue Crestor 5 mg -Continue Metoprolol as above  Hypothyroidism:TSH 1.314, free T4 elevated at 1.63. On Synthroid 75 mcg daily at home. Will decrease dosing in setting of Afib RVR, increased T4, elderly age and low weight. -Decrease Synthroid to 50 mcg  Chronic Back Pain: -Continue home Oxycodone-acetaminophen 5-325 mg q6h prn -Senokot-S  Dispo: Disposition is deferred at this time, awaiting improvement of current medical problems.  Anticipated discharge in approximately 1-3 day(s).   The patient does have a current PCP [name](Yun Sun, MD) and [facility] after discharge.  The patient does have transportation limitations that hinder transportation to clinic appointments.    LOS: 1 day   Darreld McleanVishal Sheena Simonis,  MD 09/19/2015, 12:58 PM

## 2015-09-19 NOTE — Progress Notes (Signed)
Pt's daughter Fredricka BonineDedee called and ask to stop the Amiodarone due to previous side effects. MD notified

## 2015-09-19 NOTE — Progress Notes (Signed)
Pt heart rate is elevated in the 180s, MD notified new order received.

## 2015-09-19 NOTE — Progress Notes (Signed)
Pt refuses to take the amiodarone, due to previous sides effects she experienced in the past. She staed" I lost my eyesight by taking amiodarone, the left side is the worst, I can see with the right one."

## 2015-09-19 NOTE — Consult Note (Signed)
CARDIOLOGY CONSULT NOTE       Patient ID: [name] MRN: [id number] DOB/AGE: June 08, 1940 75 y.o.  Admit date: 09/18/2015 Referring Physician: Heide Spark Primary Physician: [name], MD Primary Cardiologist: Herbie Baltimore  Reason for Consultation: Afib   Active Problems:   CAP (community acquired pneumonia)   Atrial fibrillation with RVR (HCC)   HPI:  75 y.o. admitted with 2-3 day history of nausea, abdominal pain , back pain and dyspnea. History of ischemic DCM with CABG in 2009.  EF 40-45%  Chronic afib. Last attempt at [facility] 2014 unsuccessful  Did have MAZE during CABG procedure.  She tends to have increased rates when she is under Stress or sick. No sick contacts at home no diarrhea or vomiting She has chronic low back pain and nausea seems to have ppt more of this. Also history  PVD with mild claudication Left ABI .78.  No chest pain Mild dyspnea CXR with worsening left basilar infiltrate and small left pleural effusion WBC elevated No BNP ordered.  Compliant with meds Last seen by Dr Herbie Baltimore 07/2015 noted lower dose of lopressor and thoughts of using amiodarone for rate control If needed.    This patients CHA2DS2-VASc Score and unadjusted Ischemic Stroke Rate (% per year) is equal to 10.8 % stroke rate/year from a score of 8  Above score calculated as 1 point each if present [CHF, HTN, DM, Vascular=MI/PAD/Aortic Plaque, Age if 65-74, or Female] Above score calculated as 2 points each if present [Age > 75, or Stroke/TIA/TE]   ROS All other systems reviewed and negative except as noted above  Past Medical History  Diagnosis Date  . S/P CABG x 2 2009    LIMA-LAD, SVG-RCA, with AV fistula ligation and Maze procedure  . CAD (coronary artery disease), native coronary artery 2009    75% LAD, diffuse 75-90% RCA --> Referred for CABG + MAZE;  Cardiolite 12/2013: No ischemia or Infarction  . Ischemic cardiomyopathy 2012    EF ~40-45% by Echo  . Persistent atrial fibrillation (HCC) 04/05/2012     Now permanent A. fib s/p MAZE -- recurrence, cardioversion-converted to sinus bradycardia --> Now persistent  . Essential hypertension   . Dyslipidemia, goal LDL below 70     On Crestor, followed by PCP  . COPD (chronic obstructive pulmonary disease) (HCC)   . PAD (peripheral artery disease) (HCC) 2007    s/p L Ileac A stent ; most recent Dopplers July 2012: Less than 50% reduction bilaterally. ABI 0.96 on the right 0.88 on left;;;12/27/2011   -ABI right .87 and left ABI .78  ,LEFT CIA and EIA stent normall patency, left CFA,SFA,and popliteal 0-49%; rgt proximal SFA 50-69%,rgt CIA,EIA, and CFA 0-49%  . Chronic low back pain     scoliosis & lordosis  . H/O: pneumonia   . Hypothyroidism   . Anxiety   . Cervical neck pain with evidence of disc disease     with need for surgery -- November 2015  . Osteoarthritis of back     And neck, hands,spine  . H/O ischemic left MCA stroke 11/18/2014    MRI/MRA of head: Multifocal left MCA infarct.. Also possible left ACA territory infarct -- complete occlusion of left MCA distally at M2 level. Marked left ACA attenuation.  Marland Kitchen TIA (transient ischemic attack) 04/05/2015  . Pulmonary hypertension (HCC)     Estimated PA pressure on Echo January 2017 = 59 mmHg with dilated IVC, severely dilated RA and moderate TR.  . Stroke (HCC)  Family History  Problem Relation Age of Onset  . Heart attack Mother     Late 36s  . Stroke Mother   . Stroke Father   . Heart attack Father     14s  . Heart disease Brother 60    Cardiomegaly  . Stroke Sister   . Heart attack Son 33    Social History   Social History  . Marital Status: Widowed    Spouse Name: N/A  . Number of Children: N/A  . Years of Education: N/A   Occupational History  . Not on file.   Social History Main Topics  . Smoking status: Former Smoker -- 0.50 packs/day    Types: Cigarettes    Quit date: 01/07/1998  . Smokeless tobacco: Never Used  . Alcohol Use: No  . Drug Use: No  .  Sexual Activity: Not on file   Other Topics Concern  . Not on file   Social History Narrative    She is a divorced mother of 1 and one child who died. Her daughter is here with her   today. She has got 4 grandchildren. She is a native of [place] and only has an occasional alcoholic   beverage. She does not get routine exercise mostly because of her spine pain. She did previously smoke but quit many years ago.     Past Surgical History  Procedure Laterality Date  . Iliac artery stent Left 04/06/2005    Ex Iliac - CFA (Smart STENTS -- 7x4 in EIA, 6 x 3 CFA)   . Coronary artery bypass graft  2009    LIMA-LAD, SVG-RCA (also MAZE & AV fistula ligation)  . Maze  2009    along with CABG  . Cardioversion  04/05/2012    Procedure: CARDIOVERSION;  Surgeon: Thurmon Fair, MD;  Location: [facility] ENDOSCOPY;  Service: Cardiovascular;  Laterality: N/A;  . Transthoracic echocardiogram  Jan 2012    EF ~40-45%, global HK; PAP ~45-50 mmHg  . Nm myoview ltd  May 2013    No ischemia or Infarct  . Abdominal hysterectomy    . Cardiac catheterization  07/30/2007    75% LAD ,3 stenoses of 75-90% in  RCA;circumflex from proximal RCA with no lesion seen,normal ramus intermediate branh; normal LV systoilc function  . Nm cardiolite ltd  12/2013    Non-gated for Afib; No Ischemia or Infarction.  Marland Kitchen Anterior cervical decomp/discectomy fusion N/A 01/23/2014    Procedure: ANTERIOR CERVICAL DECOMPRESSION/DISCECTOMY FUSION CERVICAL FOUR-FIVE,CERVICAL FIVE-SIX,CERVICAL SIX-SEVEN;  Surgeon: Mariam Dollar, MD;  Location: [facility] NEURO ORS;  Service: Neurosurgery;  Laterality: N/A;  . Colonoscopy with propofol N/A 11/12/2014    Procedure: COLONOSCOPY WITH PROPOFOL;  Surgeon: Charna Elizabeth, MD;  Location: [facility] ENDOSCOPY;  Service: Endoscopy;  Laterality: N/A;  . Transthoracic echocardiogram  Jan 2017    EF ~50%. No RWMA, Afib. Paradoxical Septal motion. no DD assessment. Mod LA dilation.  PAP ~59 mmHg & dilated IVC. Mod TR. Severe RA  dilation.     Marland Kitchen apixaban  5 mg Oral BID  . aspirin EC  81 mg Oral Daily  . levothyroxine  75 mcg Oral QAC breakfast  . lubiprostone  24 mcg Oral QPM  . metoprolol  75 mg Oral BID  . pantoprazole  40 mg Oral Daily  . rosuvastatin  5 mg Oral QPM  . sodium chloride flush  3 mL Intravenous Q12H      Physical Exam: Blood pressure 114/68, pulse 72, temperature 97.8 F (36.6 C),  temperature source Oral, resp. rate 18, height 5' (1.524 m), weight 90 lb (40.824 kg), SpO2 100 %.   Affect appropriate Elderly white female  HEENT: normal Neck supple with no adenopathy JVP normal no bruits no thyromegaly Lungs clear with no wheezing and good diaphragmatic motion Heart:  S1/S2 SEM  murmur, no rub, gallop or click PMI  enalarged and forceful  Abdomen: benighn, BS positve, no tenderness, no AAA no bruit.  No HSM or HJR Distal pulses intact with no bruits No edema Neuro non-focal Skin warm and dry No muscular weakness   Labs:   Lab Results  Component Value Date   WBC 7.1 09/19/2015   HGB 10.2* 09/19/2015   HCT 35.4* 09/19/2015   MCV 75.3* 09/19/2015   PLT 205 09/19/2015    Recent Labs Lab 09/19/15 0215  NA 135  K 4.5  CL 101  CO2 27  BUN 12  CREATININE 0.82  CALCIUM 8.3*  GLUCOSE 87   Lab Results  Component Value Date   CKTOTAL 40 04/16/2010   CKMB 1.5 04/16/2010   TROPONINI <0.03 09/19/2015    Lab Results  Component Value Date   CHOL 123 04/06/2015   CHOL 138 11/19/2014   CHOL 170 11/18/2014   Lab Results  Component Value Date   HDL 63 04/06/2015   HDL 65 11/19/2014   HDL 78 11/18/2014   Lab Results  Component Value Date   LDLCALC 47 04/06/2015   LDLCALC 63 11/19/2014   LDLCALC 81 11/18/2014   Lab Results  Component Value Date   TRIG 64 04/06/2015   TRIG 49 11/19/2014   TRIG 57 11/18/2014   Lab Results  Component Value Date   CHOLHDL 2.0 04/06/2015   CHOLHDL 2.1 11/19/2014   CHOLHDL 2.2 11/18/2014   No results found for: LDLDIRECT      Radiology: Dg Chest 2 View  09/18/2015  CLINICAL DATA:  Cardiac palpitations. EXAM: CHEST  2 VIEW COMPARISON:  Radiograph of September 11, 2015. FINDINGS: Stable cardiomegaly. Status post coronary artery bypass graft. No pneumothorax is noted. Increased left basilar opacity is noted concerning for pneumonia or atelectasis with associated pleural effusion. Stable mild right pleural effusion is noted with associated subsegmental atelectasis. Bony thorax is unremarkable. IMPRESSION: Increased left basilar opacity is noted concerning for worsening pneumonia or atelectasis with associated pleural effusion. Mild right pleural effusion is noted with associated subsegmental atelectasis. Electronically Signed   By: Lupita Raider, M.D.   On: 09/18/2015 09:47   Dg Chest 2 View  09/11/2015  CLINICAL DATA:  Left arm pain for 2 weeks, chronic back pain EXAM: CHEST  2 VIEW COMPARISON:  04/06/2015 FINDINGS: Cardiomegaly again noted. Status post CABG. There is small left pleural effusion with left basilar atelectasis or infiltrate. Osteopenia and mild degenerative change thoracic spine. No pulmonary edema IMPRESSION: Cardiomegaly. Status post CABG. No pulmonary edema. Small left pleural effusion with left basilar atelectasis or infiltrate. Electronically Signed   By: Natasha Mead M.D.   On: 09/11/2015 11:43   Ct Chest Wo Contrast  09/19/2015  CLINICAL DATA:  Bilateral pleural effusions.  Follow-up exam. EXAM: CT CHEST WITHOUT CONTRAST TECHNIQUE: Multidetector CT imaging of the chest was performed following the standard protocol without IV contrast. COMPARISON:  Chest radiograph, 09/18/2015.  Chest CT, 11/18/2014. FINDINGS: Neck base and axilla: Sub cm thyroid nodules, with a calcified left nodule, similar to the prior CT. No mass or adenopathy. Mediastinum and hila: There changes from previous cardiac surgery. Dense coronary artery calcifications  are noted. The heart is mildly enlarged. Atherosclerotic calcifications are noted  along the thoracic aorta. There are several prominent to mildly enlarged lymph nodes. There are adjacent right peritracheal azygos level lymph nodes in the precarinal node that measure 1 cm short axis. There is a subcarinal node measuring 14 mm in short axis. No mediastinal or hilar masses. No discrete enlarged hilar lymph nodes. Lungs and pleura: Moderate, left greater than right, pleural effusions. Advanced changes of centrilobular emphysema. There is dependent lung opacity most evident in the lower lobes, left greater than right, consistent with atelectasis. Bronchial wall thickening in the lower lobes right middle lobe is also noted. There is no convincing pneumonia or pulmonary edema. No pneumothorax. No discrete mass or suspicious nodule. Limited upper abdomen: No acute finding. Left renal upper pole cyst. Musculoskeletal: Bony demineralization. Degenerative changes of the visualized spine. Minor wedge-shaped compression deformity of a mid thoracic vertebra, stable. No osteoblastic or osteolytic lesions. IMPRESSION: 1. Moderate, left greater than right, pleural effusions with associated dependent atelectasis, most evident in the left lower lobe. 2. No convincing pneumonia or pulmonary edema. 3. Advanced emphysema. 4. Mild mediastinal adenopathy, most likely reactive. 5. Mild cardiomegaly, stable changes from previous cardiac surgery and dense coronary artery calcifications. Electronically Signed   By: Amie Portlandavid  Ormond M.D.   On: 09/19/2015 10:55    EKG:  afib nonspecific ST changes    ASSESSMENT AND PLAN:  Afib:  Continue eliquis for anticoagulation given previous stroke and high CHA2VASC score.  Increase lopressor and add low dose amiodarone  CAD/CABG:  No chest pain troponin negative no acute sT changes on low dose asa and beta blocker stable Back Pain: chronic pain management per primary service Nausea:  Abdomen benign ? Viral illness advance diet Pneumonia:  Discussed with service left pleural  effusion is small f/u CT to see what else is going on in L[facility]  Thyroid:  On synthroid T4 high but TSH normal will need to follow on amiodarone   Signed: Charlton Hawseter Lavarr President 09/19/2015, 10:59 AM

## 2015-09-20 MED ORDER — AMOXICILLIN 500 MG PO CAPS
500.0000 mg | ORAL_CAPSULE | Freq: Three times a day (TID) | ORAL | Status: DC
  Administered 2015-09-21: 500 mg via ORAL
  Filled 2015-09-20: qty 1

## 2015-09-20 MED ORDER — AZITHROMYCIN 500 MG PO TABS
500.0000 mg | ORAL_TABLET | Freq: Every day | ORAL | Status: DC
  Filled 2015-09-20: qty 1

## 2015-09-20 MED ORDER — DIGOXIN 125 MCG PO TABS
125.0000 ug | ORAL_TABLET | Freq: Every day | ORAL | Status: DC
  Administered 2015-09-20 – 2015-09-21 (×2): 125 ug via ORAL
  Filled 2015-09-20 (×2): qty 1

## 2015-09-20 MED ORDER — FUROSEMIDE 20 MG PO TABS
20.0000 mg | ORAL_TABLET | Freq: Every day | ORAL | Status: DC
  Administered 2015-09-20 – 2015-09-22 (×3): 20 mg via ORAL
  Filled 2015-09-20 (×3): qty 1

## 2015-09-20 MED ORDER — METOPROLOL TARTRATE 50 MG PO TABS
50.0000 mg | ORAL_TABLET | Freq: Two times a day (BID) | ORAL | Status: DC
  Administered 2015-09-20 – 2015-09-21 (×2): 50 mg via ORAL
  Filled 2015-09-20 (×2): qty 1

## 2015-09-20 MED ORDER — METOPROLOL TARTRATE 50 MG PO TABS
75.0000 mg | ORAL_TABLET | Freq: Two times a day (BID) | ORAL | Status: DC
  Filled 2015-09-20: qty 1

## 2015-09-20 NOTE — Progress Notes (Signed)
    Subjective:  Denies CP or dyspnea   Objective:  Filed Vitals:   09/19/15 1332 09/19/15 1921 09/19/15 2317 09/20/15 0518  BP: 101/54 113/80  99/73  Pulse: 59 141 92 130  Temp: 97.5 F (36.4 C) 98.2 F (36.8 C)  98.1 F (36.7 C)  TempSrc: Oral Oral  Oral  Resp: 18 18  18   Height:      Weight:      SpO2: 96% 92%  91%    Intake/Output from previous day:  Intake/Output Summary (Last 24 hours) at 09/20/15 0811 Last data filed at 09/19/15 1800  Gross per 24 hour  Intake    360 ml  Output      0 ml  Net    360 ml    Physical Exam: Physical exam: Well-developed frail in no acute distress.  Skin is warm and dry.  HEENT is normal.  Neck is supple.  Chest with diminished BS throughout Cardiovascular exam is irregular Abdominal exam nontender or distended. No masses palpated. Extremities show no edema. neuro grossly intact    Lab Results: Basic Metabolic Panel:  Recent Labs  16/01/9605/17/17 0856 09/19/15 0215  NA 134* 135  K 4.6 4.5  CL 99* 101  CO2 26 27  GLUCOSE 138* 87  BUN 13 12  CREATININE 0.90 0.82  CALCIUM 8.6* 8.3*  MG 1.9  --    CBC:  Recent Labs  09/18/15 0856 09/19/15 0215  WBC 10.9* 7.1  HGB 11.5* 10.2*  HCT 41.1 35.4*  MCV 77.0* 75.3*  PLT 325 205   Cardiac Enzymes:  Recent Labs  09/18/15 1510 09/18/15 2022 09/19/15 0215  TROPONINI <0.03 <0.03 <0.03     Assessment/Plan:  1 Atrial fibrillation-rate mildly elevated at times; change metoprolol to 75 mg BID and follow; had eye problems with amiodarone previously and refuses; may need dig as BP borderline; continue apixaban. CHADSvasc 8. 2 Possible pneumonia-per primary service. 3 CAD-continue statin; given need for anticoagulation, will DC ASA. 4 Pleural effusions-Add low dose lasix and follow renal function.  Olga MillersBrian Gonsalo Cuthbertson 09/20/2015, 8:11 AM

## 2015-09-20 NOTE — Discharge Instructions (Addendum)
Please adjust your medications as follows: 1. Continue Metoprolol 25 mg twice a day 2. Stop Digoxin 3. Start Diltiazem 120 mg once a day 4. Start Lasix (furosemide) once a day. 5. Continue Eliquis twice a day  Please follow up with Cardiology and your PCP as scheduled. You will need a repeat chest xray to monitor for improvement of the fluid on your lungs.      Information on my medicine - ELIQUIS (apixaban)  Why was Eliquis prescribed for you? Eliquis was prescribed for you to reduce the risk of a blood clot forming that can cause a stroke if you have a medical condition called atrial fibrillation (a type of irregular heartbeat).  What do You need to know about Eliquis ? Take your Eliquis TWICE DAILY - one tablet in the morning and one tablet in the evening with or without food. If you have difficulty swallowing the tablet whole please discuss with your pharmacist how to take the medication safely.  Take Eliquis exactly as prescribed by your doctor and DO NOT stop taking Eliquis without talking to the doctor who prescribed the medication.  Stopping may increase your risk of developing a stroke.  Refill your prescription before you run out.  After discharge, you should have regular check-up appointments with your healthcare provider that is prescribing your Eliquis.  In the future your dose may need to be changed if your kidney function or weight changes by a significant amount or as you get older.  What do you do if you miss a dose? If you miss a dose, take it as soon as you remember on the same day and resume taking twice daily.  Do not take more than one dose of ELIQUIS at the same time to make up a missed dose.  Important Safety Information A possible side effect of Eliquis is bleeding. You should call your healthcare provider right away if you experience any of the following: ? Bleeding from an injury or your nose that does not stop. ? Unusual colored urine (red or dark  brown) or unusual colored stools (red or black). ? Unusual bruising for unknown reasons. ? A serious fall or if you hit your head (even if there is no bleeding).  Some medicines may interact with Eliquis and might increase your risk of bleeding or clotting while on Eliquis. To help avoid this, consult your healthcare provider or pharmacist prior to using any new prescription or non-prescription medications, including herbals, vitamins, non-steroidal anti-inflammatory drugs (NSAIDs) and supplements.  This website has more information on Eliquis (apixaban): http://www.eliquis.com/eliquis/home

## 2015-09-20 NOTE — Progress Notes (Signed)
PT Cancellation Note  Patient Details Name: Jeneen RinksRobina B Arenz MRN: [id number] DOB: 03/23/1941   Cancelled Treatment:    Reason Eval/Treat Not Completed: Other (comment). Pt fatigued after amb to the bathroom. Pt also with resting HR in 130's. Will try again tomorrow.   Chett Taniguchi 09/20/2015, 3:05 PM Fluor CorporationCary Cristan Hout PT [phone]

## 2015-09-20 NOTE — Progress Notes (Signed)
Patient seen and examined. Case d/w residents in detail. I agree with findings and plan as documented in Dr. Eliane DecreePatel's note.  Patient still with episodes of afib with rvr. Will c/w low dose metoprolol. Patient refuses amiodarone and BP remains borderline. Will consider starting low dose digoxin for rate control.  Will d/c IV abx and switch to PO for coverage for PNA to complete 5 day course.

## 2015-09-20 NOTE — Consult Note (Signed)
[place] met with patient and reviewed HCPOA/Advamced Directive material for pt to complete with help from daughter; please page [facility] [phone] when completed.  [place] has arranged Notary and witnesses between 1:30 - 2:00 today, 6/19. 11:00 AM Gwynn Burly

## 2015-09-20 NOTE — Progress Notes (Signed)
Subjective: Patient refused Amiodarone yesterday. She does not want to restart this as she reports experiencing visual disturbances on Amio in the past. She says she is not having any chest pain, palpitations, nausea, or dyspnea at rest, but gets tired when walking from the bed to the bathroom and back. Objective: Vital signs in last 24 hours: Filed Vitals:   09/19/15 1921 09/19/15 2317 09/20/15 0518 09/20/15 0947  BP: 113/80  99/73 83/52  Pulse: 141 92 130 88  Temp: 98.2 F (36.8 C)  98.1 F (36.7 C)   TempSrc: Oral  Oral   Resp: 18  18   Height:      Weight:      SpO2: 92%  91%    Weight change:   Intake/Output Summary (Last 24 hours) at 09/20/15 1042 Last data filed at 09/19/15 1800  Gross per 24 hour  Intake    360 ml  Output      0 ml  Net    360 ml   General: resting in bed, no acute distress Cardiac: tachycardic, irregularly, irregular Pulm: crackles b/l lung fields, diminished breath sounds left lower lung field Abd: soft, nontender, nondistended Ext: warm and well perfused, no pedal edema   Assessment/Plan:  Active Problems:   CAP (community acquired pneumonia)   Atrial fibrillation with RVR (HCC)   AFib with RVR: CHADSVASc score 8. Patient with known history of Afib currently on Eliquis. She refuses to restart Amiodarone. Her metoprolol was increased to 100 mg BID overnight. Her blood pressures have been borderline to soft. Her rates still elevated this morning up to 130s. Has been on digoxin at home, although this was apparently discontinued about 2 months ago. -Cardiology consulted, greatly appreciate assistance and recommendations -Metoprolol back to 75 mg daily -Hold Digoxin, consider resuming -Continue Eliquis 5 mg BID -Cardiac monitoring -Monitor vitals -Amiodarone 200 mg TID d/c'ed   Left pleural effusion: Patient with CXR findings of left sided opacity concerning for worsening pneumonia with pleural effusion compared to imaging from 1 week  prior. She reports 2 days history of cough productive of dark sputum, she is unsure of purulence. She denied any fevers, SOB, or chills. She was started on IV Ceftriaxone and Azithromycin, completed 3 days so far. CT chest showed moderate bilateral pleural effusions, left greater than right without evidence for pneumonia or pulmonary edema. Do not have strong suspicion of pneumonia now, however due to patients productive cough, mild leukocytosis, Afib RVR, and improvement with antibiotics, will complete 5 day course with switch to oral meds. Repeat imaging tomorrow morning to reevaluate effusion and consideration for thoracentesis. Agree with addition of lasix per cardiology. -d/c IV Ceftriaxone and IV Azithromycin -Oral amoxicillin and azithromycin starting tomorrow to complete 5 day course -Lasix 20 mg po daily -Monitor BMP -Repeat 2V CXR and lateral decubitus in the AM  CAD s/p CABG and MI: Denies chest pain. EKG without obvious acute ischemic changes. Troponins are negative x 3. -ASA 81 mg d/c'ed per cards -Continue Crestor 5 mg -Continue Metoprolol as above  Hypothyroidism:TSH 1.314, free T4 elevated at 1.63. On Synthroid 75 mcg daily at home. Decreased dosing in setting of Afib RVR, increased T4, elderly age and low weight. -Decreased Synthroid to 50 mcg po daily  Chronic Back Pain: -Continue home Oxycodone-acetaminophen 5-325 mg q6h prn -Senokot-S  Dispo: Disposition is deferred at this time, awaiting improvement of current medical problems.  Anticipated discharge in approximately 1-3 day(s).   The patient does have a current PCP (  [name], MD) and does not need an [facility] hospital follow-up appointment after discharge.  The patient does have transportation limitations that hinder transportation to clinic appointments.    LOS: 2 days   Darreld Mclean, MD 09/20/2015, 10:42 AM

## 2015-09-20 NOTE — Progress Notes (Signed)
   09/20/15 1400  Clinical Encounter Type  Visited With Patient;Family;Patient and family together  Visit Type Follow-up;Social support;Spiritual support;Other (Comment) (Complete HCPOA)  Referral From Patient;Nurse  Spiritual Encounters  Spiritual Needs Literature;Emotional;Grief support  CH returned to pt with notary and witnesses to complete AD & HCPOA paperwork. Literature completed and given to RN. 2:22 PM Erline LevineMichael I Bolivar Koranda

## 2015-09-21 ENCOUNTER — Encounter (HOSPITAL_COMMUNITY): Payer: Self-pay | Admitting: General Practice

## 2015-09-21 ENCOUNTER — Inpatient Hospital Stay (HOSPITAL_COMMUNITY): Payer: Medicare HMO

## 2015-09-21 LAB — BASIC METABOLIC PANEL
ANION GAP: 8 (ref 5–15)
BUN: 17 mg/dL (ref 6–20)
CO2: 31 mmol/L (ref 22–32)
Calcium: 8.3 mg/dL — ABNORMAL LOW (ref 8.9–10.3)
Chloride: 99 mmol/L — ABNORMAL LOW (ref 101–111)
Creatinine, Ser: 1.16 mg/dL — ABNORMAL HIGH (ref 0.44–1.00)
GFR, EST AFRICAN AMERICAN: 52 mL/min — AB (ref >60)
GFR, EST NON AFRICAN AMERICAN: 45 mL/min — AB (ref >60)
Glucose, Bld: 94 mg/dL (ref 65–99)
POTASSIUM: 4.9 mmol/L (ref 3.5–5.1)
SODIUM: 138 mmol/L (ref 135–145)

## 2015-09-21 LAB — CBC
HEMATOCRIT: 36 % (ref 36.0–46.0)
HEMOGLOBIN: 10 g/dL — AB (ref 12.0–15.0)
MCH: 21.4 pg — ABNORMAL LOW (ref 26.0–34.0)
MCHC: 27.8 g/dL — ABNORMAL LOW (ref 30.0–36.0)
MCV: 76.9 fL — ABNORMAL LOW (ref 78.0–100.0)
Platelets: 208 10*3/uL (ref 150–400)
RBC: 4.68 MIL/uL (ref 3.87–5.11)
RDW: 18.2 % — ABNORMAL HIGH (ref 11.5–15.5)
WBC: 6.7 10*3/uL (ref 4.0–10.5)

## 2015-09-21 MED ORDER — METOPROLOL TARTRATE 25 MG PO TABS
25.0000 mg | ORAL_TABLET | Freq: Two times a day (BID) | ORAL | Status: DC
  Administered 2015-09-21 – 2015-09-23 (×4): 25 mg via ORAL
  Filled 2015-09-21 (×4): qty 1

## 2015-09-21 MED ORDER — DILTIAZEM HCL 30 MG PO TABS
30.0000 mg | ORAL_TABLET | Freq: Four times a day (QID) | ORAL | Status: DC
  Administered 2015-09-21 – 2015-09-23 (×6): 30 mg via ORAL
  Filled 2015-09-21 (×6): qty 1

## 2015-09-21 MED ORDER — DILTIAZEM HCL 30 MG PO TABS
30.0000 mg | ORAL_TABLET | Freq: Four times a day (QID) | ORAL | Status: DC
  Administered 2015-09-21: 30 mg via ORAL
  Filled 2015-09-21: qty 1

## 2015-09-21 MED ORDER — OXYCODONE-ACETAMINOPHEN 5-325 MG PO TABS
0.5000 | ORAL_TABLET | Freq: Four times a day (QID) | ORAL | Status: DC | PRN
  Administered 2015-09-21 – 2015-09-23 (×4): 1 via ORAL
  Filled 2015-09-21 (×4): qty 1

## 2015-09-21 NOTE — Progress Notes (Signed)
Notified by CCMD that pt had 2 second pause at 1334. Vital signs taken at 1345 - hypotensive at 95/57, HR 60s-70s - pt asymptomatic. Pt received PRN percocet for chronic back pain at 1246. PA (Dunn) made aware of pause. Will continue to monitor.   Leonidas Rombergaitlin S Bumbledare, RN

## 2015-09-21 NOTE — Progress Notes (Signed)
Called by nurse for slow HR this afternoon - transiently in the 40s with just over 2 sec pause. Asymptomatic. BP remains consistently on the softer side. HR now 61. Digoxin started yesterday.  Diltiazem just added this AM for high HRs. D/w Dr. Jens Somrenshaw - he recommends d/c digoxin, continue oral diltiazem, and decrease metoprolol to 25mg  BID. Hold parameters written on the above meds. Will hold next dose of diltiazem and restart at 0000 after she's gotten the lower dose of BB. Wendie Diskin PA-C

## 2015-09-21 NOTE — Progress Notes (Signed)
Patient seen and examined. Case d/w residents in detail. I agree with findings and plan as documented in Dr. Eliane DecreePatel's note.  Patient still with episodes of afib with rvr. Cardio f/u appreciated- cardizem added to regimen. This afternoon was noted to have a 2 second pause with HR transiently in the 40s. Metoprolol decreased to 25 mg bid and digoxin dc'd. Will c/w apixiban for a/c for now.   Repeat X rays done today show improvement in pleural effusions - likely diastolic HF secondary to afib with rvr. Will c/w lasix for today. Creatinine mildly increased. Will monitor closely.  I doubt underlying PNA. Will d/c abx today and monitor.

## 2015-09-21 NOTE — Evaluation (Signed)
Physical Therapy Evaluation Patient Details Name: Brenda RinksRobina B Glass MRN: [id number] DOB: 10/16/1940 Today's Date: 09/21/2015   History of Present Illness  Pt adm with afib with RVR, mainly CAP.   PMH - CAD s/p CABG, afib on a/c, pulm HTN, PAD, CVA, COPD, HTN   Clinical Impression  Pt is able to walk and declines to get out of room, although with her elevated pulses does not need to push herself too much.  Has increased her confidence with movement and demonstrated that limited help is needed.  However is requiring monitoring of vitals to avoid overstressing her system esp as she is quickly increasing 40 b/min with activity.  Follow acutely to maintain her safety and increase strength/balance.    Follow Up Recommendations Home health PT;Supervision for mobility/OOB    Equipment Recommendations  Rolling walker with 5" wheels    Recommendations for Other Services Rehab consult     Precautions / Restrictions Precautions Precautions: Fall (telemetry) Restrictions Weight Bearing Restrictions: No      Mobility  Bed Mobility Overal bed mobility: Needs Assistance Bed Mobility: Supine to Sit;Sit to Supine     Supine to sit: Min assist;Mod assist;HOB elevated Sit to supine: Min assist;Mod assist   General bed mobility comments: help to lift trunk from her side and to lift legs back to bed  Transfers Overall transfer level: Needs assistance Equipment used: Rolling walker (2 wheeled);1 person hand held assist Transfers: Sit to/from [employer] CorporationStand;Stand Pivot Transfers Sit to Stand: Min assist Stand pivot transfers: Min assist;Min guard       General transfer comment: cues for hand placement and safety with return to bedside  Ambulation/Gait Ambulation/Gait assistance: Min assist Ambulation Distance (Feet): 80 Feet Assistive device: Rolling walker (2 wheeled);1 person hand held assist Gait Pattern/deviations: Step-through pattern;Step-to pattern;Trunk flexed;Shuffle;Decreased stride  length Gait velocity: reduced in the room Gait velocity interpretation: Below normal speed for age/gender General Gait Details: Pt was tired from pulses but not symptomatic with elevations in her room  Stairs            Wheelchair Mobility    Modified Rankin (Stroke Patients Only)       Balance Overall balance assessment: Needs assistance Sitting-balance support: Feet supported Sitting balance-Leahy Scale: Good Sitting balance - Comments: after getting set from sitting up   Standing balance support: Bilateral upper extremity supported Standing balance-Leahy Scale: Fair                               Pertinent Vitals/Pain Pain Assessment: No/denies pain    Home Living Family/patient expects to be discharged to:: Private residence Living Arrangements: Children Available Help at Discharge: Family;Available 24 hours/day Type of Home: House Home Access: Level entry     Home Layout: Two level;Able to live on main level with bedroom/bathroom Home Equipment: Tub bench Additional Comments: uses no AD at home    Prior Function Level of Independence: Needs assistance   Gait / Transfers Assistance Needed: walked with no AD but slowly  ADL's / Homemaking Assistance Needed: supervision/setup with bathing at shower level (upstairs)        Hand Dominance   Dominant Hand: Right    Extremity/Trunk Assessment   Upper Extremity Assessment: Generalized weakness           Lower Extremity Assessment: Generalized weakness      Cervical / Trunk Assessment: Kyphotic  Communication   Communication: No difficulties  Cognition Arousal/Alertness: Awake/alert Behavior During  Therapy: Flat affect Overall Cognitive Status: Within Functional Limits for tasks assessed                      General Comments General comments (skin integrity, edema, etc.): Pt has controlled pulse sitting, with 99 starting and after walks in room back and forth was 139.   Rested and began to decline but then trends up a bit again.    Exercises General Exercises - Lower Extremity Ankle Circles/Pumps: AROM;Both;10 reps Quad Sets: AROM;Both;10 reps      Assessment/Plan    PT Assessment Patient needs continued PT services  PT Diagnosis Difficulty walking;Generalized weakness   PT Problem List Decreased range of motion;Decreased strength;Decreased activity tolerance;Decreased balance;Decreased mobility;Decreased knowledge of use of DME;Decreased safety awareness;Cardiopulmonary status limiting activity  PT Treatment Interventions DME instruction;Gait training;Functional mobility training;Therapeutic activities;Therapeutic exercise;Balance training;Neuromuscular re-education;Patient/family education   PT Goals (Current goals can be found in the Care Plan section) Acute Rehab PT Goals Patient Stated Goal: get home and feel better PT Goal Formulation: With patient/family Time For Goal Achievement: 10/05/15 Potential to Achieve Goals: Good    Frequency Min 3X/week   Barriers to discharge Other (comment) (has help at home)      Co-evaluation               End of Session Equipment Utilized During Treatment: Gait belt;Oxygen Activity Tolerance: Patient tolerated treatment well;Patient limited by fatigue (monitoring pulses and pt not aware when up) Patient left: in bed;with call bell/phone within reach;with bed alarm set;with family/visitor present (pt request to get back to bed) Nurse Communication: Mobility status         Time: 4098-1191 PT Time Calculation (min) (ACUTE ONLY): 18 min   Charges:   PT Evaluation $PT Eval Moderate Complexity: 1 Procedure     PT G CodesIvar Drape 09-27-2015, 12:33 PM    Samul Dada, PT MS Acute Rehab Dept. Number: [facility] [id number] and [facility] [phone]

## 2015-09-21 NOTE — Progress Notes (Signed)
    Subjective:  Denies CP or dyspnea; complains of back ache   Objective:  Filed Vitals:   09/20/15 0947 09/20/15 1447 09/20/15 2101 09/21/15 0530  BP: 83/52 91/65 119/98 127/79  Pulse: 88 122 75 154  Temp:  97.8 F (36.6 C) 98.1 F (36.7 C) 97.5 F (36.4 C)  TempSrc:  Oral Oral Oral  Resp:  18 18 18   Height:      Weight:      SpO2:  92% 98% 92%    Intake/Output from previous day:  Intake/Output Summary (Last 24 hours) at 09/21/15 0750 Last data filed at 09/20/15 2200  Gross per 24 hour  Intake     10 ml  Output      0 ml  Net     10 ml    Physical Exam: Physical exam: Well-developed frail in no acute distress.  Skin is warm and dry.  HEENT is normal.  Neck is supple.  Chest with diminished BS throughout, particularly bases Cardiovascular exam is irregular Abdominal exam nontender or distended. No masses palpated. Extremities show no edema. neuro grossly intact    Lab Results: Basic Metabolic Panel:  Recent Labs  40/98/1106/17/17 0856 09/19/15 0215 09/21/15 0249  NA 134* 135 138  K 4.6 4.5 4.9  CL 99* 101 99*  CO2 26 27 31   GLUCOSE 138* 87 94  BUN 13 12 17   CREATININE 0.90 0.82 1.16*  CALCIUM 8.6* 8.3* 8.3*  MG 1.9  --   --    CBC:  Recent Labs  09/19/15 0215 09/21/15 0249  WBC 7.1 6.7  HGB 10.2* 10.0*  HCT 35.4* 36.0  MCV 75.3* 76.9*  PLT 205 208   Cardiac Enzymes:  Recent Labs  09/18/15 1510 09/18/15 2022 09/19/15 0215  TROPONINI <0.03 <0.03 <0.03     Assessment/Plan:  1 Atrial fibrillation-rate elevated; had eye problems with amiodarone previously and refuses; continue metoprolol 50 mg BID; dig added yesterday; will try low dose diltiazem as well; we are limited somewhat by BP; if rate not controlled by above measures, may need AV node ablation and pacemaker; continue apixaban. CHADSvasc 8. 2 Possible pneumonia-per primary service. 3 CAD-continue statin; given need for anticoagulation, no ASA given need for anticoagulation. 4  Pleural effusions-continue low dose lasix and follow renal function.  Brenda MillersBrian Glass 09/21/2015, 7:50 AM

## 2015-09-21 NOTE — Progress Notes (Signed)
Subjective: Patient reports feeling her heart racing and some shortness of breath when she ambulates. She says her back pain is acting up. Her nausea is controlled and she does not have any chest pain. She says she feels "lousy." Objective: Vital signs in last 24 hours: Filed Vitals:   09/20/15 0947 09/20/15 1447 09/20/15 2101 09/21/15 0530  BP: 83/52 91/65 119/98 127/79  Pulse: 88 122 75 154  Temp:  97.8 F (36.6 C) 98.1 F (36.7 C) 97.5 F (36.4 C)  TempSrc:  Oral Oral Oral  Resp:  18 18 18   Height:      Weight:      SpO2:  92% 98% 92%   Weight change:   Intake/Output Summary (Last 24 hours) at 09/21/15 0920 Last data filed at 09/21/15 [id number]  Gross per 24 hour  Intake    250 ml  Output      0 ml  Net    250 ml   General: resting in bed, no acute distress Cardiac: tachycardic, irregularly, irregular Pulm: crackles bibasilar R>L, diminished breath sounds left lower lung field Abd: soft, nontender, nondistended Ext: warm and well perfused, no pedal edema   Assessment/Plan:  Active Problems:   CAP (community acquired pneumonia)   Atrial fibrillation with RVR (HCC)   AFib with RVR: CHADSVASc score 8. Patient with known history of Afib currently on Eliquis. She refuses to restart Amiodarone due to visual disturbance in the past. Metoprolol was decreased to 50 mg yesterday as her blood pressures have been borderline to soft. Her rates still elevated this morning up to 150s. Home digoxin has been restarted and diltiazem added today. -Cardiology consulted, greatly appreciate assistance and recommendations -Metoprolol 50 mg BID -Digoxin 0.125 mg daily -Diltiazem 30 mg q6h -Continue Eliquis 5 mg BID -Cardiac monitoring -Monitor vitals   Left pleural effusion: Patient with CXR findings of left sided opacity concerning for worsening pneumonia with pleural effusion compared to imaging from 1 week prior. She reports 2 days history of cough productive of dark sputum, she is  unsure of purulence. She denied any fevers, SOB, or chills. She was started on IV Ceftriaxone and Azithromycin, completed 3 days. CT chest showed moderate bilateral pleural effusions, left greater than right without evidence for pneumonia or pulmonary edema. Repeat 2V CXR this morning shows slight improvement in effusions. Lateral decubitus shows a small layering left pleural effusion. Will hold off on thoracentesis. Do not have strong suspicion of pneumonia now and feel this is likely related to volume overload secondary to her Afib RVR. -d/c antibiotics -Continue Lasix 20 mg po daily -Monitor BMP for renal function -Strict I/Os, daily weights  CAD s/p CABG and MI: Denies chest pain. EKG without obvious acute ischemic changes. Troponins are negative x 3. -ASA 81 mg d/c'ed per cards -Continue Crestor 5 mg -Continue Metoprolol as above  Hypothyroidism:TSH 1.314, free T4 elevated at 1.63. On Synthroid 75 mcg daily at home. Decreased dosing in setting of Afib RVR, increased T4, elderly age and low weight. -Decreased Synthroid to 50 mcg po daily  Chronic Back Pain: -Continue home Oxycodone-acetaminophen 5-325 mg q6h prn -Senokot-S  Dispo: Disposition is deferred at this time, awaiting improvement of current medical problems.  Anticipated discharge in approximately 1-3 day(s).   The patient does have a current PCP [name](Yun Sun, MD) and [facility] after discharge.  The patient does have transportation limitations that hinder transportation to clinic appointments.    LOS: 3 days  Darreld Mclean, MD 09/21/2015, 9:20 AM

## 2015-09-21 NOTE — Clinical Documentation Improvement (Signed)
Internal Medicine Teaching Service  Please document query responses in the progress notes and discharge summary, not on the CDI BPA form in [facility]. Thank you!  Please document in a condition below provides greater specificity regarding the patient's "feel this is likely related to volume overload secondary to her Afib RVR" documented in the progress note dated 09/21/15 by Dr. Allena KatzPatel  - Acute diastolic heart failure secondary to atrial fibrillation with RVR  - Other condition  - Unable to clinically determine  Please exercise your independent, professional judgment when responding. A specific answer is not anticipated or expected.   Thank You, Jerral Ralphathy R [place] Franson  RN BSN CCDS [phone] Health Information Management [place]

## 2015-09-21 NOTE — Care Management Important Message (Signed)
Important Message  Patient Details  Name: [name] MRN: [id number] Date of Birth: 08/07/1940   Medicare Important Message Given:  Yes    [name], Lashawnda Hancox [name] 09/21/2015, 10:51 AM

## 2015-09-22 DIAGNOSIS — J90 Pleural effusion, not elsewhere classified: Secondary | ICD-10-CM

## 2015-09-22 LAB — BASIC METABOLIC PANEL
Anion gap: 5 (ref 5–15)
BUN: 12 mg/dL (ref 6–20)
CALCIUM: 8.1 mg/dL — AB (ref 8.9–10.3)
CHLORIDE: 97 mmol/L — AB (ref 101–111)
CO2: 36 mmol/L — ABNORMAL HIGH (ref 22–32)
CREATININE: 0.85 mg/dL (ref 0.44–1.00)
GFR calc non Af Amer: >60 mL/min (ref >60)
Glucose, Bld: 95 mg/dL (ref 65–99)
Potassium: 3.9 mmol/L (ref 3.5–5.1)
SODIUM: 138 mmol/L (ref 135–145)

## 2015-09-22 MED ORDER — SENNOSIDES-DOCUSATE SODIUM 8.6-50 MG PO TABS
2.0000 | ORAL_TABLET | Freq: Every day | ORAL | Status: DC
  Administered 2015-09-22: 2 via ORAL
  Filled 2015-09-22: qty 2

## 2015-09-22 MED ORDER — POLYETHYLENE GLYCOL 3350 17 G PO PACK
17.0000 g | PACK | Freq: Every day | ORAL | Status: DC | PRN
Start: 2015-09-22 — End: 2015-09-23

## 2015-09-22 NOTE — Progress Notes (Signed)
Patient Name: [name] Date of Encounter: 09/22/2015  Hospital Problem List     Active Problems:   CAP (community acquired pneumonia)   Atrial fibrillation with RVR (HCC)    Subjective   Feels ok, no chest pain or dyspnea reported.  Inpatient Medications    . apixaban  5 mg Oral BID  . diltiazem  30 mg Oral Q6H  . furosemide  20 mg Oral Daily  . levothyroxine  50 mcg Oral QAC breakfast  . lubiprostone  24 mcg Oral QPM  . metoprolol  25 mg Oral BID  . pantoprazole  40 mg Oral Daily  . rosuvastatin  5 mg Oral QPM  . sodium chloride flush  3 mL Intravenous Q12H    Vital Signs    Filed Vitals:   09/21/15 2158 09/21/15 2351 09/22/15 0500 09/22/15 0547  BP: 124/69 95/53  109/61  Pulse: 150   94  Temp:    97.8 F (36.6 C)  TempSrc:    Oral  Resp:    18  Height:      Weight:   86 lb 14.4 oz (39.418 kg)   SpO2:    95%    Intake/Output Summary (Last 24 hours) at 09/22/15 0807 Last data filed at 09/21/15 2356  Gross per 24 hour  Intake    250 ml  Output    200 ml  Net     50 ml   Filed Weights   09/18/15 0848 09/22/15 0500  Weight: 90 lb (40.824 kg) 86 lb 14.4 oz (39.418 kg)    Physical Exam    General: Pleasant frail older female, NAD. Neuro: Alert and oriented X 3. Moves all extremities spontaneously. Psych: Normal affect. HEENT:  Normal  Neck: Supple without bruits or JVD. Lungs:  Resp regular and unlabored, CTA. Heart: Irregularly irregular,no s3, s4, or murmurs. Abdomen: Soft, non-tender, non-distended, BS + x 4.  Extremities: No clubbing, cyanosis or edema. DP/PT/Radials 2+ and equal bilaterally.  Labs    CBC  Recent Labs  09/21/15 0249  WBC 6.7  HGB 10.0*  HCT 36.0  MCV 76.9*  PLT 208   Basic Metabolic Panel  Recent Labs  09/21/15 0249 09/22/15 0330  NA 138 138  K 4.9 3.9  CL 99* 97*  CO2 31 36*  GLUCOSE 94 95  BUN 17 12  CREATININE 1.16* 0.85  CALCIUM 8.3* 8.1*    Telemetry    A-Fib RVR with intermittent episodes of  bradycardia into the 40-50s.  ECG    No recent  Radiology    Dg Chest 2 View  09/21/2015  CLINICAL DATA:  Shortness of breath. EXAM: CHEST  2 VIEW COMPARISON:  09/21/2015 09/18/2015.  CT 09/19/2015. FINDINGS: Prior CABG. Cardiomegaly. Left lower lobe atelectasis and or infiltrate again noted. Slight improvement from prior exam. Prior CABG. Mild cardiomegaly with mild bilateral interstitial prominence and bilateral pleural effusions, left side greater right. These findings suggest congestive heart failure . These findings have improved slightly from prior exam. No pneumothorax. Prior cervical spine fusion . IMPRESSION: 1. Left lower lobe atelectasis and or infiltrate with slight improvement from prior exam. 2. Prior CABG. Mild cardiomegaly. Mild interstitial prominence and bilateral pleural effusions, left side greater right. These changes have improved slightly from prior exam suggesting improving congestive heart failure. Electronically Signed   By: Maisie Fus  Register   On: 09/21/2015 07:51  Assessment & Plan    1. Atrial fibrillation: Continues to remain in A-fib RVR with transtient episodes  of bradycardia. She remains asymptomatic with this being without chest pain or DOE. PO Dilitiazem added yesterday, and digoxin stopped. Metoprolol decreased to 25mg  BID. Eliquis continued for anticoagulation.  --CHADSvasc 8 --Rate has continued to remain uncontrolled even with adjustment of medical therapy. Will likely need AV node ablation and possible pacemaker. Will discuss this with Dr. Jens Somrenshaw. Will consult EP.   2 Possible pneumonia: per primary service.  3 CAD: continue statin; given need for anticoagulation, no ASA given need for anticoagulation.  4 Pleural effusions: Cr stable from 1.16>>0.85 --continue low dose lasix and follow renal function.  Signed, Laverda PageLindsay Roberts NP-C Pager [phone] As above, patient seen and examined; no CP or dyspnea; remains in permanent atrial fibrillation;  cardizem added to lopressor yesterday and pt continues with episodes of tachycardia with HR 160s at times and at other times 40s. 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 Olga MillersBrian Crenshaw

## 2015-09-22 NOTE — Discharge Summary (Signed)
Name: Brenda RinksRobina B Barcellos MRN: [id number] DOB: 08/28/1940 75 y.o. PCP: Salli RealYun Sun, MD  Date of Admission: 09/18/2015  8:49 AM Date of Discharge: 09/24/2015 Attending Physician: Earl LagosNischal Narendra, MD  Discharge Diagnosis: 1. Atrial fibrillation with RVR 2. Left sided pleural effusion Principal Problem:   Atrial fibrillation with RVR (HCC) Active Problems:   2 Vessel CAD - s/p CABG; LIMA-LAD, SVG-RCA   Hypothyroidism   Pleural effusion  Discharge Medications:   Medication List    STOP taking these medications        aspirin 81 MG EC tablet     digoxin 0.125 MG tablet  Commonly known as:  LANOXIN      TAKE these medications        AMITIZA 24 MCG capsule  Generic drug:  lubiprostone  Take 24 mcg by mouth every evening.     apixaban 5 MG Tabs tablet  Commonly known as:  ELIQUIS  Take 1 tablet (5 mg total) by mouth 2 (two) times daily.     diltiazem 120 MG 24 hr capsule  Commonly known as:  CARDIZEM CD  Take 1 capsule (120 mg total) by mouth daily.     feeding supplement Liqd  Take 1 Container by mouth 3 (three) times daily between meals.     furosemide 20 MG tablet  Commonly known as:  LASIX  Take 1 tablet (20 mg total) by mouth every other day.     levothyroxine 50 MCG tablet  Commonly known as:  SYNTHROID, LEVOTHROID  Take 1 tablet (50 mcg total) by mouth daily before breakfast.     metoprolol 50 MG tablet  Commonly known as:  LOPRESSOR  Take 25 mg by mouth 2 (two) times daily.     oxybutynin 5 MG tablet  Commonly known as:  DITROPAN  Take 5 mg by mouth 2 (two) times daily as needed for bladder spasms.     oxyCODONE-acetaminophen 5-325 MG tablet  Commonly known as:  PERCOCET/ROXICET     PRESERVISION AREDS 2 Caps  Take 1 capsule by mouth 2 (two) times daily.     rosuvastatin 5 MG tablet  Commonly known as:  CRESTOR  TAKE 1 TABLET BY MOUTH IN THE EVENING     senna-docusate 8.6-50 MG tablet  Commonly known as:  Senokot-S  Take 1 tablet by mouth at bedtime as  needed for mild constipation.     Vitamin D3 2000 units Tabs  Take 1 tablet by mouth daily.        Disposition and follow-up:   Ms.[name] was [facility] in Stable condition.  At the hospital follow up visit please address:  1.  Follows up with PCP and Cardiology. Afib: Rate adequately controlled on Metoprolol 25 mg BID and Diltiazem 120 mg daily. Continues Eliquis 5 mg BID.  Pleural Effusion: Continues Lasix 20 mg daily until follow up to reassess Lasix need. Will need repeat CXR 2-4 weeks post-discharge to reassess effusion size. Repeat BMP for potassium and renal function while on lasix.   2.  Labs / imaging needed at time of follow-up: BMP, CXR about 2 weeks post-discharge  3.  Pending labs/ test needing follow-up: none  Follow-up Appointments: Follow-up Information    Follow up with Corine ShelterLuke Kilroy, PA-C On 09/30/2015.   Specialties:  Cardiology, Radiology   Why:  9:30am for you hospital follow up   Contact information:   [address] [place] [address] [phone]  Follow up with Bryan Lemma, MD On 12/13/2015.   Specialty:  Cardiology   Why:  11am for your regular follow up   Contact information:   [address] [place] [address] [phone]       Follow up with [name], MD. Go on 09/30/2015.   Specialty:  Internal Medicine   Why:  At 10 AM with Jacob Moores, PA for hospital follow-up   Contact information:   [address] [place] [address] [phone]       Follow up with [facility].   Why:  home- 02 arranged- portable tank to be delivered to room prior to discharge   Contact information:   [address] [place] [address] [phone]       Discharge Instructions: Discharge Instructions    Call MD for:  difficulty breathing, headache or visual disturbances    Complete by:  As directed      Call MD for:  extreme fatigue    Complete by:   As directed      Call MD for:  persistant dizziness or light-headedness    Complete by:  As directed      Call MD for:  persistant nausea and vomiting    Complete by:  As directed      Call MD for:  severe uncontrolled pain    Complete by:  As directed      Call MD for:  temperature >100.4    Complete by:  As directed      Diet - low sodium heart healthy    Complete by:  As directed      Face-to-face encounter (required for Medicare/Medicaid patients)    Complete by:  As directed   I [name] certify that this patient is under my care and that I, or a nurse practitioner or physician's assistant working with me, had a face-to-face encounter that meets the physician face-to-face encounter requirements with this patient on 09/23/2015. The encounter with the patient was in whole, or in part for the following medical condition(s) which is the primary reason for home health care (List medical condition): Atrial Fibrillation, deconditioning.  The encounter with the patient was in whole, or in part, for the following medical condition, which is the primary reason for home health care:  Atrial fibrillation, generalized weakness  I certify that, based on my findings, the following services are medically necessary home health services:  Physical therapy  Reason for Medically Necessary Home Health Services:  Therapy- Therapeutic Exercises to Increase Strength and Endurance  My clinical findings support the need for the above services:  Unable to leave home safely without assistance and/or assistive device  Further, I certify that my clinical findings support that this patient is homebound due to:  Unable to leave home safely without assistance     Home Health    Complete by:  As directed   To provide the following care/treatments:  PT     Increase activity slowly    Complete by:  As directed            Consultations: Treatment Team:  Rounding Lbcardiology, MD  Procedures Performed:  Dg Chest  2 View  09/21/2015  CLINICAL DATA:  Shortness of breath. EXAM: CHEST  2 VIEW COMPARISON:  09/21/2015 09/18/2015.  CT 09/19/2015. FINDINGS: Prior CABG. Cardiomegaly. Left lower lobe atelectasis and or infiltrate again noted. Slight improvement from prior exam. Prior CABG. Mild cardiomegaly with mild bilateral  interstitial prominence and bilateral pleural effusions, left side greater right. These findings suggest congestive heart failure . These findings have improved slightly from prior exam. No pneumothorax. Prior cervical spine fusion . IMPRESSION: 1. Left lower lobe atelectasis and or infiltrate with slight improvement from prior exam. 2. Prior CABG. Mild cardiomegaly. Mild interstitial prominence and bilateral pleural effusions, left side greater right. These changes have improved slightly from prior exam suggesting improving congestive heart failure. Electronically Signed   By: Maisie Fus  Register   On: 09/21/2015 07:51   Dg Chest 2 View  09/18/2015  CLINICAL DATA:  Cardiac palpitations. EXAM: CHEST  2 VIEW COMPARISON:  Radiograph of September 11, 2015. FINDINGS: Stable cardiomegaly. Status post coronary artery bypass graft. No pneumothorax is noted. Increased left basilar opacity is noted concerning for pneumonia or atelectasis with associated pleural effusion. Stable mild right pleural effusion is noted with associated subsegmental atelectasis. Bony thorax is unremarkable. IMPRESSION: Increased left basilar opacity is noted concerning for worsening pneumonia or atelectasis with associated pleural effusion. Mild right pleural effusion is noted with associated subsegmental atelectasis. Electronically Signed   By: Lupita Raider, M.D.   On: 09/18/2015 09:47   Dg Chest 2 View  09/11/2015  CLINICAL DATA:  Left arm pain for 2 weeks, chronic back pain EXAM: CHEST  2 VIEW COMPARISON:  04/06/2015 FINDINGS: Cardiomegaly again noted. Status post CABG. There is small left pleural effusion with left basilar atelectasis or  infiltrate. Osteopenia and mild degenerative change thoracic spine. No pulmonary edema IMPRESSION: Cardiomegaly. Status post CABG. No pulmonary edema. Small left pleural effusion with left basilar atelectasis or infiltrate. Electronically Signed   By: Natasha Mead M.D.   On: 09/11/2015 11:43   Ct Chest Wo Contrast  09/19/2015  CLINICAL DATA:  Bilateral pleural effusions.  Follow-up exam. EXAM: CT CHEST WITHOUT CONTRAST TECHNIQUE: Multidetector CT imaging of the chest was performed following the standard protocol without IV contrast. COMPARISON:  Chest radiograph, 09/18/2015.  Chest CT, 11/18/2014. FINDINGS: Neck base and axilla: Sub cm thyroid nodules, with a calcified left nodule, similar to the prior CT. No mass or adenopathy. Mediastinum and hila: There changes from previous cardiac surgery. Dense coronary artery calcifications are noted. The heart is mildly enlarged. Atherosclerotic calcifications are noted along the thoracic aorta. There are several prominent to mildly enlarged lymph nodes. There are adjacent right peritracheal azygos level lymph nodes in the precarinal node that measure 1 cm short axis. There is a subcarinal node measuring 14 mm in short axis. No mediastinal or hilar masses. No discrete enlarged hilar lymph nodes. Lungs and pleura: Moderate, left greater than right, pleural effusions. Advanced changes of centrilobular emphysema. There is dependent lung opacity most evident in the lower lobes, left greater than right, consistent with atelectasis. Bronchial wall thickening in the lower lobes right middle lobe is also noted. There is no convincing pneumonia or pulmonary edema. No pneumothorax. No discrete mass or suspicious nodule. Limited upper abdomen: No acute finding. Left renal upper pole cyst. Musculoskeletal: Bony demineralization. Degenerative changes of the visualized spine. Minor wedge-shaped compression deformity of a mid thoracic vertebra, stable. No osteoblastic or osteolytic  lesions. IMPRESSION: 1. Moderate, left greater than right, pleural effusions with associated dependent atelectasis, most evident in the left lower lobe. 2. No convincing pneumonia or pulmonary edema. 3. Advanced emphysema. 4. Mild mediastinal adenopathy, most likely reactive. 5. Mild cardiomegaly, stable changes from previous cardiac surgery and dense coronary artery calcifications. Electronically Signed   By: Renard Hamper.D.  On: 09/19/2015 10:55   Dg Chest Left Decubitus  09/21/2015  CLINICAL DATA:  Shortness of breath.  Pleural effusion. EXAM: CHEST - LEFT DECUBITUS COMPARISON:  09/21/2015.  CT 09/19/2015. FINDINGS: A small moderate layering left pleural effusion is noted. Reference made to a PA lateral chest x-ray report for other findings. IMPRESSION: Small layering left pleural effusion. Electronically Signed   By: Maisie Fus  Register   On: 09/21/2015 07:52    2D Echo: n/a  Cardiac Cath: n/a  Admission HPI: Ms. Mirabella Decicco is a 75 year old [place] woman with PMH of 2 vessel CAD s/p CABG, Permanent Atrial Fibrillation on Eliquis, ICM (EF 50 % on TTE 04/06/15), Pulm HTN (PA peak pressuer 59 mmHg), PAD, CVA, COPD Gold Stage 3 (FEV1 45%, FEV1/FVC 56% in 2013), HTN, and HLD who presents with palpitations. Patient states she was at her usual state of health yesterday, resting at home, when she suddenly felt nauseous. She had dry heaves, but no vomiting. She felt her heart racing and skipping beats. This felt similar to her prior episodes of her atrial fibrillation attacks. The sensation subsided on its own after about 10-15 minutes. Her daughter called their Cardiologist's office (Dr. Herbie Baltimore) who recommended that patient take an extra 1/2 tablet of her metoprolol over the next 3 days. She has also been on Digoxin, which was discontinued per Cardiology note from April, but this has apparently been restarted. Patient was doing well until about 4 am this morning when her symptoms returned. She also reports  a two day history of cough productive of dark sputum. At this time, they decided to come to the ED. She denied any chest pain, shortness of breath, fevers, chills, diaphoresis, or syncope.  In the ED, patient was noted to be in Afib with RVR with rate ~140-150 bpm. Patient was started on a diltiazem drip after initial bolus. She had cardioversion on drip and this was discontinued. Digoxin level was <0.2, TSH 1.314, free T4 1.63, white count 10.9. CXR showed a left sided basilar opacity concerning for pneumonia or atelectasis with pleural effusion. She was started on IV Ceftriaxone and Azithromycin.  Social Hx: former smoker 5 cigarettes/day, quit 18 years ago; no current alcohol, occasional use in past; no illicit drug use  Family Hx: Mother with MI and CVA, Father MI in 37s, Brother with "large heart"  Hospital Course by problem list: Principal Problem:   Atrial fibrillation with RVR (HCC) Active Problems:   2 Vessel CAD - s/p CABG; LIMA-LAD, SVG-RCA   Hypothyroidism   Pleural effusion   AFib with RVR: CHADSVASc score 8. Patient with known history of Afib on Eliquis. Initially on Diltiazem drip in the ED which was discontinued after improved rate control and resultant hypotension. 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 Cardiology were consulted. EP were also consulted to evaluate for possible pacemaker placement, however this was not pursued as patient was deemed a poor candidate and rates were improved with medication adjustment. Medications were adjusted until adequate rate control achieved with Metoprolol 25 BID and Diltiazem 120 mg once daily. Amiodarone was initially started, but discontinued as patient refused due to visual disturbance in the past. Home Digoxin was discontinued. Eliquis 5 mg twice daily was continued for anticoagulation. Cardiology discontinued home Aspirin.   Left pleural effusion: Patient with CXR findings of  left sided opacity concerning for worsened pneumonia with pleural effusion compared to imaging from 1 week prior to admission. She reported a  2 day history of cough productive of dark sputum, unsure of purulence. She denied any fevers, SOB, or chills. She was started on IV Ceftriaxone and Azithromycin, of which she completed 3 days. CT chest showed moderate bilateral pleural effusions, left greater than right without convincing evidence for pneumonia. Antibiotics were discontinued and low dose Lasix was started. Repeat 2V CXR on 6/20 showed slight improvement in effusions. Lateral decubitus showed a small layering left pleural effusion. Thoracentesis was not pursued in light of improving effusion and size. Effusion was felt to be related to diastolic HF secondary to her Afib RVR. She was continued on Lasix 20 mg daily on discharge with follow up with PCP. Home oxygen was arranged due to desaturating at rest and ambulation. Will need repeat CXR 2 weeks post-discharge to evaluate for improvement vs worsening effusion.  CAD s/p CABG and MI: Denied any chest pain. EKG was without acute ischemic changes and Troponins were negative x 3. ASA 81 mg was d/c'ed per cards.  Hypothyroidism:TSH 1.314, free T4 elevated at 1.63. Was on Synthroid 75 mcg daily at home. Decreased Synthroid to 50 mcg po daily in setting of Afib RVR, increased T4, elderly age and low weight.    Discharge Vitals:   BP 116/64 mmHg  Pulse 81  Temp(Src) 97.9 F (36.6 C) (Oral)  Resp 18  Ht 5' (1.524 m)  Wt 85 lb 6.4 oz (38.737 kg)  BMI 16.68 kg/m2  SpO2 92%  Discharge Labs:  No results found for this or any previous visit (from the past 24 hour(s)).  Signed: Darreld Mclean, MD 09/24/2015, 11:45 AM    Services Ordered on Discharge: HHPT Equipment Ordered on Discharge: Home Oxygen, Rolling Walker

## 2015-09-22 NOTE — Consult Note (Signed)
ELECTROPHYSIOLOGY CONSULT NOTE    Patient ID: [name] MRN: [id number], DOB/AGE: 76/24/1942 76 y.o.  Admit date: 09/18/2015 Date of Consult: [name] is a 75 y.o. female  Primary Physician: [name], MD Primary Cardiologist: Dr. Herbie Baltimore Requesting MD: Dr. Vernie Murders  Reason for Consultation: AFib, evaluate possible tachy-brady  HPI: [name] is a 75 y.o. female with PMHx of AFib decribed as chronic, CAD/CABG in 2009,ICM, , CBP, PVD, hypothyroidism, CVA (while off her a/c for a procedure) and TIA, COPD w/P.HTN was admitted to [facility] on 09/18/15 for CAP.  Cardiology was consulted to the case secondary to AFib with RVR.  EP is being asked to evaluated given both rapid rates and pauses observed.  The patient is feeling better today and wants to go home.  She denies any symptoms of bradycardia here or historically.  She states the day ofher admission she was feeling sick to her stomach with a couple episodes of wretching without vomiting, this being very unusual for her so she came in.  She denies symptoms of illness such as fever, no CP and no overt feeling pf palpitations though her daughter states she has described her heart flip-flopping on/off.  The patient denies baseline SOB, though had noticed in the last 2 weeks getting unusually winded even with short walks of 20 feet or so, and thinks over the last couple months possibly generally more sluggish then usual.  She last saw Dr. Herbie Baltimore in April, he mentioned historically having some rate control issues but as of that visit felt better controlled, 70bpm at that visit  The patient states he had mentioned to her some time ago she may need a pacer but she did not want one because it would be so bulky on her small frame.  Admit LABS: K+ 4.6 Mag 1.9 BUN/Creat 13/0.9 WBC 10.9 >> 6.7 H/H 11/41 Dig level <0.2 TSH 1.314 Trop <0.03 x3  Past Medical History  Diagnosis Date  . S/P CABG x 2 2009    LIMA-LAD, SVG-RCA, with AV fistula  ligation and Maze procedure  . CAD (coronary artery disease), native coronary artery 2009    75% LAD, diffuse 75-90% RCA --> Referred for CABG + MAZE;  Cardiolite 12/2013: No ischemia or Infarction  . Ischemic cardiomyopathy 2012    EF ~40-45% by Echo  . Persistent atrial fibrillation (HCC) 04/05/2012    Now permanent A. fib s/p MAZE -- recurrence, cardioversion-converted to sinus bradycardia --> Now persistent  . Essential hypertension   . Dyslipidemia, goal LDL below 70     On Crestor, followed by PCP  . COPD (chronic obstructive pulmonary disease) (HCC)   . PAD (peripheral artery disease) (HCC) 2007    s/p L Ileac A stent ; most recent Dopplers July 2012: Less than 50% reduction bilaterally. ABI 0.96 on the right 0.88 on left;;;12/27/2011   -ABI right .87 and left ABI .78  ,LEFT CIA and EIA stent normall patency, left CFA,SFA,and popliteal 0-49%; rgt proximal SFA 50-69%,rgt CIA,EIA, and CFA 0-49%  . Chronic low back pain     scoliosis & lordosis  . H/O: pneumonia   . Hypothyroidism   . Anxiety   . Cervical neck pain with evidence of disc disease     with need for surgery -- November 2015  . Osteoarthritis of back     And neck, hands,spine  . H/O ischemic left MCA stroke 11/18/2014    MRI/MRA of head: Multifocal left MCA infarct.. Also possible left ACA  territory infarct -- complete occlusion of left MCA distally at M2 level. Marked left ACA attenuation.  Marland Kitchen TIA (transient ischemic attack) 04/05/2015  . Pulmonary hypertension (HCC)     Estimated PA pressure on Echo January 2017 = 59 mmHg with dilated IVC, severely dilated RA and moderate TR.  . Stroke (HCC)   . CKD (chronic kidney disease)      Surgical History:  Past Surgical History  Procedure Laterality Date  . Iliac artery stent Left 04/06/2005    Ex Iliac - CFA (Smart STENTS -- 7x4 in EIA, 6 x 3 CFA)   . Coronary artery bypass graft  2009    LIMA-LAD, SVG-RCA (also MAZE & AV fistula ligation)  . Maze  2009    along with CABG    . Cardioversion  04/05/2012    Procedure: CARDIOVERSION;  Surgeon: Thurmon Fair, MD;  Location: [facility] ENDOSCOPY;  Service: Cardiovascular;  Laterality: N/A;  . Transthoracic echocardiogram  Jan 2012    EF ~40-45%, global HK; PAP ~45-50 mmHg  . Nm myoview ltd  May 2013    No ischemia or Infarct  . Abdominal hysterectomy    . Cardiac catheterization  07/30/2007    75% LAD ,3 stenoses of 75-90% in  RCA;circumflex from proximal RCA with no lesion seen,normal ramus intermediate branh; normal LV systoilc function  . Nm cardiolite ltd  12/2013    Non-gated for Afib; No Ischemia or Infarction.  Marland Kitchen Anterior cervical decomp/discectomy fusion N/A 01/23/2014    Procedure: ANTERIOR CERVICAL DECOMPRESSION/DISCECTOMY FUSION CERVICAL FOUR-FIVE,CERVICAL FIVE-SIX,CERVICAL SIX-SEVEN;  Surgeon: Mariam Dollar, MD;  Location: [facility] NEURO ORS;  Service: Neurosurgery;  Laterality: N/A;  . Colonoscopy with propofol N/A 11/12/2014    Procedure: COLONOSCOPY WITH PROPOFOL;  Surgeon: Charna Elizabeth, MD;  Location: [facility] ENDOSCOPY;  Service: Endoscopy;  Laterality: N/A;  . Transthoracic echocardiogram  Jan 2017    EF ~50%. No RWMA, Afib. Paradoxical Septal motion. no DD assessment. Mod LA dilation.  PAP ~59 mmHg & dilated IVC. Mod TR. Severe RA dilation.     Prescriptions prior to admission  Medication Sig Dispense Refill Last Dose  . AMITIZA 24 MCG capsule Take 24 mcg by mouth every evening.    09/17/2015 at Unknown time  . apixaban (ELIQUIS) 5 MG TABS tablet Take 1 tablet (5 mg total) by mouth 2 (two) times daily. 60 tablet 0 09/18/2015 at 8p  . aspirin EC 81 MG EC tablet Take 1 tablet (81 mg total) by mouth daily. 30 tablet 0 09/17/2015 at Unknown time  . Cholecalciferol (VITAMIN D3) 2000 units TABS Take 1 tablet by mouth daily.   09/17/2015 at Unknown time  . digoxin (LANOXIN) 0.125 MG tablet Take 1 tablet by mouth every morning.    09/17/2015 at Unknown time  . levothyroxine (SYNTHROID, LEVOTHROID) 75 MCG tablet Take 75 mcg by mouth  daily before breakfast.   09/17/2015 at Unknown time  . metoprolol (LOPRESSOR) 50 MG tablet Take 25 mg by mouth 2 (two) times daily.   09/17/2015 at 8p  . Multiple Vitamins-Minerals (PRESERVISION AREDS 2) CAPS Take 1 capsule by mouth 2 (two) times daily.   09/17/2015 at Unknown time  . oxybutynin (DITROPAN) 5 MG tablet Take 5 mg by mouth 2 (two) times daily as needed for bladder spasms.   Past Week at Unknown time  . oxyCODONE-acetaminophen (PERCOCET/ROXICET) 5-325 MG tablet    Past Week at Unknown time  . rosuvastatin (CRESTOR) 5 MG tablet TAKE 1 TABLET BY MOUTH IN THE EVENING 30  tablet 2 09/17/2015 at Unknown time  . senna-docusate (SENOKOT-S) 8.6-50 MG tablet Take 1 tablet by mouth at bedtime as needed for mild constipation. 30 tablet 0 09/17/2015 at Unknown time    Inpatient Medications:  . apixaban  5 mg Oral BID  . diltiazem  30 mg Oral Q6H  . furosemide  20 mg Oral Daily  . levothyroxine  50 mcg Oral QAC breakfast  . lubiprostone  24 mcg Oral QPM  . metoprolol  25 mg Oral BID  . pantoprazole  40 mg Oral Daily  . rosuvastatin  5 mg Oral QPM  . sodium chloride flush  3 mL Intravenous Q12H    Allergies: No Known Allergies  Social History   Social History  . Marital Status: Widowed    Spouse Name: N/A  . Number of Children: N/A  . Years of Education: N/A   Occupational History  . Not on file.   Social History Main Topics  . Smoking status: Former Smoker -- 0.50 packs/day    Types: Cigarettes    Quit date: 01/07/1998  . Smokeless tobacco: Never Used  . Alcohol Use: No  . Drug Use: No  . Sexual Activity: Not on file   Other Topics Concern  . Not on file   Social History Narrative    She is a divorced mother of 1 and one child who died. Her daughter is here with her   today. She has got 4 grandchildren. She is a native of [place] and only has an occasional alcoholic   beverage. She does not get routine exercise mostly because of her spine pain. She did previously smoke  but quit many years ago.      Family History  Problem Relation Age of Onset  . Heart attack Mother     Late 57s  . Stroke Mother   . Stroke Father   . Heart attack Father     26s  . Heart disease Brother 60    Cardiomegaly  . Stroke Sister   . Heart attack Son 77     Review of Systems: All other systems reviewed and are otherwise negative except as noted above.  Physical Exam: Filed Vitals:   09/21/15 2158 09/21/15 2351 09/22/15 0500 09/22/15 0547  BP: 124/69 95/53  109/61  Pulse: 150   94  Temp:    97.8 F (36.6 C)  TempSrc:    Oral  Resp:    18  Height:      Weight:   86 lb 14.4 oz (39.418 kg)   SpO2:    95%    GEN- The patient is frail, very thin body habitus, is well appearing, alert and oriented x 3 today.   HEENT: normocephalic, atraumatic; sclera clear, conjunctiva pink; hearing intact; oropharynx clear; neck supple, no JVP Lymph- no cervical lymphadenopathy Lungs- Clear to ausculation bilaterally, normal work of breathing.  No wheezes, rales, rhonchi Heart- Irregular rate and rhythm, no significant murmurs, no rubs or gallops GI- soft, non-tender, non-distended Extremities- no clubbing, cyanosis, or edema MS- no significant deformity or atrophy Skin- warm and dry, no rash or lesion Psych- euthymic mood, full affect Neuro- no gross deficits observed  Labs:   Lab Results  Component Value Date   WBC 6.7 09/21/2015   HGB 10.0* 09/21/2015   HCT 36.0 09/21/2015   MCV 76.9* 09/21/2015   PLT 208 09/21/2015    Recent Labs Lab 09/22/15 0330  NA 138  K 3.9  CL 97*  CO2 36*  BUN 12  CREATININE 0.85  CALCIUM 8.1*  GLUCOSE 95      Radiology/Studies:  Dg Chest 2 View 09/21/2015  CLINICAL DATA:  Shortness of breath. EXAM: CHEST  2 VIEW COMPARISON:  09/21/2015 09/18/2015.  CT 09/19/2015. FINDINGS: Prior CABG. Cardiomegaly. Left lower lobe atelectasis and or infiltrate again noted. Slight improvement from prior exam. Prior CABG. Mild cardiomegaly with mild  bilateral interstitial prominence and bilateral pleural effusions, left side greater right. These findings suggest congestive heart failure . These findings have improved slightly from prior exam. No pneumothorax. Prior cervical spine fusion . IMPRESSION: 1. Left lower lobe atelectasis and or infiltrate with slight improvement from prior exam. 2. Prior CABG. Mild cardiomegaly. Mild interstitial prominence and bilateral pleural effusions, left side greater right. These changes have improved slightly from prior exam suggesting improving congestive heart failure. Electronically Signed   By: Maisie Fus  Register   On: 09/21/2015 07:51    Ct Chest Wo Contrast 09/19/2015  CLINICAL DATA:  Bilateral pleural effusions.  Follow-up exam. EXAM: CT CHEST WITHOUT CONTRAST TECHNIQUE: Multidetector CT imaging of the chest was performed following the standard protocol without IV contrast. COMPARISON:  Chest radiograph, 09/18/2015.  Chest CT, 11/18/2014. FINDINGS: Neck base and axilla: Sub cm thyroid nodules, with a calcified left nodule, similar to the prior CT. No mass or adenopathy. Mediastinum and hila: There changes from previous cardiac surgery. Dense coronary artery calcifications are noted. The heart is mildly enlarged. Atherosclerotic calcifications are noted along the thoracic aorta. There are several prominent to mildly enlarged lymph nodes. There are adjacent right peritracheal azygos level lymph nodes in the precarinal node that measure 1 cm short axis. There is a subcarinal node measuring 14 mm in short axis. No mediastinal or hilar masses. No discrete enlarged hilar lymph nodes. Lungs and pleura: Moderate, left greater than right, pleural effusions. Advanced changes of centrilobular emphysema. There is dependent lung opacity most evident in the lower lobes, left greater than right, consistent with atelectasis. Bronchial wall thickening in the lower lobes right middle lobe is also noted. There is no convincing pneumonia  or pulmonary edema. No pneumothorax. No discrete mass or suspicious nodule. Limited upper abdomen: No acute finding. Left renal upper pole cyst. Musculoskeletal: Bony demineralization. Degenerative changes of the visualized spine. Minor wedge-shaped compression deformity of a mid thoracic vertebra, stable. No osteoblastic or osteolytic lesions. IMPRESSION: 1. Moderate, left greater than right, pleural effusions with associated dependent atelectasis, most evident in the left lower lobe. 2. No convincing pneumonia or pulmonary edema. 3. Advanced emphysema. 4. Mild mediastinal adenopathy, most likely reactive. 5. Mild cardiomegaly, stable changes from previous cardiac surgery and dense coronary artery calcifications. Electronically Signed   By: Amie Portland M.D.   On: 09/19/2015 10:55   Dg Chest Left Decubitus 09/21/2015  CLINICAL DATA:  Shortness of breath.  Pleural effusion. EXAM: CHEST - LEFT DECUBITUS COMPARISON:  09/21/2015.  CT 09/19/2015. FINDINGS: A small moderate layering left pleural effusion is noted. Reference made to a PA lateral chest x-ray report for other findings. IMPRESSION: Small layering left pleural effusion. Electronically Signed   By: Maisie Fus  Register   On: 09/21/2015 07:52    EKG:  #1 AFib 148bpm #2 AFib 146bpm  TELEMETRY:  AFib, RVR 140-160's, intermittently noting rates 50's (40's mentioned in notes), very brief 2 second pauses  04/06/15: Echocardiogram:  Study Conclusions - Left ventricle: The cavity size was normal. Systolic function was  mildly to moderately reduced. The estimated ejection fraction was  50%. Wall motion was  normal; there were no regional wall motion  abnormalities. The study was not technically sufficient to allow  evaluation of LV diastolic dysfunction due to atrial  fibrillation. - Ventricular septum: Septal motion showed paradox. - Aortic valve: There was no regurgitation. - Aortic root: The aortic root was normal in size. - Mitral valve:  Calcified annulus. Mildly thickened leaflets .  Transvalvular velocity was within the normal range. There was no  evidence for stenosis. - Left atrium: The atrium was moderately dilated. 38mm - Right ventricle: The cavity size was moderately dilated. Wall  thickness was normal. Systolic function was moderately reduced. - Right atrium: The atrium was severely dilated. - Tricuspid valve: There was moderate regurgitation. - Pulmonic valve: Structurally normal valve. There was mild  regurgitation. - Pulmonary arteries: Systolic pressure was severely increased. PA  peak pressure: 59 mm Hg (S). - Inferior vena cava: The vessel was dilated. The respirophasic  diameter changes were blunted (< 50%), consistent with elevated  central venous pressure. - Pericardium, extracardiac: There was no pericardial effusion.  12/16/13: Lexiscan stress Overall Impression: Normal stress nuclear study.  LV Ejection Fraction: Study not gated. LV Wall Motion: NA  Assessment and Plan:   1. Permanent AFib. ?tachy-brady     CHA2DS2Vasc is at least 7, on Eliquis     Out patient on dig and lopressor     Initially her lopressor was up-titrated, it looks like a single dose of amiodarone and discontinued (patient reported eye issues historically on it and would not consider retrying it)     Low Diltiazem started and dig restarted yesterday notes report episodes of bradycardia 40's with 2 second pauses the dig stopped and BB decreased, persistent fast rates noted as well     Discussed possibility of PPM to allow better management of her RVR, she is not completely unagreeable to the possibility at this point, her BP may be a limiting factor for rate controlling meds     pulm status perhaps plays a role in rate this admission Will discuss further with Dr. Ladona Ridgelaylor   2. CAP?      CT without convincing evidence of pneumonia, also mentions advanced emphysema  3. Pleural effusions     last cxr showed improvement  on lasix      The patient carries DNR status  Signed, Francis DowseRenee Ursuy, PA-C 09/22/2015 9:24 AM  EP Attending  Patient seen and examined. Agree with above. On exam she is frail, elderly woman, with an IRIR rhythm, a soft systolic murmur, S2 gallop, minimal rales, trace edema, and soft abdomen with bowel sounds present.  A/P 1. Tachybrady syndrome with mostly tachy, now improved on both short acting calcium channel blockers and beta blockers. 2. Chronic atrial fib 3. Pulmonary HTN 4. Severe CAD, s/p CABG Rec: I think her rate is under better control. She has had no symptomatic pauses. I would try and transition to a long acting calcium channel blocker and progress her activity. Her long term prognosis is not good. I think we can hold off on PPM insertion for now. Would consider her wearing an outpatient cardiac monitor.  Leonia ReevesGregg Taylor,M.D.

## 2015-09-22 NOTE — Progress Notes (Signed)
Patient seen and examined. Case d/w residents in detail. I agree with findings and plan as documented in Dr. Eliane DecreePatel's note.  Patient still with frequent episodes of tachycardia with some intermittent bradycardic episodes. Cards f/u appreciated - EP consulted for possible PPM placement given tachybrady syndrome. Given patient's advanced age and frailty and better rate control at current time EP feels she is not a PPM candidate for now. Will continue to monitor on tele on CCB and beta blocker and attempt to transition her to long acting CCB. C/w a/c for afib.

## 2015-09-22 NOTE — Progress Notes (Signed)
Subjective: Patient was bradycardic yesterday afternoon. Medications were adjusted per cardiology. Now off digoxin while continuing Diltiazem and lowered dose of Metoprolol. Still in Afib with rates from 40-160s overnight on telemetry. Patient denies any chest pain, palpitations, dyspnea, nausea, or abdominal pain.  Objective: Vital signs in last 24 hours: Filed Vitals:   09/21/15 2351 09/22/15 0500 09/22/15 0547 09/22/15 0900  BP: 95/53  109/61 100/57  Pulse:   94   Temp:   97.8 F (36.6 C)   TempSrc:   Oral   Resp:   18   Height:      Weight:  86 lb 14.4 oz (39.418 kg)    SpO2:   95%    Weight change:   Intake/Output Summary (Last 24 hours) at 09/22/15 1142 Last data filed at 09/22/15 0853  Gross per 24 hour  Intake    250 ml  Output    200 ml  Net     50 ml   General: resting in bed, no acute distress Cardiac: irregularly, irregular Pulm: bilateral crackles Abd: soft, nontender, nondistended Ext: warm and well perfused, no pedal edema   Assessment/Plan:  Principal Problem:   Atrial fibrillation with RVR (HCC) Active Problems:   2 Vessel CAD - s/p CABG; LIMA-LAD, SVG-RCA   Hypothyroidism   Pleural effusion   AFib with RVR: CHADSVASc score 8. Patient with known history of Afib currently on Eliquis. She does not want to restart Amiodarone due to visual disturbance in the past. Metoprolol was decreased to 25 mg yesterday. Home digoxin has been d/c'ed and diltiazem continued. She has had alternating bradycardia and tachycardia. Cardiology has consulted EP for evaluation for pacemaker placement with concern that she has a tachy-brady syndrome. This would assist with maximizing medical therapy while reducing bradycardic episodes. -Cardiology following and EP consulted, greatly appreciate assistance and recommendations -Metoprolol 25 mg BID -Off Digoxin 0.125 mg daily -Diltiazem 30 mg q6h -Continue Eliquis 5 mg BID -Cardiac monitoring -Monitor vitals   Left pleural  effusion: Repeat 2V CXR on 6/20 showed slight improvement in effusions. Lateral decubitus showed a small layering left pleural effusion. Will hold off on thoracentesis. Do not have strong suspicion of pneumonia now and feel this is likely related to diastolic HF secondary to her Afib RVR. Will continue with diuresis. SCr improved to 0.85 after slight increase yesterday. UOP was 200 mLs yesterday, although patient says she has been urinating frequently and does not think all or her output has been collected for measurement. -d/c'ed antibiotics -Continue Lasix 20 mg po daily -Monitor BMP for renal function -Strict I/Os, daily weights  CAD s/p CABG and MI: Denies chest pain. EKG without obvious acute ischemic changes. Troponins negative x 3. -ASA 81 mg d/c'ed per cards -Continue Crestor 5 mg -Continue Metoprolol as above  Hypothyroidism:TSH 1.314, free T4 elevated at 1.63. On Synthroid 75 mcg daily at home. Decreased dosing in setting of Afib RVR, increased T4, elderly age and low weight. -Decreased Synthroid to 50 mcg po daily  Chronic Back Pain: -Continue home Oxycodone-acetaminophen 5-325 mg q6h prn -Senokot-S & miralax for constipation  Dispo: Disposition is deferred at this time, awaiting improvement of current medical problems.  Anticipated discharge in approximately 1-3 day(s). PT recommending HHPT and rolling walker.  The patient does have a current PCP [name](Yun Sun, MD) and [facility] not [facility] after discharge.  The patient does have transportation limitations that hinder transportation to clinic appointments.    LOS: 4 days  Darreld Mclean, MD 09/22/2015, 11:42 AM

## 2015-09-23 LAB — BASIC METABOLIC PANEL
ANION GAP: 7 (ref 5–15)
BUN: 9 mg/dL (ref 6–20)
CHLORIDE: 92 mmol/L — AB (ref 101–111)
CO2: 38 mmol/L — ABNORMAL HIGH (ref 22–32)
Calcium: 8.3 mg/dL — ABNORMAL LOW (ref 8.9–10.3)
Creatinine, Ser: 0.81 mg/dL (ref 0.44–1.00)
GFR calc Af Amer: >60 mL/min (ref >60)
GLUCOSE: 95 mg/dL (ref 65–99)
POTASSIUM: 3.6 mmol/L (ref 3.5–5.1)
Sodium: 137 mmol/L (ref 135–145)

## 2015-09-23 MED ORDER — FUROSEMIDE 20 MG PO TABS: 20.0000 mg | ORAL_TABLET | ORAL | Status: DC

## 2015-09-23 MED ORDER — DILTIAZEM HCL ER COATED BEADS 120 MG PO CP24
120.0000 mg | ORAL_CAPSULE | Freq: Every day | ORAL | Status: DC
  Administered 2015-09-23: 120 mg via ORAL
  Filled 2015-09-23: qty 1

## 2015-09-23 MED ORDER — BOOST / RESOURCE BREEZE PO LIQD: 1.0000 | Freq: Three times a day (TID) | ORAL | Status: AC

## 2015-09-23 MED ORDER — DILTIAZEM HCL ER COATED BEADS 120 MG PO CP24: 120.0000 mg | ORAL_CAPSULE | Freq: Every day | ORAL | Status: DC

## 2015-09-23 MED ORDER — LEVOTHYROXINE SODIUM 50 MCG PO TABS: 50.0000 ug | ORAL_TABLET | Freq: Every day | ORAL | Status: DC

## 2015-09-23 MED ORDER — BOOST / RESOURCE BREEZE PO LIQD: 1.0000 | Freq: Three times a day (TID) | ORAL | Status: DC

## 2015-09-23 NOTE — Progress Notes (Signed)
Patient seen and examined. Case d/w residents in detail. I agree with findings and plan as documented in Dr. Eliane DecreePatel's note.  Patient's HR now better controlled. She is stable for d/c home today. Cardio f/u appreciated. Transition cardizem to 120 mg long acting and c/w beta blocker. Will c/w low dose lasix at home. Will need repeat CXR in 2-3 weeks to f/u on pleural effusions. Outpatient f/u with PCP in 1 week

## 2015-09-23 NOTE — Clinical Documentation Improvement (Signed)
Internal Medicine Teaching Service  Please document query responses in the progress notes and discharge summary, not on the CDI BPA form in [facility]. Thank you!  Please document the Acuity of the patient's diastolic heart failure treated with IV Lasix this admission:  - Acute  - Acute on Chronic  - Chronic  - Unable to clinically determine  Clinical Information: "Do not have strong suspicion of pneumonia now and feel this is likely related to diastolic HF secondary to her Afib RVR. Will continue with diuresis" is documented in the progress note 09/22/15 by Dr. Terrilee CroakV. Patel   Please exercise your independent, professional judgment when responding. A specific answer is not anticipated or expected.   Thank You, Jerral Ralphathy R Harvel Meskill  RN BSN CCDS [phone] Health Information Management 

## 2015-09-23 NOTE — Care Management Note (Addendum)
Case Management Note Donn PieriniKristi Jilliam Bellmore RN, BSN Unit 2W-Case Manager [phone]  Patient Details  Name: Brenda RinksRobina B Glass MRN: [id number] Date of Birth: 04/17/1940  Subjective/Objective:   Pt admitted with afib- (tachybrady syndrome)                 Action/Plan: PTA pt lived at home- daughter nearby- orders placed for RW and HHPT- spoke with pt and daughter at bedside- per conversation - pt states that she does not want a RW at this time she also declines the HHPT at this time- she reports that she has discussed HH with her PCP in the past but currently just does not feel like she needs it- politely declines any kind of HH referral at this time- stating she will f/u with her PCP if she feels like she needs it following discharge. She also reports that she has a Company secretarytelephonic RN with [employer] that calls her once a month to check in on her and follow for any needs. No CM needs noted for discharge.   Expected Discharge Date:       09/23/15           Expected Discharge Plan:  Home w Home Health Services  In-House Referral:     Discharge planning Services  CM Consult  Post Acute Care Choice:  Home Health, Durable Medical Equipment Choice offered to:  Patient, Adult Children  DME Arranged: Oxygen DME Agency:  [facility]  HH Arranged:  PT, Patient Refused HH Agency:  NA  Status of Service:  Completed, signed off  If discussed at Long Length of Stay Meetings, dates discussed:    Additional Comments:  09/23/15- 1510- Donn PieriniKristi Alekhya Gravlin RN, CM- update- received call from bedside RN- pt now needs home 02- qualifying note has been done and order placed- [facility] tank to room prior to discharge.   Darrold SpanWebster, Emmory Solivan Hall, RN 09/23/2015, 12:25 PM

## 2015-09-23 NOTE — Progress Notes (Signed)
    Subjective:  Denies CP or dyspnea   Objective:  Filed Vitals:   09/22/15 1400 09/22/15 1735 09/22/15 1947 09/23/15 0459  BP: 94/53 105/62 121/60 113/54  Pulse: 70 74 78 76  Temp: 98.3 F (36.8 C)  98.6 F (37 C) 98.4 F (36.9 C)  TempSrc: Oral  Oral Oral  Resp: 18  18 18   Height:      Weight:    85 lb 6.4 oz (38.737 kg)  SpO2: 95%  100% 100%    Intake/Output from previous day:  Intake/Output Summary (Last 24 hours) at 09/23/15 0826 Last data filed at 09/22/15 1300  Gross per 24 hour  Intake    480 ml  Output      0 ml  Net    480 ml    Physical Exam: Physical exam: Well-developed frail in no acute distress.  Skin is warm and dry.  HEENT is normal.  Neck is supple.  Chest with diminished BS throughout, particularly bases Cardiovascular exam is irregular Abdominal exam nontender or distended. No masses palpated. Extremities show no edema. neuro grossly intact    Lab Results: Basic Metabolic Panel:  Recent Labs  40/98/1106/21/17 0330 09/23/15 0233  NA 138 137  K 3.9 3.6  CL 97* 92*  CO2 36* 38*  GLUCOSE 95 95  BUN 12 9  CREATININE 0.85 0.81  CALCIUM 8.1* 8.3*   CBC:  Recent Labs  09/21/15 0249  WBC 6.7  HGB 10.0*  HCT 36.0  MCV 76.9*  PLT 208     Assessment/Plan:  1 Atrial fibrillation-seen by Dr Ladona Ridgelaylor and pacemaker felt not indicated at this point; continue metoprolol 25 BID and change cardizem to 120 mg daily; HR improved; continue apixaban. CHADSvasc 8. 2 Possible pneumonia-per primary service. 3 CAD-continue statin; given need for anticoagulation, no ASA given need for anticoagulation. 4 Pleural effusions-improved; developing contraction alkalosis; change lasix to 20 mg every other day. Pt can be DCed from a cardiac standpoint; TOC appt one week and fu Dr Herbie BaltimoreHarding 8 weeks; check bmet one week; outpt monitor to make sure rate controlled.  Olga MillersBrian Crenshaw 09/23/2015, 8:26 AM

## 2015-09-23 NOTE — Progress Notes (Signed)
Caia B Disano to be D/C'd Home per MD order. Discussed with the patient and all questions fully answered.    VVS, Skin clean, dry and intact without evidence of skin break down, no evidence of skin tears noted.  IV catheter discontinued intact. Site without signs and symptoms of complications. Dressing and pressure applied.  An After Visit Summary was printed and given to the patient.  Patient escorted via WC, and D/C home via private auto.  Kai LevinsJacobs, Tayvien Kane N  09/23/2015 5:11 PM

## 2015-09-23 NOTE — Progress Notes (Signed)
SATURATION QUALIFICATIONS: (This note is used to comply with regulatory documentation for home oxygen)  Patient Saturations on Room Air at Rest = 80-85%   Patient Saturations on Room Air while Ambulating = 72%  Patient Saturations on 2 Liters of oxygen while Ambulating = 92-95%  Please briefly explain why patient needs home oxygen:

## 2015-09-23 NOTE — Progress Notes (Addendum)
   Subjective: Patient feels well this morning. Rates are controlled fairly well. EP not recommending pacemaker at this time. Diltiazem converted to long acting form. Cardiology cleared for discharge from their standpoint.   Objective: Vital signs in last 24 hours: Filed Vitals:   09/22/15 1735 09/22/15 1947 09/23/15 0459 09/23/15 0946  BP: 105/62 121/60 113/54 126/64  Pulse: 74 78 76 71  Temp:  98.6 F (37 C) 98.4 F (36.9 C)   TempSrc:  Oral Oral   Resp:  18 18   Height:      Weight:   85 lb 6.4 oz (38.737 kg)   SpO2:  100% 100%    Weight change: -1 lb 8 oz (-0.68 kg)  Intake/Output Summary (Last 24 hours) at 09/23/15 1137 Last data filed at 09/23/15 0900  Gross per 24 hour  Intake    480 ml  Output      0 ml  Net    480 ml   General: resting in bed, no acute distress Cardiac: irregularly irregular Pulm: bilateral crackles Abd: soft, nontender, nondistended Ext: warm and well perfused, no pedal edema   Assessment/Plan:  Principal Problem:   Atrial fibrillation with RVR (HCC) Active Problems:   2 Vessel CAD - s/p CABG; LIMA-LAD, SVG-RCA   Hypothyroidism   Pleural effusion   AFib with RVR: CHADSVASc score 8. Patient with known history of Afib currently on Eliquis. She does not want to restart Amiodarone due to visual disturbance in the past. Home digoxin has been d/c'ed and diltiazem switched to 24h form. Rate control improved now. Will follow up with PCP in 1 week and Dr. Herbie BaltimoreHarding in 8 weeks. -Appreciate Cardilogy and EP assistance -Metoprolol 25 mg BID -Diltiazem 120 mg po daily -Continue Eliquis 5 mg BID -d/c Digoxin on home meds -f/u with PCP in 1 week, recheck BMP on f/u with outpatient monitor -f/u with Dr. Herbie BaltimoreHarding in 8 weeks   Left pleural effusion: Repeat 2V CXR on 6/20 showed slight improvement in effusions. Lateral decubitus showed a small layering left pleural effusion. Will hold off on thoracentesis. Do not have strong suspicion of pneumonia now and  feel this is likely related to acute on chronic diastolic HF secondary to her Afib RVR. Will continue with diuresis. SCr is stable.  -Continue Lasix 20 mg po daily -Repeat BMP on PCP f/u -Repeat CXR in ~ 2 weeks to monitor for improvement  CAD s/p CABG and MI: Denies chest pain. EKG without obvious acute ischemic changes. Troponins negative x 3. -ASA 81 mg d/c'ed per cards -Continue Crestor 5 mg -Continue Metoprolol as above  Hypothyroidism:TSH 1.314, free T4 elevated at 1.63. On Synthroid 75 mcg daily at home. Decreased dosing in setting of Afib RVR, increased T4, elderly age and low weight. -Decreased Synthroid to 50 mcg po daily  Chronic Back Pain: -Continue home Oxycodone-acetaminophen 5-325 mg q6h prn -Senokot-S & miralax for constipation  Dispo:  Anticipated discharge today. F/u with Dr. Wynelle LinkSun in 1 week and Dr. Herbie BaltimoreHarding in 8 weeks.  The patient does have a current PCP [name](Yun Sun, MD) and [facility] after [facility].  The patient does have transportation limitations that hinder transportation to clinic appointments.    LOS: 5 days   Darreld McleanVishal Patel, MD 09/23/2015, 11:37 AM

## 2015-09-23 NOTE — Progress Notes (Signed)
Initial Nutrition Assessment  DOCUMENTATION CODES:   Non-severe (moderate) malnutrition in context of chronic illness  INTERVENTION:  -Boost Breeze po TID, each supplement provides 250 kcal and 9 grams of protein -RD to continue to monitor  NUTRITION DIAGNOSIS:   Malnutrition related to   as evidenced by moderate depletions of muscle mass, moderate depletion of body fat, percent weight loss.  GOAL:   Patient will meet greater than or equal to 90% of their needs  MONITOR:   PO intake  REASON FOR ASSESSMENT:   Consult Assessment of nutrition requirement/status  ASSESSMENT:   Ms. Merla RichesRobina Busbin is a 75 year old [place] woman with PMH of 2 vessel CAD s/p CABG, Permanent Atrial Fibrillation on Eliquis, ICM (EF 50 % on TTE 04/06/15), Pulm HTN (PA peak pressuer 59 mmHg), PAD, CVA, COPD Gold Stage 3 (FEV1 45%, FEV1/FVC 56% in 2013), HTN, and HLD who presents with palpitations  Spoke with Ms. Magistro, daughter at bedside. They endorse patient having poor appetite for 1-2 weeks PTA. Patient endorses starting thyroid medication that made her nauseous in addition to her afib flare-ups which caused her poor appetite. She also states "I've always been small." Pt doesn't eat much at home, daughter states "she doesn't have a taste for anything."  Pt was unsure of weight loss. Per chart she exhibits a 5#/5.5% severe wt loss in 1 week. Endorses a usual wt of 90# but endorses weight loss from "new thyroid problem" and afib flareups.  Nutrition-Focused physical exam completed. Findings are moderate fat depletion, moderate muscle depletion, and no edema.   Patient had boost breeze during previous admission. Would like to have again. Also provided suggestions for places to buy boost breeze when she leaves the hospital.  Labs and Medications reviewed: Senokot-S   Diet Order:  Diet regular Room service appropriate?: Yes; Fluid consistency:: Thin  Skin:  Reviewed, no issues  Last BM:  6/21  Height:    Ht Readings from Last 1 Encounters:  09/18/15 5' (1.524 m)    Weight:   Wt Readings from Last 1 Encounters:  09/23/15 85 lb 6.4 oz (38.737 kg)    Ideal Body Weight:  45.45 kg  BMI:  Body mass index is 16.68 kg/(m^2).  Estimated Nutritional Needs:   Kcal:  1150-1350 calories  Protein:  40-55 grams  Fluid:  >/= 1L  EDUCATION NEEDS:   No education needs identified at this time  Dionne AnoWilliam M. Takyra Cantrall, MS, RD LDN Inpatient Clinical Dietitian Pager [phone]

## 2015-09-26 ENCOUNTER — Other Ambulatory Visit: Payer: Self-pay | Admitting: Cardiology

## 2015-09-27 NOTE — Telephone Encounter (Signed)
Rx request sent to pharmacy.  

## 2015-09-29 ENCOUNTER — Telehealth: Payer: Self-pay | Admitting: Cardiology

## 2015-09-29 NOTE — Telephone Encounter (Signed)
OK to speak to daughter, per DPR. Pt to see Wynema BirchHao on 7/11.  Call goes to VM directly, left msg for caller.

## 2015-09-29 NOTE — Telephone Encounter (Signed)
New Message  Pt daughter call requesting to speak with RN about pt visit to the ER. Pt daughter states she would like to know if pts need to be seen by provider or would the PA appt be okay. Please call back to discuss

## 2015-09-30 ENCOUNTER — Ambulatory Visit: Payer: Medicare HMO | Admitting: Cardiology

## 2015-09-30 NOTE — Telephone Encounter (Signed)
Received message notifying me pts daughter called back. Returned call, goes to VM, left msg for her to call.

## 2015-10-12 ENCOUNTER — Ambulatory Visit: Payer: Medicare HMO | Admitting: Physician Assistant

## 2015-10-18 ENCOUNTER — Other Ambulatory Visit: Payer: Self-pay | Admitting: Cardiology

## 2015-10-18 ENCOUNTER — Other Ambulatory Visit: Payer: Self-pay | Admitting: Internal Medicine

## 2015-10-20 ENCOUNTER — Other Ambulatory Visit: Payer: Self-pay | Admitting: Internal Medicine

## 2015-10-22 ENCOUNTER — Other Ambulatory Visit: Payer: Self-pay | Admitting: *Deleted

## 2015-10-22 MED ORDER — DILTIAZEM HCL ER COATED BEADS 120 MG PO CP24: 120.0000 mg | ORAL_CAPSULE | Freq: Every day | ORAL | Status: DC

## 2015-10-25 ENCOUNTER — Other Ambulatory Visit: Payer: Self-pay

## 2015-10-25 MED ORDER — LEVOTHYROXINE SODIUM 50 MCG PO TABS: 50.0000 ug | ORAL_TABLET | Freq: Every day | ORAL | 0 refills | Status: DC

## 2015-10-29 ENCOUNTER — Ambulatory Visit: Payer: Medicare HMO | Admitting: Physician Assistant

## 2015-11-14 ENCOUNTER — Emergency Department (HOSPITAL_COMMUNITY)
Admission: EM | Admit: 2015-11-14 | Discharge: 2015-11-14 | Disposition: A | Discharge status: Home / Self Care | Payer: Medicare HMO | Attending: Emergency Medicine | Admitting: Emergency Medicine

## 2015-11-14 ENCOUNTER — Encounter (HOSPITAL_COMMUNITY): Payer: Self-pay | Admitting: Emergency Medicine

## 2015-11-14 DIAGNOSIS — Z8673 Personal history of transient ischemic attack (TIA), and cerebral infarction without residual deficits: Secondary | ICD-10-CM | POA: Insufficient documentation

## 2015-11-14 DIAGNOSIS — Z87891 Personal history of nicotine dependence: Secondary | ICD-10-CM | POA: Diagnosis not present

## 2015-11-14 DIAGNOSIS — I129 Hypertensive chronic kidney disease with stage 1 through stage 4 chronic kidney disease, or unspecified chronic kidney disease: Secondary | ICD-10-CM | POA: Insufficient documentation

## 2015-11-14 DIAGNOSIS — M62830 Muscle spasm of back: Secondary | ICD-10-CM | POA: Diagnosis not present

## 2015-11-14 DIAGNOSIS — I482 Chronic atrial fibrillation, unspecified: Secondary | ICD-10-CM

## 2015-11-14 DIAGNOSIS — Z7901 Long term (current) use of anticoagulants: Secondary | ICD-10-CM | POA: Insufficient documentation

## 2015-11-14 DIAGNOSIS — J449 Chronic obstructive pulmonary disease, unspecified: Secondary | ICD-10-CM | POA: Diagnosis not present

## 2015-11-14 DIAGNOSIS — G8929 Other chronic pain: Secondary | ICD-10-CM

## 2015-11-14 DIAGNOSIS — M549 Dorsalgia, unspecified: Secondary | ICD-10-CM | POA: Diagnosis present

## 2015-11-14 DIAGNOSIS — I2510 Atherosclerotic heart disease of native coronary artery without angina pectoris: Secondary | ICD-10-CM | POA: Diagnosis not present

## 2015-11-14 DIAGNOSIS — E039 Hypothyroidism, unspecified: Secondary | ICD-10-CM | POA: Insufficient documentation

## 2015-11-14 DIAGNOSIS — N183 Chronic kidney disease, stage 3 (moderate): Secondary | ICD-10-CM | POA: Diagnosis not present

## 2015-11-14 DIAGNOSIS — Z951 Presence of aortocoronary bypass graft: Secondary | ICD-10-CM | POA: Diagnosis not present

## 2015-11-14 DIAGNOSIS — Z79899 Other long term (current) drug therapy: Secondary | ICD-10-CM | POA: Insufficient documentation

## 2015-11-14 LAB — CBC WITH DIFFERENTIAL/PLATELET
Basophils Absolute: 0 10*3/uL (ref 0.0–0.1)
Basophils Relative: 0 %
EOS PCT: 0 %
Eosinophils Absolute: 0 10*3/uL (ref 0.0–0.7)
HEMATOCRIT: 38 % (ref 36.0–46.0)
Hemoglobin: 10.3 g/dL — ABNORMAL LOW (ref 12.0–15.0)
LYMPHS PCT: 11 %
Lymphs Abs: 1 10*3/uL (ref 0.7–4.0)
MCH: 20.4 pg — AB (ref 26.0–34.0)
MCHC: 27.1 g/dL — ABNORMAL LOW (ref 30.0–36.0)
MCV: 75.4 fL — AB (ref 78.0–100.0)
MONO ABS: 0.6 10*3/uL (ref 0.1–1.0)
MONOS PCT: 7 %
NEUTROS ABS: 7.3 10*3/uL (ref 1.7–7.7)
Neutrophils Relative %: 82 %
PLATELETS: 342 10*3/uL (ref 150–400)
RBC: 5.04 MIL/uL (ref 3.87–5.11)
RDW: 18.1 % — AB (ref 11.5–15.5)
WBC: 9 10*3/uL (ref 4.0–10.5)

## 2015-11-14 LAB — BASIC METABOLIC PANEL
ANION GAP: 9 (ref 5–15)
BUN: 19 mg/dL (ref 6–20)
CALCIUM: 8.5 mg/dL — AB (ref 8.9–10.3)
CO2: 28 mmol/L (ref 22–32)
Chloride: 98 mmol/L — ABNORMAL LOW (ref 101–111)
Creatinine, Ser: 0.91 mg/dL (ref 0.44–1.00)
GFR calc Af Amer: >60 mL/min (ref >60)
GFR calc non Af Amer: 60 mL/min — ABNORMAL LOW (ref >60)
GLUCOSE: 121 mg/dL — AB (ref 65–99)
POTASSIUM: 3.9 mmol/L (ref 3.5–5.1)
Sodium: 135 mmol/L (ref 135–145)

## 2015-11-14 MED ORDER — LORAZEPAM 1 MG PO TABS
0.5000 mg | ORAL_TABLET | Freq: Once | ORAL | Status: AC
  Administered 2015-11-14: 0.5 mg via ORAL
  Filled 2015-11-14: qty 1

## 2015-11-14 MED ORDER — FENTANYL CITRATE (PF) 100 MCG/2ML IJ SOLN: 25.0000 ug | Freq: Once | INTRAMUSCULAR | Status: DC

## 2015-11-14 MED ORDER — FENTANYL CITRATE (PF) 100 MCG/2ML IJ SOLN
50.0000 ug | Freq: Once | INTRAMUSCULAR | Status: AC
  Administered 2015-11-14: 25 ug via INTRAVENOUS
  Filled 2015-11-14: qty 2

## 2015-11-14 MED ORDER — LORAZEPAM 2 MG/ML IJ SOLN: 0.5000 mg | Freq: Once | INTRAMUSCULAR | Status: DC

## 2015-11-14 MED ORDER — METOPROLOL TARTRATE 5 MG/5ML IV SOLN
2.5000 mg | Freq: Once | INTRAVENOUS | Status: AC
  Administered 2015-11-14: 2.5 mg via INTRAVENOUS
  Filled 2015-11-14: qty 5

## 2015-11-14 MED ORDER — METOPROLOL TARTRATE 5 MG/5ML IV SOLN
2.5000 mg | Freq: Once | INTRAVENOUS | Status: AC
  Administered 2015-11-14: 2.5 mg via INTRAVENOUS

## 2015-11-14 NOTE — ED Provider Notes (Signed)
Medical screening examination/treatment/procedure(s) were conducted as a shared visit with non-physician practitioner(s) and myself.  I personally evaluated the patient during the encounter.   EKG Interpretation None     Patient here with exacerbation her chronic back pain. States that when she gets this, she goes into a rapid ventricular rate response with her A. fib fibrillation. She is in chronic A. fib and does take blood thinners. Denies any chest pain or shortness of breath. EKG is consistent with atrial fibrillation with rapid ventricular rate response. Patient does take a beta blocker as well as a calcium channel blocker. Will medicate with pain meds here as well as Lopressor and monitor  ED ECG REPORT   Date: 11/14/2015  Rate: 141  Rhythm: atrial fibrillation  QRS Axis: normal  Intervals: normal  ST/T Wave abnormalities: nonspecific ST changes  Conduction Disutrbances:none  Narrative Interpretation:   Old EKG Reviewed: none available  I have personally reviewed the EKG tracing and agree with the computerized printout as noted.    Lorre NickAnthony Hallie Ishida, MD 11/14/15 [id number]

## 2015-11-14 NOTE — ED Notes (Signed)
Patient Alert and oriented X4. Stable and ambulatory. Patient verbalized understanding of the discharge instructions.  Patient belongings were taken by the patient.  

## 2015-11-14 NOTE — ED Provider Notes (Signed)
[facility]-EMERGENCY DEPT Provider Note   CSN: [id number] Arrival date & time: 11/14/15  1156  First Provider Contact:  None       History   Chief Complaint Chief Complaint  Patient presents with  . Back Pain    HPI [name] is a 75 y.o. female.  [name] is a 75 y.o. Female who presents to the ED complaining of an exacerbation of her chronic left back pain. She has a history of back muscle spasm there. She usually takes oxycodone which helps with her pain, but it has not helped today. She has chonic A-fib and is on Eliquis. She reports when she has exacerbations of her pain she will go into A-fib RVR. She is on Lopressor and Cardizem and has been taking these medications as prescribed. She reports palpitations with exacerbation of her pain. She denies any chest pain or shortness of breath. She reports her pain comes in waves and can be exacerbated with movement. Patient denies fevers, numbness, tingling, weakness, chest pain, shortness of breath, abdominal pain, nausea, vomiting, loss of bladder control, loss of bowel control, urinary symptoms, or rashes.      Past Medical History:  Diagnosis Date  . Anxiety   . CAD (coronary artery disease), native coronary artery 2009   75% LAD, diffuse 75-90% RCA --> Referred for CABG + MAZE;  Cardiolite 12/2013: No ischemia or Infarction  . Cervical neck pain with evidence of disc disease    with need for surgery -- November 2015  . Chronic low back pain    scoliosis & lordosis  . CKD (chronic kidney disease)   . COPD (chronic obstructive pulmonary disease) (HCC)   . Dyslipidemia, goal LDL below 70    On Crestor, followed by PCP  . Essential hypertension   . H/O ischemic left MCA stroke 11/18/2014   MRI/MRA of head: Multifocal left MCA infarct.. Also possible left ACA territory infarct -- complete occlusion of left MCA distally at M2 level. Marked left ACA attenuation.  . H/O: pneumonia   . Hypothyroidism   . Ischemic  cardiomyopathy 2012   EF ~40-45% by Echo  . Osteoarthritis of back    And neck, hands,spine  . PAD (peripheral artery disease) (HCC) 2007   s/p L Ileac A stent ; most recent Dopplers July 2012: Less than 50% reduction bilaterally. ABI 0.96 on the right 0.88 on left;;;12/27/2011   -ABI right .87 and left ABI .78  ,LEFT CIA and EIA stent normall patency, left CFA,SFA,and popliteal 0-49%; rgt proximal SFA 50-69%,rgt CIA,EIA, and CFA 0-49%  . Persistent atrial fibrillation (HCC) 04/05/2012   Now permanent A. fib s/p MAZE -- recurrence, cardioversion-converted to sinus bradycardia --> Now persistent  . Pulmonary hypertension (HCC)    Estimated PA pressure on Echo January 2017 = 59 mmHg with dilated IVC, severely dilated RA and moderate TR.  . S/P CABG x 2 2009   LIMA-LAD, SVG-RCA, with AV fistula ligation and Maze procedure  . Stroke (HCC)   . TIA (transient ischemic attack) 04/05/2015    Patient Active Problem List   Diagnosis Date Noted  . Pleural effusion 09/22/2015  . CAP (community acquired pneumonia) 09/18/2015  . Atrial fibrillation with RVR (HCC) 09/18/2015  . Chronic atrial fibrillation (HCC) 09/13/2015  . Chronic anticoagulation 09/13/2015  . HLD (hyperlipidemia) 09/13/2015  . Pulmonary hypertension (HCC)   . Cough   . History of stroke   . Acute encephalopathy   . Acute cystitis without hematuria   .  Aspiration pneumonia (HCC)   . TIA (transient ischemic attack) 04/05/2015  . CKD (chronic kidney disease) stage 3, GFR 30-59 ml/min 11/18/2014  . H/O ischemic left MCA stroke 11/18/2014  . Stroke with cerebral ischemia (HCC)   . Weight loss   . Spinal stenosis in cervical region 01/23/2014  . Preoperative cardiovascular examination 01/13/2014  . Ischemic cardiomyopathy   . PAD (peripheral artery disease) (HCC)   . S/P CABG x 2   . Nausea and vomiting 11/29/2011  . 2 Vessel CAD - s/p CABG; LIMA-LAD, SVG-RCA 11/29/2011  . Permanent atrial fibrillation (HCC) 11/29/2011  .  Hyperlipidemia with target LDL less than 70 11/29/2011  . Essential hypertension 11/29/2011  . Hypothyroidism 11/29/2011    Past Surgical History:  Procedure Laterality Date  . ABDOMINAL HYSTERECTOMY    . ANTERIOR CERVICAL DECOMP/DISCECTOMY FUSION N/A 01/23/2014   Procedure: ANTERIOR CERVICAL DECOMPRESSION/DISCECTOMY FUSION CERVICAL FOUR-FIVE,CERVICAL FIVE-SIX,CERVICAL SIX-SEVEN;  Surgeon: Mariam Dollar, MD;  Location: [facility];  Service: Neurosurgery;  Laterality: N/A;  . CARDIAC CATHETERIZATION  07/30/2007   75% LAD ,3 stenoses of 75-90% in  RCA;circumflex from proximal RCA with no lesion seen,normal ramus intermediate branh; normal LV systoilc function  . CARDIOVERSION  04/05/2012   Procedure: CARDIOVERSION;  Surgeon: Thurmon Fair, MD;  Location: [facility];  Service: Cardiovascular;  Laterality: N/A;  . COLONOSCOPY WITH PROPOFOL N/A 11/12/2014   Procedure: COLONOSCOPY WITH PROPOFOL;  Surgeon: Charna Elizabeth, MD;  Location: [facility];  Service: Endoscopy;  Laterality: N/A;  . CORONARY ARTERY BYPASS GRAFT  2009   LIMA-LAD, SVG-RCA (also MAZE & AV fistula ligation)  . ILIAC ARTERY STENT Left 04/06/2005   Ex Iliac - CFA (Smart STENTS -- 7x4 in EIA, 6 x 3 CFA)   . MAZE  2009   along with CABG  . NM CARDIOLITE LTD  12/2013   Non-gated for Afib; No Ischemia or Infarction.  Marland Kitchen NM MYOVIEW LTD  May 2013   No ischemia or Infarct  . TRANSTHORACIC ECHOCARDIOGRAM  Jan 2012   EF ~40-45%, global HK; PAP ~45-50 mmHg  . TRANSTHORACIC ECHOCARDIOGRAM  Jan 2017   EF ~50%. No RWMA, Afib. Paradoxical Septal motion. no DD assessment. Mod LA dilation.  PAP ~59 mmHg & dilated IVC. Mod TR. Severe RA dilation.    OB History    No data available       Home Medications    Prior to Admission medications   Medication Sig Start Date End Date Taking? Authorizing Provider  AMITIZA 24 MCG capsule Take 24 mcg by mouth every evening.  03/17/15   Historical Provider, MD  apixaban (ELIQUIS) 5 MG TABS tablet  Take 1 tablet (5 mg total) by mouth 2 (two) times daily. 11/19/14   Joseph Art, DO  Cholecalciferol (VITAMIN D3) 2000 units TABS Take 1 tablet by mouth daily.    Historical Provider, MD  diltiazem (CARDIZEM CD) 120 MG 24 hr capsule Take 1 capsule (120 mg total) by mouth daily. NEED OV. 10/22/15   Marykay Lex, MD  feeding supplement (BOOST / RESOURCE BREEZE) LIQD Take 1 Container by mouth 3 (three) times daily between meals. 09/23/15   Darreld Mclean, MD  furosemide (LASIX) 20 MG tablet Take 1 tablet (20 mg total) by mouth every other day. 09/24/15   Darreld Mclean, MD  levothyroxine (SYNTHROID, LEVOTHROID) 50 MCG tablet Take 1 tablet (50 mcg total) by mouth daily before breakfast. 10/25/15   Marykay Lex, MD  metoprolol (LOPRESSOR) 50 MG tablet TAKE 0.5  TABLETS (25 MG TOTAL) BY MOUTH 2 (TWO) TIMES DAILY. 09/27/15   Marykay Lex, MD  Multiple Vitamins-Minerals (PRESERVISION AREDS 2) CAPS Take 1 capsule by mouth 2 (two) times daily.    Historical Provider, MD  oxybutynin (DITROPAN) 5 MG tablet Take 5 mg by mouth 2 (two) times daily as needed for bladder spasms.    Historical Provider, MD  oxyCODONE-acetaminophen (PERCOCET/ROXICET) 5-325 MG tablet  08/19/15   Historical Provider, MD  rosuvastatin (CRESTOR) 5 MG tablet TAKE 1 TABLET BY MOUTH IN THE EVENING 10/19/15   Marykay Lex, MD  senna-docusate (SENOKOT-S) 8.6-50 MG tablet Take 1 tablet by mouth at bedtime as needed for mild constipation. 04/09/15   Richarda Overlie, MD    Family History Family History  Problem Relation Age of Onset  . Heart attack Mother     Late 64s  . Stroke Mother   . Stroke Father   . Heart attack Father     36s  . Heart disease Brother 60    Cardiomegaly  . Stroke Sister   . Heart attack Son 61    Social History Social History  Substance Use Topics  . Smoking status: Former Smoker    Packs/day: 0.50    Types: Cigarettes    Quit date: 01/07/1998  . Smokeless tobacco: Never Used  . Alcohol use No      Allergies   Review of patient's allergies indicates no known allergies.   Review of Systems Review of Systems  Constitutional: Negative for chills and fever.  HENT: Negative for sore throat.   Eyes: Negative for visual disturbance.  Respiratory: Negative for cough and shortness of breath.   Cardiovascular: Positive for palpitations. Negative for chest pain and leg swelling.  Gastrointestinal: Negative for abdominal pain, diarrhea, nausea and vomiting.  Genitourinary: Negative for difficulty urinating, dysuria, frequency and hematuria.  Musculoskeletal: Positive for back pain and myalgias. Negative for neck pain.  Skin: Negative for rash.  Neurological: Negative for weakness, numbness and headaches.     Physical Exam Updated Vital Signs BP 96/72   Pulse 104   Temp 97.4 F (36.3 C) (Oral)   Resp 14   SpO2 94%   Physical Exam  Constitutional: She is oriented to person, place, and time. She appears well-developed and well-nourished. No distress.  Nontoxic appearing.  HENT:  Head: Normocephalic and atraumatic.  Mouth/Throat: Oropharynx is clear and moist.  Eyes: Conjunctivae are normal. Pupils are equal, round, and reactive to light. Right eye exhibits no discharge. Left eye exhibits no discharge.  Neck: Neck supple. No JVD present. No tracheal deviation present.  Cardiovascular: Normal heart sounds and intact distal pulses.  Exam reveals no gallop and no friction rub.   No murmur heard. Irregularly irregular rhythm. Heart rate between 120 and 155 on the monitor. Patient in A. fib RVR on the monitor. Good capillary refill. Good bilateral radial pulses.  Pulmonary/Chest: Effort normal and breath sounds normal. No stridor. No respiratory distress. She has no wheezes. She has no rales.  Lungs clear to auscultation bilaterally.  Abdominal: Soft. There is no tenderness.  Musculoskeletal: Normal range of motion. She exhibits tenderness. She exhibits no edema or deformity.   Tenderness to her left upper back musculature. Back muscle feels to be in spasm. No midline neck or back tenderness. No back erythema, deformity, ecchymosis or warmth. Good strength to her bilateral upper and lower extremities.  Lymphadenopathy:    She has no cervical adenopathy.  Neurological: She is alert and oriented  to person, place, and time. Coordination normal.  Sensation is intact in her bilateral upper and lower extremities.  Skin: Skin is warm and dry. Capillary refill takes less than 2 seconds. No rash noted. She is not diaphoretic. No erythema. No pallor.  Psychiatric: She has a normal mood and affect. Her behavior is normal.  Nursing note and vitals reviewed.    ED Treatments / Results  Labs (all labs ordered are listed, but only abnormal results are displayed) Labs Reviewed  BASIC METABOLIC PANEL - Abnormal; Notable for the following:       Result Value   Chloride 98 (*)    Glucose, Bld 121 (*)    Calcium 8.5 (*)    GFR calc non Af Amer 60 (*)    All other components within normal limits  CBC WITH DIFFERENTIAL/PLATELET - Abnormal; Notable for the following:    Hemoglobin 10.3 (*)    MCV 75.4 (*)    MCH 20.4 (*)    MCHC 27.1 (*)    RDW 18.1 (*)    All other components within normal limits    EKG  EKG Interpretation None       Radiology No results found.  Procedures Procedures (including critical care time)  Medications Ordered in ED Medications  LORazepam (ATIVAN) injection 0.5 mg (not administered)  fentaNYL (SUBLIMAZE) injection 25 mcg (not administered)  metoprolol (LOPRESSOR) injection 2.5 mg (2.5 mg Intravenous Given 11/14/15 1519)  LORazepam (ATIVAN) tablet 0.5 mg (0.5 mg Oral Given 11/14/15 1417)  fentaNYL (SUBLIMAZE) injection 50 mcg (25 mcg Intravenous Given 11/14/15 1507)  metoprolol (LOPRESSOR) injection 2.5 mg (2.5 mg Intravenous Given 11/14/15 1546)     Initial Impression / Assessment and Plan / ED Course  I have reviewed the triage  vital signs and the nursing notes.  Pertinent labs & imaging results that were available during my care of the patient were reviewed by me and considered in my medical decision making (see chart for details).  Clinical Course  Comment By Time  Patient reports she is starting to feel better. She still has a HR of 140. BP stable. Will give another 25 mcg of fentanyl, 0.5 ativan, and 2.5 mg of lopressor.  Everlene Farrier, PA-C 08/13 1530   This is a 75 y.o. Female who presents to the ED complaining of an exacerbation of her chronic left back pain. She has a history of back muscle spasm there. She usually takes oxycodone which helps with her pain, but it has not helped today. She has chonic A-fib and is on Eliquis. She reports when she has exacerbations of her pain she will go into A-fib RVR. She is on Lopressor and Cardizem and has been taking these medications as prescribed. She reports palpitations with exacerbation of her pain. She denies any chest pain or shortness of breath. She reports her pain comes in waves and can be exacerbated with movement.  On exam the patient is afebrile nontoxic appearing. She has tenderness to her left upper back musculature. It appears to be in spasm. No midline neck or back tenderness. No focal neurological deficits. She is in A. fib RVR on the monitor with heart rate between 120 and 150. She is hemodynamically stable. Will start with fentanyl, Ativan and Lopressor. After a round of pain medication she reports her pain is completely resolved. There are 2 doses of Lopressor patient was heart rate improved and patient's A. fib is rate controlled. She reports her palpitations have resolved. She reports feeling ready  for discharge. We'll discharge with close follow-up by her primary care doctor. I discussed return precautions. I advised the patient to follow-up with their primary care provider this week. I advised the patient to return to the emergency department with new or  worsening symptoms or new concerns. The patient verbalized understanding and agreement with plan.    This patient was discussed with and evaluated by Dr. Freida Busman who agrees with assessment and plan.   Final Clinical Impressions(s) / ED Diagnoses   Final diagnoses:  Chronic back pain  Muscle spasm of back  Chronic a-fib [facility])    New Prescriptions New Prescriptions   No medications on file     Everlene Farrier, PA-C 11/14/15 1631

## 2015-11-14 NOTE — ED Notes (Addendum)
Pt has large muscle spasm near left shoulder blade. Has been seen for same recently-- pain meds not working. Has been using heating pad.

## 2015-11-14 NOTE — ED Triage Notes (Signed)
Pt sts left mid back pain that is chronic in nature; pt sts seen here for same before and usually has to have pain pills but this time did not help

## 2015-11-16 ENCOUNTER — Inpatient Hospital Stay (HOSPITAL_COMMUNITY)
Admission: EM | Admit: 2015-11-16 | Discharge: 2015-11-24 | DRG: 242 | Disposition: A | Discharge status: Home / Self Care | Payer: Medicare HMO | Attending: Internal Medicine | Admitting: Internal Medicine

## 2015-11-16 ENCOUNTER — Emergency Department (HOSPITAL_COMMUNITY): Payer: Medicare HMO

## 2015-11-16 ENCOUNTER — Encounter (HOSPITAL_COMMUNITY): Payer: Self-pay | Admitting: Emergency Medicine

## 2015-11-16 DIAGNOSIS — I481 Persistent atrial fibrillation: Secondary | ICD-10-CM | POA: Diagnosis not present

## 2015-11-16 DIAGNOSIS — Z951 Presence of aortocoronary bypass graft: Secondary | ICD-10-CM

## 2015-11-16 DIAGNOSIS — I10 Essential (primary) hypertension: Secondary | ICD-10-CM | POA: Diagnosis present

## 2015-11-16 DIAGNOSIS — E440 Moderate protein-calorie malnutrition: Secondary | ICD-10-CM | POA: Diagnosis present

## 2015-11-16 DIAGNOSIS — E876 Hypokalemia: Secondary | ICD-10-CM | POA: Diagnosis present

## 2015-11-16 DIAGNOSIS — I272 Other secondary pulmonary hypertension: Secondary | ICD-10-CM | POA: Diagnosis present

## 2015-11-16 DIAGNOSIS — F419 Anxiety disorder, unspecified: Secondary | ICD-10-CM | POA: Diagnosis present

## 2015-11-16 DIAGNOSIS — T501X5A Adverse effect of loop [high-ceiling] diuretics, initial encounter: Secondary | ICD-10-CM | POA: Diagnosis not present

## 2015-11-16 DIAGNOSIS — I2510 Atherosclerotic heart disease of native coronary artery without angina pectoris: Secondary | ICD-10-CM | POA: Diagnosis present

## 2015-11-16 DIAGNOSIS — J9610 Chronic respiratory failure, unspecified whether with hypoxia or hypercapnia: Secondary | ICD-10-CM | POA: Diagnosis present

## 2015-11-16 DIAGNOSIS — E43 Unspecified severe protein-calorie malnutrition: Secondary | ICD-10-CM | POA: Insufficient documentation

## 2015-11-16 DIAGNOSIS — Z981 Arthrodesis status: Secondary | ICD-10-CM

## 2015-11-16 DIAGNOSIS — Z66 Do not resuscitate: Secondary | ICD-10-CM | POA: Diagnosis present

## 2015-11-16 DIAGNOSIS — E785 Hyperlipidemia, unspecified: Secondary | ICD-10-CM | POA: Diagnosis present

## 2015-11-16 DIAGNOSIS — Z681 Body mass index (BMI) 19 or less, adult: Secondary | ICD-10-CM

## 2015-11-16 DIAGNOSIS — Z79899 Other long term (current) drug therapy: Secondary | ICD-10-CM

## 2015-11-16 DIAGNOSIS — I130 Hypertensive heart and chronic kidney disease with heart failure and stage 1 through stage 4 chronic kidney disease, or unspecified chronic kidney disease: Secondary | ICD-10-CM | POA: Diagnosis present

## 2015-11-16 DIAGNOSIS — Z9981 Dependence on supplemental oxygen: Secondary | ICD-10-CM

## 2015-11-16 DIAGNOSIS — I4891 Unspecified atrial fibrillation: Secondary | ICD-10-CM | POA: Diagnosis present

## 2015-11-16 DIAGNOSIS — I5043 Acute on chronic combined systolic (congestive) and diastolic (congestive) heart failure: Secondary | ICD-10-CM | POA: Diagnosis present

## 2015-11-16 DIAGNOSIS — I472 Ventricular tachycardia: Secondary | ICD-10-CM | POA: Diagnosis not present

## 2015-11-16 DIAGNOSIS — E039 Hypothyroidism, unspecified: Secondary | ICD-10-CM | POA: Diagnosis present

## 2015-11-16 DIAGNOSIS — N189 Chronic kidney disease, unspecified: Secondary | ICD-10-CM | POA: Diagnosis present

## 2015-11-16 DIAGNOSIS — Z950 Presence of cardiac pacemaker: Secondary | ICD-10-CM

## 2015-11-16 DIAGNOSIS — D509 Iron deficiency anemia, unspecified: Secondary | ICD-10-CM | POA: Diagnosis present

## 2015-11-16 DIAGNOSIS — Z8673 Personal history of transient ischemic attack (TIA), and cerebral infarction without residual deficits: Secondary | ICD-10-CM

## 2015-11-16 DIAGNOSIS — I739 Peripheral vascular disease, unspecified: Secondary | ICD-10-CM | POA: Diagnosis present

## 2015-11-16 DIAGNOSIS — I509 Heart failure, unspecified: Secondary | ICD-10-CM

## 2015-11-16 DIAGNOSIS — I482 Chronic atrial fibrillation, unspecified: Secondary | ICD-10-CM | POA: Diagnosis present

## 2015-11-16 DIAGNOSIS — I495 Sick sinus syndrome: Secondary | ICD-10-CM

## 2015-11-16 DIAGNOSIS — R002 Palpitations: Secondary | ICD-10-CM | POA: Diagnosis not present

## 2015-11-16 DIAGNOSIS — R627 Adult failure to thrive: Secondary | ICD-10-CM | POA: Diagnosis present

## 2015-11-16 DIAGNOSIS — Z87891 Personal history of nicotine dependence: Secondary | ICD-10-CM

## 2015-11-16 DIAGNOSIS — I255 Ischemic cardiomyopathy: Secondary | ICD-10-CM | POA: Diagnosis present

## 2015-11-16 DIAGNOSIS — E875 Hyperkalemia: Secondary | ICD-10-CM | POA: Diagnosis not present

## 2015-11-16 DIAGNOSIS — I5032 Chronic diastolic (congestive) heart failure: Secondary | ICD-10-CM | POA: Diagnosis present

## 2015-11-16 DIAGNOSIS — J449 Chronic obstructive pulmonary disease, unspecified: Secondary | ICD-10-CM | POA: Diagnosis present

## 2015-11-16 DIAGNOSIS — Z7901 Long term (current) use of anticoagulants: Secondary | ICD-10-CM

## 2015-11-16 LAB — BASIC METABOLIC PANEL
Anion gap: 8 (ref 5–15)
BUN: 13 mg/dL (ref 6–20)
CHLORIDE: 99 mmol/L — AB (ref 101–111)
CO2: 28 mmol/L (ref 22–32)
Calcium: 8.7 mg/dL — ABNORMAL LOW (ref 8.9–10.3)
Creatinine, Ser: 0.98 mg/dL (ref 0.44–1.00)
GFR calc Af Amer: >60 mL/min (ref >60)
GFR calc non Af Amer: 55 mL/min — ABNORMAL LOW (ref >60)
Glucose, Bld: 245 mg/dL — ABNORMAL HIGH (ref 65–99)
POTASSIUM: 3.4 mmol/L — AB (ref 3.5–5.1)
SODIUM: 135 mmol/L (ref 135–145)

## 2015-11-16 LAB — CBC
HEMATOCRIT: 37.9 % (ref 36.0–46.0)
Hemoglobin: 10.3 g/dL — ABNORMAL LOW (ref 12.0–15.0)
MCH: 20.6 pg — ABNORMAL LOW (ref 26.0–34.0)
MCHC: 27.2 g/dL — ABNORMAL LOW (ref 30.0–36.0)
MCV: 75.8 fL — AB (ref 78.0–100.0)
Platelets: 329 10*3/uL (ref 150–400)
RBC: 5 MIL/uL (ref 3.87–5.11)
RDW: 18.3 % — AB (ref 11.5–15.5)
WBC: 7.9 10*3/uL (ref 4.0–10.5)

## 2015-11-16 MED ORDER — METOPROLOL TARTRATE 5 MG/5ML IV SOLN
5.0000 mg | Freq: Once | INTRAVENOUS | Status: AC
  Administered 2015-11-16: 5 mg via INTRAVENOUS
  Filled 2015-11-16: qty 5

## 2015-11-16 MED ORDER — DILTIAZEM LOAD VIA INFUSION: 10.0000 mg | Freq: Once | INTRAVENOUS | Status: DC

## 2015-11-16 MED ORDER — DILTIAZEM HCL-DEXTROSE 100-5 MG/100ML-% IV SOLN (PREMIX): 5.0000 mg/h | INTRAVENOUS | Status: DC

## 2015-11-16 NOTE — ED Provider Notes (Signed)
[facility]-EMER[facility]ENCY DEPT Provider Note   CSN: [id number] Arrival date & time: 11/16/15  2049     History   Chief Complaint Chief Complaint  Patient presents with  . Palpitations  . Shortness of Breath    HPI [name] is a 75 y.o. female.  [name] is a 75 y.o. Female who presents to the ED complaining of palpitations today. Patient has a history of atrial fibrillation and is anticoagulated on Eliquis. She take Lopressor and Cardizem for rate control. She has been compliant with her medications. She reports this morning she began having palpitations and increasing SOB. Patient uses oxygen at home but uses it only intermittently. Family member at bedside reports that she is had increasing shortness of breath and can only walk a few steps without feeling very short of breath. She's been compliant with her medications. She denies fevers, chest pain, lightheadedness, dizziness, syncope, abdominal pain, nausea, vomiting, numbness, tingling or weakness.      Past Medical History:  Diagnosis Date  . Anxiety   . CAD (coronary artery disease), native coronary artery 2009   75% LAD, diffuse 75-90% RCA --> Referred for CABG + MAZE;  Cardiolite 12/2013: No ischemia or Infarction  . Cervical neck pain with evidence of disc disease    with need for surgery -- November 2015  . Chronic low back pain    scoliosis & lordosis  . CKD (chronic kidney disease)   . COPD (chronic obstructive pulmonary disease) (HCC)   . Dyslipidemia, goal LDL below 70    On Crestor, followed by PCP  . Essential hypertension   . H/O ischemic left MCA stroke 11/18/2014   MRI/MRA of head: Multifocal left MCA infarct.. Also possible left ACA territory infarct -- complete occlusion of left MCA distally at M2 level. Marked left ACA attenuation.  . H/O: pneumonia   . Hypothyroidism   . Ischemic cardiomyopathy 2012   EF ~40-45% by Echo  . Osteoarthritis of back    And neck, hands,spine  . PAD (peripheral artery  disease) (HCC) 2007   s/p L Ileac A stent ; most recent Dopplers July 2012: Less than 50% reduction bilaterally. ABI 0.96 on the right 0.88 on left;;;12/27/2011   -ABI right .87 and left ABI .78  ,LEFT CIA and EIA stent normall patency, left CFA,SFA,and popliteal 0-49%; rgt proximal SFA 50-69%,rgt CIA,EIA, and CFA 0-49%  . Persistent atrial fibrillation (HCC) 04/05/2012   Now permanent A. fib s/p MAZE -- recurrence, cardioversion-converted to sinus bradycardia --> Now persistent  . Pulmonary hypertension (HCC)    Estimated PA pressure on Echo January 2017 = 59 mmHg with dilated IVC, severely dilated RA and moderate TR.  . S/P CABG x 2 2009   LIMA-LAD, SVG-RCA, with AV fistula ligation and Maze procedure  . Stroke (HCC)   . TIA (transient ischemic attack) 04/05/2015    Patient Active Problem List   Diagnosis Date Noted  . Pleural effusion 09/22/2015  . CAP (community acquired pneumonia) 09/18/2015  . Atrial fibrillation with RVR (HCC) 09/18/2015  . Chronic atrial fibrillation (HCC) 09/13/2015  . Chronic anticoagulation 09/13/2015  . HLD (hyperlipidemia) 09/13/2015  . Pulmonary hypertension (HCC)   . Cough   . History of stroke   . Acute encephalopathy   . Acute cystitis without hematuria   . Aspiration pneumonia (HCC)   . TIA (transient ischemic attack) 04/05/2015  . CKD (chronic kidney disease) stage 3, GFR 30-59 ml/min 11/18/2014  . H/O ischemic left MCA stroke 11/18/2014  .  Stroke with cerebral ischemia (HCC)   . Weight loss   . Spinal stenosis in cervical region 01/23/2014  . Preoperative cardiovascular examination 01/13/2014  . Ischemic cardiomyopathy   . PAD (peripheral artery disease) (HCC)   . S/P CABG x 2   . Nausea and vomiting 11/29/2011  . 2 Vessel CAD - s/p CABG; LIMA-LAD, SVG-RCA 11/29/2011  . Permanent atrial fibrillation (HCC) 11/29/2011  . Hyperlipidemia with target LDL less than 70 11/29/2011  . Essential hypertension 11/29/2011  . Hypothyroidism 11/29/2011     Past Surgical History:  Procedure Laterality Date  . ABDOMINAL HYSTERECTOMY    . ANTERIOR CERVICAL DECOMP/DISCECTOMY FUSION N/A 01/23/2014   Procedure: ANTERIOR CERVICAL DECOMPRESSION/DISCECTOMY FUSION CERVICAL FOUR-FIVE,CERVICAL FIVE-SIX,CERVICAL SIX-SEVEN;  Surgeon: Mariam Dollar, MD;  Location: [facility];  Service: Neurosurgery;  Laterality: N/A;  . CARDIAC CATHETERIZATION  07/30/2007   75% LAD ,3 stenoses of 75-90% in  RCA;circumflex from proximal RCA with no lesion seen,normal ramus intermediate branh; normal LV systoilc function  . CARDIOVERSION  04/05/2012   Procedure: CARDIOVERSION;  Surgeon: Thurmon Fair, MD;  Location: [facility];  Service: Cardiovascular;  Laterality: N/A;  . COLONOSCOPY WITH PROPOFOL N/A 11/12/2014   Procedure: COLONOSCOPY WITH PROPOFOL;  Surgeon: Charna Elizabeth, MD;  Location: [facility];  Service: Endoscopy;  Laterality: N/A;  . CORONARY ARTERY BYPASS GRAFT  2009   LIMA-LAD, SVG-RCA (also MAZE & AV fistula ligation)  . ILIAC ARTERY STENT Left 04/06/2005   Ex Iliac - CFA (Smart STENTS -- 7x4 in EIA, 6 x 3 CFA)   . MAZE  2009   along with CABG  . NM CARDIOLITE LTD  12/2013   Non-gated for Afib; No Ischemia or Infarction.  Marland Kitchen NM MYOVIEW LTD  May 2013   No ischemia or Infarct  . TRANSTHORACIC ECHOCARDIOGRAM  Jan 2012   EF ~40-45%, global HK; PAP ~45-50 mmHg  . TRANSTHORACIC ECHOCARDIOGRAM  Jan 2017   EF ~50%. No RWMA, Afib. Paradoxical Septal motion. no DD assessment. Mod LA dilation.  PAP ~59 mmHg & dilated IVC. Mod TR. Severe RA dilation.    OB History    No data available       Home Medications    Prior to Admission medications   Medication Sig Start Date End Date Taking? Authorizing Provider  AMITIZA 24 MCG capsule Take 24 mcg by mouth every evening.  03/17/15  Yes Historical Provider, MD  apixaban (ELIQUIS) 5 MG TABS tablet Take 1 tablet (5 mg total) by mouth 2 (two) times daily. 11/19/14  Yes Joseph Art, DO  Cholecalciferol (VITAMIN D3)  2000 units TABS Take 1 tablet by mouth daily.   Yes Historical Provider, MD  diltiazem (CARDIZEM CD) 120 MG 24 hr capsule Take 1 capsule (120 mg total) by mouth daily. NEED OV. 10/22/15  Yes Marykay Lex, MD  furosemide (LASIX) 20 MG tablet Take 1 tablet (20 mg total) by mouth every other day. 09/24/15  Yes Darreld Mclean, MD  levothyroxine (SYNTHROID, LEVOTHROID) 50 MCG tablet Take 1 tablet (50 mcg total) by mouth daily before breakfast. 10/25/15  Yes Marykay Lex, MD  metoprolol (LOPRESSOR) 50 MG tablet TAKE 0.5 TABLETS (25 MG TOTAL) BY MOUTH 2 (TWO) TIMES DAILY. 09/27/15  Yes Marykay Lex, MD  Multiple Vitamins-Minerals (PRESERVISION AREDS 2) CAPS Take 1 capsule by mouth 2 (two) times daily.   Yes Historical Provider, MD  oxyCODONE-acetaminophen (PERCOCET/ROXICET) 5-325 MG tablet Take 1 tablet by mouth every 6 (six) hours as needed for moderate  pain.  08/19/15  Yes Historical Provider, MD  rosuvastatin (CRESTOR) 5 MG tablet TAKE 1 TABLET BY MOUTH IN THE EVENING 10/19/15  Yes Marykay Lex, MD  feeding supplement (BOOST / RESOURCE BREEZE) LIQD Take 1 Container by mouth 3 (three) times daily between meals. Patient not taking: Reported on 11/17/2015 09/23/15   Darreld Mclean, MD  senna-docusate (SENOKOT-S) 8.6-50 MG tablet Take 1 tablet by mouth at bedtime as needed for mild constipation. Patient not taking: Reported on 11/17/2015 04/09/15   Richarda Overlie, MD    Family History Family History  Problem Relation Age of Onset  . Heart attack Mother     Late 84s  . Stroke Mother   . Stroke Father   . Heart attack Father     53s  . Heart disease Brother 60    Cardiomegaly  . Stroke Sister   . Heart attack Son 41    Social History Social History  Substance Use Topics  . Smoking status: Former Smoker    Packs/day: 0.50    Types: Cigarettes    Quit date: 01/07/1998  . Smokeless tobacco: Never Used  . Alcohol use No     Allergies   Review of patient's allergies indicates no known  allergies.   Review of Systems Review of Systems  Constitutional: Negative for chills and fever.  HENT: Negative for congestion and sore throat.   Eyes: Negative for visual disturbance.  Respiratory: Positive for shortness of breath. Negative for cough and wheezing.   Cardiovascular: Positive for palpitations. Negative for chest pain and leg swelling.  Gastrointestinal: Negative for abdominal pain, nausea and vomiting.  Genitourinary: Negative for dysuria.  Musculoskeletal: Negative for back pain and neck pain.  Skin: Negative for rash.  Neurological: Negative for syncope, weakness, light-headedness, numbness and headaches.     Physical Exam Updated Vital Signs BP 124/92   Pulse (!) 151   Temp 97.6 F (36.4 C) (Oral)   Resp (!) 42   SpO2 99%   Physical Exam  Constitutional: She is oriented to person, place, and time. She appears well-developed and well-nourished. No distress.  Nontoxic appearing.  HENT:  Head: Normocephalic and atraumatic.  Mouth/Throat: Oropharynx is clear and moist.  Eyes: Conjunctivae are normal. Pupils are equal, round, and reactive to light. Right eye exhibits no discharge. Left eye exhibits no discharge.  Neck: Neck supple. No JVD present. No tracheal deviation present.  Cardiovascular: Normal heart sounds and intact distal pulses.  Exam reveals no gallop and no friction rub.   No murmur heard. Irregular regular rhythm. Heart rate between 120 and 140 on monitor. A. fib on monitor. Bilateral radial pulses are intact. Good capillary refill to her bilateral distal fingertips.  Pulmonary/Chest: Effort normal and breath sounds normal. No stridor. No respiratory distress. She has no wheezes. She has no rales.  Abdominal: Soft. There is no tenderness.  Musculoskeletal: She exhibits no edema or tenderness.  No lower extremity edema or tenderness.  Lymphadenopathy:    She has no cervical adenopathy.  Neurological: She is alert and oriented to person, place,  and time. Coordination normal.  Patient is alert and oriented.  Skin: Skin is warm and dry. Capillary refill takes less than 2 seconds. No rash noted. She is not diaphoretic. No erythema. No pallor.  Psychiatric: She has a normal mood and affect. Her behavior is normal.  Nursing note and vitals reviewed.    ED Treatments / Results  Labs (all labs ordered are listed, but only abnormal results  are displayed) Labs Reviewed  BASIC METABOLIC PANEL - Abnormal; Notable for the following:       Result Value   Potassium 3.4 (*)    Chloride 99 (*)    Glucose, Bld 245 (*)    Calcium 8.7 (*)    GFR calc non Af Amer 55 (*)    All other components within normal limits  CBC - Abnormal; Notable for the following:    Hemoglobin 10.3 (*)    MCV 75.8 (*)    MCH 20.6 (*)    MCHC 27.2 (*)    RDW 18.3 (*)    All other components within normal limits  BRAIN NATRIURETIC PEPTIDE - Abnormal; Notable for the following:    B Natriuretic Peptide 460.0 (*)    All other components within normal limits  PROTIME-INR - Abnormal; Notable for the following:    Prothrombin Time 32.1 (*)    All other components within normal limits  I-STAT TROPOININ, ED    EKG  EKG Interpretation  Date/Time:  Tuesday November 16 2015 20:54:02 EDT Ventricular Rate:  142 PR Interval:    QRS Duration: 82 QT Interval:  322 QTC Calculation: 495 R Axis:   108 Text Interpretation:  Atrial fibrillation with rapid ventricular response Rightward axis Possible Anterior infarct , age undetermined Abnormal ECG Confirmed by Wilkie Aye  MD, COURTNEY ([id number]) on 11/16/2015 11:37:33 PM       Radiology Dg Chest 2 View  Result Date: 11/16/2015 CLINICAL DATA:  Atrial fibrillation and short of breath EXAM: CHEST  2 VIEW COMPARISON:  09/21/2015 FINDINGS: Postop CABG. Heart size mildly enlarged. Pulmonary vascular congestion slightly increased. Bilateral pleural effusions left greater than right similar to the prior study. Bibasilar atelectasis  unchanged. Negative for pulmonary edema. IMPRESSION: Pulmonary vascular congestion with bilateral pleural effusions consistent with fluid overload. Bibasilar atelectasis left greater than right unchanged from the prior study. Electronically Signed   By: Marlan Palau M.D.   On: 11/16/2015 21:51    Procedures Procedures (including critical care time)  Medications Ordered in ED Medications  metoprolol (LOPRESSOR) injection 5 mg (5 mg Intravenous Given 11/16/15 2349)  furosemide (LASIX) injection 40 mg (40 mg Intravenous Given 11/17/15 0048)     Initial Impression / Assessment and Plan / ED Course  I have reviewed the triage vital signs and the nursing notes.  Pertinent labs & imaging results that were available during my care of the patient were reviewed by me and considered in my medical decision making (see chart for details).  Clinical Course  Comment By Time  Patient not yet in room  Everlene Farrier, PA-C 08/15 2247    Patient presented to the emergency department in A. fib RVR and worsening shortness of breath. Chest x-ray shows fluid overload. BNP is elevated at 460. Troponin is not elevated. Patient is currently on Cardizem and Lopressor at home. Patient given 5 of Lopressor and 40 of IV Lasix. Patient tolerated the Lopressor well and her rate improved from 14 to 100-110. I suspect the patient is having worsening heart failure due to her paroxysmal atrial fibrillation RVR causing her to be fluid overloaded. Will admit to medicine to titrate her oral medicines and diurese.  I consulted with Dr. Antionette Char who accepted the patient for admission. He requested temporary admission orders for telemetry.  This patient was discussed with and evaluated by Dr. Wilkie Aye who agrees with assessment and plan.  Final Clinical Impressions(s) / ED Diagnoses   Final diagnoses:  Atrial fibrillation with RVR (  HCC)  Congestive heart failure, unspecified congestive heart failure chronicity, unspecified  congestive heart failure type [facility](HCC)    New Prescriptions New Prescriptions   No medications on file     Everlene FarrierWilliam Addie Cederberg, PA-C 11/17/15 0100    Shon Batonourtney F Horton, MD 11/17/15 [phone]

## 2015-11-16 NOTE — ED Notes (Signed)
Dr. Horton at bedside at this time.  

## 2015-11-16 NOTE — ED Triage Notes (Signed)
Pt presents to ED after she states her heart has been racing all day.  Pt sts she woke up to it.  Pt denies any CP, but c/o SOB.  Pt c/o increased fatigue.

## 2015-11-17 ENCOUNTER — Encounter (HOSPITAL_COMMUNITY): Payer: Self-pay | Admitting: Family Medicine

## 2015-11-17 DIAGNOSIS — E876 Hypokalemia: Secondary | ICD-10-CM | POA: Diagnosis present

## 2015-11-17 DIAGNOSIS — E038 Other specified hypothyroidism: Secondary | ICD-10-CM

## 2015-11-17 DIAGNOSIS — I4891 Unspecified atrial fibrillation: Secondary | ICD-10-CM

## 2015-11-17 DIAGNOSIS — I10 Essential (primary) hypertension: Secondary | ICD-10-CM | POA: Diagnosis not present

## 2015-11-17 DIAGNOSIS — D509 Iron deficiency anemia, unspecified: Secondary | ICD-10-CM | POA: Diagnosis present

## 2015-11-17 DIAGNOSIS — I5033 Acute on chronic diastolic (congestive) heart failure: Secondary | ICD-10-CM | POA: Diagnosis not present

## 2015-11-17 DIAGNOSIS — I5032 Chronic diastolic (congestive) heart failure: Secondary | ICD-10-CM | POA: Diagnosis present

## 2015-11-17 DIAGNOSIS — I482 Chronic atrial fibrillation, unspecified: Secondary | ICD-10-CM | POA: Diagnosis present

## 2015-11-17 LAB — BRAIN NATRIURETIC PEPTIDE: B Natriuretic Peptide: 460 pg/mL — ABNORMAL HIGH (ref 0.0–100.0)

## 2015-11-17 LAB — PROTIME-INR
INR: 3.04
Prothrombin Time: 32.1 seconds — ABNORMAL HIGH (ref 11.4–15.2)

## 2015-11-17 LAB — FERRITIN: FERRITIN: 10 ng/mL — AB (ref 11–307)

## 2015-11-17 LAB — IRON AND TIBC
IRON: 16 ug/dL — AB (ref 28–170)
Saturation Ratios: 4 % — ABNORMAL LOW (ref 10.4–31.8)
TIBC: 456 ug/dL — ABNORMAL HIGH (ref 250–450)
UIBC: 440 ug/dL

## 2015-11-17 LAB — T4, FREE: FREE T4: 1.62 ng/dL — AB (ref 0.61–1.12)

## 2015-11-17 LAB — MAGNESIUM: Magnesium: 2.3 mg/dL (ref 1.7–2.4)

## 2015-11-17 LAB — I-STAT TROPONIN, ED: TROPONIN I, POC: 0.01 ng/mL (ref 0.00–0.08)

## 2015-11-17 LAB — TROPONIN I
Troponin I: <0.03 ng/mL (ref <0.03)
Troponin I: <0.03 ng/mL (ref <0.03)

## 2015-11-17 LAB — VITAMIN B12: Vitamin B-12: 583 pg/mL (ref 180–914)

## 2015-11-17 LAB — TSH: TSH: 2.619 u[IU]/mL (ref 0.350–4.500)

## 2015-11-17 MED ORDER — SODIUM CHLORIDE 0.9% FLUSH: 3.0000 mL | INTRAVENOUS | Status: DC | PRN

## 2015-11-17 MED ORDER — POTASSIUM CHLORIDE CRYS ER 20 MEQ PO TBCR
40.0000 meq | EXTENDED_RELEASE_TABLET | Freq: Once | ORAL | Status: AC
  Administered 2015-11-17: 40 meq via ORAL
  Filled 2015-11-17: qty 2

## 2015-11-17 MED ORDER — ACETAMINOPHEN 325 MG PO TABS
650.0000 mg | ORAL_TABLET | ORAL | Status: DC | PRN
  Administered 2015-11-24: 650 mg via ORAL
  Filled 2015-11-17: qty 2

## 2015-11-17 MED ORDER — METOPROLOL TARTRATE 5 MG/5ML IV SOLN
5.0000 mg | Freq: Once | INTRAVENOUS | Status: AC
  Administered 2015-11-17: 5 mg via INTRAVENOUS
  Filled 2015-11-17: qty 5

## 2015-11-17 MED ORDER — SODIUM CHLORIDE 0.9 % IV SOLN: 250.0000 mL | INTRAVENOUS | Status: DC | PRN

## 2015-11-17 MED ORDER — LEVOTHYROXINE SODIUM 50 MCG PO TABS
50.0000 ug | ORAL_TABLET | Freq: Every day | ORAL | Status: DC
  Administered 2015-11-17: 50 ug via ORAL
  Filled 2015-11-17: qty 1

## 2015-11-17 MED ORDER — OXYCODONE-ACETAMINOPHEN 5-325 MG PO TABS
1.0000 | ORAL_TABLET | ORAL | Status: DC | PRN
  Administered 2015-11-17 – 2015-11-18 (×5): 2 via ORAL
  Administered 2015-11-18: 1 via ORAL
  Administered 2015-11-19 (×2): 2 via ORAL
  Administered 2015-11-19: 1 via ORAL
  Administered 2015-11-19 – 2015-11-23 (×8): 2 via ORAL
  Filled 2015-11-17: qty 1
  Filled 2015-11-17 (×10): qty 2
  Filled 2015-11-17: qty 1
  Filled 2015-11-17: qty 2
  Filled 2015-11-17: qty 1
  Filled 2015-11-17: qty 2
  Filled 2015-11-17: qty 1
  Filled 2015-11-17 (×3): qty 2

## 2015-11-17 MED ORDER — METOPROLOL TARTRATE 5 MG/5ML IV SOLN: 2.5000 mg | INTRAVENOUS | Status: AC

## 2015-11-17 MED ORDER — FUROSEMIDE 10 MG/ML IJ SOLN
40.0000 mg | INTRAMUSCULAR | Status: AC
  Administered 2015-11-17: 40 mg via INTRAVENOUS
  Filled 2015-11-17: qty 4

## 2015-11-17 MED ORDER — ONDANSETRON HCL 4 MG/2ML IJ SOLN
4.0000 mg | Freq: Four times a day (QID) | INTRAMUSCULAR | Status: DC | PRN
  Administered 2015-11-19 – 2015-11-20 (×2): 4 mg via INTRAVENOUS
  Filled 2015-11-17 (×3): qty 2

## 2015-11-17 MED ORDER — APIXABAN 5 MG PO TABS
5.0000 mg | ORAL_TABLET | Freq: Two times a day (BID) | ORAL | Status: DC
  Administered 2015-11-18 – 2015-11-22 (×10): 5 mg via ORAL
  Filled 2015-11-17 (×10): qty 1

## 2015-11-17 MED ORDER — POTASSIUM CHLORIDE CRYS ER 20 MEQ PO TBCR
20.0000 meq | EXTENDED_RELEASE_TABLET | Freq: Two times a day (BID) | ORAL | Status: DC
  Administered 2015-11-17 (×3): 20 meq via ORAL
  Filled 2015-11-17 (×3): qty 1

## 2015-11-17 MED ORDER — ROSUVASTATIN CALCIUM 10 MG PO TABS
5.0000 mg | ORAL_TABLET | Freq: Every evening | ORAL | Status: DC
  Administered 2015-11-17 – 2015-11-23 (×7): 5 mg via ORAL
  Filled 2015-11-17 (×9): qty 1

## 2015-11-17 MED ORDER — FUROSEMIDE 10 MG/ML IJ SOLN
40.0000 mg | Freq: Two times a day (BID) | INTRAMUSCULAR | Status: DC
  Administered 2015-11-17: 40 mg via INTRAVENOUS
  Filled 2015-11-17: qty 4

## 2015-11-17 MED ORDER — METOPROLOL TARTRATE 5 MG/5ML IV SOLN
2.5000 mg | Freq: Four times a day (QID) | INTRAVENOUS | Status: DC | PRN
  Administered 2015-11-17 – 2015-11-22 (×6): 2.5 mg via INTRAVENOUS
  Filled 2015-11-17 (×8): qty 5

## 2015-11-17 MED ORDER — PROSIGHT PO TABS
1.0000 | ORAL_TABLET | Freq: Two times a day (BID) | ORAL | Status: DC
  Administered 2015-11-17 – 2015-11-24 (×14): 1 via ORAL
  Filled 2015-11-17 (×15): qty 1

## 2015-11-17 MED ORDER — LUBIPROSTONE 24 MCG PO CAPS
24.0000 ug | ORAL_CAPSULE | Freq: Every evening | ORAL | Status: DC
Start: 2015-11-17 — End: 2015-11-24
  Administered 2015-11-19 – 2015-11-23 (×5): 24 ug via ORAL
  Filled 2015-11-17 (×9): qty 1

## 2015-11-17 MED ORDER — DILTIAZEM HCL ER COATED BEADS 120 MG PO CP24
120.0000 mg | ORAL_CAPSULE | Freq: Every day | ORAL | Status: DC
  Administered 2015-11-17: 120 mg via ORAL
  Filled 2015-11-17: qty 1

## 2015-11-17 MED ORDER — METOPROLOL TARTRATE 25 MG PO TABS
25.0000 mg | ORAL_TABLET | Freq: Two times a day (BID) | ORAL | Status: DC
  Administered 2015-11-17 (×2): 25 mg via ORAL
  Filled 2015-11-17 (×2): qty 1

## 2015-11-17 MED ORDER — BOOST / RESOURCE BREEZE PO LIQD
1.0000 | Freq: Three times a day (TID) | ORAL | Status: DC
  Administered 2015-11-17 – 2015-11-19 (×6): 1 via ORAL

## 2015-11-17 MED ORDER — LEVOTHYROXINE SODIUM 75 MCG PO TABS
37.5000 ug | ORAL_TABLET | Freq: Every day | ORAL | Status: DC
  Administered 2015-11-18 – 2015-11-24 (×6): 37.5 ug via ORAL
  Filled 2015-11-17 (×6): qty 1

## 2015-11-17 MED ORDER — VITAMIN D 1000 UNITS PO TABS
2000.0000 [IU] | ORAL_TABLET | Freq: Every day | ORAL | Status: DC
  Administered 2015-11-17 – 2015-11-24 (×7): 2000 [IU] via ORAL
  Filled 2015-11-17 (×8): qty 2

## 2015-11-17 MED ORDER — DIGOXIN 125 MCG PO TABS: 0.1250 mg | ORAL_TABLET | Freq: Every day | ORAL | Status: DC

## 2015-11-17 MED ORDER — FUROSEMIDE 10 MG/ML IJ SOLN: 40.0000 mg | Freq: Every day | INTRAMUSCULAR | Status: DC

## 2015-11-17 MED ORDER — SODIUM CHLORIDE 0.9% FLUSH
3.0000 mL | Freq: Two times a day (BID) | INTRAVENOUS | Status: DC
  Administered 2015-11-17 – 2015-11-24 (×15): 3 mL via INTRAVENOUS

## 2015-11-17 MED ORDER — DIGOXIN 0.25 MG/ML IJ SOLN
0.2500 mg | Freq: Once | INTRAMUSCULAR | Status: AC
  Administered 2015-11-17: 0.25 mg via INTRAVENOUS
  Filled 2015-11-17: qty 2

## 2015-11-17 MED ORDER — FERROUS SULFATE 325 (65 FE) MG PO TABS
325.0000 mg | ORAL_TABLET | Freq: Every day | ORAL | Status: DC
  Administered 2015-11-18 – 2015-11-24 (×6): 325 mg via ORAL
  Filled 2015-11-17 (×6): qty 1

## 2015-11-17 NOTE — Discharge Instructions (Addendum)

## 2015-11-17 NOTE — H&P (Signed)
History and Physical    [name] ZYS:[id number] DOB: 1940/11/12 DOA: 11/16/2015  PCP: [name], MD   Patient coming from: Home   Chief Complaint: Palpitations   HPI: [name] is a 75 y.o. female with medical history significant for chronic atrial fibrillation on Eliquis,  hypothyroidism, coronary artery disease status post CABG, history of ischemic stroke, COPD, and chronic mid back pain who presents the emergency department for evaluation of palpitations. Patient has chronic atrial fibrillation for which she is anticoagulated with Eliquis and on a rate control strategy with low-dose diltiazem and Lopressor. She had been doing quite well on this regimen and had been in her usual state of health until approximately 2-3 days ago when she noted the onset of intermittent palpitations. There was no chest pain or dyspnea associated with this initially. Early this morning, the patient woke with palpitations and associated dyspnea, but no chest pain. This persisted throughout the day and the patient eventually took her evening medications a little bit early and then waited for symptoms to improve, but palpitations continued to persist and she came into the ED for evaluation of this. Patient had a nonproductive cough couple of days ago which seems to have resolved. She has not had any fevers, chills, chest pains, abdominal pain, dysuria, nausea, vomiting, or diarrhea. She reports strict adherence to her medication regimen.   ED Course: Upon arrival to the ED, patient is found to be afebrile, saturating 91% on room air, tachycardic in the 140s, and with stable blood pressure. EKG demonstrates atrial fibrillation with RVR, rate 142, and right axis deviation. Chest x-ray is notable for pulmonary vascular congestion with bilateral pleural effusions consistent with fluid overload. Chemistry panel is notable for a mild hypokalemia and CBC features a stable microcytic anemia with hemoglobin of 10.3 and MCV of  75.8. INR is 3.04, troponin is reassuring at 0.01, and BNP is elevated to 460. Patient was placed on supplemental oxygen at 3 L/m via nasal cannula with resolution of her subjective dyspnea. She was given a 40 mg IV push of Lasix in the emergency department and has begun to diurese. 5 mg IV Lopressor was administered with improvement and rates to the 110-120 range. Patient remains chest pain-free and was stable blood pressure in the emergency department. She'll be given additional IV Lopressor and monitored on the telemetry unit for ongoing evaluation and management of atrial fibrillation with RVR and acute on chronic diastolic CHF.   Review of Systems:  All other systems reviewed and apart from HPI, are negative.  Past Medical History:  Diagnosis Date  . Anxiety   . CAD (coronary artery disease), native coronary artery 2009   75% LAD, diffuse 75-90% RCA --> Referred for CABG + MAZE;  Cardiolite 12/2013: No ischemia or Infarction  . Cervical neck pain with evidence of disc disease    with need for surgery -- November 2015  . Chronic low back pain    scoliosis & lordosis  . CKD (chronic kidney disease)   . COPD (chronic obstructive pulmonary disease) (HCC)   . Dyslipidemia, goal LDL below 70    On Crestor, followed by PCP  . Essential hypertension   . H/O ischemic left MCA stroke 11/18/2014   MRI/MRA of head: Multifocal left MCA infarct.. Also possible left ACA territory infarct -- complete occlusion of left MCA distally at M2 level. Marked left ACA attenuation.  . H/O: pneumonia   . Hypothyroidism   . Ischemic cardiomyopathy 2012  EF ~40-45% by Echo  . Osteoarthritis of back    And neck, hands,spine  . PAD (peripheral artery disease) (HCC) 2007   s/p L Ileac A stent ; most recent Dopplers July 2012: Less than 50% reduction bilaterally. ABI 0.96 on the right 0.88 on left;;;12/27/2011   -ABI right .87 and left ABI .78  ,LEFT CIA and EIA stent normall patency, left CFA,SFA,and popliteal  0-49%; rgt proximal SFA 50-69%,rgt CIA,EIA, and CFA 0-49%  . Persistent atrial fibrillation (HCC) 04/05/2012   Now permanent A. fib s/p MAZE -- recurrence, cardioversion-converted to sinus bradycardia --> Now persistent  . Pulmonary hypertension (HCC)    Estimated PA pressure on Echo January 2017 = 59 mmHg with dilated IVC, severely dilated RA and moderate TR.  . S/P CABG x 2 2009   LIMA-LAD, SVG-RCA, with AV fistula ligation and Maze procedure  . Stroke (HCC)   . TIA (transient ischemic attack) 04/05/2015    Past Surgical History:  Procedure Laterality Date  . ABDOMINAL HYSTERECTOMY    . ANTERIOR CERVICAL DECOMP/DISCECTOMY FUSION N/A 01/23/2014   Procedure: ANTERIOR CERVICAL DECOMPRESSION/DISCECTOMY FUSION CERVICAL FOUR-FIVE,CERVICAL FIVE-SIX,CERVICAL SIX-SEVEN;  Surgeon: Mariam DollarGary P Cram, MD;  Location: [facility];  Service: Neurosurgery;  Laterality: N/A;  . CARDIAC CATHETERIZATION  07/30/2007   75% LAD ,3 stenoses of 75-90% in  RCA;circumflex from proximal RCA with no lesion seen,normal ramus intermediate branh; normal LV systoilc function  . CARDIOVERSION  04/05/2012   Procedure: CARDIOVERSION;  Surgeon: Thurmon FairMihai Croitoru, MD;  Location: [facility];  Service: Cardiovascular;  Laterality: N/A;  . COLONOSCOPY WITH PROPOFOL N/A 11/12/2014   Procedure: COLONOSCOPY WITH PROPOFOL;  Surgeon: Charna ElizabethJyothi Mann, MD;  Location: [facility];  Service: Endoscopy;  Laterality: N/A;  . CORONARY ARTERY BYPASS GRAFT  2009   LIMA-LAD, SVG-RCA (also MAZE & AV fistula ligation)  . ILIAC ARTERY STENT Left 04/06/2005   Ex Iliac - CFA (Smart STENTS -- 7x4 in EIA, 6 x 3 CFA)   . MAZE  2009   along with CABG  . NM CARDIOLITE LTD  12/2013   Non-gated for Afib; No Ischemia or Infarction.  Marland Kitchen. NM MYOVIEW LTD  May 2013   No ischemia or Infarct  . TRANSTHORACIC ECHOCARDIOGRAM  Jan 2012   EF ~40-45%, global HK; PAP ~45-50 mmHg  . TRANSTHORACIC ECHOCARDIOGRAM  Jan 2017   EF ~50%. No RWMA, Afib. Paradoxical Septal motion. no  DD assessment. Mod LA dilation.  PAP ~59 mmHg & dilated IVC. Mod TR. Severe RA dilation.     reports that she quit smoking about 17 years ago. Her smoking use included Cigarettes. She smoked 0.50 packs per day. She has never used smokeless tobacco. She reports that she does not drink alcohol or use drugs.  No Known Allergies  Family History  Problem Relation Age of Onset  . Heart attack Mother     Late 5070s  . Stroke Mother   . Stroke Father   . Heart attack Father     3280s  . Heart disease Brother 60    Cardiomegaly  . Stroke Sister   . Heart attack Son [id number]     Prior to Admission medications   Medication Sig Start Date End Date Taking? Authorizing Provider  AMITIZA 24 MCG capsule Take 24 mcg by mouth every evening.  03/17/15  Yes Historical Provider, MD  apixaban (ELIQUIS) 5 MG TABS tablet Take 1 tablet (5 mg total) by mouth 2 (two) times daily. 11/19/14  Yes Joseph ArtJessica U Vann, DO  Cholecalciferol (VITAMIN D3) 2000 units TABS Take 1 tablet by mouth daily.   Yes Historical Provider, MD  diltiazem (CARDIZEM CD) 120 MG 24 hr capsule Take 1 capsule (120 mg total) by mouth daily. NEED OV. 10/22/15  Yes Marykay Lexavid W Harding, MD  furosemide (LASIX) 20 MG tablet Take 1 tablet (20 mg total) by mouth every other day. 09/24/15  Yes Darreld McleanVishal Patel, MD  levothyroxine (SYNTHROID, LEVOTHROID) 50 MCG tablet Take 1 tablet (50 mcg total) by mouth daily before breakfast. 10/25/15  Yes Marykay Lexavid W Harding, MD  metoprolol (LOPRESSOR) 50 MG tablet TAKE 0.5 TABLETS (25 MG TOTAL) BY MOUTH 2 (TWO) TIMES DAILY. 09/27/15  Yes Marykay Lexavid W Harding, MD  Multiple Vitamins-Minerals (PRESERVISION AREDS 2) CAPS Take 1 capsule by mouth 2 (two) times daily.   Yes Historical Provider, MD  oxyCODONE-acetaminophen (PERCOCET/ROXICET) 5-325 MG tablet Take 1 tablet by mouth every 6 (six) hours as needed for moderate pain.  08/19/15  Yes Historical Provider, MD  rosuvastatin (CRESTOR) 5 MG tablet TAKE 1 TABLET BY MOUTH IN THE EVENING 10/19/15  Yes  Marykay Lexavid W Harding, MD  feeding supplement (BOOST / RESOURCE BREEZE) LIQD Take 1 Container by mouth 3 (three) times daily between meals. Patient not taking: Reported on 11/17/2015 09/23/15   Darreld McleanVishal Patel, MD  senna-docusate (SENOKOT-S) 8.6-50 MG tablet Take 1 tablet by mouth at bedtime as needed for mild constipation. Patient not taking: Reported on 11/17/2015 04/09/15   Richarda OverlieNayana Abrol, MD    Physical Exam: Vitals:   11/17/15 0015 11/17/15 0030 11/17/15 0045 11/17/15 0100  BP: 115/81 129/89 124/92 (!) 131/107  Pulse: (!) 27 102 (!) 151 (!) 152  Resp: 16 19 (!) 42 19  Temp:      TempSrc:      SpO2: 99% 100% 99% 98%      Constitutional: NAD, calm, comfortable. Cachectic Eyes: PERTLA, lids and conjunctivae normal ENMT: Mucous membranes are moist. Posterior pharynx clear of any exudate or lesions.   Neck: normal, supple, no masses, no thyromegaly Respiratory: Crackles b/l mid-lung zones, no wheeze or rhonchi. Normal respiratory effort.   Cardiovascular: Rate ~120 and irregular. Trace pretibial edema b/l. Neck veins distended. Abdomen: No distension, no tenderness, no masses palpated. Bowel sounds normal.  Musculoskeletal: no clubbing / cyanosis. No joint deformity upper and lower extremities.   Skin: no significant rashes, lesions, ulcers. Warm, dry, well-perfused. Neurologic: CN 2-12 grossly intact. Sensation intact, DTR normal. Strength 5/5 in all 4 limbs.  Psychiatric: Normal judgment and insight. Alert and oriented x 3. Normal mood and affect.     Labs on Admission: I have personally reviewed following labs and imaging studies  CBC:  Recent Labs Lab 11/14/15 1344 11/16/15 2102  WBC 9.0 7.9  NEUTROABS 7.3  --   HGB 10.3* 10.3*  HCT 38.0 37.9  MCV 75.4* 75.8*  PLT 342 329   Basic Metabolic Panel:  Recent Labs Lab 11/14/15 1344 11/16/15 2102  NA 135 135  K 3.9 3.4*  CL 98* 99*  CO2 28 28  GLUCOSE 121* 245*  BUN 19 13  CREATININE 0.91 0.98  CALCIUM 8.5* 8.7*    GFR: CrCl cannot be calculated (Unknown ideal weight.). Liver Function Tests: No results for input(s): AST, ALT, ALKPHOS, BILITOT, PROT, ALBUMIN in the last 168 hours. No results for input(s): LIPASE, AMYLASE in the last 168 hours. No results for input(s): AMMONIA in the last 168 hours. Coagulation Profile:  Recent Labs Lab 11/16/15 2350  INR 3.04   Cardiac Enzymes: No results  for input(s): CKTOTAL, CKMB, CKMBINDEX, TROPONINI in the last 168 hours. BNP (last 3 results) No results for input(s): PROBNP in the last 8760 hours. HbA1C: No results for input(s): HGBA1C in the last 72 hours. CBG: No results for input(s): GLUCAP in the last 168 hours. Lipid Profile: No results for input(s): CHOL, HDL, LDLCALC, TRIG, CHOLHDL, LDLDIRECT in the last 72 hours. Thyroid Function Tests: No results for input(s): TSH, T4TOTAL, FREET4, T3FREE, THYROIDAB in the last 72 hours. Anemia Panel: No results for input(s): VITAMINB12, FOLATE, FERRITIN, TIBC, IRON, RETICCTPCT in the last 72 hours. Urine analysis:    Component Value Date/Time   COLORURINE YELLOW 04/06/2015 0852   APPEARANCEUR CLOUDY (A) 04/06/2015 0852   LABSPEC 1.019 04/06/2015 0852   PHURINE 6.5 04/06/2015 0852   GLUCOSEU NEGATIVE 04/06/2015 0852   HGBUR NEGATIVE 04/06/2015 0852   BILIRUBINUR NEGATIVE 04/06/2015 0852   KETONESUR NEGATIVE 04/06/2015 0852   PROTEINUR NEGATIVE 04/06/2015 0852   UROBILINOGEN 0.2 11/03/2014 2115   NITRITE NEGATIVE 04/06/2015 0852   LEUKOCYTESUR MODERATE (A) 04/06/2015 0852   Sepsis Labs: @LABRCNTIP (procalcitonin:4,lacticidven:4) )No results found for this or any previous visit (from the past 240 hour(s)).   Radiological Exams on Admission: Dg Chest 2 View  Result Date: 11/16/2015 CLINICAL DATA:  Atrial fibrillation and short of breath EXAM: CHEST  2 VIEW COMPARISON:  09/21/2015 FINDINGS: Postop CABG. Heart size mildly enlarged. Pulmonary vascular congestion slightly increased. Bilateral pleural  effusions left greater than right similar to the prior study. Bibasilar atelectasis unchanged. Negative for pulmonary edema. IMPRESSION: Pulmonary vascular congestion with bilateral pleural effusions consistent with fluid overload. Bibasilar atelectasis left greater than right unchanged from the prior study. Electronically Signed   By: Marlan Palau M.D.   On: 11/16/2015 21:51    EKG: Independently reviewed. Atrial fibrillation with RVR (rate 142), RAD   Assessment/Plan  1. Chronic atrial fibrillation with RVR  - Presents with 1 day of palpitations and found to have a fib RVR with rate as high as 170's - Lopressor 5 mg IVP given in ED with reduction in rate to 120 range, will given addittonal dose(s) for goal HR <110 - CHADS-VASc is ~10 (age x2, gender, CHF, hx of CVA x2, CAD, HTN) - Continue AC with Eliquis  - She is rate-controlled at home with low-dose Cardizem and Lopressor; had previously required higher doses and digoxin - Plan to observe on telemetry and control with metoprolol IVP overnight and will likely need increased dosing of her oral rate-control agents    2. Acute on chronic diastolic CHF   - Presents with SOB, elevated BNP, vascular congestion and effusions on CXR  - TTE (04/06/15) with EF 50%, moderate LAE, moderate TR, severely elevated PA peak pressures - Suspect the current vol o/l is secondary to rapid rate, controlling that as above  - Takes Lasix 20 mg qD at home; given 40 mg IV in ED, will continue with 40 mg IV q12h for now and adjust prn  - Daily BMP during diuresis  - SLIV, fluid-restrict diet, follow daily wts and I/O's  - Update TTE, ordered   3. Microcytic anemia  - Hgb 10.3 on admission with MCV 75.8  - Both indices appear stable and there is no evidence for active losses  - Check iron studies, B12, folate, and supplement as needed    4. CAD - S/p CABG  - No anginal complaints  - Troponin 0.01 on admission  - Continue Lopressor, Crestor    5.  Hypertension  -  At goal currently  - Continue diltiazem and Lopressor; will likely need dose-increase d/t RVR    6. Hypothyroidism  - T4 was mildly elevated with normal TSH in June 2017 and dose recently reduced from 75 mcg to 50 mcg - Given her RVR, will check TSH and free T4  - Continue current-dose Synthroid for now    7. Hypokalemia  - Serum potassium 3.4 on admission; likely secondary to Lasix use  - Given 20 mEq oral potassium, will continue BID during diuresis and check BMP qAM  - Check mag level and replete prn     DVT prophylaxis: Eliquis  Code Status: DNR Family Communication: Daughter updated at bedside  Disposition Plan: Observe on telemetry  Consults called: None Admission status: Observation    Briscoe Deutscher, MD [facility] Pager [phone]  If 7PM-7AM, please contact night-coverage [email or web address] Password [email or web address]  11/17/2015, 1:24 AM

## 2015-11-17 NOTE — Progress Notes (Signed)
Moved patient's Synthroid to 07:00 originally scheduled for 08:00 so she could have at least 30 mins before breakfast. Patient removed hat from the toilet, unable to record output. Advised patient not to remove as this is how we are able to measure her urine output.

## 2015-11-17 NOTE — Progress Notes (Addendum)
Initial Nutrition Assessment  DOCUMENTATION CODES:   Non-severe (moderate) malnutrition in context of chronic illness, Underweight  INTERVENTION:    Continue Boost Breeze po TID, each supplement provides 250 kcal and 9 grams of protein  NUTRITION DIAGNOSIS:   Malnutrition related to chronic illness as evidenced by moderate depletion of body fat, moderate depletions of muscle mass  GOAL:   Patient will meet greater than or equal to 90% of their needs  MONITOR:   PO intake, Supplement acceptance, Labs, Weight trends, I & O's  REASON FOR ASSESSMENT:   Consult Assessment of nutrition requirement/status  ASSESSMENT:   75 yo Female with a PMH of CAD s/p CABG in 2009, chronic atrial fibrillation(on Eliquis), CVA, COPD, HTN, ischemic cardiomyopathy (EF 50%), and CKD. She presented to the ED on 11/16/15 with palpitations and SOB.   Patient sleeping with emesis basin on bed. Seen per Clinical Nutrition back in June 2017. Pt with hx of poor PO intake and nausea.. Dx with non-severe (moderate) malnutrition which is ongoing. Had Boost Breeze ordered during previous hospitalization >> will order at this time.  Nutrition-Focused physical exam completed 09/23/15. Findings are moderate fat depletion, moderate muscle depletion, and no edema.   Diet Order:  Diet Heart Room service appropriate? Yes; Fluid consistency: Thin; Fluid restriction: 1500 mL Fluid  Skin:  Reviewed, no issues  Last BM:  8/14  Height:   Ht Readings from Last 1 Encounters:  09/18/15 5' (1.524 m)    Weight:   Wt Readings from Last 1 Encounters:  11/17/15 84 lb 6.4 oz (38.3 kg)    Ideal Body Weight:  45.4 kg  BMI:  Body mass index is 16.48 kg/m.  Estimated Nutritional Needs:   Kcal:  1150-1350  Protein:  40-55 gm  Fluid:  >/= 1.5 L  EDUCATION NEEDS:   No education needs identified at this time  Maureen ChattersKatie Jordon Bourquin, RD, LDN Pager #: [phone] After-Hours Pager #: [phone]

## 2015-11-17 NOTE — Progress Notes (Signed)
PROGRESS NOTE                                                                                                                                                                                                             Patient Demographics:    [name], is a 75 y.o. female, DOB - 10/17/1940, BJY:[id number]  Admit date - 11/16/2015   Admitting Physician Briscoe Deutscherimothy S Opyd, MD  Outpatient Primary MD for the patient is Salli RealYun Sun, MD  LOS - 0  Chief Complaint  Patient presents with  . Palpitations  . Shortness of Breath       Brief Narrative   75 y.o. female with history significant for chronic atrial fibrillation on Eliquis,  hypothyroidism, coronary artery disease status post CABG, history of ischemic stroke, COPD, and chronic mid back pain who presents the emergency department for evaluation of palpitations, Workup significant for A. fib with RVR heart rate and 170s.   Subjective:    [name] today has, No headache, No chest pain, No abdominal pain - No Nausea, Ports feeling of generalized weakness and fatigue.  Assessment  & Plan :    Principal Problem:   Atrial fibrillation with RVR (HCC) Active Problems:   2 Vessel CAD - s/p CABG; LIMA-LAD, SVG-RCA   Essential hypertension   Hypothyroidism   H/O ischemic left MCA stroke   Chronic atrial fibrillation (HCC)   Chronic anticoagulation   Hypokalemia   Microcytic anemia   Acute on chronic diastolic CHF (congestive heart failure) (HCC)   Chronic atrial fibrillation with RVR (HCC)  Chronic atrial fibrillation with RVR  -  a fib RVR with rate as high as 170's on admission - CHADS-VASc is 59~8 (age x2, gender, CHF, hx of CVA x2, CAD, HTN) - Continue AC with Eliquis  - She is rate-controlled at home with low-dose Cardizem and Lopressor; had previously required higher doses and digoxin - Resume back on her home medication Cardizem and Lopressor, soft blood pressure, can't increase those meds, will  give one time IV metoprolol, cardiology consulted to guarding further recommendation. - Replete potassium  Acute on chronic diastolic CHF   - Presents with SOB, elevated BNP, vascular congestion and effusions on CXR  - TTE (04/06/15) with EF 50%, moderate LAE, moderate TR, severely elevated PA peak pressures - Suspect the current vol o/l  is secondary to rapid rate, controlling that as above  - She is on 20 mg oral Lasix daily, will continue with Lasix 40 mg IV, will monitor closely giving soft blood pressure and hypokalemia. - Daily BMP during diuresis  - SLIV, fluid-restrict diet, follow daily wts and I/O's  - Update TTE, ordered    Microcytic anemia  - Hgb 10.3 on admission with MCV 75.8  - Both indices appear stable and there is no evidence for active losses  - Workup significant for iron deficiency anemia, started on oral iron supplement, in folate level. - Most recent anoscopy 2016.  CAD - S/p CABG  - No anginal complaints  - Continue Lopressor, Crestor    Hypertension  - At goal currently  - Continue diltiazem and Lopressor; will likely need dose-increase d/t RVR     Hypothyroidism  - T4 was mildly elevated with normal TSH in June 2017 and dose recently reduced from 75 mcg to 50 mcg - TSH 2.6, within normal limits, free T4 is 1.6, mildly elevated, will decrease Synthroid from 50 g to 37.5 g, will need repeat levels in 6 weeks.  Hypokalemia  - repleted     Code Status : DNR  Family Communication  : None at bedside  Disposition Plan  : home when stable  Consults  : cardiology  Procedures  : none  DVT Prophylaxis  :  Eliquis  Lab Results  Component Value Date   PLT 329 11/16/2015    Antibiotics  :    Anti-infectives    None        Objective:   Vitals:   11/17/15 0220 11/17/15 0308 11/17/15 0336 11/17/15 0824  BP: 123/74  106/65 106/62  Pulse: 89  78   Resp: 19  16   Temp: 97.6 F (36.4 C)  97.5 F (36.4 C)   TempSrc: Oral  Oral   SpO2:  100%  100% 100%  Weight:  38.3 kg (84 lb 6.4 oz)      Wt Readings from Last 3 Encounters:  11/17/15 38.3 kg (84 lb 6.4 oz)  09/23/15 38.7 kg (85 lb 6.4 oz)  09/13/15 40.9 kg (90 lb 3.2 oz)    No intake or output data in the 24 hours ending 11/17/15 1103   Physical Exam  Awake Alert, Oriented X 3,  [place].AT,PERRAL Supple Neck,No JVD,   Symmetrical Chest wall movement, Good air movement bilaterally, CTAB Irr Irr, tachy,No Gallops,Rubs or new Murmurs, No Parasternal Heave +ve B.Sounds, Abd Soft, No tenderness, No rebound - guarding or rigidity. No Cyanosis, Clubbing or edema, No new Rash or bruise     Data Review:    CBC  Recent Labs Lab 11/14/15 1344 11/16/15 2102  WBC 9.0 7.9  HGB 10.3* 10.3*  HCT 38.0 37.9  PLT 342 329  MCV 75.4* 75.8*  MCH 20.4* 20.6*  MCHC 27.1* 27.2*  RDW 18.1* 18.3*  LYMPHSABS 1.0  --   MONOABS 0.6  --   EOSABS 0.0  --   BASOSABS 0.0  --     Chemistries   Recent Labs Lab 11/14/15 1344 11/16/15 2102 11/17/15 0145  NA 135 135  --   K 3.9 3.4*  --   CL 98* 99*  --   CO2 28 28  --   GLUCOSE 121* 245*  --   BUN 19 13  --   CREATININE 0.91 0.98  --   CALCIUM 8.5* 8.7*  --   MG  --   --  2.3   ------------------------------------------------------------------------------------------------------------------ No results for input(s): CHOL, HDL, LDLCALC, TRIG, CHOLHDL, LDLDIRECT in the last 72 hours.  Lab Results  Component Value Date   HGBA1C 5.9 (H) 04/06/2015   ------------------------------------------------------------------------------------------------------------------  Recent Labs  11/17/15 0145  TSH 2.619   ------------------------------------------------------------------------------------------------------------------  Recent Labs  11/17/15 0145  VITAMINB12 583  FERRITIN 10*  TIBC 456*  IRON 16*    Coagulation profile  Recent Labs Lab 11/16/15 2350  INR 3.04    No results for input(s): DDIMER in the  last 72 hours.  Cardiac Enzymes  Recent Labs Lab 11/17/15 0145 11/17/15 0944  TROPONINI <0.03 <0.03   ------------------------------------------------------------------------------------------------------------------    Component Value Date/Time   BNP 460.0 (H) 11/16/2015 2350    Inpatient Medications  Scheduled Meds: . [START ON 11/18/2015] apixaban  5 mg Oral BID  . cholecalciferol  2,000 Units Oral Daily  . diltiazem  120 mg Oral Daily  . feeding supplement  1 Container Oral TID BM  . [START ON 11/18/2015] furosemide  40 mg Intravenous Daily  . levothyroxine  50 mcg Oral QAC breakfast  . lubiprostone  24 mcg Oral QPM  . metoprolol  25 mg Oral BID  . multivitamin  1 tablet Oral BID  . potassium chloride  20 mEq Oral BID  . rosuvastatin  5 mg Oral QPM  . sodium chloride flush  3 mL Intravenous Q12H   Continuous Infusions:  PRN Meds:.sodium chloride, acetaminophen, ondansetron (ZOFRAN) IV, oxyCODONE-acetaminophen, sodium chloride flush  Micro Results No results found for this or any previous visit (from the past 240 hour(s)).  Radiology Reports Dg Chest 2 View  Result Date: 11/16/2015 CLINICAL DATA:  Atrial fibrillation and short of breath EXAM: CHEST  2 VIEW COMPARISON:  09/21/2015 FINDINGS: Postop CABG. Heart size mildly enlarged. Pulmonary vascular congestion slightly increased. Bilateral pleural effusions left greater than right similar to the prior study. Bibasilar atelectasis unchanged. Negative for pulmonary edema. IMPRESSION: Pulmonary vascular congestion with bilateral pleural effusions consistent with fluid overload. Bibasilar atelectasis left greater than right unchanged from the prior study. Electronically Signed   By: Marlan Palauharles  Clark M.D.   On: 11/16/2015 21:51     Jaxton Casale M.D on 11/17/2015 at 11:03 AM  Between 7am to 7pm - Pager - [phone]  After 7pm go to [email or web address] - password [email or web address]  [facility] -  Office  [phone]

## 2015-11-17 NOTE — Care Management Note (Signed)
Case Management Note  Patient Details  Name: Brenda RinksRobina B Glass MRN: [id number] Date of Birth: 06/12/1940  Subjective/Objective:   Pt admitted with SOB/afib                 Action/Plan: PTA pt lived at home has daughter nearby- [facility] for [facility] needs- in the past pt has refused HH services- has a [employer]gallery [employer] nurse that does telephonic f/u with pt- CM will follow for any d/c needs for this admission-  Expected Discharge Date:                  Expected Discharge Plan:  Home/[facility]  In-House Referral:     Discharge planning Services  CM Consult  Post Acute Care Choice:    Choice offered to:     DME Arranged:    DME Agency:     HH Arranged:    HH Agency:     Status of Service:  In process, will continue to follow  If discussed at Long Length of Stay Meetings, dates discussed:    Additional Comments:  Darrold SpanWebster, Usher Hedberg Hall, RN 11/17/2015, 2:15 PM

## 2015-11-17 NOTE — Consult Note (Signed)
Cardiology Consult    Patient ID: [name] MRN: [id number], DOB/AGE: 76/07/1940   Admit date: 11/16/2015 Date of Consult: 11/17/2015  Primary Physician: Salli RealYun Sun, MD Reason for Consult: Afib RVR Primary Cardiologist: Dr. Herbie BaltimoreHarding Requesting Provider: Dr. Randol KernElgergawy  Patient Profile  [name] is a 75 year old female with a past medical history of CAD s/p CABG in 2009, chronic atrial fibrillation(on Eliquis), CVA, COPD, HTN, ischemic cardiomyopathy (EF 50%), and CKD. She presented to the ED on 11/16/15 with palpitations and SOB.   History of Present Illness  [name] says that 2-3 days ago she started feeling short of breath, had frequent palpitations especially with minimal activity and was nauseous. Yesterday she was awakened with palpitations and dyspnea that persisted throughout the day. She decided to come to the ED when symptoms did not abate. She was found to be in Afib RVR with rate of 140.   She was admitted recently in June of this year for Afib RVR in the setting of community acquired pneumonia. She was restarted on Digoxin at discharge, it had previously been discontinued in Feb. Of this year as she was confused about the dosing and her digoxin level was elevated at 2.7. However, there is no digoxin listed on her home med list currently and she is unsure of what she is taking.   According to her office notes, she has ongoing trouble with her afib, she often develops a rapid rate and becomes symptomatic. She take 25mg  BID metoprolol at home and has been instructed in the past to take an extra 25mg  if she feels palpitations. However, she tells me she can no longer keep up with her medications and her daughter does this for her.   She continues to feel bad today, very nauseous and weak. Her HR is elevated in 120-150 range, AFib. She denies chest pain, does feel dyspneic at rest. She tells me that she is in pain most of the time from her back. She is unable to sleep most nights because  the pain is so severe.   Past Medical History   Past Medical History:  Diagnosis Date  . Anxiety   . CAD (coronary artery disease), native coronary artery 2009   75% LAD, diffuse 75-90% RCA --> Referred for CABG + MAZE;  Cardiolite 12/2013: No ischemia or Infarction  . Cervical neck pain with evidence of disc disease    with need for surgery -- November 2015  . Chronic low back pain    scoliosis & lordosis  . CKD (chronic kidney disease)   . COPD (chronic obstructive pulmonary disease) (HCC)   . Dyslipidemia, goal LDL below 70    On Crestor, followed by PCP  . Essential hypertension   . H/O ischemic left MCA stroke 11/18/2014   MRI/MRA of head: Multifocal left MCA infarct.. Also possible left ACA territory infarct -- complete occlusion of left MCA distally at M2 level. Marked left ACA attenuation.  . H/O: pneumonia   . Hypothyroidism   . Ischemic cardiomyopathy 2012   EF ~40-45% by Echo  . Osteoarthritis of back    And neck, hands,spine  . PAD (peripheral artery disease) (HCC) 2007   s/p L Ileac A stent ; most recent Dopplers July 2012: Less than 50% reduction bilaterally. ABI 0.96 on the right 0.88 on left;;;12/27/2011   -ABI right .87 and left ABI .78  ,LEFT CIA and EIA stent normall patency, left CFA,SFA,and popliteal 0-49%; rgt proximal SFA 50-69%,rgt CIA,EIA, and  CFA 0-49%  . Persistent atrial fibrillation (HCC) 04/05/2012   Now permanent A. fib s/p MAZE -- recurrence, cardioversion-converted to sinus bradycardia --> Now persistent  . Pulmonary hypertension (HCC)    Estimated PA pressure on Echo January 2017 = 59 mmHg with dilated IVC, severely dilated RA and moderate TR.  . S/P CABG x 2 2009   LIMA-LAD, SVG-RCA, with AV fistula ligation and Maze procedure  . Stroke (HCC)   . TIA (transient ischemic attack) 04/05/2015    Past Surgical History:  Procedure Laterality Date  . ABDOMINAL HYSTERECTOMY    . ANTERIOR CERVICAL DECOMP/DISCECTOMY FUSION N/A 01/23/2014   Procedure:  ANTERIOR CERVICAL DECOMPRESSION/DISCECTOMY FUSION CERVICAL FOUR-FIVE,CERVICAL FIVE-SIX,CERVICAL SIX-SEVEN;  Surgeon: Mariam Dollar, MD;  Location: [facility];  Service: Neurosurgery;  Laterality: N/A;  . CARDIAC CATHETERIZATION  07/30/2007   75% LAD ,3 stenoses of 75-90% in  RCA;circumflex from proximal RCA with no lesion seen,normal ramus intermediate branh; normal LV systoilc function  . CARDIOVERSION  04/05/2012   Procedure: CARDIOVERSION;  Surgeon: Thurmon Fair, MD;  Location: [facility];  Service: Cardiovascular;  Laterality: N/A;  . COLONOSCOPY WITH PROPOFOL N/A 11/12/2014   Procedure: COLONOSCOPY WITH PROPOFOL;  Surgeon: Charna Elizabeth, MD;  Location: [facility];  Service: Endoscopy;  Laterality: N/A;  . CORONARY ARTERY BYPASS GRAFT  2009   LIMA-LAD, SVG-RCA (also MAZE & AV fistula ligation)  . ILIAC ARTERY STENT Left 04/06/2005   Ex Iliac - CFA (Smart STENTS -- 7x4 in EIA, 6 x 3 CFA)   . MAZE  2009   along with CABG  . NM CARDIOLITE LTD  12/2013   Non-gated for Afib; No Ischemia or Infarction.  Marland Kitchen NM MYOVIEW LTD  May 2013   No ischemia or Infarct  . TRANSTHORACIC ECHOCARDIOGRAM  Jan 2012   EF ~40-45%, global HK; PAP ~45-50 mmHg  . TRANSTHORACIC ECHOCARDIOGRAM  Jan 2017   EF ~50%. No RWMA, Afib. Paradoxical Septal motion. no DD assessment. Mod LA dilation.  PAP ~59 mmHg & dilated IVC. Mod TR. Severe RA dilation.     Allergies  No Known Allergies  Inpatient Medications    . [START ON 11/18/2015] apixaban  5 mg Oral BID  . cholecalciferol  2,000 Units Oral Daily  . diltiazem  120 mg Oral Daily  . feeding supplement  1 Container Oral TID BM  . furosemide  40 mg Intravenous Q12H  . levothyroxine  50 mcg Oral QAC breakfast  . lubiprostone  24 mcg Oral QPM  . metoprolol  25 mg Oral BID  . multivitamin  1 tablet Oral BID  . potassium chloride  20 mEq Oral BID  . rosuvastatin  5 mg Oral QPM  . sodium chloride flush  3 mL Intravenous Q12H    Family History    Family History   Problem Relation Age of Onset  . Heart attack Mother     Late 52s  . Stroke Mother   . Stroke Father   . Heart attack Father     50s  . Heart disease Brother 60    Cardiomegaly  . Stroke Sister   . Heart attack Son 82    Social History    Social History   Social History  . Marital status: Widowed    Spouse name: N/A  . Number of children: N/A  . Years of education: N/A   Occupational History  . Not on file.   Social History Main Topics  . Smoking status: Former Smoker  Packs/day: 0.50    Types: Cigarettes    Quit date: 01/07/1998  . Smokeless tobacco: Never Used  . Alcohol use No  . Drug use: No  . Sexual activity: Not on file   Other Topics Concern  . Not on file   Social History Narrative    She is a divorced mother of 1 and one child who died. Her daughter is here with her   today. She has got 4 grandchildren. She is a native of [place] and only has an occasional alcoholic   beverage. She does not get routine exercise mostly because of her spine pain. She did previously smoke but quit many years ago.      Review of Systems    General:  No chills, fever, night sweats or weight changes.  Cardiovascular:  No chest pain, dyspnea on exertion, edema, orthopnea, palpitations, paroxysmal nocturnal dyspnea. Dermatological: No rash, lesions/masses Respiratory: No cough, dyspnea Urologic: No hematuria, dysuria Abdominal:   + nausea, vomiting, diarrhea, bright red blood per rectum, melena, or hematemesis Neurologic:  No visual changes, wkns, changes in mental status. All other systems reviewed and are otherwise negative except as noted above.  Physical Exam    Blood pressure 106/62, pulse 78, temperature 97.5 F (36.4 C), temperature source Oral, resp. rate 16, weight 84 lb 6.4 oz (38.3 kg), SpO2 100 %.  General: Pleasant, elderly lady Psych: Normal affect. Neuro: Alert and oriented X 3. Moves all extremities spontaneously. HEENT: Normal  Neck: Supple  without bruits or JVD. Lungs:  Resp regular and unlabored, CTA. Heart: IRRR no s3, s4, or murmurs. Abdomen: Soft, non-tender, non-distended, BS + x 4.  Extremities: No clubbing, cyanosis or edema. DP/PT/Radials 2+ and equal bilaterally.  Labs    Troponin [facility] Test)  Recent Labs  11/17/15 0003  TROPIPOC 0.01    Recent Labs  11/17/15 0145  TROPONINI <0.03   Lab Results  Component Value Date   WBC 7.9 11/16/2015   HGB 10.3 (L) 11/16/2015   HCT 37.9 11/16/2015   MCV 75.8 (L) 11/16/2015   PLT 329 11/16/2015    Recent Labs Lab 11/16/15 2102  NA 135  K 3.4*  CL 99*  CO2 28  BUN 13  CREATININE 0.98  CALCIUM 8.7*  GLUCOSE 245*     Radiology Studies    Dg Chest 2 View  Result Date: 11/16/2015 CLINICAL DATA:  Atrial fibrillation and short of breath EXAM: CHEST  2 VIEW COMPARISON:  09/21/2015 FINDINGS: Postop CABG. Heart size mildly enlarged. Pulmonary vascular congestion slightly increased. Bilateral pleural effusions left greater than right similar to the prior study. Bibasilar atelectasis unchanged. Negative for pulmonary edema. IMPRESSION: Pulmonary vascular congestion with bilateral pleural effusions consistent with fluid overload. Bibasilar atelectasis left greater than right unchanged from the prior study. Electronically Signed   By: Marlan Palau M.D.   On: 11/16/2015 21:51    EKG & Cardiac Imaging    EKG: Afib with RVR  Echocardiogram: Study Conclusions  - Left ventricle: The cavity size was normal. Systolic function was   mildly to moderately reduced. The estimated ejection fraction was   50%. Wall motion was normal; there were no regional wall motion   abnormalities. The study was not technically sufficient to allow   evaluation of LV diastolic dysfunction due to atrial   fibrillation. - Ventricular septum: Septal motion showed paradox. - Aortic valve: There was no regurgitation. - Aortic root: The aortic root was normal in size. - Mitral  valve:  Calcified annulus. Mildly thickened leaflets .   Transvalvular velocity was within the normal range. There was no   evidence for stenosis. - Left atrium: The atrium was moderately dilated. - Right ventricle: The cavity size was moderately dilated. Wall   thickness was normal. Systolic function was moderately reduced. - Right atrium: The atrium was severely dilated. - Tricuspid valve: There was moderate regurgitation. - Pulmonic valve: Structurally normal valve. There was mild   regurgitation. - Pulmonary arteries: Systolic pressure was severely increased. PA   peak pressure: 59 mm Hg (S). - Inferior vena cava: The vessel was dilated. The respirophasic   diameter changes were blunted (< 50%), consistent with elevated   central venous pressure. - Pericardium, extracardiac: There was no pericardial effusion.   Assessment & Plan  1. Atrial fibrillation with rapid ventricular response: Patient has history of chronic atrial fibrillation, presents with palpitations and nausea. Can add prn IV metoprolol for rate control. Can also consider adding digoxin back for rate control. She has some soft blood pressures, I do not think she would tolerate diltiazem gtt.   This patients CHA2DS2-VASc Score and unadjusted Ischemic Stroke Rate (% per year) is equal to 10.8 % stroke rate/year from a score of 8 Above score calculated as 1 point each if present [CHF, HTN, DM, Vascular=MI/PAD/Aortic Plaque, Age if 65-74, or Female], 2 points each if present [Age > 75, or Stroke/TIA/TE]  Continue Eliquis for anticoagulation.   2. History of CAD s/p CABG: Had nonischemic nuclear study in 2015, denies chest pain. On ASA, statin and beta blocker.   3. History of ischemic cardiomyopathy: Last EF was 50% in 2016, Echo pending this admission. BNP elevated at 460. Agree with gentle IV diuresis.   Signed, Little IshikawaErin E Smith, NP 11/17/2015, 10:35 AM Pager: [phone]  Patient seen, examined. Available data reviewed.  Agree with findings, assessment, and plan as outlined by Suzzette RighterErin Smith, NP. The patient is independently interviewed and examined. This is a frail-appearing woman in NAD, lungs with prolonged expiration otherwise clear, heart irregularly irregular without murmur, extremities without edema. Agree with rate-control with metoprolol, add digoxin - 0.25 mg IV x 1 then 0.125 mg daily. Will use IV metoprolol as needed if HR > 120 bpm but BP is soft so will need to be judicious with this. Will follow with you.  Tonny BollmanMichael Erik Nessel, M.D. 11/17/2015 5:27 PM

## 2015-11-17 NOTE — Progress Notes (Addendum)
Patient received from ED and oriented to room. Tele monitoring called in, skin assessment complete. Daughter accompanied from ED. Pain medication given, repositioned in bed. Call light within reach.

## 2015-11-17 NOTE — Progress Notes (Signed)
Pt HR up to 160's Pt is asymptomatic says she was going to the bathroom when her monitor started beeping. CCMD called made aware pt is up and moving. MD updated on pt HR. Pt has scheduled Cardizem and Metoprolol due. Will continue to monitor pt.

## 2015-11-17 NOTE — Care Management Obs Status (Signed)
MEDICARE OBSERVATION STATUS NOTIFICATION   Patient Details  Name: [name] MRN: [id number] Date of Birth: 04/18/1940   Medicare Observation Status Notification Given:  Yes    Darrold SpanWebster, Feliza Diven Hall, RN 11/17/2015, 2:37 PM

## 2015-11-18 ENCOUNTER — Inpatient Hospital Stay (HOSPITAL_COMMUNITY): Payer: Medicare HMO

## 2015-11-18 DIAGNOSIS — E43 Unspecified severe protein-calorie malnutrition: Secondary | ICD-10-CM | POA: Insufficient documentation

## 2015-11-18 DIAGNOSIS — Z951 Presence of aortocoronary bypass graft: Secondary | ICD-10-CM | POA: Diagnosis not present

## 2015-11-18 DIAGNOSIS — Z681 Body mass index (BMI) 19 or less, adult: Secondary | ICD-10-CM | POA: Diagnosis not present

## 2015-11-18 DIAGNOSIS — I255 Ischemic cardiomyopathy: Secondary | ICD-10-CM | POA: Diagnosis present

## 2015-11-18 DIAGNOSIS — I440 Atrioventricular block, first degree: Secondary | ICD-10-CM | POA: Diagnosis not present

## 2015-11-18 DIAGNOSIS — I130 Hypertensive heart and chronic kidney disease with heart failure and stage 1 through stage 4 chronic kidney disease, or unspecified chronic kidney disease: Secondary | ICD-10-CM | POA: Diagnosis present

## 2015-11-18 DIAGNOSIS — Z981 Arthrodesis status: Secondary | ICD-10-CM | POA: Diagnosis not present

## 2015-11-18 DIAGNOSIS — I10 Essential (primary) hypertension: Secondary | ICD-10-CM | POA: Diagnosis not present

## 2015-11-18 DIAGNOSIS — Z79899 Other long term (current) drug therapy: Secondary | ICD-10-CM | POA: Diagnosis not present

## 2015-11-18 DIAGNOSIS — I2510 Atherosclerotic heart disease of native coronary artery without angina pectoris: Secondary | ICD-10-CM | POA: Diagnosis present

## 2015-11-18 DIAGNOSIS — E440 Moderate protein-calorie malnutrition: Secondary | ICD-10-CM | POA: Diagnosis present

## 2015-11-18 DIAGNOSIS — I272 Other secondary pulmonary hypertension: Secondary | ICD-10-CM | POA: Diagnosis present

## 2015-11-18 DIAGNOSIS — I4891 Unspecified atrial fibrillation: Secondary | ICD-10-CM | POA: Diagnosis not present

## 2015-11-18 DIAGNOSIS — I472 Ventricular tachycardia: Secondary | ICD-10-CM | POA: Diagnosis not present

## 2015-11-18 DIAGNOSIS — N189 Chronic kidney disease, unspecified: Secondary | ICD-10-CM | POA: Diagnosis present

## 2015-11-18 DIAGNOSIS — I509 Heart failure, unspecified: Secondary | ICD-10-CM | POA: Diagnosis not present

## 2015-11-18 DIAGNOSIS — E039 Hypothyroidism, unspecified: Secondary | ICD-10-CM | POA: Diagnosis present

## 2015-11-18 DIAGNOSIS — I495 Sick sinus syndrome: Secondary | ICD-10-CM | POA: Diagnosis present

## 2015-11-18 DIAGNOSIS — D509 Iron deficiency anemia, unspecified: Secondary | ICD-10-CM | POA: Diagnosis present

## 2015-11-18 DIAGNOSIS — I739 Peripheral vascular disease, unspecified: Secondary | ICD-10-CM | POA: Diagnosis present

## 2015-11-18 DIAGNOSIS — J449 Chronic obstructive pulmonary disease, unspecified: Secondary | ICD-10-CM | POA: Diagnosis present

## 2015-11-18 DIAGNOSIS — I482 Chronic atrial fibrillation: Secondary | ICD-10-CM | POA: Diagnosis not present

## 2015-11-18 DIAGNOSIS — R002 Palpitations: Secondary | ICD-10-CM | POA: Diagnosis present

## 2015-11-18 DIAGNOSIS — F419 Anxiety disorder, unspecified: Secondary | ICD-10-CM | POA: Diagnosis present

## 2015-11-18 DIAGNOSIS — I5043 Acute on chronic combined systolic (congestive) and diastolic (congestive) heart failure: Secondary | ICD-10-CM | POA: Diagnosis present

## 2015-11-18 DIAGNOSIS — E876 Hypokalemia: Secondary | ICD-10-CM | POA: Diagnosis present

## 2015-11-18 DIAGNOSIS — Z9981 Dependence on supplemental oxygen: Secondary | ICD-10-CM | POA: Diagnosis not present

## 2015-11-18 DIAGNOSIS — E785 Hyperlipidemia, unspecified: Secondary | ICD-10-CM | POA: Diagnosis present

## 2015-11-18 DIAGNOSIS — I481 Persistent atrial fibrillation: Secondary | ICD-10-CM | POA: Diagnosis present

## 2015-11-18 DIAGNOSIS — J9610 Chronic respiratory failure, unspecified whether with hypoxia or hypercapnia: Secondary | ICD-10-CM | POA: Diagnosis present

## 2015-11-18 DIAGNOSIS — Z8673 Personal history of transient ischemic attack (TIA), and cerebral infarction without residual deficits: Secondary | ICD-10-CM | POA: Diagnosis not present

## 2015-11-18 DIAGNOSIS — Z7901 Long term (current) use of anticoagulants: Secondary | ICD-10-CM | POA: Diagnosis not present

## 2015-11-18 DIAGNOSIS — I5033 Acute on chronic diastolic (congestive) heart failure: Secondary | ICD-10-CM | POA: Diagnosis not present

## 2015-11-18 LAB — BASIC METABOLIC PANEL
Anion gap: 6 (ref 5–15)
BUN: 13 mg/dL (ref 6–20)
CHLORIDE: 98 mmol/L — AB (ref 101–111)
CO2: 36 mmol/L — ABNORMAL HIGH (ref 22–32)
Calcium: 8.8 mg/dL — ABNORMAL LOW (ref 8.9–10.3)
Creatinine, Ser: 1.05 mg/dL — ABNORMAL HIGH (ref 0.44–1.00)
GFR calc non Af Amer: 50 mL/min — ABNORMAL LOW (ref >60)
GFR, EST AFRICAN AMERICAN: 58 mL/min — AB (ref >60)
Glucose, Bld: 87 mg/dL (ref 65–99)
POTASSIUM: 5.6 mmol/L — AB (ref 3.5–5.1)
SODIUM: 140 mmol/L (ref 135–145)

## 2015-11-18 LAB — FOLATE RBC
Folate, Hemolysate: >620 ng/mL
Folate, RBC: >1732 ng/mL (ref >498)
Hematocrit: 35.8 % (ref 34.0–46.6)

## 2015-11-18 LAB — ECHOCARDIOGRAM COMPLETE
Height: 60 in
Weight: 1350.4 oz

## 2015-11-18 LAB — POTASSIUM: POTASSIUM: 3.8 mmol/L (ref 3.5–5.1)

## 2015-11-18 MED ORDER — DIGOXIN 125 MCG PO TABS
0.1250 mg | ORAL_TABLET | Freq: Every day | ORAL | Status: DC
  Administered 2015-11-19 – 2015-11-24 (×6): 0.125 mg via ORAL
  Filled 2015-11-18 (×6): qty 1

## 2015-11-18 MED ORDER — METOPROLOL TARTRATE 50 MG PO TABS
50.0000 mg | ORAL_TABLET | Freq: Two times a day (BID) | ORAL | Status: DC
  Administered 2015-11-18 – 2015-11-19 (×3): 50 mg via ORAL
  Filled 2015-11-18 (×3): qty 1

## 2015-11-18 MED ORDER — SODIUM POLYSTYRENE SULFONATE 15 GM/60ML PO SUSP
15.0000 g | Freq: Once | ORAL | Status: AC
  Administered 2015-11-18: 15 g via ORAL
  Filled 2015-11-18: qty 60

## 2015-11-18 MED ORDER — FUROSEMIDE 40 MG PO TABS
40.0000 mg | ORAL_TABLET | Freq: Two times a day (BID) | ORAL | Status: DC
  Administered 2015-11-18 – 2015-11-19 (×2): 40 mg via ORAL
  Filled 2015-11-18 (×3): qty 1

## 2015-11-18 MED ORDER — DIGOXIN 0.25 MG/ML IJ SOLN
0.2500 mg | Freq: Once | INTRAMUSCULAR | Status: AC
  Administered 2015-11-18: 0.25 mg via INTRAVENOUS
  Filled 2015-11-18: qty 2

## 2015-11-18 NOTE — Progress Notes (Signed)
*  PRELIMINARY RESULTS* Echocardiogram 2D Echocardiogram has been performed.  Jeryl Columbialliott, York Valliant 11/18/2015, 2:55 PM

## 2015-11-18 NOTE — Evaluation (Addendum)
Physical Therapy Evaluation Patient Details Name: [name] MRN: [id number] DOB: 02/08/1941 Today's Date: 11/18/2015   History of Present Illness  75 y.o.femalewith history significant for chronic atrial fibrillation on Eliquis, hypothyroidism, coronary artery disease status post CABG, history of ischemic stroke, COPD, and chronic mid back pain who presents the emergency department for evaluation of palpitations, Workup significant for A. fib with RVR heart rate and 170s.  Clinical Impression  Pt admitted with above diagnosis. Pt currently with functional limitations due to the deficits listed below (see PT Problem List). Pt refused to get up once EOB due to nausea.  Only able to asssess bed mobility and ROM/Exercise.  Will follow acutely.   Pt will benefit from skilled PT to increase their independence and safety with mobility to allow discharge to the venue listed below.      Follow Up Recommendations No PT follow up- pt refuses HHPT;Supervision - Intermittent    Equipment Recommendations  Other (comment) (TBA)   Recommendations for Other Services       Precautions / Restrictions Precautions Precautions: Fall Restrictions Weight Bearing Restrictions: No      Mobility  Bed Mobility Overal bed mobility: Needs Assistance Bed Mobility: Supine to Sit     Supine to sit: Min guard Sit to supine: Min guard   General bed mobility comments: Pt did not need assist to come to EOB however upon sitting, she c/o nausea therefore allowed pt to lie back down as nausea did not improve after 2 min.    Transfers                 General transfer comment: Pt declined OOB or standing due to nausea.  Ambulation/Gait                Stairs            Wheelchair Mobility    Modified Rankin (Stroke Patients Only)       Balance Overall balance assessment: Needs assistance Sitting-balance support: No upper extremity supported;Feet supported Sitting balance-Leahy  Scale: Good                                       Pertinent Vitals/Pain Pain Assessment: No/denies pain  HR 86-109 bpm. Pt on 3LO2.    Home Living Family/patient expects to be discharged to:: Private residence Living Arrangements: Children Available Help at Discharge: Family;Available 24 hours/day (daughter and 76 year old grandson) Type of Home: House Home Access: Level entry     Home Layout: Two level;Able to live on main level with bedroom/bathroom Home Equipment: Tub bench (2-3 LO2) Additional Comments: uses no AD at home    Prior Function Level of Independence: Needs assistance   Gait / Transfers Assistance Needed: walked with no AD but slowly  ADL's / Homemaking Assistance Needed: supervision/setup with bathing at shower level (upstairs)  Comments: hand held assist or support on shopping cart when out in community     Hand Dominance   Dominant Hand: Right    Extremity/Trunk Assessment   Upper Extremity Assessment: Defer to OT evaluation           Lower Extremity Assessment: Generalized weakness      Cervical / Trunk Assessment: Kyphotic  Communication   Communication: No difficulties  Cognition Arousal/Alertness: Awake/alert Behavior During Therapy: Flat affect Overall Cognitive Status: Within Functional Limits for tasks assessed  General Comments General comments (skin integrity, edema, etc.): HR went from 86-109 bpm with sitting to EOB only.  Nausea reported.    Exercises General Exercises - Lower Extremity Ankle Circles/Pumps: AROM;Both;10 reps Quad Sets: AROM;Both;10 reps Long Arc Quad: AROM;Both;10 reps;Seated      Assessment/Plan    PT Assessment Patient needs continued PT services  PT Diagnosis Difficulty walking;Generalized weakness   PT Problem List Decreased range of motion;Decreased strength;Decreased activity tolerance;Decreased balance;Decreased mobility;Decreased knowledge of use of  DME;Decreased safety awareness;Cardiopulmonary status limiting activity  PT Treatment Interventions DME instruction;Gait training;Functional mobility training;Therapeutic activities;Therapeutic exercise;Balance training;Neuromuscular re-education;Patient/family education   PT Goals (Current goals can be found in the Care Plan section) Acute Rehab PT Goals Patient Stated Goal: get home and feel better PT Goal Formulation: With patient Time For Goal Achievement: 12/02/15 Potential to Achieve Goals: Good    Frequency Min 3X/week   Barriers to discharge        Co-evaluation               End of Session Equipment Utilized During Treatment: Gait belt;Oxygen Activity Tolerance: Patient limited by fatigue (limited by nausea) Patient left: in bed;with call bell/phone within reach;with bed alarm set Nurse Communication: Mobility status    Functional Assessment Tool Used: clinical judgment Functional Limitation: Changing and maintaining body position Changing and Maintaining Body Position Current Status (Z6109(G8981): At least 1 percent but less than 20 percent impaired, limited or restricted Changing and Maintaining Body Position Goal Status (U0454(G8982): 0 percent impaired, limited or restricted    Time: [id number] PT Time Calculation (min) (ACUTE ONLY): 10 min   Charges:   PT Evaluation $PT Eval Moderate Complexity: 1 Procedure     PT G Codes:   PT G-Codes **NOT FOR INPATIENT CLASS** Functional Assessment Tool Used: clinical judgment Functional Limitation: Changing and maintaining body position Changing and Maintaining Body Position Current Status (N8295(G8981): At least 1 percent but less than 20 percent impaired, limited or restricted Changing and Maintaining Body Position Goal Status (A2130(G8982): 0 percent impaired, limited or restricted    Berline LopesWhite, [name] 11/18/2015, 1:28 PM Wylie Russon,PT Acute Rehabilitation [phone] (pager)

## 2015-11-18 NOTE — Progress Notes (Signed)
    Subjective:  Feeling ok. No palpitations this am. No dyspnea.   Objective:  Vital Signs in the last 24 hours: Temp:  [97.4 F (36.3 C)-98 F (36.7 C)] 97.7 F (36.5 C) (08/17 19140608) Pulse Rate:  [53-99] 74 (08/17 0608) Resp:  [20-21] 20 (08/17 0608) BP: (95-109)/(60-73) 95/60 (08/17 0608) SpO2:  [99 %-100 %] 99 % (08/17 0608)  Intake/Output from previous day: 08/16 0701 - 08/17 0700 In: 480 [P.O.:480] Out: 1100 [Urine:1100]  Physical Exam: Pt is alert and oriented, elderly woman in NAD HEENT: normal Neck: JVP - normal Lungs: clear but decreased air movement and prolonged expiratory phase bilaterally CV: tachy and irregular without murmur or gallop Abd: soft, NT, Positive BS, no hepatomegaly Ext: no edema Skin: warm/dry no rash   Lab Results:  Recent Labs  11/16/15 2102  WBC 7.9  HGB 10.3*  PLT 329    Recent Labs  11/16/15 2102 11/18/15 0239  NA 135 140  K 3.4* 5.6*  CL 99* 98*  CO2 28 36*  GLUCOSE 245* 87  BUN 13 13  CREATININE 0.98 1.05*    Recent Labs  11/17/15 0944 11/17/15 1327  TROPONINI <0.03 <0.03    Tele: Atrial fibrillation, HR this am in the 120's  Assessment/Plan:  1. Atrial fibrillation with RVR: continue anticoagulation with apixaban. Increase metoprolol to 50 mg BID and give an additional dose of IV digoxin. Back to oral digoxin at low dose tomorrow. Stop diltiazem.  2. 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 Volume appears improved will stop IV lasix and resume oral lasix. Stop dilt. Titrate beta-blocker.   Will follow with you. Cecille Pothx   Carizma Dunsworth, M.D. 11/18/2015, 8:10 AM

## 2015-11-18 NOTE — Progress Notes (Addendum)
PROGRESS NOTE                                                                                                                                                                                                             Patient Demographics:    [name], is a 75 y.o. female, DOB - 01/16/1941, [id number]  Admit date - 11/16/2015   Admitting Physician Brenda Deutscherimothy S Opyd, MD  Outpatient Primary MD for the patient is Brenda RealYun Sun, MD  LOS - 0  Chief Complaint  Patient presents with  . Palpitations  . Shortness of Breath       Brief Narrative   75 y.o. female with history significant for chronic atrial fibrillation on Eliquis,  hypothyroidism, coronary artery disease status post CABG, history of ischemic stroke, COPD, and chronic mid back pain who presents the emergency department for evaluation of palpitations, Workup significant for A. fib with RVR heart rate and 170s.   Subjective:    [name] today has, No headache, No chest pain, No abdominal pain - No Nausea, Reports feeling of generalized weakness and fatigue.  Assessment  & Plan :    Principal Problem:   Atrial fibrillation with RVR (HCC) Active Problems:   2 Vessel CAD - s/p CABG; LIMA-LAD, SVG-RCA   Essential hypertension   Hypothyroidism   H/O ischemic left MCA stroke   Chronic atrial fibrillation (HCC)   Chronic anticoagulation   Hypokalemia   Microcytic anemia   Acute on chronic diastolic CHF (congestive heart failure) (HCC)   Chronic atrial fibrillation with RVR (HCC)   Malnutrition of moderate degree  Chronic atrial fibrillation with RVR  -  a fib RVR with rate as high as 170's on admission - CHADS-VASc is 49~8 (age x2, gender, CHF, hx of CVA x2, CAD, HTN) - Continue AC with Eliquis  - She is rate-controlled at home with low-dose Cardizem and Lopressor; had previously required higher doses and digoxin - Management per cardiology, on digoxin, was increased by cardiology, on  Cardizem been stopped, cardiology titrating metoprolol up.  Acute on chronic diastolic CHF   - Presents with SOB, elevated BNP, vascular congestion and effusions on CXR  - TTE (04/06/15) with EF 50%, moderate LAE, moderate TR, severely elevated PA peak pressures - Suspect the current vol o/l is secondary to rapid rate, controlling that as  above  - Diuresis per cardiology, transitioned from IV to oral Lasix - Daily BMP during diuresis  - SLIV, fluid-restrict diet, follow daily wts and I/O's  - Echo pending   Microcytic anemia  - Hgb 10.3 on admission with MCV 75.8  - Both indices appear stable and there is no evidence for active losses  - Workup significant for iron deficiency anemia, started on oral iron supplement,  folate level pending. - Most recent colonoscopy in 2016.  CAD - S/p CABG  - No anginal complaints  - Continue Lopressor, Crestor    Hypertension  - Blood pressure on the lower side, on beta blockers for heart rate control   Hypothyroidism  - T4 was mildly elevated with normal TSH in June 2017 and dose recently reduced from 75 mcg to 50 mcg - TSH 2.6, within normal limits, free T4 is 1.6, mildly elevated, will decrease Synthroid from 50 g to 37.5 g, will need repeat levels in 6 weeks.  Hypokalemia / hyperkalemia - repleted, today with hyperkalemia, will give one dose Kayexalate, will hold supplements, recheck in a.m.  Chronic respiratory failure - Patient reports she is on oxygen at home.  Non-severe (moderate) malnutrition -  in context of chronic illness, Underweight, seen by nutritionist  Code Status : DNR  Family Communication  : None at bedside  Disposition Plan  : home when stable, pending PT evaluation  Consults  : cardiology  Procedures  : none  DVT Prophylaxis  :  Eliquis  Lab Results  Component Value Date   PLT 329 11/16/2015    Antibiotics  :    Anti-infectives    None        Objective:   Vitals:   11/17/15 1332 11/17/15  2008 11/17/15 2200 11/18/15 0608  BP: 97/67 109/73  95/60  Pulse: 99 (!) 53  74  Resp: (!) 21 20  20   Temp: 97.4 F (36.3 C) 98 F (36.7 C)  97.7 F (36.5 C)  TempSrc: Oral Oral  Oral  SpO2: 100% 100%  99%  Weight:      Height:   5' (1.524 m)     Wt Readings from Last 3 Encounters:  11/17/15 38.3 kg (84 lb 6.4 oz)  09/23/15 38.7 kg (85 lb 6.4 oz)  09/13/15 40.9 kg (90 lb 3.2 oz)     Intake/Output Summary (Last 24 hours) at 11/18/15 1042 Last data filed at 11/17/15 1900  Gross per 24 hour  Intake              480 ml  Output             1100 ml  Net             -620 ml     Physical Exam  Awake Alert, Oriented X 3,  [name],PERRAL Supple Neck,No JVD,   Symmetrical Chest wall movement, Good air movement bilaterally, CTAB Irr Irr, tachy,No Gallops,Rubs or new Murmurs, No Parasternal Heave +ve B.Sounds, Abd Soft, No tenderness, No rebound - guarding or rigidity. No Cyanosis, Clubbing or edema, No new Rash or bruise     Data Review:    CBC  Recent Labs Lab 11/14/15 1344 11/16/15 2102  WBC 9.0 7.9  HGB 10.3* 10.3*  HCT 38.0 37.9  PLT 342 329  MCV 75.4* 75.8*  MCH 20.4* 20.6*  MCHC 27.1* 27.2*  RDW 18.1* 18.3*  LYMPHSABS 1.0  --   MONOABS 0.6  --   EOSABS 0.0  --  BASOSABS 0.0  --     Chemistries   Recent Labs Lab 11/14/15 1344 11/16/15 2102 11/17/15 0145 11/18/15 0239  NA 135 135  --  140  K 3.9 3.4*  --  5.6*  CL 98* 99*  --  98*  CO2 28 28  --  36*  GLUCOSE 121* 245*  --  87  BUN 19 13  --  13  CREATININE 0.91 0.98  --  1.05*  CALCIUM 8.5* 8.7*  --  8.8*  MG  --   --  2.3  --    ------------------------------------------------------------------------------------------------------------------ No results for input(s): CHOL, HDL, LDLCALC, TRIG, CHOLHDL, LDLDIRECT in the last 72 hours.  Lab Results  Component Value Date   HGBA1C 5.9 (H) 04/06/2015    ------------------------------------------------------------------------------------------------------------------  Recent Labs  11/17/15 0145  TSH 2.619   ------------------------------------------------------------------------------------------------------------------  Recent Labs  11/17/15 0145  VITAMINB12 583  FERRITIN 10*  TIBC 456*  IRON 16*    Coagulation profile  Recent Labs Lab 11/16/15 2350  INR 3.04    No results for input(s): DDIMER in the last 72 hours.  Cardiac Enzymes  Recent Labs Lab 11/17/15 0145 11/17/15 0944 11/17/15 1327  TROPONINI <0.03 <0.03 <0.03   ------------------------------------------------------------------------------------------------------------------    Component Value Date/Time   BNP 460.0 (H) 11/16/2015 2350    Inpatient Medications  Scheduled Meds: . apixaban  5 mg Oral BID  . cholecalciferol  2,000 Units Oral Daily  . [START ON 11/19/2015] digoxin  0.125 mg Oral Daily  . feeding supplement  1 Container Oral TID BM  . ferrous sulfate  325 mg Oral Q breakfast  . furosemide  40 mg Oral BID  . levothyroxine  37.5 mcg Oral QAC breakfast  . lubiprostone  24 mcg Oral QPM  . metoprolol  50 mg Oral BID  . multivitamin  1 tablet Oral BID  . rosuvastatin  5 mg Oral QPM  . sodium chloride flush  3 mL Intravenous Q12H   Continuous Infusions:  PRN Meds:.sodium chloride, acetaminophen, metoprolol, ondansetron (ZOFRAN) IV, oxyCODONE-acetaminophen, sodium chloride flush  Micro Results No results found for this or any previous visit (from the past 240 hour(s)).  Radiology Reports Dg Chest 2 View  Result Date: 11/16/2015 CLINICAL DATA:  Atrial fibrillation and short of breath EXAM: CHEST  2 VIEW COMPARISON:  09/21/2015 FINDINGS: Postop CABG. Heart size mildly enlarged. Pulmonary vascular congestion slightly increased. Bilateral pleural effusions left greater than right similar to the prior study. Bibasilar atelectasis  unchanged. Negative for pulmonary edema. IMPRESSION: Pulmonary vascular congestion with bilateral pleural effusions consistent with fluid overload. Bibasilar atelectasis left greater than right unchanged from the prior study. Electronically Signed   By: Marlan Palau M.D.   On: 11/16/2015 21:51     ELGERGAWY, DAWOOD M.D on 11/18/2015 at 10:42 AM  Between 7am to 7pm - Pager - [phone]  After 7pm go to [email or web address] - password [email or web address]  [facility] -  Office  [phone]

## 2015-11-19 LAB — BASIC METABOLIC PANEL
Anion gap: 8 (ref 5–15)
BUN: 11 mg/dL (ref 6–20)
CALCIUM: 8 mg/dL — AB (ref 8.9–10.3)
CO2: 38 mmol/L — ABNORMAL HIGH (ref 22–32)
CREATININE: 0.88 mg/dL (ref 0.44–1.00)
Chloride: 92 mmol/L — ABNORMAL LOW (ref 101–111)
GFR calc Af Amer: >60 mL/min (ref >60)
GLUCOSE: 71 mg/dL (ref 65–99)
Potassium: 3.5 mmol/L (ref 3.5–5.1)
SODIUM: 138 mmol/L (ref 135–145)

## 2015-11-19 MED ORDER — METOPROLOL TARTRATE 50 MG PO TABS: 50.0000 mg | ORAL_TABLET | Freq: Once | ORAL | Status: DC

## 2015-11-19 MED ORDER — POTASSIUM CHLORIDE CRYS ER 20 MEQ PO TBCR
20.0000 meq | EXTENDED_RELEASE_TABLET | Freq: Every day | ORAL | Status: DC
  Administered 2015-11-19 – 2015-11-24 (×4): 20 meq via ORAL
  Filled 2015-11-19 (×5): qty 1

## 2015-11-19 MED ORDER — METOPROLOL TARTRATE 50 MG PO TABS
75.0000 mg | ORAL_TABLET | Freq: Two times a day (BID) | ORAL | Status: DC
  Administered 2015-11-19 – 2015-11-20 (×2): 75 mg via ORAL
  Filled 2015-11-19 (×2): qty 1

## 2015-11-19 MED ORDER — FUROSEMIDE 40 MG PO TABS
40.0000 mg | ORAL_TABLET | Freq: Every day | ORAL | Status: DC
  Administered 2015-11-20 – 2015-11-24 (×4): 40 mg via ORAL
  Filled 2015-11-19 (×5): qty 1

## 2015-11-19 NOTE — Progress Notes (Signed)
Patient Name: [name]: 11/19/2015     Principal Problem:   Atrial fibrillation with RVR (HCC) Active Problems:   2 Vessel CAD - s/p CABG; LIMA-LAD, SVG-RCA   Essential hypertension   Hypothyroidism   H/O ischemic left MCA stroke   Chronic atrial fibrillation (HCC)   Chronic anticoagulation   Hypokalemia   Microcytic anemia   Acute on chronic diastolic CHF (congestive heart failure) (HCC)   Chronic atrial fibrillation with RVR (HCC)   Malnutrition of moderate degree   Atrial fibrillation (HCC)    SUBJECTIVE  Feeling lousy because she is upset her heart is giving her so much trouble. No CP and breathing much better.   CURRENT MEDS . apixaban  5 mg Oral BID  . cholecalciferol  2,000 Units Oral Daily  . digoxin  0.125 mg Oral Daily  . feeding supplement  1 Container Oral TID BM  . ferrous sulfate  325 mg Oral Q breakfast  . furosemide  40 mg Oral BID  . levothyroxine  37.5 mcg Oral QAC breakfast  . lubiprostone  24 mcg Oral QPM  . metoprolol  50 mg Oral BID  . multivitamin  1 tablet Oral BID  . potassium chloride  20 mEq Oral Daily  . rosuvastatin  5 mg Oral QPM  . sodium chloride flush  3 mL Intravenous Q12H    OBJECTIVE  Vitals:   11/18/15 0608 11/18/15 1324 11/18/15 2057 11/19/15 0449  BP: 95/60 115/68 103/64 108/61  Pulse: 74 90 90 85  Resp: 20 20 17 18   Temp: 97.7 F (36.5 C) 97.9 F (36.6 C) 97.9 F (36.6 C) 98 F (36.7 C)  TempSrc: Oral Oral Oral Oral  SpO2: 99% 100% 100% 100%  Weight:    79 lb 5.9 oz (36 kg)  Height:        Intake/Output Summary (Last 24 hours) at 11/19/15 1037 Last data filed at 11/19/15 0800  Gross per 24 hour  Intake             1497 ml  Output             2000 ml  Net             -503 ml   Filed Weights   11/17/15 0308 11/19/15 0449  Weight: 84 lb 6.4 oz (38.3 kg) 79 lb 5.9 oz (36 kg)    PHYSICAL EXAM  Pt is alert and oriented, elderly woman in NAD HEENT: normal Neck: JVP - normal Lungs:  clear but decreased air movement and prolonged expiratory phase bilaterally CV:  irregular without murmur or gallop Abd: soft, NT, Positive BS, no hepatomegaly Ext: no edema Skin: warm/dry no rash    Accessory Clinical Findings  CBC  Recent Labs  11/16/15 2102 11/17/15 0145  WBC 7.9  --   HGB 10.3*  --   HCT 37.9 35.8  MCV 75.8*  --   PLT 329  --    Basic Metabolic Panel  Recent Labs  11/17/15 0145 11/18/15 0239 11/18/15 1130 11/19/15 0302  NA  --  140  --  138  K  --  5.6* 3.8 3.5  CL  --  98*  --  92*  CO2  --  36*  --  38*  GLUCOSE  --  87  --  71  BUN  --  13  --  11  CREATININE  --  1.05*  --  0.88  CALCIUM  --  8.8*  --  8.0*  MG 2.3  --   --   --     Cardiac Enzymes  Recent Labs  11/17/15 0145 11/17/15 0944 11/17/15 1327  TROPONINI <0.03 <0.03 <0.03    Thyroid Function Tests  Recent Labs  11/17/15 0145  TSH 2.619    TELE  afib with CVR  Radiology/Studies  Dg Chest 2 View  Result Date: 11/16/2015 CLINICAL DATA:  Atrial fibrillation and short of breath EXAM: CHEST  2 VIEW COMPARISON:  09/21/2015 FINDINGS: Postop CABG. Heart size mildly enlarged. Pulmonary vascular congestion slightly increased. Bilateral pleural effusions left greater than right similar to the prior study. Bibasilar atelectasis unchanged. Negative for pulmonary edema. IMPRESSION: Pulmonary vascular congestion with bilateral pleural effusions consistent with fluid overload. Bibasilar atelectasis left greater than right unchanged from the prior study. Electronically Signed   By: Marlan Palauharles  Clark M.D.   On: 11/16/2015 21:51   2D ECHO: 11/18/2015 LV EF: 45% -   50% Study Conclusions - Left ventricle: The cavity size was normal. There was mild   concentric hypertrophy. Systolic function was mildly reduced. The   estimated ejection fraction was in the range of 45% to 50%.   Diffuse hypokinesis. The study is not technically sufficient to   allow evaluation of LV diastolic  function. - Aortic valve: Transvalvular velocity was within the normal range.   There was no stenosis. There was no regurgitation. - Mitral valve: Calcified annulus. Mildly thickened leaflets .   Transvalvular velocity was within the normal range. There was no   evidence for stenosis. There was mild regurgitation. - Left atrium: The atrium was severely dilated. - Right ventricle: The cavity size was normal. Wall thickness was   normal. Systolic function was normal. - Atrial septum: No defect or patent foramen ovale was identified   by color flow Doppler. - Tricuspid valve: There was moderate regurgitation. - Pulmonary arteries: Systolic pressure was severely increased. PA   peak pressure: 59 mm Hg (S). - Pericardium, extracardiac: There was a large left pleural   effusion. Impressions: - LVEF may be underestimated due to ventricular ectopy.   ASSESSMENT AND PLAN [name] is a 75 year old female with a past medical history of CAD s/p CABG in 2009, chronic atrial fibrillation (on Eliquis), Hx of CVA, COPD, HTN, ischemic cardiomyopathy (EF 45-50%) and CKD who presented to the ED on 11/16/15 with palpitations and SOB and found to be in afib with RVR HR 140s.  Atrial fibrillation with RVR: continue anticoagulation with apixaban for CHADSVASC score of 8.  Metoprolol increased to 50 mg BID yesterday and given an additional dose of IV digoxin. Restarted back on oral digoxin at low dose today. Diltiazem discontinued. I was paged by RN for HRs in 180s and she was given 5mg  IV lopressor x1. Now HR in 70s. She seems to get tachycardic with exertion. Will increase Lopressor to 75mg  BID.  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 Volume appears improved after IV lasix 40mg  x2. She was resumed on oral lasix 40mg  BID. Dilt was discontinued in order to further titrate beta-blocker.   History of CAD s/p CABG: Had nonischemic nuclear study in 2015, denies chest pain. On ASA, statin  and beta blocker.   History of ischemic cardiomyopathy: EF was 50% in 2016, Echo this admission with EF 45-50%. BNP elevated at 460. Agree with gentle IV diuresis.   HTN: BPs have been soft.    Signed, Cline CrockKathryn Thompson PA-C  Pager [phone]  Patient seen,  examined. Available data reviewed. Agree with findings, assessment, and plan as outlined by Cline Crock, PA-C. The patient is an elderly-appearing woman in no distress. Lung fields are diminished but clear. Heart is irregularly irregular but well rate controlled at present. Extremities without edema. The patient continues to have labile heart rates with atrial fib with RVR associated with activity. Her resting heart rates are much improved. Would increase metoprolol to 75 mg twice daily. Continue digoxin 0.125 mg daily. Will add a digoxin level to her lab work tomorrow. Discussed plan with the patient and her daughter. Her heart failure is now better compensated and furosemide has been decreased to 40 mg once daily. I would anticipate that she might be stable for discharge tomorrow.  Tonny Bollman, M.D. 11/19/2015 1:03 PM

## 2015-11-19 NOTE — Progress Notes (Signed)
Physical Therapy Treatment and Discharge Patient Details Name: ITZAYANA PARDY MRN: [id number] DOB: [birth year] Today's Date: 11/19/2015    History of Present Illness 75 y.o.femalewith history significant for chronic atrial fibrillation on Eliquis, hypothyroidism, coronary artery disease status post CABG, history of ischemic stroke, COPD, and chronic mid back pain who presents the emergency department for evaluation of palpitations, Workup significant for A. fib with RVR heart rate and 170s.    PT Comments    Pt moving modified independent and demonstrates good balance, but mobility is limited by elevated HR.  Pt's HR ranged from low 100's at rest to a max of 178 nonsustained during mobility.  Feel if pt's HR can be managed he will be able to mobilize independently.  Pt does not have a need for further PT, but encouraged to continue to mobilize herself in her room independently as she has been.  Will sign off.    Follow Up Recommendations  No PT follow up;Supervision - Intermittent     Equipment Recommendations  None recommended by PT    Recommendations for Other Services       Precautions / Restrictions Precautions Precautions: Fall Restrictions Weight Bearing Restrictions: No    Mobility  Bed Mobility Overal bed mobility: Modified Independent                Transfers Overall transfer level: Modified independent Equipment used: None                Ambulation/Gait Ambulation/Gait assistance: Modified independent (Device/Increase time) Ambulation Distance (Feet): 15 Feet Assistive device: None Gait Pattern/deviations: Step-through pattern;Decreased stride length     General Gait Details: pt moving well, but limited by HR which elevated to 178 nonsustained with simply walking around her bed.  pt without balance deficits and even manages her own O2 line.     Stairs            Wheelchair Mobility    Modified Rankin (Stroke Patients Only)        Balance Overall balance assessment: Modified Independent                                  Cognition Arousal/Alertness: Awake/alert Behavior During Therapy: WFL for tasks assessed/performed Overall Cognitive Status: Within Functional Limits for tasks assessed                      Exercises      General Comments        Pertinent Vitals/Pain Pain Assessment: No/denies pain    Home Living                      Prior Function            PT Goals (current goals can now be found in the care plan section) Acute Rehab PT Goals Patient Stated Goal: get home and feel better PT Goal Formulation: All assessment and education complete, DC therapy Progress towards PT goals: Goals met/education completed, patient discharged from PT    Frequency       PT Plan Other (comment) (D/C PT)    Co-evaluation             End of Session Equipment Utilized During Treatment: Oxygen Activity Tolerance: Treatment limited secondary to medical complications (Comment) (HR up to 178) Patient left: in bed;with call bell/phone within reach;with nursing/sitter in room     Time:  5396-7289 PT Time Calculation (min) (ACUTE ONLY): 25 min  Charges:  $Gait Training: 8-22 mins $Therapeutic Activity: 8-22 mins                    G CodesCatarina Hartshorn, [place] 11/19/2015, 10:39 AM

## 2015-11-19 NOTE — Progress Notes (Signed)
PROGRESS NOTE                                                                                                                                                                                                             Patient Demographics:    [name], is a 75 y.o. female, DOB - Jul 13, 1940, ZOX:[id number]  Admit date - 11/16/2015   Admitting Physician Briscoe Deutscher, MD  Outpatient Primary MD for the patient is [name], MD  LOS - 1  Chief Complaint  Patient presents with  . Palpitations  . Shortness of Breath       Brief Narrative   75 y.o. female with history significant for chronic atrial fibrillation on Eliquis,  hypothyroidism, coronary artery disease status post CABG, history of ischemic stroke, COPD, and chronic mid back pain who presents the emergency department for evaluation of palpitations, Workup significant for A. fib with RVR heart rate and 170s.   Subjective:    Jamari Diekman today has, No headache, No chest pain, No abdominal pain - No Nausea, Reports feeling of generalized weakness and fatigue.  Assessment  & Plan :    Principal Problem:   Atrial fibrillation with RVR (HCC) Active Problems:   2 Vessel CAD - s/p CABG; LIMA-LAD, SVG-RCA   Essential hypertension   Hypothyroidism   H/O ischemic left MCA stroke   Chronic atrial fibrillation (HCC)   Chronic anticoagulation   Hypokalemia   Microcytic anemia   Acute on chronic diastolic CHF (congestive heart failure) (HCC)   Chronic atrial fibrillation with RVR (HCC)   Malnutrition of moderate degree   Atrial fibrillation (HCC)  Chronic atrial fibrillation with RVR  -  a fib RVR with rate as high as 170's on admission - CHADS-VASc is ~69 (age x2, gender, CHF, hx of CVA x2, CAD, HTN) - Continue AC with Eliquis  - She is rate-controlled at home with low-dose Cardizem and Lopressor; had previously required higher doses and digoxin - Management per cardiology, on digoxin,  Metoprolol was increased by cardiology,  Cardizem been stopped, heart rate better controlled at rest, but heart rate uncontrolled on exertion, heart rate between 140s and 180s this a.m on exertion. where she required when necessary metoprolol.  Acute on chronic diastolic CHF   - Presents with SOB, elevated BNP, vascular congestion and effusions on CXR  -  TTE (04/06/15) with EF 50%, moderate LAE, moderate TR, severely elevated PA peak pressures - Suspect the current vol o/l is secondary to rapid rate, controlling that as above  - Diuresis per cardiology, transitioned from IV to oral Lasix - Daily BMP during diuresis  - SLIV, fluid-restrict diet, follow daily wts and I/O's  - Echo pending   Microcytic anemia  - Hgb 10.3 on admission with MCV 75.8  - Both indices appear stable and there is no evidence for active losses  - Workup significant for iron deficiency anemia, started on oral iron supplement,  folate level pending. - Most recent colonoscopy in 2016.  CAD - S/p CABG  - No anginal complaints  - Continue Lopressor, Crestor    Hypertension  - Blood pressure on the lower side, on beta blockers for heart rate control   Hypothyroidism  - T4 was mildly elevated with normal TSH in June 2017 and dose recently reduced from 75 mcg to 50 mcg - TSH 2.6, within normal limits, free T4 is 1.6, mildly elevated, will decrease Synthroid from 50 g to 37.5 g, will need repeat levels in 6 weeks.  Hypokalemia / hyperkalemia - repleted, today with hyperkalemia, will give one dose Kayexalate, will hold supplements, recheck in a.m.  Chronic respiratory failure - Patient reports she is on oxygen at home.  Non-severe (moderate) malnutrition -  in context of chronic illness, Underweight, seen by nutritionist  Code Status : DNR  Family Communication  : None at bedside  Disposition Plan  : home when stable,  Consults  : cardiology  Procedures  : none  DVT Prophylaxis  :  Eliquis  Lab  Results  Component Value Date   PLT 329 11/16/2015    Antibiotics  :    Anti-infectives    None        Objective:   Vitals:   11/18/15 0608 11/18/15 1324 11/18/15 2057 11/19/15 0449  BP: 95/60 115/68 103/64 108/61  Pulse: 74 90 90 85  Resp: 20 20 17 18   Temp: 97.7 F (36.5 C) 97.9 F (36.6 C) 97.9 F (36.6 C) 98 F (36.7 C)  TempSrc: Oral Oral Oral Oral  SpO2: 99% 100% 100% 100%  Weight:    36 kg (79 lb 5.9 oz)  Height:        Wt Readings from Last 3 Encounters:  11/19/15 36 kg (79 lb 5.9 oz)  09/23/15 38.7 kg (85 lb 6.4 oz)  09/13/15 40.9 kg (90 lb 3.2 oz)     Intake/Output Summary (Last 24 hours) at 11/19/15 1159 Last data filed at 11/19/15 1044  Gross per 24 hour  Intake             1257 ml  Output             2500 ml  Net            -1243 ml     Physical Exam  Awake Alert, Oriented X 3,  [place].AT,PERRAL Supple Neck,No JVD,   Symmetrical Chest wall movement, Good air movement bilaterally, CTAB Irr Irr, tachy,No Gallops,Rubs or new Murmurs, No Parasternal Heave +ve B.Sounds, Abd Soft, No tenderness, No rebound - guarding or rigidity. No Cyanosis, Clubbing or edema, No new Rash or bruise     Data Review:    CBC  Recent Labs Lab 11/14/15 1344 11/16/15 2102 11/17/15 0145  WBC 9.0 7.9  --   HGB 10.3* 10.3*  --   HCT 38.0 37.9 35.8  PLT 342  329  --   MCV 75.4* 75.8*  --   MCH 20.4* 20.6*  --   MCHC 27.1* 27.2*  --   RDW 18.1* 18.3*  --   LYMPHSABS 1.0  --   --   MONOABS 0.6  --   --   EOSABS 0.0  --   --   BASOSABS 0.0  --   --     Chemistries   Recent Labs Lab 11/14/15 1344 11/16/15 2102 11/17/15 0145 11/18/15 0239 11/18/15 1130 11/19/15 0302  NA 135 135  --  140  --  138  K 3.9 3.4*  --  5.6* 3.8 3.5  CL 98* 99*  --  98*  --  92*  CO2 28 28  --  36*  --  38*  GLUCOSE 121* 245*  --  87  --  71  BUN 19 13  --  13  --  11  CREATININE 0.91 0.98  --  1.05*  --  0.88  CALCIUM 8.5* 8.7*  --  8.8*  --  8.0*  MG  --   --  2.3  --    --   --    ------------------------------------------------------------------------------------------------------------------ No results for input(s): CHOL, HDL, LDLCALC, TRIG, CHOLHDL, LDLDIRECT in the last 72 hours.  Lab Results  Component Value Date   HGBA1C 5.9 (H) 04/06/2015   ------------------------------------------------------------------------------------------------------------------  Recent Labs  11/17/15 0145  TSH 2.619   ------------------------------------------------------------------------------------------------------------------  Recent Labs  11/17/15 0145  VITAMINB12 583  FERRITIN 10*  TIBC 456*  IRON 16*    Coagulation profile  Recent Labs Lab 11/16/15 2350  INR 3.04    No results for input(s): DDIMER in the last 72 hours.  Cardiac Enzymes  Recent Labs Lab 11/17/15 0145 11/17/15 0944 11/17/15 1327  TROPONINI <0.03 <0.03 <0.03   ------------------------------------------------------------------------------------------------------------------    Component Value Date/Time   BNP 460.0 (H) 11/16/2015 2350    Inpatient Medications  Scheduled Meds: . apixaban  5 mg Oral BID  . cholecalciferol  2,000 Units Oral Daily  . digoxin  0.125 mg Oral Daily  . feeding supplement  1 Container Oral TID BM  . ferrous sulfate  325 mg Oral Q breakfast  . furosemide  40 mg Oral BID  . levothyroxine  37.5 mcg Oral QAC breakfast  . lubiprostone  24 mcg Oral QPM  . metoprolol  50 mg Oral BID  . multivitamin  1 tablet Oral BID  . potassium chloride  20 mEq Oral Daily  . rosuvastatin  5 mg Oral QPM  . sodium chloride flush  3 mL Intravenous Q12H   Continuous Infusions:  PRN Meds:.sodium chloride, acetaminophen, metoprolol, ondansetron (ZOFRAN) IV, oxyCODONE-acetaminophen, sodium chloride flush  Micro Results No results found for this or any previous visit (from the past 240 hour(s)).  Radiology Reports Dg Chest 2 View  Result Date:  11/16/2015 CLINICAL DATA:  Atrial fibrillation and short of breath EXAM: CHEST  2 VIEW COMPARISON:  09/21/2015 FINDINGS: Postop CABG. Heart size mildly enlarged. Pulmonary vascular congestion slightly increased. Bilateral pleural effusions left greater than right similar to the prior study. Bibasilar atelectasis unchanged. Negative for pulmonary edema. IMPRESSION: Pulmonary vascular congestion with bilateral pleural effusions consistent with fluid overload. Bibasilar atelectasis left greater than right unchanged from the prior study. Electronically Signed   By: Marlan Palauharles  Clark M.D.   On: 11/16/2015 21:51     ELGERGAWY, DAWOOD M.D on 11/19/2015 at 11:59 AM  Between 7am to 7pm - Pager - [phone]  After  7pm go to [email or web address] - password [facility]  [facility] -  Office  [phone]

## 2015-11-19 NOTE — Progress Notes (Signed)
Patient's HR spiking from 140's to 180's.  MD made aware.  Prn metoprolol given at 0930.

## 2015-11-20 DIAGNOSIS — I2510 Atherosclerotic heart disease of native coronary artery without angina pectoris: Secondary | ICD-10-CM

## 2015-11-20 DIAGNOSIS — I5033 Acute on chronic diastolic (congestive) heart failure: Secondary | ICD-10-CM

## 2015-11-20 LAB — BASIC METABOLIC PANEL
ANION GAP: 10 (ref 5–15)
BUN: 8 mg/dL (ref 6–20)
CALCIUM: 8.6 mg/dL — AB (ref 8.9–10.3)
CO2: 40 mmol/L — ABNORMAL HIGH (ref 22–32)
Chloride: 85 mmol/L — ABNORMAL LOW (ref 101–111)
Creatinine, Ser: 0.9 mg/dL (ref 0.44–1.00)
GFR calc Af Amer: >60 mL/min (ref >60)
Glucose, Bld: 71 mg/dL (ref 65–99)
POTASSIUM: 3.3 mmol/L — AB (ref 3.5–5.1)
SODIUM: 135 mmol/L (ref 135–145)

## 2015-11-20 LAB — DIGOXIN LEVEL: DIGOXIN LVL: 0.9 ng/mL (ref 0.8–2.0)

## 2015-11-20 MED ORDER — POTASSIUM CHLORIDE CRYS ER 20 MEQ PO TBCR
20.0000 meq | EXTENDED_RELEASE_TABLET | Freq: Once | ORAL | Status: AC
  Administered 2015-11-20: 20 meq via ORAL
  Filled 2015-11-20: qty 1

## 2015-11-20 MED ORDER — METOPROLOL TARTRATE 50 MG PO TABS
50.0000 mg | ORAL_TABLET | Freq: Three times a day (TID) | ORAL | Status: DC
  Administered 2015-11-20 – 2015-11-21 (×3): 50 mg via ORAL
  Filled 2015-11-20 (×3): qty 1

## 2015-11-20 NOTE — Progress Notes (Signed)
.    Primary cardiologist: Dr. Bryan Lemmaavid Harding  Seen for followup: Atrial fibrillation, cardiomyopathy  Subjective:    No chest pain or shortness of breath at rest. Feels some palpitations when she gets up in the room to go to the bathroom.  Objective:   Temp:  [97.8 F (36.6 C)-98.7 F (37.1 C)] 98.7 F (37.1 C) (08/19 0416) Pulse Rate:  [80-98] 98 (08/19 1105) Resp:  [17-19] 18 (08/19 0416) BP: (99-111)/(60-71) 111/71 (08/19 1105) SpO2:  [100 %] 100 % (08/19 0416) Weight:  [77 lb 2.6 oz (35 kg)] 77 lb 2.6 oz (35 kg) (08/19 0416) Last BM Date: 11/15/15  Filed Weights   11/17/15 0308 11/19/15 0449 11/20/15 0416  Weight: 84 lb 6.4 oz (38.3 kg) 79 lb 5.9 oz (36 kg) 77 lb 2.6 oz (35 kg)    Intake/Output Summary (Last 24 hours) at 11/20/15 1215 Last data filed at 11/20/15 0417  Gross per 24 hour  Intake              120 ml  Output              900 ml  Net             -780 ml    Telemetry: Atrial fibrillation with intermittent RVR, some rates up into the 160s.  Exam:  General: Frail-appearing elderly woman in no distress.  Lungs: Decreased breath sounds.  Cardiac: Irregularly irregular without gallop.  Extremities: No pitting edema.  Lab Results:  Basic Metabolic Panel:  Recent Labs Lab 11/17/15 0145 11/18/15 0239 11/18/15 1130 11/19/15 0302 11/20/15 0251  NA  --  140  --  138 135  K  --  5.6* 3.8 3.5 3.3*  CL  --  98*  --  92* 85*  CO2  --  36*  --  38* 40*  GLUCOSE  --  87  --  71 71  BUN  --  13  --  11 8  CREATININE  --  1.05*  --  0.88 0.90  CALCIUM  --  8.8*  --  8.0* 8.6*  MG 2.3  --   --   --   --     CBC:  Recent Labs Lab 11/14/15 1344 11/16/15 2102 11/17/15 0145  WBC 9.0 7.9  --   HGB 10.3* 10.3*  --   HCT 38.0 37.9 35.8  MCV 75.4* 75.8*  --   PLT 342 329  --     Cardiac Enzymes:  Recent Labs Lab 11/17/15 0145 11/17/15 0944 11/17/15 1327  TROPONINI <0.03 <0.03 <0.03    Coagulation:  Recent Labs Lab 11/16/15 2350    INR 3.04   Digoxin level: 0.9  Echocardiogram 11/18/2015: Study Conclusions  - Left ventricle: The cavity size was normal. There was mild   concentric hypertrophy. Systolic function was mildly reduced. The   estimated ejection fraction was in the range of 45% to 50%.   Diffuse hypokinesis. The study is not technically sufficient to   allow evaluation of LV diastolic function. - Aortic valve: Transvalvular velocity was within the normal range.   There was no stenosis. There was no regurgitation. - Mitral valve: Calcified annulus. Mildly thickened leaflets .   Transvalvular velocity was within the normal range. There was no   evidence for stenosis. There was mild regurgitation. - Left atrium: The atrium was severely dilated. - Right ventricle: The cavity size was normal. Wall thickness was   normal. Systolic function was normal. - Atrial  septum: No defect or patent foramen ovale was identified   by color flow Doppler. - Tricuspid valve: There was moderate regurgitation. - Pulmonary arteries: Systolic pressure was severely increased. PA   peak pressure: 59 mm Hg (S). - Pericardium, extracardiac: There was a large left pleural   effusion.  Impressions:  - LVEF may be underestimated due to ventricular ectopy.   Medications:   Scheduled Medications: . apixaban  5 mg Oral BID  . cholecalciferol  2,000 Units Oral Daily  . digoxin  0.125 mg Oral Daily  . feeding supplement  1 Container Oral TID BM  . ferrous sulfate  325 mg Oral Q breakfast  . furosemide  40 mg Oral Daily  . levothyroxine  37.5 mcg Oral QAC breakfast  . lubiprostone  24 mcg Oral QPM  . metoprolol  75 mg Oral BID  . multivitamin  1 tablet Oral BID  . potassium chloride  20 mEq Oral Daily  . rosuvastatin  5 mg Oral QPM  . sodium chloride flush  3 mL Intravenous Q12H      PRN Medications:  sodium chloride, acetaminophen, metoprolol, ondansetron (ZOFRAN) IV, oxyCODONE-acetaminophen, sodium chloride  flush   Assessment:   1. Chronic atrial fibrillation with intermittent RVR. Medications have been adjusted with up titration of beta blocker and addition of Lanoxin. Heart rate trend is better but still suboptimal with ambulation. She is on Eliquis for stroke prophylaxis with CHADSVASC score of 8.  2. CAD status post CABG with LIMA to the LAD and SVG to the RCA. No active angina symptoms.  3. Acute on chronic combined heart failure with LVEF 45-50%. Continues on oral Lasix at this time. Out greater than in by 1200 cc last 24 hours.   Plan/Discussion:    Discussed with patient and daughter. Plan to change Lopressor to 50 mg 3 times a day dosing to spread this out better over 24 hours. Further up titration may be needed. Continue Lanoxin, level was normal. Possible discharge within the next 24 hours depending on heart rate control.   Jonelle SidleSamuel G. McDowell, M.D., F.A.C.C.

## 2015-11-20 NOTE — Progress Notes (Signed)
PROGRESS NOTE                                                                                                                                                                                                             Patient Demographics:    Brenda GuardianRobina Glass, is a 75 y.o. female, DOB - 11/28/1940, ZOX:[id number]  Admit date - 11/16/2015   Admitting Physician Briscoe Deutscherimothy S Opyd, MD  Outpatient Primary MD for the patient is Salli RealYun Sun, MD  LOS - 2  Chief Complaint  Patient presents with  . Palpitations  . Shortness of Breath       Brief Narrative   75 y.o. female with history significant for chronic atrial fibrillation on Eliquis,  hypothyroidism, coronary artery disease status post CABG, history of ischemic stroke, COPD, and chronic mid back pain who presents the emergency department for evaluation of palpitations, Workup significant for A. fib with RVR heart rate and 170s.   Subjective:    Roosevelt Letterman today has, No headache, No chest pain, No abdominal pain - No Nausea, Reports feeling of generalized weakness and fatigue.  Assessment  & Plan :    Principal Problem:   Atrial fibrillation with RVR (HCC) Active Problems:   2 Vessel CAD - s/p CABG; LIMA-LAD, SVG-RCA   Essential hypertension   Hypothyroidism   H/O ischemic left MCA stroke   Chronic atrial fibrillation (HCC)   Chronic anticoagulation   Hypokalemia   Microcytic anemia   Acute on chronic diastolic CHF (congestive heart failure) (HCC)   Chronic atrial fibrillation with RVR (HCC)   Malnutrition of moderate degree   Atrial fibrillation (HCC)  Chronic atrial fibrillation with RVR  -  a fib RVR with rate as high as 170's on admission - CHADS-VASc is 73~8 (age x2, gender, CHF, hx of CVA x2, CAD, HTN) - Continue AC with Eliquis  - She is rate-controlled at home with low-dose Cardizem and Lopressor; had previously required higher doses and digoxin - Management per cardiology, on digoxin,  Metoprolol ,  Cardizem been stopped,Dictation has been adjusted by cardiology, the plan was to change her Lopressor to 50 mg 3 times a day to spread it better over 24 hours.  Acute on chronic diastolic CHF   - Presents with SOB, elevated BNP, vascular congestion and effusions on CXR  - TTE (04/06/15) with  EF 50%, moderate LAE, moderate TR, severely elevated PA peak pressures - Suspect the current vol o/l is secondary to rapid rate, controlling that as above  - Diuresis per cardiology, transitioned from IV to oral Lasix - Daily BMP during diuresis  - SLIV, fluid-restrict diet, follow daily wts and I/O's  - Echo EF 45% with diffuse hypokinesis.   Microcytic anemia  - Hgb 10.3 on admission with MCV 75.8  - Both indices appear stable and there is no evidence for active losses  - Workup significant for iron deficiency anemia, started on oral iron supplement,  folate level pending. - Most recent colonoscopy in 2016.  CAD - S/p CABG  - No anginal complaints  - Continue Lopressor, Crestor    Hypertension  - Blood pressure on the lower side, on beta blockers for heart rate control   Hypothyroidism  - T4 was mildly elevated with normal TSH in June 2017 and dose recently reduced from 75 mcg to 50 mcg - TSH 2.6, within normal limits, free T4 is 1.6, mildly elevated, will decrease Synthroid from 50 g to 37.5 g, will need repeat levels in 6 weeks.  Hypokalemia / hyperkalemia - repleted.  Chronic respiratory failure - Patient reports she is on oxygen at home.  Non-severe (moderate) malnutrition -  in context of chronic illness, Underweight, seen by nutritionist  Code Status : DNR  Family Communication  : None at bedside  Disposition Plan  : home when stable,  Consults  : cardiology  Procedures  : none  DVT Prophylaxis  :  Eliquis  Lab Results  Component Value Date   PLT 329 11/16/2015    Antibiotics  :    Anti-infectives    None        Objective:   Vitals:    11/19/15 2126 11/20/15 0416 11/20/15 1105 11/20/15 1335  BP: 99/60 107/68 111/71 108/66  Pulse: 97 80 98 60  Resp: 17 18  16   Temp: 98.2 F (36.8 C) 98.7 F (37.1 C)  97.6 F (36.4 C)  TempSrc: Oral Oral  Oral  SpO2: 100% 100%  100%  Weight:  35 kg (77 lb 2.6 oz)    Height:        Wt Readings from Last 3 Encounters:  11/20/15 35 kg (77 lb 2.6 oz)  09/23/15 38.7 kg (85 lb 6.4 oz)  09/13/15 40.9 kg (90 lb 3.2 oz)     Intake/Output Summary (Last 24 hours) at 11/20/15 1523 Last data filed at 11/20/15 1400  Gross per 24 hour  Intake                0 ml  Output              400 ml  Net             -400 ml     Physical Exam  Awake Alert, Oriented X 3,  [place].AT,PERRAL Supple Neck,No JVD,   Symmetrical Chest wall movement, Good air movement bilaterally, CTAB Irr Irr, tachy,No Gallops,Rubs or new Murmurs, No Parasternal Heave +ve B.Sounds, Abd Soft, No tenderness, No rebound - guarding or rigidity. No Cyanosis, Clubbing or edema, No new Rash or bruise     Data Review:    CBC  Recent Labs Lab 11/14/15 1344 11/16/15 2102 11/17/15 0145  WBC 9.0 7.9  --   HGB 10.3* 10.3*  --   HCT 38.0 37.9 35.8  PLT 342 329  --   MCV 75.4* 75.8*  --  MCH 20.4* 20.6*  --   MCHC 27.1* 27.2*  --   RDW 18.1* 18.3*  --   LYMPHSABS 1.0  --   --   MONOABS 0.6  --   --   EOSABS 0.0  --   --   BASOSABS 0.0  --   --     Chemistries   Recent Labs Lab 11/14/15 1344 11/16/15 2102 11/17/15 0145 11/18/15 0239 11/18/15 1130 11/19/15 0302 11/20/15 0251  NA 135 135  --  140  --  138 135  K 3.9 3.4*  --  5.6* 3.8 3.5 3.3*  CL 98* 99*  --  98*  --  92* 85*  CO2 28 28  --  36*  --  38* 40*  GLUCOSE 121* 245*  --  87  --  71 71  BUN 19 13  --  13  --  11 8  CREATININE 0.91 0.98  --  1.05*  --  0.88 0.90  CALCIUM 8.5* 8.7*  --  8.8*  --  8.0* 8.6*  MG  --   --  2.3  --   --   --   --     ------------------------------------------------------------------------------------------------------------------ No results for input(s): CHOL, HDL, LDLCALC, TRIG, CHOLHDL, LDLDIRECT in the last 72 hours.  Lab Results  Component Value Date   HGBA1C 5.9 (H) 04/06/2015   ------------------------------------------------------------------------------------------------------------------ No results for input(s): TSH, T4TOTAL, T3FREE, THYROIDAB in the last 72 hours.  Invalid input(s): FREET3 ------------------------------------------------------------------------------------------------------------------ No results for input(s): VITAMINB12, FOLATE, FERRITIN, TIBC, IRON, RETICCTPCT in the last 72 hours.  Coagulation profile  Recent Labs Lab 11/16/15 2350  INR 3.04    No results for input(s): DDIMER in the last 72 hours.  Cardiac Enzymes  Recent Labs Lab 11/17/15 0145 11/17/15 0944 11/17/15 1327  TROPONINI <0.03 <0.03 <0.03   ------------------------------------------------------------------------------------------------------------------    Component Value Date/Time   BNP 460.0 (H) 11/16/2015 2350    Inpatient Medications  Scheduled Meds: . apixaban  5 mg Oral BID  . cholecalciferol  2,000 Units Oral Daily  . digoxin  0.125 mg Oral Daily  . feeding supplement  1 Container Oral TID BM  . ferrous sulfate  325 mg Oral Q breakfast  . furosemide  40 mg Oral Daily  . levothyroxine  37.5 mcg Oral QAC breakfast  . lubiprostone  24 mcg Oral QPM  . metoprolol  50 mg Oral TID  . multivitamin  1 tablet Oral BID  . potassium chloride  20 mEq Oral Daily  . rosuvastatin  5 mg Oral QPM  . sodium chloride flush  3 mL Intravenous Q12H   Continuous Infusions:  PRN Meds:.sodium chloride, acetaminophen, metoprolol, ondansetron (ZOFRAN) IV, oxyCODONE-acetaminophen, sodium chloride flush  Micro Results No results found for this or any previous visit (from the past 240  hour(s)).  Radiology Reports Dg Chest 2 View  Result Date: 11/16/2015 CLINICAL DATA:  Atrial fibrillation and short of breath EXAM: CHEST  2 VIEW COMPARISON:  09/21/2015 FINDINGS: Postop CABG. Heart size mildly enlarged. Pulmonary vascular congestion slightly increased. Bilateral pleural effusions left greater than right similar to the prior study. Bibasilar atelectasis unchanged. Negative for pulmonary edema. IMPRESSION: Pulmonary vascular congestion with bilateral pleural effusions consistent with fluid overload. Bibasilar atelectasis left greater than right unchanged from the prior study. Electronically Signed   By: Marlan Palau M.D.   On: 11/16/2015 21:51     Randol Kern, Velera Lansdale M.D on 11/20/2015 at 3:23 PM  Between 7am to 7pm - Pager - [phone]  After 7pm go to [email or web address] - password [facility]  [facility] -  Office  [phone]

## 2015-11-21 LAB — BASIC METABOLIC PANEL
ANION GAP: 13 (ref 5–15)
BUN: 11 mg/dL (ref 6–20)
CALCIUM: 9.5 mg/dL (ref 8.9–10.3)
CO2: 42 mmol/L — ABNORMAL HIGH (ref 22–32)
Chloride: 84 mmol/L — ABNORMAL LOW (ref 101–111)
Creatinine, Ser: 1.07 mg/dL — ABNORMAL HIGH (ref 0.44–1.00)
GFR, EST AFRICAN AMERICAN: 57 mL/min — AB (ref >60)
GFR, EST NON AFRICAN AMERICAN: 49 mL/min — AB (ref >60)
Glucose, Bld: 83 mg/dL (ref 65–99)
POTASSIUM: 3.1 mmol/L — AB (ref 3.5–5.1)
Sodium: 139 mmol/L (ref 135–145)

## 2015-11-21 LAB — MAGNESIUM: MAGNESIUM: 1.8 mg/dL (ref 1.7–2.4)

## 2015-11-21 MED ORDER — MAGNESIUM SULFATE IN D5W 1-5 GM/100ML-% IV SOLN
1.0000 g | Freq: Once | INTRAVENOUS | Status: AC
  Administered 2015-11-21: 1 g via INTRAVENOUS
  Filled 2015-11-21: qty 100

## 2015-11-21 MED ORDER — POTASSIUM CHLORIDE CRYS ER 20 MEQ PO TBCR
40.0000 meq | EXTENDED_RELEASE_TABLET | Freq: Once | ORAL | Status: AC
  Administered 2015-11-21: 40 meq via ORAL
  Filled 2015-11-21: qty 2

## 2015-11-21 MED ORDER — METOPROLOL TARTRATE 50 MG PO TABS
75.0000 mg | ORAL_TABLET | Freq: Three times a day (TID) | ORAL | Status: DC
  Administered 2015-11-21 – 2015-11-24 (×7): 75 mg via ORAL
  Filled 2015-11-21 (×8): qty 1

## 2015-11-21 MED ORDER — POTASSIUM CHLORIDE CRYS ER 20 MEQ PO TBCR
20.0000 meq | EXTENDED_RELEASE_TABLET | Freq: Once | ORAL | Status: DC
Start: 2015-11-21 — End: 2015-11-24

## 2015-11-21 NOTE — Progress Notes (Signed)
.    Primary cardiologist: Dr. Bryan Lemmaavid Harding  Seen for followup: Atrial fibrillation, cardiomyopathy  Subjective:    No dyspnea at rest or chest pain. Heart rate still not well-controlled with ambulation.  Objective:   Temp:  [97.4 F (36.3 C)-97.8 F (36.6 C)] 97.4 F (36.3 C) (08/20 0300) Pulse Rate:  [60-131] 131 (08/20 1100) Resp:  [16-17] 17 (08/20 0300) BP: (99-110)/(57-78) 110/59 (08/20 1100) SpO2:  [100 %] 100 % (08/20 0300) Weight:  [75 lb 4.8 oz (34.2 kg)] 75 lb 4.8 oz (34.2 kg) (08/20 0300) Last BM Date: 11/15/15  Filed Weights   11/19/15 0449 11/20/15 0416 11/21/15 0300  Weight: 79 lb 5.9 oz (36 kg) 77 lb 2.6 oz (35 kg) 75 lb 4.8 oz (34.2 kg)    Intake/Output Summary (Last 24 hours) at 11/21/15 1219 Last data filed at 11/21/15 1108  Gross per 24 hour  Intake              123 ml  Output              620 ml  Net             -497 ml    Telemetry: Atrial fibrillation with intermittent RVR.  Exam:  General: Frail-appearing elderly woman in no distress.  Lungs: Decreased breath sounds.  Cardiac: Irregularly irregular without gallop.  Extremities: No pitting edema.  Lab Results:  Basic Metabolic Panel:  Recent Labs Lab 11/17/15 0145  11/19/15 0302 11/20/15 0251 11/21/15 0312  NA  --   < > 138 135 139  K  --   < > 3.5 3.3* 3.1*  CL  --   < > 92* 85* 84*  CO2  --   < > 38* 40* 42*  GLUCOSE  --   < > 71 71 83  BUN  --   < > 11 8 11   CREATININE  --   < > 0.88 0.90 1.07*  CALCIUM  --   < > 8.0* 8.6* 9.5  MG 2.3  --   --   --  1.8  < > = values in this interval not displayed.  CBC:  Recent Labs Lab 11/14/15 1344 11/16/15 2102 11/17/15 0145  WBC 9.0 7.9  --   HGB 10.3* 10.3*  --   HCT 38.0 37.9 35.8  MCV 75.4* 75.8*  --   PLT 342 329  --     Cardiac Enzymes:  Recent Labs Lab 11/17/15 0145 11/17/15 0944 11/17/15 1327  TROPONINI <0.03 <0.03 <0.03    Coagulation:  Recent Labs Lab 11/16/15 2350  INR 3.04   Digoxin  level: 0.9  Echocardiogram 11/18/2015: Study Conclusions  - Left ventricle: The cavity size was normal. There was mild   concentric hypertrophy. Systolic function was mildly reduced. The   estimated ejection fraction was in the range of 45% to 50%.   Diffuse hypokinesis. The study is not technically sufficient to   allow evaluation of LV diastolic function. - Aortic valve: Transvalvular velocity was within the normal range.   There was no stenosis. There was no regurgitation. - Mitral valve: Calcified annulus. Mildly thickened leaflets .   Transvalvular velocity was within the normal range. There was no   evidence for stenosis. There was mild regurgitation. - Left atrium: The atrium was severely dilated. - Right ventricle: The cavity size was normal. Wall thickness was   normal. Systolic function was normal. - Atrial septum: No defect or patent foramen ovale was identified  by color flow Doppler. - Tricuspid valve: There was moderate regurgitation. - Pulmonary arteries: Systolic pressure was severely increased. PA   peak pressure: 59 mm Hg (S). - Pericardium, extracardiac: There was a large left pleural   effusion.  Impressions:  - LVEF may be underestimated due to ventricular ectopy.   Medications:   Scheduled Medications: . apixaban  5 mg Oral BID  . cholecalciferol  2,000 Units Oral Daily  . digoxin  0.125 mg Oral Daily  . feeding supplement  1 Container Oral TID BM  . ferrous sulfate  325 mg Oral Q breakfast  . furosemide  40 mg Oral Daily  . levothyroxine  37.5 mcg Oral QAC breakfast  . lubiprostone  24 mcg Oral QPM  . metoprolol  50 mg Oral TID  . multivitamin  1 tablet Oral BID  . potassium chloride  20 mEq Oral Daily  . rosuvastatin  5 mg Oral QPM  . sodium chloride flush  3 mL Intravenous Q12H     PRN Medications: sodium chloride, acetaminophen, metoprolol, ondansetron (ZOFRAN) IV, oxyCODONE-acetaminophen, sodium chloride flush   Assessment:   1.  Chronic atrial fibrillation with intermittent RVR. Medications have been adjusted with up titration of beta blocker and addition of Lanoxin. Heart rate trend is better but still suboptimal with ambulation. She is on Eliquis for stroke prophylaxis with CHADSVASC score of 8.  2. CAD status post CABG with LIMA to the LAD and SVG to the RCA. No active angina symptoms.  3. Acute on chronic combined heart failure with LVEF 45-50%. Continues on oral Lasix at this time. Out greater than in by 1200 cc last 24 hours.   Plan/Discussion:    Discussed with patient and daughter. Increase Lopressor to 75 mg 3 times a day. Further up titration may be needed. Continue Lanoxin, level was normal.  [name], M.D., F.A.C.C.

## 2015-11-21 NOTE — Progress Notes (Signed)
PROGRESS NOTE                                                                                                                                                                                                             Patient Demographics:    [name], is a 75 y.o. female, DOB - 1940/10/14, ZOX:[id number]  Admit date - 11/16/2015   Admitting Physician Briscoe Deutscher, MD  Outpatient Primary MD for the patient is [name], MD  LOS - 3  Chief Complaint  Patient presents with  . Palpitations  . Shortness of Breath       Brief Narrative   75 y.o. female with history significant for chronic atrial fibrillation on Eliquis,  hypothyroidism, coronary artery disease status post CABG, history of ischemic stroke, COPD, and chronic mid back pain who presents the emergency department for evaluation of palpitations, Workup significant for A. fib with RVR heart rate and 170s, Seen by cardiology, difficult to control RVR on by mouth medication, requires frequent titration especially in the setting of soft blood pressure.   Subjective:    [name] today has, No headache, No chest pain, No abdominal pain - No Nausea, Reports feeling of generalized weakness and fatigue.  Assessment  & Plan :    Principal Problem:   Atrial fibrillation with RVR (HCC) Active Problems:   2 Vessel CAD - s/p CABG; LIMA-LAD, SVG-RCA   Essential hypertension   Hypothyroidism   H/O ischemic left MCA stroke   Chronic atrial fibrillation (HCC)   Chronic anticoagulation   Hypokalemia   Microcytic anemia   Acute on chronic diastolic CHF (congestive heart failure) (HCC)   Chronic atrial fibrillation with RVR (HCC)   Malnutrition of moderate degree   Atrial fibrillation (HCC)  Chronic atrial fibrillation with RVR  -  a fib RVR with rate as high as 170's on admission - CHADS-VASc is ~22 (age x2, gender, CHF, hx of CVA x2, CAD, HTN) - Continue AC with Eliquis  - She is  rate-controlled at home with low-dose Cardizem and Lopressor; had previously required higher doses and digoxin - Management per cardiology, on digoxin, Metoprolol ,  Cardizem been stoppedMedication has been adjusted by cardiology, remains with poor heart rate control on Lopressor 50 mg 3 times daily, currently was increased to 75 mg 3 times a day, remains  on digoxin as well .   Acute on chronic diastolic CHF   - Presents with SOB, elevated BNP, vascular congestion and effusions on CXR  - TTE (04/06/15) with EF 50%, moderate LAE, moderate TR, severely elevated PA peak pressures - Suspect the current vol o/l is secondary to rapid rate, controlling that as above  - Diuresis per cardiology, transitioned from IV to oral Lasix - Daily BMP during diuresis  - SLIV, fluid-restrict diet, follow daily wts and I/O's  - Echo EF 45% with diffuse hypokinesis.   Microcytic anemia  - Hgb 10.3 on admission with MCV 75.8  - Both indices appear stable and there is no evidence for active losses  - Workup significant for iron deficiency anemia, started on oral iron supplement,  folate level pending. - Most recent colonoscopy in 2016.  CAD - S/p CABG  - No anginal complaints  - Continue Lopressor, Crestor    Hypertension  - Blood pressure on the lower side, on beta blockers for heart rate control   Hypothyroidism  - T4 was mildly elevated with normal TSH in June 2017 and dose recently reduced from 75 mcg to 50 mcg - TSH 2.6, within normal limits, free T4 is 1.6, mildly elevated, will decrease Synthroid from 50 g to 37.5 g, will need repeat levels in 6 weeks.  Hypokalemia / hyperkalemia - Potassium of 3.1 today, will repeat, will give 1 g of magnesium sulfate.  Chronic respiratory failure - Patient reports she is on oxygen at home.  Non-severe (moderate) malnutrition -  in context of chronic illness, Underweight, seen by nutritionist  Code Status : DNR  Family Communication  : None at  bedside  Disposition Plan  : home when stable,  Consults  : cardiology  Procedures  : none  DVT Prophylaxis  :  Eliquis  Lab Results  Component Value Date   PLT 329 11/16/2015    Antibiotics  :    Anti-infectives    None        Objective:   Vitals:   11/20/15 1711 11/20/15 1942 11/21/15 0300 11/21/15 1100  BP: 102/78 102/60 (!) 99/57 (!) 110/59  Pulse: (!) 103 87 79 (!) 131  Resp:  17 17   Temp:  97.8 F (36.6 C) 97.4 F (36.3 C)   TempSrc:  Oral Oral   SpO2:  100% 100%   Weight:   34.2 kg (75 lb 4.8 oz)   Height:        Wt Readings from Last 3 Encounters:  11/21/15 34.2 kg (75 lb 4.8 oz)  09/23/15 38.7 kg (85 lb 6.4 oz)  09/13/15 40.9 kg (90 lb 3.2 oz)     Intake/Output Summary (Last 24 hours) at 11/21/15 1358 Last data filed at 11/21/15 1108  Gross per 24 hour  Intake              123 ml  Output              620 ml  Net             -497 ml     Physical Exam  Awake Alert, Oriented X 3,  [place].AT,PERRAL Supple Neck,No JVD,   Symmetrical Chest wall movement, Good air movement bilaterally, CTAB Irr Irr, tachy,No Gallops,Rubs or new Murmurs, No Parasternal Heave +ve B.Sounds, Abd Soft, No tenderness, No rebound - guarding or rigidity. No Cyanosis, Clubbing or edema, No new Rash or bruise     Data Review:    CBC  Recent Labs Lab 11/16/15 2102 11/17/15 0145  WBC 7.9  --   HGB 10.3*  --   HCT 37.9 35.8  PLT 329  --   MCV 75.8*  --   MCH 20.6*  --   MCHC 27.2*  --   RDW 18.3*  --     Chemistries   Recent Labs Lab 11/16/15 2102 11/17/15 0145 11/18/15 0239 11/18/15 1130 11/19/15 0302 11/20/15 0251 11/21/15 0312  NA 135  --  140  --  138 135 139  K 3.4*  --  5.6* 3.8 3.5 3.3* 3.1*  CL 99*  --  98*  --  92* 85* 84*  CO2 28  --  36*  --  38* 40* 42*  GLUCOSE 245*  --  87  --  71 71 83  BUN 13  --  13  --  11 8 11   CREATININE 0.98  --  1.05*  --  0.88 0.90 1.07*  CALCIUM 8.7*  --  8.8*  --  8.0* 8.6* 9.5  MG  --  2.3  --   --   --    --  1.8   ------------------------------------------------------------------------------------------------------------------ No results for input(s): CHOL, HDL, LDLCALC, TRIG, CHOLHDL, LDLDIRECT in the last 72 hours.  Lab Results  Component Value Date   HGBA1C 5.9 (H) 04/06/2015   ------------------------------------------------------------------------------------------------------------------ No results for input(s): TSH, T4TOTAL, T3FREE, THYROIDAB in the last 72 hours.  Invalid input(s): FREET3 ------------------------------------------------------------------------------------------------------------------ No results for input(s): VITAMINB12, FOLATE, FERRITIN, TIBC, IRON, RETICCTPCT in the last 72 hours.  Coagulation profile  Recent Labs Lab 11/16/15 2350  INR 3.04    No results for input(s): DDIMER in the last 72 hours.  Cardiac Enzymes  Recent Labs Lab 11/17/15 0145 11/17/15 0944 11/17/15 1327  TROPONINI <0.03 <0.03 <0.03   ------------------------------------------------------------------------------------------------------------------    Component Value Date/Time   BNP 460.0 (H) 11/16/2015 2350    Inpatient Medications  Scheduled Meds: . apixaban  5 mg Oral BID  . cholecalciferol  2,000 Units Oral Daily  . digoxin  0.125 mg Oral Daily  . feeding supplement  1 Container Oral TID BM  . ferrous sulfate  325 mg Oral Q breakfast  . furosemide  40 mg Oral Daily  . levothyroxine  37.5 mcg Oral QAC breakfast  . lubiprostone  24 mcg Oral QPM  . magnesium sulfate 1 - 4 g bolus IVPB  1 g Intravenous Once  . metoprolol  75 mg Oral TID  . multivitamin  1 tablet Oral BID  . potassium chloride  20 mEq Oral Daily  . potassium chloride  20 mEq Oral Once  . potassium chloride  40 mEq Oral Once  . rosuvastatin  5 mg Oral QPM  . sodium chloride flush  3 mL Intravenous Q12H   Continuous Infusions:  PRN Meds:.sodium chloride, acetaminophen, metoprolol, ondansetron  (ZOFRAN) IV, oxyCODONE-acetaminophen, sodium chloride flush  Micro Results No results found for this or any previous visit (from the past 240 hour(s)).  Radiology Reports Dg Chest 2 View  Result Date: 11/16/2015 CLINICAL DATA:  Atrial fibrillation and short of breath EXAM: CHEST  2 VIEW COMPARISON:  09/21/2015 FINDINGS: Postop CABG. Heart size mildly enlarged. Pulmonary vascular congestion slightly increased. Bilateral pleural effusions left greater than right similar to the prior study. Bibasilar atelectasis unchanged. Negative for pulmonary edema. IMPRESSION: Pulmonary vascular congestion with bilateral pleural effusions consistent with fluid overload. Bibasilar atelectasis left greater than right unchanged from the prior study. Electronically Signed   By: Leonette Mostharles  Chestine Sporelark M.D.   On: 11/16/2015 21:51     Cason Dabney M.D on 11/21/2015 at 1:58 PM  Between 7am to 7pm - Pager - [phone]  After 7pm go to [email or web address] - password [email or web address]  [facility] -  Office  [phone]

## 2015-11-22 DIAGNOSIS — E440 Moderate protein-calorie malnutrition: Secondary | ICD-10-CM

## 2015-11-22 DIAGNOSIS — I495 Sick sinus syndrome: Secondary | ICD-10-CM

## 2015-11-22 DIAGNOSIS — Z7901 Long term (current) use of anticoagulants: Secondary | ICD-10-CM

## 2015-11-22 LAB — BASIC METABOLIC PANEL
ANION GAP: 8 (ref 5–15)
BUN: 12 mg/dL (ref 6–20)
CALCIUM: 9.6 mg/dL (ref 8.9–10.3)
CO2: 42 mmol/L — ABNORMAL HIGH (ref 22–32)
CREATININE: 0.93 mg/dL (ref 0.44–1.00)
Chloride: 86 mmol/L — ABNORMAL LOW (ref 101–111)
GFR calc Af Amer: >60 mL/min (ref >60)
GFR, EST NON AFRICAN AMERICAN: 58 mL/min — AB (ref >60)
GLUCOSE: 88 mg/dL (ref 65–99)
Potassium: 4.7 mmol/L (ref 3.5–5.1)
Sodium: 136 mmol/L (ref 135–145)

## 2015-11-22 MED ORDER — SENNOSIDES-DOCUSATE SODIUM 8.6-50 MG PO TABS
1.0000 | ORAL_TABLET | Freq: Two times a day (BID) | ORAL | Status: DC
  Administered 2015-11-22 – 2015-11-24 (×3): 1 via ORAL
  Filled 2015-11-22 (×4): qty 1

## 2015-11-22 MED ORDER — POLYETHYLENE GLYCOL 3350 17 G PO PACK
34.0000 g | PACK | Freq: Two times a day (BID) | ORAL | Status: AC
  Administered 2015-11-22: 17 g via ORAL
  Administered 2015-11-22: 34 g via ORAL
  Filled 2015-11-22 (×2): qty 2

## 2015-11-22 NOTE — Consult Note (Signed)
ELECTROPHYSIOLOGY CONSULT NOTE    Patient ID: MAREA REASNER MRN: [id number], DOB/AGE: [birth year] 75 y.o.  Admit date: 11/16/2015 Date of Consult: 11/22/2015  Primary Physician: [name], MD Primary Cardiologist: Herbie Baltimore  Reason for Consultation: atrial fibrillation   HPI:  Malaiya Paczkowski Cayabyab is a 75 y.o. female with a past medical history significant for CAD s/p CABG, PVD, prior CVA, COPD, pulmonary hypertension, hypothyroidism and permanent atrial fibrillation with difficult to control ventricular rate. She was previously on amiodarone and doing very well with rate control but this was discontinued and her rate control has been adjusted several times since then.  Per my conversation with Dr Herbie Baltimore this morning, she had previous amio and dig toxicity.  She was admitted this time with recurrent tachypalpitations and a feeling like she couldn't catch her breath. She has had several admissions in the last few months for tachypalpitations needing increased rate control.  Unfortunately, rate control has been limited by bradycardia as well as hypotension. She was seen by Dr Ladona Ridgel in June of this year at which time PPM implant was deferred.  EP has been asked to evaluate again for treatment options.  She currently reports that she is feeling better. She has chronic shortness of breath on exertion and fatigue but no chest pain. She feels her heart racing when she gets up to walk. Telemetry has demonstrated atrial fibrillation with rates 40's-140's this admission.  She has not had syncope, pre-syncope or dizziness. No recent fevers or chills.   Past Medical History:  Diagnosis Date  . Anxiety   . CAD (coronary artery disease), native coronary artery 2009   75% LAD, diffuse 75-90% RCA --> Referred for CABG + MAZE;  Cardiolite 12/2013: No ischemia or Infarction  . Cervical neck pain with evidence of disc disease    with need for surgery -- November 2015  . Chronic low back pain    scoliosis & lordosis  .  CKD (chronic kidney disease)   . COPD (chronic obstructive pulmonary disease) (HCC)   . Dyslipidemia, goal LDL below 70    On Crestor, followed by PCP  . Essential hypertension   . H/O ischemic left MCA stroke 11/18/2014   MRI/MRA of head: Multifocal left MCA infarct.. Also possible left ACA territory infarct -- complete occlusion of left MCA distally at M2 level. Marked left ACA attenuation.  . H/O: pneumonia   . Hypothyroidism   . Ischemic cardiomyopathy 2012   EF ~40-45% by Echo  . Osteoarthritis of back    And neck, hands,spine  . PAD (peripheral artery disease) (HCC) 2007   s/p L Ileac A stent ; most recent Dopplers July 2012: Less than 50% reduction bilaterally. ABI 0.96 on the right 0.88 on left;;;12/27/2011   -ABI right .87 and left ABI .78  ,LEFT CIA and EIA stent normall patency, left CFA,SFA,and popliteal 0-49%; rgt proximal SFA 50-69%,rgt CIA,EIA, and CFA 0-49%  . Persistent atrial fibrillation (HCC) 04/05/2012   Now permanent A. fib s/p MAZE -- recurrence, cardioversion-converted to sinus bradycardia --> Now persistent  . Pulmonary hypertension (HCC)    Estimated PA pressure on Echo January 2017 = 59 mmHg with dilated IVC, severely dilated RA and moderate TR.  . S/P CABG x 2 2009   LIMA-LAD, SVG-RCA, with AV fistula ligation and Maze procedure  . Stroke (HCC)   . TIA (transient ischemic attack) 04/05/2015     Surgical History:  Past Surgical History:  Procedure Laterality Date  . ABDOMINAL HYSTERECTOMY    .  ANTERIOR CERVICAL DECOMP/DISCECTOMY FUSION N/A 01/23/2014   Procedure: ANTERIOR CERVICAL DECOMPRESSION/DISCECTOMY FUSION CERVICAL FOUR-FIVE,CERVICAL FIVE-SIX,CERVICAL SIX-SEVEN;  Surgeon: Mariam DollarGary P Cram, MD;  Location: [facility];  Service: Neurosurgery;  Laterality: N/A;  . CARDIAC CATHETERIZATION  07/30/2007   75% LAD ,3 stenoses of 75-90% in  RCA;circumflex from proximal RCA with no lesion seen,normal ramus intermediate branh; normal LV systoilc function  .  CARDIOVERSION  04/05/2012   Procedure: CARDIOVERSION;  Surgeon: Thurmon FairMihai Croitoru, MD;  Location: [facility];  Service: Cardiovascular;  Laterality: N/A;  . COLONOSCOPY WITH PROPOFOL N/A 11/12/2014   Procedure: COLONOSCOPY WITH PROPOFOL;  Surgeon: Charna ElizabethJyothi Mann, MD;  Location: [facility];  Service: Endoscopy;  Laterality: N/A;  . CORONARY ARTERY BYPASS GRAFT  2009   LIMA-LAD, SVG-RCA (also MAZE & AV fistula ligation)  . ILIAC ARTERY STENT Left 04/06/2005   Ex Iliac - CFA (Smart STENTS -- 7x4 in EIA, 6 x 3 CFA)   . MAZE  2009   along with CABG  . NM CARDIOLITE LTD  12/2013   Non-gated for Afib; No Ischemia or Infarction.  Marland Kitchen. NM MYOVIEW LTD  May 2013   No ischemia or Infarct  . TRANSTHORACIC ECHOCARDIOGRAM  Jan 2012   EF ~40-45%, global HK; PAP ~45-50 mmHg  . TRANSTHORACIC ECHOCARDIOGRAM  Jan 2017   EF ~50%. No RWMA, Afib. Paradoxical Septal motion. no DD assessment. Mod LA dilation.  PAP ~59 mmHg & dilated IVC. Mod TR. Severe RA dilation.     Prescriptions Prior to Admission  Medication Sig Dispense Refill Last Dose  . AMITIZA 24 MCG capsule Take 24 mcg by mouth every evening.    11/16/2015 at Unknown time  . apixaban (ELIQUIS) 5 MG TABS tablet Take 1 tablet (5 mg total) by mouth 2 (two) times daily. 60 tablet 0 11/16/2015 at 1800  . Cholecalciferol (VITAMIN D3) 2000 units TABS Take 1 tablet by mouth daily.   11/16/2015 at Unknown time  . diltiazem (CARDIZEM CD) 120 MG 24 hr capsule Take 1 capsule (120 mg total) by mouth daily. NEED OV. 30 capsule 2 11/16/2015 at Unknown time  . furosemide (LASIX) 20 MG tablet Take 1 tablet (20 mg total) by mouth every other day. 30 tablet 0 11/16/2015 at Unknown time  . levothyroxine (SYNTHROID, LEVOTHROID) 50 MCG tablet Take 1 tablet (50 mcg total) by mouth daily before breakfast. 30 tablet 0 11/16/2015 at Unknown time  . metoprolol (LOPRESSOR) 50 MG tablet TAKE 0.5 TABLETS (25 MG TOTAL) BY MOUTH 2 (TWO) TIMES DAILY. 30 tablet 4 11/16/2015 at 1800  . Multiple  Vitamins-Minerals (PRESERVISION AREDS 2) CAPS Take 1 capsule by mouth 2 (two) times daily.   11/16/2015 at Unknown time  . oxyCODONE-acetaminophen (PERCOCET/ROXICET) 5-325 MG tablet Take 1 tablet by mouth every 6 (six) hours as needed for moderate pain.    11/16/2015 at Unknown time  . rosuvastatin (CRESTOR) 5 MG tablet TAKE 1 TABLET BY MOUTH IN THE EVENING 30 tablet 1 11/16/2015 at Unknown time  . feeding supplement (BOOST / RESOURCE BREEZE) LIQD Take 1 Container by mouth 3 (three) times daily between meals. (Patient not taking: Reported on 11/17/2015) 90 Container 0 Not Taking at Unknown time  . senna-docusate (SENOKOT-S) 8.6-50 MG tablet Take 1 tablet by mouth at bedtime as needed for mild constipation. (Patient not taking: Reported on 11/17/2015) 30 tablet 0 Not Taking at Unknown time    Inpatient Medications:  . apixaban  5 mg Oral BID  . cholecalciferol  2,000 Units Oral Daily  .  digoxin  0.125 mg Oral Daily  . feeding supplement  1 Container Oral TID BM  . ferrous sulfate  325 mg Oral Q breakfast  . furosemide  40 mg Oral Daily  . levothyroxine  37.5 mcg Oral QAC breakfast  . lubiprostone  24 mcg Oral QPM  . metoprolol  75 mg Oral TID  . multivitamin  1 tablet Oral BID  . polyethylene glycol  34 g Oral BID  . potassium chloride  20 mEq Oral Daily  . potassium chloride  20 mEq Oral Once  . rosuvastatin  5 mg Oral QPM  . senna-docusate  1 tablet Oral BID  . sodium chloride flush  3 mL Intravenous Q12H    Allergies: No Known Allergies  Social History   Social History  . Marital status: Widowed    Spouse name: N/A  . Number of children: N/A  . Years of education: N/A   Occupational History  . Not on file.   Social History Main Topics  . Smoking status: Former Smoker    Packs/day: 0.50    Types: Cigarettes    Quit date: 01/07/1998  . Smokeless tobacco: Never Used  . Alcohol use No  . Drug use: No  . Sexual activity: Not on file   Other Topics Concern  . Not on file    Social History Narrative    She is a divorced mother of 1 and one child who died. Her daughter is here with her   today. She has got 4 grandchildren. She is a native of [place] and only has an occasional alcoholic   beverage. She does not get routine exercise mostly because of her spine pain. She did previously smoke but quit many years ago.      Family History  Problem Relation Age of Onset  . Heart attack Mother     Late 2270s  . Stroke Mother   . Stroke Father   . Heart attack Father     3180s  . Heart disease Brother 60    Cardiomegaly  . Stroke Sister   . Heart attack Son 2238     Review of Systems: All other systems reviewed and are otherwise negative except as noted above.  Physical Exam: Vitals:   11/21/15 1500 11/21/15 2122 11/22/15 0332 11/22/15 1310  BP: (!) 100/50 (!) 89/46 (!) 93/50 (!) 104/52  Pulse: (!) 122 60 68 (!) 58  Resp: 18 18 18 18   Temp: 98.4 F (36.9 C) 97.5 F (36.4 C) 97.6 F (36.4 C) 98.4 F (36.9 C)  TempSrc: Oral Oral Oral Oral  SpO2: (!) 2% 100% 98% 92%  Weight:   76 lb 9.6 oz (34.7 kg)   Height:        GEN- The patient is elderly, frail, and chronically ill appearing, alert and oriented x 3 today.   HEENT: normocephalic, atraumatic; sclera clear, conjunctiva pink; hearing intact; oropharynx clear; neck supple  Lungs- Clear to ausculation bilaterally, normal work of breathing.  No wheezes, rales, rhonchi Heart- Irregular rate and rhythm  GI- soft, non-tender, non-distended, bowel sounds present  Extremities- no clubbing, cyanosis, or edema  MS- no significant deformity or atrophy Skin- warm and dry, no rash or lesion Psych- euthymic mood, full affect Neuro- strength and sensation are intact  Labs:   Lab Results  Component Value Date   WBC 7.9 11/16/2015   HGB 10.3 (L) 11/16/2015   HCT 35.8 11/17/2015   MCV 75.8 (L) 11/16/2015   PLT 329 11/16/2015  Recent Labs Lab 11/22/15 0513  NA 136  K 4.7  CL 86*  CO2 42*  BUN 12   CREATININE 0.93  CALCIUM 9.6  GLUCOSE 88      Radiology/Studies: Dg Chest 2 View Result Date: 11/16/2015 CLINICAL DATA:  Atrial fibrillation and short of breath EXAM: CHEST  2 VIEW COMPARISON:  09/21/2015 FINDINGS: Postop CABG. Heart size mildly enlarged. Pulmonary vascular congestion slightly increased. Bilateral pleural effusions left greater than right similar to the prior study. Bibasilar atelectasis unchanged. Negative for pulmonary edema. IMPRESSION: Pulmonary vascular congestion with bilateral pleural effusions consistent with fluid overload. Bibasilar atelectasis left greater than right unchanged from the prior study. Electronically Signed   By: Marlan Palau M.D.   On: 11/16/2015 21:51    ZOX:WRUEAV fibrillation, v rate 142  TELEMETRY: atrial fibrillation, v rates 30-140's  Assessment/Plan: 1.  Permanent atrial fibrillation with tachy/brady syndrome The patient has had recurrent admissions for AF with RVR. Medical therapy is limited by bradycardia and hypotension. She has had previous amio and digoxin toxicity.  Options are severely limited.  Her comorbidities and weight make any procedure significantly higher risk.  We have discussed the possibility of a pacemaker today and the fact that she is at increased risk.  She would like to speak further with Dr Ladona Ridgel.  Continue Eliquis long term for CHADS2VASC of at least 7   2. CAD s/p CABG No recent ischemic symptoms Continue medical therapy   3.  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  Failure to thrive May benefit from palliative care consult to discuss goals of care   Signed, Gypsy Balsam, NP 11/22/2015 3:17 PM    EP attending  Patient seen and examined. I've reviewed the findings as noted above by Magdalene Molly, nurse practitioner. The patient is well known to me. She is a pleasant 75 year old woman with coronary disease, chronic atrial fibrillation, and  pulmonary hypertension. The patient has been failing to thrive for several months and has had multiple hospitalizations for rapid atrial fibrillation. Previously she also had bradycardia with very slow heart rates limiting her choice of medical therapy. The patient presented again with additional rapid atrial fibrillation and acute on chronic systolic and diastolic heart failure. She is improved but her ventricular rates have remained elevated, about 100. Previously, she had developed severe bradycardia. Her exam demonstrates a frail 75 year old woman in no acute distress. Lungs revealed scattered rales in the bases. Cardiovascular examination irregular rhythm with tachycardia. There is a soft systolic murmur. Extremities demonstrated no edema. Telemetry demonstrates atrial fibrillation with a rapid ventricular response. Labs and chest x-ray were reviewed.  Assessment 1. Atrial fibrillation with both bradycardia and tachycardia - I discussed treatment options with the patient and her daughter in detail. The risks, goals, benefits, and expectations of permanent pacemaker insertion have been reviewed. I would not recommend AV node ablation at this time, although we would hope to control her ventricular rate by up titration of her medical therapy. 2. Coronary artery disease - the patient has no anginal symptoms. She is status post bypass surgery. 3. Prior stroke - she will continue systemic anticoagulation. 4. Peripheral vascular disease - the patient has minimal claudication. She is not particularly active and is relatively sedentary.  Lewayne Bunting, M.D.

## 2015-11-22 NOTE — Progress Notes (Signed)
Patient Name: Brenda LoganRobina B Turri Date of Encounter: 11/22/2015     Principal Problem:   Atrial fibrillation with RVR (HCC) Active Problems:   2 Vessel CAD - s/p CABG; LIMA-LAD, SVG-RCA   Essential hypertension   Hypothyroidism   H/O ischemic left MCA stroke   Chronic atrial fibrillation (HCC)   Chronic anticoagulation   Hypokalemia   Microcytic anemia   Acute on chronic diastolic CHF (congestive heart failure) (HCC)   Chronic atrial fibrillation with RVR (HCC)   Malnutrition of moderate degree   Atrial fibrillation (HCC)    SUBJECTIVE  Feeling okay. Frustrated with being in hospital so long. Discussed that EP will see him  CURRENT MEDS . apixaban  5 mg Oral BID  . cholecalciferol  2,000 Units Oral Daily  . digoxin  0.125 mg Oral Daily  . feeding supplement  1 Container Oral TID BM  . ferrous sulfate  325 mg Oral Q breakfast  . furosemide  40 mg Oral Daily  . levothyroxine  37.5 mcg Oral QAC breakfast  . lubiprostone  24 mcg Oral QPM  . metoprolol  75 mg Oral TID  . multivitamin  1 tablet Oral BID  . potassium chloride  20 mEq Oral Daily  . potassium chloride  20 mEq Oral Once  . rosuvastatin  5 mg Oral QPM  . sodium chloride flush  3 mL Intravenous Q12H    OBJECTIVE  Vitals:   11/21/15 1100 11/21/15 1500 11/21/15 2122 11/22/15 0332  BP: (!) 110/59 (!) 100/50 (!) 89/46 (!) 93/50  Pulse: (!) 131 (!) 122 60 68  Resp:  18 18 18   Temp:  98.4 F (36.9 C) 97.5 F (36.4 C) 97.6 F (36.4 C)  TempSrc:  Oral Oral Oral  SpO2:  (!) 2% 100% 98%  Weight:    76 lb 9.6 oz (34.7 kg)  Height:        Intake/Output Summary (Last 24 hours) at 11/22/15 0826 Last data filed at 11/22/15 0700  Gross per 24 hour  Intake              373 ml  Output              303 ml  Net               70 ml   Filed Weights   11/20/15 0416 11/21/15 0300 11/22/15 0332  Weight: 77 lb 2.6 oz (35 kg) 75 lb 4.8 oz (34.2 kg) 76 lb 9.6 oz (34.7 kg)    PHYSICAL EXAM  General: Pleasant,  NAD. Neuro: Alert and oriented X 3. Moves all extremities spontaneously. Psych: Normal affect. HEENT:  Normal  Neck: Supple without bruits or JVD. Lungs:  Resp regular and unlabored, CTA. Heart: irreg irreg  no s3, s4, or murmurs. Abdomen: Soft, non-tender, non-distended, BS + x 4.  Extremities: No clubbing, cyanosis or edema. DP/PT/Radials 2+ and equal bilaterally.  Accessory Clinical Findings  CBC No results for input(s): WBC, NEUTROABS, HGB, HCT, MCV, PLT in the last 72 hours. Basic Metabolic Panel  Recent Labs  11/21/15 0312 11/22/15 0513  NA 139 136  K 3.1* 4.7  CL 84* 86*  CO2 42* 42*  GLUCOSE 83 88  BUN 11 12  CREATININE 1.07* 0.93  CALCIUM 9.5 9.6  MG 1.8  --      TELE   afib with rapid ventricular response with activity (HR 150s) and bradycardia with ~2.3 second pauses at night.  Radiology/Studies  Dg Chest 2 View  Result Date: 11/16/2015 CLINICAL DATA:  Atrial fibrillation and short of breath EXAM: CHEST  2 VIEW COMPARISON:  09/21/2015 FINDINGS: Postop CABG. Heart size mildly enlarged. Pulmonary vascular congestion slightly increased. Bilateral pleural effusions left greater than right similar to the prior study. Bibasilar atelectasis unchanged. Negative for pulmonary edema. IMPRESSION: Pulmonary vascular congestion with bilateral pleural effusions consistent with fluid overload. Bibasilar atelectasis left greater than right unchanged from the prior study. Electronically Signed   By: Marlan Palauharles  Clark M.D.   On: 11/16/2015 21:51   2D ECHO: 11/18/2015 LV EF: 45- 50% Study Conclusions - Left ventricle: The cavity size was normal. There was mild concentric hypertrophy. Systolic function was mildly reduced. The estimated ejection fraction was in the range of 45% to 50%. Diffuse hypokinesis. The study is not technically sufficient to allow evaluation of LV diastolic function. - Aortic valve: Transvalvular velocity was within the normal range. There was no  stenosis. There was no regurgitation. - Mitral valve: Calcified annulus. Mildly thickened leaflets . Transvalvular velocity was within the normal range. There was no evidence for stenosis. There was mild regurgitation. - Left atrium: The atrium was severely dilated. - Right ventricle: The cavity size was normal. Wall thickness was normal. Systolic function was normal. - Atrial septum: No defect or patent foramen ovale was identified by color flow Doppler. - Tricuspid valve: There was moderate regurgitation. - Pulmonary arteries: Systolic pressure was severely increased. PA peak pressure: 59 mm Hg (S). - Pericardium, extracardiac: There was a large left pleural effusion. Impressions: - LVEF may be underestimated due to ventricular ectopy.   ASSESSMENT AND PLAN  [name] is a 75 year old female with a past medical history of CAD s/p CABG in 2009, chronic atrial fibrillation (on Eliquis), Hx of CVA, COPD, HTN, ischemic cardiomyopathy (EF 45-50%) and CKD who presented to the ED on 11/16/15 with palpitations and SOB and found to be in afib with RVR HR 140s.  Atrial fibrillation with RVR/tachy-brady: continue anticoagulation with apixaban for CHADSVASC score of 8.  Metoprolol increased to 50 mg TID. Restarted back on oral digoxin at low dose 0.125 mg daily. Digoxin level yesterday was normal. She has not tolerated amio in the past, has a history of digoxin toxicity and hypotension limits titration of AV nodal blocking agents. She continues to have afib with rapid ventricular response with activity and bradycardia with ~2.3 second pauses at night. For this reason, we will have EP consult for possible AV node ablation and pacemaker placement.  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 Volume appears improved after IV lasix 40mg  x2. Net -2.83 L. She was resumed on oral lasix  40mg  BID and continues to do well on this. Dilt was discontinued in order to further titrate beta-blocker.   History of CAD s/p CABG: Had nonischemic nuclear study in 2015, denies chest pain. Continue statin and beta blocker. No ASA due to need for NOAC.  HTN: BPs have been soft.   NSVT: 9 beat run.   BB  Signed, Cline CrockKathryn Adith Tejada PA-C  Pager [phone]

## 2015-11-22 NOTE — Care Management Important Message (Signed)
Important Message  Patient Details  Name: [name] MRN: [id number] Date of Birth: 11/01/1940   Medicare Important Message Given:  Yes    [name] [name] 11/22/2015, 11:08 AM

## 2015-11-22 NOTE — Progress Notes (Signed)
PROGRESS NOTE                                                                                                                                                                                                             Patient Demographics:    [name], is a 75 y.o. female, DOB - 1940-05-22, WUJ:[id number]  Admit date - 11/16/2015   Admitting Physician Briscoe Deutscher, MD  Outpatient Primary MD for the patient is [name], MD  LOS - 4  Chief Complaint  Patient presents with  . Palpitations  . Shortness of Breath       Brief Narrative   75 y.o. female with history significant for chronic atrial fibrillation on Eliquis,  hypothyroidism, coronary artery disease status post CABG, history of ischemic stroke, COPD, and chronic mid back pain who presents the emergency department for evaluation of palpitations, Workup significant for A. fib with RVR heart rate and 170s, Seen by cardiology, difficult to control RVR on by mouth medication, requires frequent titration especially in the setting of soft blood pressure.   Subjective:    [name] today has, No headache, No chest pain, No abdominal pain - No Nausea, Reports feeling of generalized weakness and fatigue.  Assessment  & Plan :    Principal Problem:   Atrial fibrillation with RVR (HCC) Active Problems:   2 Vessel CAD - s/p CABG; LIMA-LAD, SVG-RCA   Essential hypertension   Hypothyroidism   H/O ischemic left MCA stroke   Chronic atrial fibrillation (HCC)   Chronic anticoagulation   Hypokalemia   Microcytic anemia   Acute on chronic diastolic CHF (congestive heart failure) (HCC)   Chronic atrial fibrillation with RVR (HCC)   Malnutrition of moderate degree   Atrial fibrillation (HCC)  Chronic atrial fibrillation with RVR  -  a fib RVR with rate as high as 170's on admission - CHADS-VASc is ~33 (age x2, gender, CHF, hx of CVA x2, CAD, HTN) - Continue AC with Eliquis  - She is  rate-controlled at home with low-dose Cardizem and Lopressor; had previously required higher doses and digoxin - Management per cardiology, on digoxin, Metoprolol ,  Cardizem been stoppedMedication has been adjusted by cardiology, remains with poor heart rate control, despite multiple adjustments of AV blocking agents, with 2 points reports overnight, and urology will consult EP for  possible AV node ablation and pacemaker placement .Marland Kitchen.   Acute on chronic diastolic CHF   - Presents with SOB, elevated BNP, vascular congestion and effusions on CXR  - TTE (04/06/15) with EF 50%, moderate LAE, moderate TR, severely elevated PA peak pressures - Suspect the current vol o/l is secondary to rapid rate, controlling that as above  - Diuresis per cardiology, transitioned from IV to oral Lasix - Daily BMP during diuresis  - SLIV, fluid-restrict diet, follow daily wts and I/O's  - Echo EF 45% with diffuse hypokinesis.   Microcytic anemia  - Hgb 10.3 on admission with MCV 75.8  - Both indices appear stable and there is no evidence for active losses  - Workup significant for iron deficiency anemia, started on oral iron supplement,  folate level pending. - Most recent colonoscopy in 2016.  CAD - S/p CABG  - No anginal complaints  - Continue Lopressor, Crestor    Hypertension  - Blood pressure on the lower side, on beta blockers for heart rate control   Hypothyroidism  - T4 was mildly elevated with normal TSH in June 2017 and dose recently reduced from 75 mcg to 50 mcg - TSH 2.6, within normal limits, free T4 is 1.6, mildly elevated, will decrease Synthroid from 50 g to 37.5 g, will need repeat levels in 6 weeks.  Hypokalemia / hyperkalemia - Repleted  Chronic respiratory failure - Patient reports she is on oxygen at home.  Non-severe (moderate) malnutrition -  in context of chronic illness, Underweight, seen by nutritionist  Code Status : DNR  Family Communication  : None at  bedside  Disposition Plan  : home when stable,  Consults  : cardiology  Procedures  : none  DVT Prophylaxis  :  Eliquis  Lab Results  Component Value Date   PLT 329 11/16/2015    Antibiotics  :    Anti-infectives    None        Objective:   Vitals:   11/21/15 1100 11/21/15 1500 11/21/15 2122 11/22/15 0332  BP: (!) 110/59 (!) 100/50 (!) 89/46 (!) 93/50  Pulse: (!) 131 (!) 122 60 68  Resp:  18 18 18   Temp:  98.4 F (36.9 C) 97.5 F (36.4 C) 97.6 F (36.4 C)  TempSrc:  Oral Oral Oral  SpO2:  (!) 2% 100% 98%  Weight:    34.7 kg (76 lb 9.6 oz)  Height:        Wt Readings from Last 3 Encounters:  11/22/15 34.7 kg (76 lb 9.6 oz)  09/23/15 38.7 kg (85 lb 6.4 oz)  09/13/15 40.9 kg (90 lb 3.2 oz)     Intake/Output Summary (Last 24 hours) at 11/22/15 1233 Last data filed at 11/22/15 1000  Gross per 24 hour  Intake              370 ml  Output              803 ml  Net             -433 ml     Physical Exam  Awake Alert, Oriented X 3,  [place].AT,PERRAL Supple Neck,No JVD,   Symmetrical Chest wall movement, Good air movement bilaterally, CTAB Irr Irr, tachy,No Gallops,Rubs or new Murmurs, No Parasternal Heave +ve B.Sounds, Abd Soft, No tenderness, No rebound - guarding or rigidity. No Cyanosis, Clubbing or edema, No new Rash or bruise     Data Review:    CBC  Recent Labs Lab  11/16/15 2102 11/17/15 0145  WBC 7.9  --   HGB 10.3*  --   HCT 37.9 35.8  PLT 329  --   MCV 75.8*  --   MCH 20.6*  --   MCHC 27.2*  --   RDW 18.3*  --     Chemistries   Recent Labs Lab 11/17/15 0145 11/18/15 0239 11/18/15 1130 11/19/15 0302 11/20/15 0251 11/21/15 0312 11/22/15 0513  NA  --  140  --  138 135 139 136  K  --  5.6* 3.8 3.5 3.3* 3.1* 4.7  CL  --  98*  --  92* 85* 84* 86*  CO2  --  36*  --  38* 40* 42* 42*  GLUCOSE  --  87  --  71 71 83 88  BUN  --  13  --  11 8 11 12   CREATININE  --  1.05*  --  0.88 0.90 1.07* 0.93  CALCIUM  --  8.8*  --  8.0* 8.6* 9.5  9.6  MG 2.3  --   --   --   --  1.8  --    ------------------------------------------------------------------------------------------------------------------ No results for input(s): CHOL, HDL, LDLCALC, TRIG, CHOLHDL, LDLDIRECT in the last 72 hours.  Lab Results  Component Value Date   HGBA1C 5.9 (H) 04/06/2015   ------------------------------------------------------------------------------------------------------------------ No results for input(s): TSH, T4TOTAL, T3FREE, THYROIDAB in the last 72 hours.  Invalid input(s): FREET3 ------------------------------------------------------------------------------------------------------------------ No results for input(s): VITAMINB12, FOLATE, FERRITIN, TIBC, IRON, RETICCTPCT in the last 72 hours.  Coagulation profile  Recent Labs Lab 11/16/15 2350  INR 3.04    No results for input(s): DDIMER in the last 72 hours.  Cardiac Enzymes  Recent Labs Lab 11/17/15 0145 11/17/15 0944 11/17/15 1327  TROPONINI <0.03 <0.03 <0.03   ------------------------------------------------------------------------------------------------------------------    Component Value Date/Time   BNP 460.0 (H) 11/16/2015 2350    Inpatient Medications  Scheduled Meds: . apixaban  5 mg Oral BID  . cholecalciferol  2,000 Units Oral Daily  . digoxin  0.125 mg Oral Daily  . feeding supplement  1 Container Oral TID BM  . ferrous sulfate  325 mg Oral Q breakfast  . furosemide  40 mg Oral Daily  . levothyroxine  37.5 mcg Oral QAC breakfast  . lubiprostone  24 mcg Oral QPM  . metoprolol  75 mg Oral TID  . multivitamin  1 tablet Oral BID  . polyethylene glycol  34 g Oral BID  . potassium chloride  20 mEq Oral Daily  . potassium chloride  20 mEq Oral Once  . rosuvastatin  5 mg Oral QPM  . senna-docusate  1 tablet Oral BID  . sodium chloride flush  3 mL Intravenous Q12H   Continuous Infusions:  PRN Meds:.sodium chloride, acetaminophen, metoprolol,  ondansetron (ZOFRAN) IV, oxyCODONE-acetaminophen, sodium chloride flush  Micro Results No results found for this or any previous visit (from the past 240 hour(s)).  Radiology Reports Dg Chest 2 View  Result Date: 11/16/2015 CLINICAL DATA:  Atrial fibrillation and short of breath EXAM: CHEST  2 VIEW COMPARISON:  09/21/2015 FINDINGS: Postop CABG. Heart size mildly enlarged. Pulmonary vascular congestion slightly increased. Bilateral pleural effusions left greater than right similar to the prior study. Bibasilar atelectasis unchanged. Negative for pulmonary edema. IMPRESSION: Pulmonary vascular congestion with bilateral pleural effusions consistent with fluid overload. Bibasilar atelectasis left greater than right unchanged from the prior study. Electronically Signed   By: Marlan Palau M.D.   On: 11/16/2015 21:51  Randol KernELGERGAWY, Treyvon Blahut M.D on 11/22/2015 at 12:33 PM  Between 7am to 7pm - Pager - [phone]  After 7pm go to [email or web address] - password [email or web address]  [facility] -  Office  [phone]

## 2015-11-22 NOTE — Progress Notes (Signed)
Pt refusing ambulation at this time due to frequency/urgency to urinate.  Also refusing to sit up in recliner due to back pain.

## 2015-11-23 ENCOUNTER — Encounter (HOSPITAL_COMMUNITY)
Admission: EM | Disposition: A | Discharge status: Home / Self Care | Payer: Self-pay | Source: Home / Self Care | Attending: Internal Medicine

## 2015-11-23 DIAGNOSIS — I482 Chronic atrial fibrillation: Secondary | ICD-10-CM

## 2015-11-23 DIAGNOSIS — Z8673 Personal history of transient ischemic attack (TIA), and cerebral infarction without residual deficits: Secondary | ICD-10-CM

## 2015-11-23 DIAGNOSIS — I440 Atrioventricular block, first degree: Secondary | ICD-10-CM

## 2015-11-23 HISTORY — PX: EP IMPLANTABLE DEVICE: SHX172B

## 2015-11-23 LAB — BASIC METABOLIC PANEL
Anion gap: 6 (ref 5–15)
BUN: 14 mg/dL (ref 6–20)
CO2: 39 mmol/L — ABNORMAL HIGH (ref 22–32)
Calcium: 8.9 mg/dL (ref 8.9–10.3)
Chloride: 89 mmol/L — ABNORMAL LOW (ref 101–111)
Creatinine, Ser: 0.82 mg/dL (ref 0.44–1.00)
GFR calc Af Amer: >60 mL/min (ref >60)
GLUCOSE: 92 mg/dL (ref 65–99)
POTASSIUM: 4 mmol/L (ref 3.5–5.1)
Sodium: 134 mmol/L — ABNORMAL LOW (ref 135–145)

## 2015-11-23 LAB — SURGICAL PCR SCREEN
MRSA, PCR: NEGATIVE
Staphylococcus aureus: NEGATIVE

## 2015-11-23 LAB — CBC
HCT: 40.1 % (ref 36.0–46.0)
Hemoglobin: 11.1 g/dL — ABNORMAL LOW (ref 12.0–15.0)
MCH: 20.6 pg — AB (ref 26.0–34.0)
MCHC: 27.7 g/dL — AB (ref 30.0–36.0)
MCV: 74.4 fL — AB (ref 78.0–100.0)
PLATELETS: 186 10*3/uL (ref 150–400)
RBC: 5.39 MIL/uL — AB (ref 3.87–5.11)
RDW: 18.9 % — AB (ref 11.5–15.5)
WBC: 8.2 10*3/uL (ref 4.0–10.5)

## 2015-11-23 SURGERY — PACEMAKER IMPLANT
Anesthesia: LOCAL

## 2015-11-23 MED ORDER — ONDANSETRON HCL 4 MG/2ML IJ SOLN: 4.0000 mg | Freq: Four times a day (QID) | INTRAMUSCULAR | Status: DC | PRN

## 2015-11-23 MED ORDER — MIDAZOLAM HCL 5 MG/5ML IJ SOLN
INTRAMUSCULAR | Status: AC
  Filled 2015-11-23: qty 5

## 2015-11-23 MED ORDER — LIDOCAINE HCL (PF) 1 % IJ SOLN
INTRAMUSCULAR | Status: AC
  Filled 2015-11-23: qty 60

## 2015-11-23 MED ORDER — LIDOCAINE HCL (PF) 1 % IJ SOLN
INTRAMUSCULAR | Status: DC | PRN
  Administered 2015-11-23: 31 mL via INTRADERMAL

## 2015-11-23 MED ORDER — OXYCODONE-ACETAMINOPHEN 5-325 MG PO TABS
1.0000 | ORAL_TABLET | Freq: Four times a day (QID) | ORAL | Status: DC
  Administered 2015-11-23 – 2015-11-24 (×4): 2 via ORAL
  Filled 2015-11-23 (×4): qty 2

## 2015-11-23 MED ORDER — CHLORHEXIDINE GLUCONATE 4 % EX LIQD: 60.0000 mL | Freq: Once | CUTANEOUS | Status: AC

## 2015-11-23 MED ORDER — CEFAZOLIN SODIUM-DEXTROSE 2-4 GM/100ML-% IV SOLN
INTRAVENOUS | Status: AC
  Filled 2015-11-23: qty 100

## 2015-11-23 MED ORDER — HEPARIN (PORCINE) IN NACL 2-0.9 UNIT/ML-% IJ SOLN
INTRAMUSCULAR | Status: AC
  Filled 2015-11-23: qty 500

## 2015-11-23 MED ORDER — CEFAZOLIN SODIUM-DEXTROSE 2-4 GM/100ML-% IV SOLN
2.0000 g | INTRAVENOUS | Status: AC
  Administered 2015-11-23: 2 g via INTRAVENOUS
  Filled 2015-11-23: qty 100

## 2015-11-23 MED ORDER — SODIUM CHLORIDE 0.9 % IV SOLN
INTRAVENOUS | Status: DC
  Administered 2015-11-23: 11:00:00 via INTRAVENOUS

## 2015-11-23 MED ORDER — CEFAZOLIN IN D5W 1 GM/50ML IV SOLN
1.0000 g | Freq: Three times a day (TID) | INTRAVENOUS | Status: DC
  Administered 2015-11-23 – 2015-11-24 (×2): 1 g via INTRAVENOUS
  Filled 2015-11-23 (×4): qty 50

## 2015-11-23 MED ORDER — IOPAMIDOL (ISOVUE-370) INJECTION 76%
INTRAVENOUS | Status: DC | PRN
  Administered 2015-11-23: 10 mL via INTRAVENOUS

## 2015-11-23 MED ORDER — IOPAMIDOL (ISOVUE-370) INJECTION 76%
INTRAVENOUS | Status: AC
  Filled 2015-11-23: qty 50

## 2015-11-23 MED ORDER — CEFAZOLIN IN D5W 1 GM/50ML IV SOLN
1.0000 g | Freq: Four times a day (QID) | INTRAVENOUS | Status: DC
  Filled 2015-11-23 (×2): qty 50

## 2015-11-23 MED ORDER — APIXABAN 5 MG PO TABS
5.0000 mg | ORAL_TABLET | Freq: Two times a day (BID) | ORAL | Status: DC
  Administered 2015-11-23 – 2015-11-24 (×2): 5 mg via ORAL
  Filled 2015-11-23 (×2): qty 1

## 2015-11-23 MED ORDER — HEPARIN (PORCINE) IN NACL 2-0.9 UNIT/ML-% IJ SOLN
INTRAMUSCULAR | Status: DC | PRN
  Administered 2015-11-23: 17:00:00

## 2015-11-23 MED ORDER — ACETAMINOPHEN 325 MG PO TABS: 325.0000 mg | ORAL_TABLET | ORAL | Status: DC | PRN

## 2015-11-23 MED ORDER — SODIUM CHLORIDE 0.9 % IR SOLN
80.0000 mg | Status: AC
  Administered 2015-11-23: 80 mg
  Filled 2015-11-23: qty 2

## 2015-11-23 MED ORDER — MIDAZOLAM HCL 5 MG/5ML IJ SOLN
INTRAMUSCULAR | Status: DC | PRN
  Administered 2015-11-23: 1 mg via INTRAVENOUS

## 2015-11-23 MED ORDER — SODIUM CHLORIDE 0.9 % IV SOLN
INTRAVENOUS | Status: DC | PRN
  Administered 2015-11-23: 250 mL via INTRAVENOUS

## 2015-11-23 MED ORDER — SODIUM CHLORIDE 0.9 % IR SOLN
Status: AC
  Filled 2015-11-23: qty 2

## 2015-11-23 MED ORDER — FENTANYL CITRATE (PF) 100 MCG/2ML IJ SOLN
INTRAMUSCULAR | Status: AC
  Filled 2015-11-23: qty 2

## 2015-11-23 MED ORDER — CHLORHEXIDINE GLUCONATE 4 % EX LIQD
60.0000 mL | Freq: Once | CUTANEOUS | Status: AC
  Administered 2015-11-23: 4 via TOPICAL
  Filled 2015-11-23: qty 60

## 2015-11-23 SURGICAL SUPPLY — 7 items
CABLE SURGICAL S-101-97-12 (CABLE) ×1 IMPLANT
GUIDEWIRE ANGLED .035X150CM (WIRE) ×1 IMPLANT
LEAD CAPSURE NOVUS 5076-52CM (Lead) ×1 IMPLANT
PAD DEFIB LIFELINK (PAD) ×1 IMPLANT
PPM SENSIA SR SESR01 (Pacemaker) ×1 IMPLANT
SHEATH CLASSIC 7F (SHEATH) ×1 IMPLANT
TRAY PACEMAKER INSERTION (PACKS) ×1 IMPLANT

## 2015-11-23 NOTE — Progress Notes (Signed)
PROGRESS NOTE                                                                                                                                                                                                             Patient Demographics:    Brenda GuardianRobina Glass, is a 75 y.o. female, DOB - 08/28/1940, [id number]  Admit date - 11/16/2015   Admitting Physician Briscoe Deutscherimothy S Opyd, MD  Outpatient Primary MD for the patient is Salli RealYun Sun, MD  LOS - 5  Chief Complaint  Patient presents with  . Palpitations  . Shortness of Breath       Brief Narrative   75 y.o. female with history significant for chronic atrial fibrillation on Eliquis,  hypothyroidism, coronary artery disease status post CABG, history of ischemic stroke, COPD, and chronic mid back pain who presents the emergency department for evaluation of palpitations, Workup significant for A. fib with RVR heart rate and 170s, Seen by cardiology, difficult to control RVR on by mouth medication, requires frequent titration especially in the setting of soft blood pressure.   Subjective:    Elanah Gassert today has, No headache, No chest pain, No abdominal pain - No Nausea, Reports feeling of generalized weakness and fatigue.  Assessment  & Plan :    Principal Problem:   Atrial fibrillation with RVR (HCC) Active Problems:   2 Vessel CAD - s/p CABG; LIMA-LAD, SVG-RCA   Essential hypertension   Hypothyroidism   H/O ischemic left MCA stroke   Chronic atrial fibrillation (HCC)   Chronic anticoagulation   Hypokalemia   Microcytic anemia   Acute on chronic diastolic CHF (congestive heart failure) (HCC)   Chronic atrial fibrillation with RVR (HCC)   Malnutrition of moderate degree   Atrial fibrillation (HCC)   Tachy-brady syndrome (HCC)  Chronic atrial fibrillation with RVR  -  a fib RVR with rate as high as 170's on admission - CHADS-VASc is 45~8 (age x2, gender, CHF, hx of CVA x2, CAD, HTN) - Continue AC with  Eliquis  - She is rate-controlled at home with low-dose Cardizem and Lopressor; had previously required higher doses and digoxin - Management per cardiology, on digoxin, Metoprolol ,  Cardizem been stoppedMedication has been adjusted by cardiology, remains with poor heart rate control, despite multiple adjustments of AV blocking agents, seen by EP, and plan for  permanent pacemaker insertion today.  Acute on chronic diastolic CHF   - Presents with SOB, elevated BNP, vascular congestion and effusions on CXR  - TTE (04/06/15) with EF 50%, moderate LAE, moderate TR, severely elevated PA peak pressures - Suspect the current vol o/l is secondary to rapid rate, controlling that as above  - Diuresis per cardiology, transitioned from IV to oral Lasix - Daily BMP during diuresis  - SLIV, fluid-restrict diet, follow daily wts and I/O's , -3,613 ml since admission - Echo EF 45% with diffuse hypokinesis.   Microcytic anemia  - Hgb 10.3 on admission with MCV 75.8  - Both indices appear stable and there is no evidence for active losses  - Workup significant for iron deficiency anemia, started on oral iron supplement,  folate level pending. - Most recent colonoscopy in 2016.  CAD - S/p CABG  - No anginal complaints  - Continue Lopressor, Crestor    Hypertension  - Blood pressure on the lower side, on beta blockers for heart rate control   Hypothyroidism  - T4 was mildly elevated with normal TSH in June 2017 and dose recently reduced from 75 mcg to 50 mcg - TSH 2.6, within normal limits, free T4 is 1.6, mildly elevated, will decrease Synthroid from 50 g to 37.5 g, will need repeat levels in 6 weeks.  Hypokalemia / hyperkalemia - Repleted  Chronic respiratory failure - Patient reports she is on oxygen at home.  Non-severe (moderate) malnutrition -  in context of chronic illness, Underweight, seen by nutritionist  Code Status : DNR  Family Communication  : Discussed with the  patient  Disposition Plan  : home when stable,  Consults  : cardiology  Procedures  : none  DVT Prophylaxis  :  Eliquis  Lab Results  Component Value Date   PLT 186 11/23/2015    Antibiotics  :    Anti-infectives    Start     Dose/Rate Route Frequency Ordered Stop   11/23/15 0800  gentamicin (GARAMYCIN) 80 mg in sodium chloride irrigation 0.9 % 500 mL irrigation     80 mg Irrigation To Cath Lab 11/23/15 0744 11/24/15 0800   11/23/15 0800  ceFAZolin (ANCEF) IVPB 2g/100 mL premix     2 g 200 mL/hr over 30 Minutes Intravenous To Cath Lab 11/23/15 0744 11/24/15 0800        Objective:   Vitals:   11/22/15 1950 11/22/15 2130 11/23/15 0046 11/23/15 0448  BP: 114/60 (!) 93/50 (!) 95/54 100/63  Pulse: (!) 132  76 82  Resp: 18   18  Temp: 97.6 F (36.4 C)   97.5 F (36.4 C)  TempSrc:    Oral  SpO2: 100%  100% 100%  Weight:    34.5 kg (76 lb 1.6 oz)  Height:        Wt Readings from Last 3 Encounters:  11/23/15 34.5 kg (76 lb 1.6 oz)  09/23/15 38.7 kg (85 lb 6.4 oz)  09/13/15 40.9 kg (90 lb 3.2 oz)     Intake/Output Summary (Last 24 hours) at 11/23/15 1039 Last data filed at 11/22/15 2357  Gross per 24 hour  Intake              520 ml  Output              800 ml  Net             -280 ml     Physical Exam  Awake Alert, Oriented  X 3,  [place].AT,PERRAL Supple Neck,No JVD,   Symmetrical Chest wall movement, Good air movement bilaterally, CTAB Irr Irr, tachy,No Gallops,Rubs or new Murmurs, No Parasternal Heave +ve B.Sounds, Abd Soft, No tenderness, No rebound - guarding or rigidity. No Cyanosis, Clubbing or edema, No new Rash or bruise     Data Review:    CBC  Recent Labs Lab 11/16/15 2102 11/17/15 0145 11/23/15 0241  WBC 7.9  --  8.2  HGB 10.3*  --  11.1*  HCT 37.9 35.8 40.1  PLT 329  --  186  MCV 75.8*  --  74.4*  MCH 20.6*  --  20.6*  MCHC 27.2*  --  27.7*  RDW 18.3*  --  18.9*    Chemistries   Recent Labs Lab 11/17/15 0145  11/19/15 0302  11/20/15 0251 11/21/15 0312 11/22/15 0513 11/23/15 0241  NA  --   < > 138 135 139 136 134*  K  --   < > 3.5 3.3* 3.1* 4.7 4.0  CL  --   < > 92* 85* 84* 86* 89*  CO2  --   < > 38* 40* 42* 42* 39*  GLUCOSE  --   < > 71 71 83 88 92  BUN  --   < > 11 8 11 12 14   CREATININE  --   < > 0.88 0.90 1.07* 0.93 0.82  CALCIUM  --   < > 8.0* 8.6* 9.5 9.6 8.9  MG 2.3  --   --   --  1.8  --   --   < > = values in this interval not displayed. ------------------------------------------------------------------------------------------------------------------ No results for input(s): CHOL, HDL, LDLCALC, TRIG, CHOLHDL, LDLDIRECT in the last 72 hours.  Lab Results  Component Value Date   HGBA1C 5.9 (H) 04/06/2015   ------------------------------------------------------------------------------------------------------------------ No results for input(s): TSH, T4TOTAL, T3FREE, THYROIDAB in the last 72 hours.  Invalid input(s): FREET3 ------------------------------------------------------------------------------------------------------------------ No results for input(s): VITAMINB12, FOLATE, FERRITIN, TIBC, IRON, RETICCTPCT in the last 72 hours.  Coagulation profile  Recent Labs Lab 11/16/15 2350  INR 3.04    No results for input(s): DDIMER in the last 72 hours.  Cardiac Enzymes  Recent Labs Lab 11/17/15 0145 11/17/15 0944 11/17/15 1327  TROPONINI <0.03 <0.03 <0.03   ------------------------------------------------------------------------------------------------------------------    Component Value Date/Time   BNP 460.0 (H) 11/16/2015 2350    Inpatient Medications  Scheduled Meds: . apixaban  5 mg Oral BID  .  ceFAZolin (ANCEF) IV  2 g Intravenous To Cath  . chlorhexidine  60 mL Topical Once  . chlorhexidine  60 mL Topical Once  . cholecalciferol  2,000 Units Oral Daily  . digoxin  0.125 mg Oral Daily  . feeding supplement  1 Container Oral TID BM  . ferrous sulfate  325 mg Oral  Q breakfast  . furosemide  40 mg Oral Daily  . gentamicin irrigation  80 mg Irrigation To Cath  . levothyroxine  37.5 mcg Oral QAC breakfast  . lubiprostone  24 mcg Oral QPM  . metoprolol  75 mg Oral TID  . multivitamin  1 tablet Oral BID  . oxyCODONE-acetaminophen  1-2 tablet Oral Q6H  . potassium chloride  20 mEq Oral Daily  . potassium chloride  20 mEq Oral Once  . rosuvastatin  5 mg Oral QPM  . senna-docusate  1 tablet Oral BID  . sodium chloride flush  3 mL Intravenous Q12H   Continuous Infusions: . sodium chloride     PRN Meds:.sodium  chloride, acetaminophen, metoprolol, ondansetron (ZOFRAN) IV, sodium chloride flush  Micro Results No results found for this or any previous visit (from the past 240 hour(s)).  Radiology Reports Dg Chest 2 View  Result Date: 11/16/2015 CLINICAL DATA:  Atrial fibrillation and short of breath EXAM: CHEST  2 VIEW COMPARISON:  09/21/2015 FINDINGS: Postop CABG. Heart size mildly enlarged. Pulmonary vascular congestion slightly increased. Bilateral pleural effusions left greater than right similar to the prior study. Bibasilar atelectasis unchanged. Negative for pulmonary edema. IMPRESSION: Pulmonary vascular congestion with bilateral pleural effusions consistent with fluid overload. Bibasilar atelectasis left greater than right unchanged from the prior study. Electronically Signed   By: Marlan Palauharles  Clark M.D.   On: 11/16/2015 21:51     Johnny Latu M.D on 11/23/2015 at 10:39 AM  Between 7am to 7pm - Pager - [phone]  After 7pm go to [email or web address] - password [email or web address]  [facility] -  Office  [phone]

## 2015-11-23 NOTE — Progress Notes (Signed)
Patient Name: [name] Date of Encounter: 11/23/2015     Principal Problem:   Atrial fibrillation with RVR (HCC) Active Problems:   2 Vessel CAD - s/p CABG; LIMA-LAD, SVG-RCA   Essential hypertension   Hypothyroidism   H/O ischemic left MCA stroke   Chronic atrial fibrillation (HCC)   Chronic anticoagulation   Hypokalemia   Microcytic anemia   Acute on chronic diastolic CHF (congestive heart failure) (HCC)   Chronic atrial fibrillation with RVR (HCC)   Malnutrition of moderate degree   Atrial fibrillation (HCC)   Tachy-brady syndrome (HCC)    SUBJECTIVE  Waiting for pacemaker procedure today. No complaints.   CURRENT MEDS . apixaban  5 mg Oral BID  .  ceFAZolin (ANCEF) IV  2 g Intravenous To Cath  . chlorhexidine  60 mL Topical Once  . chlorhexidine  60 mL Topical Once  . cholecalciferol  2,000 Units Oral Daily  . digoxin  0.125 mg Oral Daily  . feeding supplement  1 Container Oral TID BM  . ferrous sulfate  325 mg Oral Q breakfast  . furosemide  40 mg Oral Daily  . gentamicin irrigation  80 mg Irrigation To Cath  . levothyroxine  37.5 mcg Oral QAC breakfast  . lubiprostone  24 mcg Oral QPM  . metoprolol  75 mg Oral TID  . multivitamin  1 tablet Oral BID  . oxyCODONE-acetaminophen  1-2 tablet Oral Q6H  . potassium chloride  20 mEq Oral Daily  . potassium chloride  20 mEq Oral Once  . rosuvastatin  5 mg Oral QPM  . senna-docusate  1 tablet Oral BID  . sodium chloride flush  3 mL Intravenous Q12H    OBJECTIVE  Vitals:   11/22/15 1950 11/22/15 2130 11/23/15 0046 11/23/15 0448  BP: 114/60 (!) 93/50 (!) 95/54 100/63  Pulse: (!) 132  76 82  Resp: 18   18  Temp: 97.6 F (36.4 C)   97.5 F (36.4 C)  TempSrc:    Oral  SpO2: 100%  100% 100%  Weight:    76 lb 1.6 oz (34.5 kg)  Height:        Intake/Output Summary (Last 24 hours) at 11/23/15 1006 Last data filed at 11/22/15 2357  Gross per 24 hour  Intake              520 ml  Output              800  ml  Net             -280 ml   Filed Weights   11/21/15 0300 11/22/15 0332 11/23/15 0448  Weight: 75 lb 4.8 oz (34.2 kg) 76 lb 9.6 oz (34.7 kg) 76 lb 1.6 oz (34.5 kg)    PHYSICAL EXAM  General: Pleasant, NAD. Neuro: Alert and oriented X 3. Moves all extremities spontaneously. Psych: Normal affect. HEENT:  Normal  Neck: Supple without bruits or JVD. Lungs:  Resp regular and unlabored, CTA. Heart: irreg irreg  no s3, s4, or murmurs. Abdomen: Soft, non-tender, non-distended, BS + x 4.  Extremities: No clubbing, cyanosis or edema. DP/PT/Radials 2+ and equal bilaterally.  Accessory Clinical Findings  CBC  Recent Labs  11/23/15 0241  WBC 8.2  HGB 11.1*  HCT 40.1  MCV 74.4*  PLT 186   Basic Metabolic Panel  Recent Labs  11/21/15 0312 11/22/15 0513 11/23/15 0241  NA 139 136 134*  K 3.1* 4.7 4.0  CL 84* 86* 89*  CO2 42* 42* 39*  GLUCOSE 83 88 92  BUN 11 12 14   CREATININE 1.07* 0.93 0.82  CALCIUM 9.5 9.6 8.9  MG 1.8  --   --      TELE   afib with rapid ventricular response with activity (HR 150s) and bradycardia with pauses at night.  Radiology/Studies  Dg Chest 2 View  Result Date: 11/16/2015 CLINICAL DATA:  Atrial fibrillation and short of breath EXAM: CHEST  2 VIEW COMPARISON:  09/21/2015 FINDINGS: Postop CABG. Heart size mildly enlarged. Pulmonary vascular congestion slightly increased. Bilateral pleural effusions left greater than right similar to the prior study. Bibasilar atelectasis unchanged. Negative for pulmonary edema. IMPRESSION: Pulmonary vascular congestion with bilateral pleural effusions consistent with fluid overload. Bibasilar atelectasis left greater than right unchanged from the prior study. Electronically Signed   By: [name]  [name] M.D.   On: 11/16/2015 21:51   2D ECHO: 11/18/2015 LV EF: 45- 50% Study Conclusions - Left ventricle: The cavity size was normal. There was mild concentric hypertrophy. Systolic function was mildly reduced.  The estimated ejection fraction was in the range of 45% to 50%. Diffuse hypokinesis. The study is not technically sufficient to allow evaluation of LV diastolic function. - Aortic valve: Transvalvular velocity was within the normal range. There was no stenosis. There was no regurgitation. - Mitral valve: Calcified annulus. Mildly thickened leaflets . Transvalvular velocity was within the normal range. There was no evidence for stenosis. There was mild regurgitation. - Left atrium: The atrium was severely dilated. - Right ventricle: The cavity size was normal. Wall thickness was normal. Systolic function was normal. - Atrial septum: No defect or patent foramen ovale was identified by color flow Doppler. - Tricuspid valve: There was moderate regurgitation. - Pulmonary arteries: Systolic pressure was severely increased. PA peak pressure: 59 mm Hg (S). - Pericardium, extracardiac: There was a large left pleural effusion. Impressions: - LVEF may be underestimated due to ventricular ectopy.   ASSESSMENT AND PLAN  Ms. [name] is a 75 year old female with a past medical history of CAD s/p CABG in 2009, chronic atrial fibrillation (on Eliquis), Hx of CVA, COPD, HTN, ischemic cardiomyopathy (EF 45-50%) and CKD who presented to the ED on 11/16/15 with palpitations and SOB and found to be in afib with RVR HR 140s.  Atrial fibrillation with RVR/tachy-brady: continue anticoagulation with apixaban for CHADSVASC score of 8. Continue metoprolol 50 mg TID and digoxin at low dose 0.125 mg daily. Digoxin level was normal 11/2015. She has not tolerated amio in the past, has a history of digoxin toxicity and hypotension limits titration of AV nodal blocking agents. She continues to have afib with rapid ventricular response with activity and bradycardia with ~2.3 second pauses at night. EP has seen and plans for PPM placement today without AVN ablation. Plans are to just titrate medical therapy  for rate control.   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 Volume appears improved after IV lasix 40mg  x2. Net -3.6 L. She was resumed on oral lasix 40mg  BID and continues to do well on this. Dilt was discontinued in order to further titrate beta-blocker.   History of CAD s/p CABG: Had nonischemic nuclear study in 2015, denies chest pain. Continue statin and beta blocker. No ASA due to need for NOAC.  HTN: BPs have been soft.   NSVT: 9 beat run noted on tele.  Continue   BB  Signed, Cline Crock PA-C  Pager [phone]

## 2015-11-23 NOTE — Interval H&P Note (Signed)
History and Physical Interval Note:  11/23/2015 2:43 PM  [name]  has presented today for surgery, with the diagnosis of bradycardia  The various methods of treatment have been discussed with the patient and family. After consideration of risks, benefits and other options for treatment, the patient has consented to  Procedure(s): Pacemaker Implant (N/A) as a surgical intervention .  The patient's history has been reviewed, patient examined, no change in status, stable for surgery.  I have reviewed the patient's chart and labs.  Questions were answered to the patient's satisfaction.     Lewayne BuntingGregg Taylor

## 2015-11-23 NOTE — H&P (View-Only) (Signed)
ELECTROPHYSIOLOGY CONSULT NOTE    Patient ID: [name] MRN: [id number], DOB/AGE: [birth year] 75 y.o.  Admit date: 11/16/2015 Date of Consult: 11/22/2015  Primary Physician: [name], MD Primary Cardiologist: Herbie Baltimore  Reason for Consultation: atrial fibrillation   HPI:  [name] is a 75 y.o. female with a past medical history significant for CAD s/p CABG, PVD, prior CVA, COPD, pulmonary hypertension, hypothyroidism and permanent atrial fibrillation with difficult to control ventricular rate. She was previously on amiodarone and doing very well with rate control but this was discontinued and her rate control has been adjusted several times since then.  Per my conversation with Dr Herbie Baltimore this morning, she had previous amio and dig toxicity.  She was admitted this time with recurrent tachypalpitations and a feeling like she couldn't catch her breath. She has had several admissions in the last few months for tachypalpitations needing increased rate control.  Unfortunately, rate control has been limited by bradycardia as well as hypotension. She was seen by Dr Ladona Ridgel in June of this year at which time PPM implant was deferred.  EP has been asked to evaluate again for treatment options.  She currently reports that she is feeling better. She has chronic shortness of breath on exertion and fatigue but no chest pain. She feels her heart racing when she gets up to walk. Telemetry has demonstrated atrial fibrillation with rates 40's-140's this admission.  She has not had syncope, pre-syncope or dizziness. No recent fevers or chills.   Past Medical History:  Diagnosis Date  . Anxiety   . CAD (coronary artery disease), native coronary artery 2009   75% LAD, diffuse 75-90% RCA --> Referred for CABG + MAZE;  Cardiolite 12/2013: No ischemia or Infarction  . Cervical neck pain with evidence of disc disease    with need for surgery -- November 2015  . Chronic low back pain    scoliosis & lordosis  .  CKD (chronic kidney disease)   . COPD (chronic obstructive pulmonary disease) (HCC)   . Dyslipidemia, goal LDL below 70    On Crestor, followed by PCP  . Essential hypertension   . H/O ischemic left MCA stroke 11/18/2014   MRI/MRA of head: Multifocal left MCA infarct.. Also possible left ACA territory infarct -- complete occlusion of left MCA distally at M2 level. Marked left ACA attenuation.  . H/O: pneumonia   . Hypothyroidism   . Ischemic cardiomyopathy 2012   EF ~40-45% by Echo  . Osteoarthritis of back    And neck, hands,spine  . PAD (peripheral artery disease) (HCC) 2007   s/p L Ileac A stent ; most recent Dopplers July 2012: Less than 50% reduction bilaterally. ABI 0.96 on the right 0.88 on left;;;12/27/2011   -ABI right .87 and left ABI .78  ,LEFT CIA and EIA stent normall patency, left CFA,SFA,and popliteal 0-49%; rgt proximal SFA 50-69%,rgt CIA,EIA, and CFA 0-49%  . Persistent atrial fibrillation (HCC) 04/05/2012   Now permanent A. fib s/p MAZE -- recurrence, cardioversion-converted to sinus bradycardia --> Now persistent  . Pulmonary hypertension (HCC)    Estimated PA pressure on Echo January 2017 = 59 mmHg with dilated IVC, severely dilated RA and moderate TR.  . S/P CABG x 2 2009   LIMA-LAD, SVG-RCA, with AV fistula ligation and Maze procedure  . Stroke (HCC)   . TIA (transient ischemic attack) 04/05/2015     Surgical History:  Past Surgical History:  Procedure Laterality Date  . ABDOMINAL HYSTERECTOMY    .  ANTERIOR CERVICAL DECOMP/DISCECTOMY FUSION N/A 01/23/2014   Procedure: ANTERIOR CERVICAL DECOMPRESSION/DISCECTOMY FUSION CERVICAL FOUR-FIVE,CERVICAL FIVE-SIX,CERVICAL SIX-SEVEN;  Surgeon: Mariam DollarGary P Cram, MD;  Location: [facility];  Service: Neurosurgery;  Laterality: N/A;  . CARDIAC CATHETERIZATION  07/30/2007   75% LAD ,3 stenoses of 75-90% in  RCA;circumflex from proximal RCA with no lesion seen,normal ramus intermediate branh; normal LV systoilc function  .  CARDIOVERSION  04/05/2012   Procedure: CARDIOVERSION;  Surgeon: Thurmon FairMihai Croitoru, MD;  Location: [facility];  Service: Cardiovascular;  Laterality: N/A;  . COLONOSCOPY WITH PROPOFOL N/A 11/12/2014   Procedure: COLONOSCOPY WITH PROPOFOL;  Surgeon: Charna ElizabethJyothi Mann, MD;  Location: [facility];  Service: Endoscopy;  Laterality: N/A;  . CORONARY ARTERY BYPASS GRAFT  2009   LIMA-LAD, SVG-RCA (also MAZE & AV fistula ligation)  . ILIAC ARTERY STENT Left 04/06/2005   Ex Iliac - CFA (Smart STENTS -- 7x4 in EIA, 6 x 3 CFA)   . MAZE  2009   along with CABG  . NM CARDIOLITE LTD  12/2013   Non-gated for Afib; No Ischemia or Infarction.  Marland Kitchen. NM MYOVIEW LTD  May 2013   No ischemia or Infarct  . TRANSTHORACIC ECHOCARDIOGRAM  Jan 2012   EF ~40-45%, global HK; PAP ~45-50 mmHg  . TRANSTHORACIC ECHOCARDIOGRAM  Jan 2017   EF ~50%. No RWMA, Afib. Paradoxical Septal motion. no DD assessment. Mod LA dilation.  PAP ~59 mmHg & dilated IVC. Mod TR. Severe RA dilation.     Prescriptions Prior to Admission  Medication Sig Dispense Refill Last Dose  . AMITIZA 24 MCG capsule Take 24 mcg by mouth every evening.    11/16/2015 at Unknown time  . apixaban (ELIQUIS) 5 MG TABS tablet Take 1 tablet (5 mg total) by mouth 2 (two) times daily. 60 tablet 0 11/16/2015 at 1800  . Cholecalciferol (VITAMIN D3) 2000 units TABS Take 1 tablet by mouth daily.   11/16/2015 at Unknown time  . diltiazem (CARDIZEM CD) 120 MG 24 hr capsule Take 1 capsule (120 mg total) by mouth daily. NEED OV. 30 capsule 2 11/16/2015 at Unknown time  . furosemide (LASIX) 20 MG tablet Take 1 tablet (20 mg total) by mouth every other day. 30 tablet 0 11/16/2015 at Unknown time  . levothyroxine (SYNTHROID, LEVOTHROID) 50 MCG tablet Take 1 tablet (50 mcg total) by mouth daily before breakfast. 30 tablet 0 11/16/2015 at Unknown time  . metoprolol (LOPRESSOR) 50 MG tablet TAKE 0.5 TABLETS (25 MG TOTAL) BY MOUTH 2 (TWO) TIMES DAILY. 30 tablet 4 11/16/2015 at 1800  . Multiple  Vitamins-Minerals (PRESERVISION AREDS 2) CAPS Take 1 capsule by mouth 2 (two) times daily.   11/16/2015 at Unknown time  . oxyCODONE-acetaminophen (PERCOCET/ROXICET) 5-325 MG tablet Take 1 tablet by mouth every 6 (six) hours as needed for moderate pain.    11/16/2015 at Unknown time  . rosuvastatin (CRESTOR) 5 MG tablet TAKE 1 TABLET BY MOUTH IN THE EVENING 30 tablet 1 11/16/2015 at Unknown time  . feeding supplement (BOOST / RESOURCE BREEZE) LIQD Take 1 Container by mouth 3 (three) times daily between meals. (Patient not taking: Reported on 11/17/2015) 90 Container 0 Not Taking at Unknown time  . senna-docusate (SENOKOT-S) 8.6-50 MG tablet Take 1 tablet by mouth at bedtime as needed for mild constipation. (Patient not taking: Reported on 11/17/2015) 30 tablet 0 Not Taking at Unknown time    Inpatient Medications:  . apixaban  5 mg Oral BID  . cholecalciferol  2,000 Units Oral Daily  .  digoxin  0.125 mg Oral Daily  . feeding supplement  1 Container Oral TID BM  . ferrous sulfate  325 mg Oral Q breakfast  . furosemide  40 mg Oral Daily  . levothyroxine  37.5 mcg Oral QAC breakfast  . lubiprostone  24 mcg Oral QPM  . metoprolol  75 mg Oral TID  . multivitamin  1 tablet Oral BID  . polyethylene glycol  34 g Oral BID  . potassium chloride  20 mEq Oral Daily  . potassium chloride  20 mEq Oral Once  . rosuvastatin  5 mg Oral QPM  . senna-docusate  1 tablet Oral BID  . sodium chloride flush  3 mL Intravenous Q12H    Allergies: No Known Allergies  Social History   Social History  . Marital status: Widowed    Spouse name: N/A  . Number of children: N/A  . Years of education: N/A   Occupational History  . Not on file.   Social History Main Topics  . Smoking status: Former Smoker    Packs/day: 0.50    Types: Cigarettes    Quit date: 01/07/1998  . Smokeless tobacco: Never Used  . Alcohol use No  . Drug use: No  . Sexual activity: Not on file   Other Topics Concern  . Not on file    Social History Narrative    She is a divorced mother of 1 and one child who died. Her daughter is here with her   today. She has got 4 grandchildren. She is a native of [place] and only has an occasional alcoholic   beverage. She does not get routine exercise mostly because of her spine pain. She did previously smoke but quit many years ago.      Family History  Problem Relation Age of Onset  . Heart attack Mother     Late 2270s  . Stroke Mother   . Stroke Father   . Heart attack Father     3180s  . Heart disease Brother 60    Cardiomegaly  . Stroke Sister   . Heart attack Son 2238     Review of Systems: All other systems reviewed and are otherwise negative except as noted above.  Physical Exam: Vitals:   11/21/15 1500 11/21/15 2122 11/22/15 0332 11/22/15 1310  BP: (!) 100/50 (!) 89/46 (!) 93/50 (!) 104/52  Pulse: (!) 122 60 68 (!) 58  Resp: 18 18 18 18   Temp: 98.4 F (36.9 C) 97.5 F (36.4 C) 97.6 F (36.4 C) 98.4 F (36.9 C)  TempSrc: Oral Oral Oral Oral  SpO2: (!) 2% 100% 98% 92%  Weight:   76 lb 9.6 oz (34.7 kg)   Height:        GEN- The patient is elderly, frail, and chronically ill appearing, alert and oriented x 3 today.   HEENT: normocephalic, atraumatic; sclera clear, conjunctiva pink; hearing intact; oropharynx clear; neck supple  Lungs- Clear to ausculation bilaterally, normal work of breathing.  No wheezes, rales, rhonchi Heart- Irregular rate and rhythm  GI- soft, non-tender, non-distended, bowel sounds present  Extremities- no clubbing, cyanosis, or edema  MS- no significant deformity or atrophy Skin- warm and dry, no rash or lesion Psych- euthymic mood, full affect Neuro- strength and sensation are intact  Labs:   Lab Results  Component Value Date   WBC 7.9 11/16/2015   HGB 10.3 (L) 11/16/2015   HCT 35.8 11/17/2015   MCV 75.8 (L) 11/16/2015   PLT 329 11/16/2015  Recent Labs Lab 11/22/15 0513  NA 136  K 4.7  CL 86*  CO2 42*  BUN 12   CREATININE 0.93  CALCIUM 9.6  GLUCOSE 88      Radiology/Studies: Dg Chest 2 View Result Date: 11/16/2015 CLINICAL DATA:  Atrial fibrillation and short of breath EXAM: CHEST  2 VIEW COMPARISON:  09/21/2015 FINDINGS: Postop CABG. Heart size mildly enlarged. Pulmonary vascular congestion slightly increased. Bilateral pleural effusions left greater than right similar to the prior study. Bibasilar atelectasis unchanged. Negative for pulmonary edema. IMPRESSION: Pulmonary vascular congestion with bilateral pleural effusions consistent with fluid overload. Bibasilar atelectasis left greater than right unchanged from the prior study. Electronically Signed   By: Marlan Palau M.D.   On: 11/16/2015 21:51    [id number] fibrillation, v rate 142  TELEMETRY: atrial fibrillation, v rates 30-140's  Assessment/Plan: 1.  Permanent atrial fibrillation with tachy/brady syndrome The patient has had recurrent admissions for AF with RVR. Medical therapy is limited by bradycardia and hypotension. She has had previous amio and digoxin toxicity.  Options are severely limited.  Her comorbidities and weight make any procedure significantly higher risk.  We have discussed the possibility of a pacemaker today and the fact that she is at increased risk.  She would like to speak further with Dr Ladona Ridgel.  Continue Eliquis long term for CHADS2VASC of at least 7   2. CAD s/p CABG No recent ischemic symptoms Continue medical therapy   3.  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  Failure to thrive May benefit from palliative care consult to discuss goals of care   Signed, Gypsy Balsam, NP 11/22/2015 3:17 PM    EP attending  Patient seen and examined. I've reviewed the findings as noted above by Magdalene Molly, nurse practitioner. The patient is well known to me. She is a pleasant 75 year old woman with coronary disease, chronic atrial fibrillation, and  pulmonary hypertension. The patient has been failing to thrive for several months and has had multiple hospitalizations for rapid atrial fibrillation. Previously she also had bradycardia with very slow heart rates limiting her choice of medical therapy. The patient presented again with additional rapid atrial fibrillation and acute on chronic systolic and diastolic heart failure. She is improved but her ventricular rates have remained elevated, about 100. Previously, she had developed severe bradycardia. Her exam demonstrates a frail 75 year old woman in no acute distress. Lungs revealed scattered rales in the bases. Cardiovascular examination irregular rhythm with tachycardia. There is a soft systolic murmur. Extremities demonstrated no edema. Telemetry demonstrates atrial fibrillation with a rapid ventricular response. Labs and chest x-ray were reviewed.  Assessment 1. Atrial fibrillation with both bradycardia and tachycardia - I discussed treatment options with the patient and her daughter in detail. The risks, goals, benefits, and expectations of permanent pacemaker insertion have been reviewed. I would not recommend AV node ablation at this time, although we would hope to control her ventricular rate by up titration of her medical therapy. 2. Coronary artery disease - the patient has no anginal symptoms. She is status post bypass surgery. 3. Prior stroke - she will continue systemic anticoagulation. 4. Peripheral vascular disease - the patient has minimal claudication. She is not particularly active and is relatively sedentary.  Lewayne Bunting, M.D.

## 2015-11-23 NOTE — Progress Notes (Signed)
Nutrition Follow Up  DOCUMENTATION CODES:   Non-severe (moderate) malnutrition in context of chronic illness, Underweight  INTERVENTION:    Continue Boost Breeze po TID, each supplement provides 250 kcal and 9 grams of protein  NUTRITION DIAGNOSIS:   Malnutrition related to chronic illness as evidenced by moderate depletion of body fat, moderate depletions of muscle mass, ongoing  GOAL:   Patient will meet greater than or equal to 90% of their needs, progressing  MONITOR:   PO intake, Supplement acceptance, Labs, Weight trends, I & O's  ASSESSMENT:   75 yo Female with a PMH of CAD s/p CABG in 2009, chronic atrial fibrillation(on Eliquis), CVA, COPD, HTN, ischemic cardiomyopathy (EF 50%), and CKD. She presented to the ED on 11/16/15 with palpitations and SOB.   Patient currently NPO for pacemaker procedure. Previously on a Heart Healthy diet; PO intake was 50-75% per flowsheet records. Malnutrition ongoing. Has Boost Breeze oral nutrition supplements ordered.  Diet Order:  Diet NPO time specified Except for: Sips with Meds  Skin:  Reviewed, no issues  Last BM:  8/14  Height:   Ht Readings from Last 1 Encounters:  11/17/15 5' (1.524 m)    Weight:   Wt Readings from Last 1 Encounters:  11/23/15 76 lb 1.6 oz (34.5 kg)    Ideal Body Weight:  45.4 kg  BMI:  Body mass index is 14.86 kg/m.  Estimated Nutritional Needs:   Kcal:  1150-1350  Protein:  40-55 gm  Fluid:  >/= 1.5 L  EDUCATION NEEDS:   No education needs identified at this time  Maureen ChattersKatie Rayneisha Bouza, RD, LDN Pager #: [phone] After-Hours Pager #: [phone]

## 2015-11-24 ENCOUNTER — Inpatient Hospital Stay (HOSPITAL_COMMUNITY): Payer: Medicare HMO

## 2015-11-24 ENCOUNTER — Encounter (HOSPITAL_COMMUNITY): Payer: Self-pay | Admitting: Internal Medicine

## 2015-11-24 DIAGNOSIS — Z950 Presence of cardiac pacemaker: Secondary | ICD-10-CM

## 2015-11-24 DIAGNOSIS — I10 Essential (primary) hypertension: Secondary | ICD-10-CM

## 2015-11-24 LAB — CBC
HCT: 40 % (ref 36.0–46.0)
Hemoglobin: 11.1 g/dL — ABNORMAL LOW (ref 12.0–15.0)
MCH: 20.5 pg — ABNORMAL LOW (ref 26.0–34.0)
MCHC: 27.8 g/dL — ABNORMAL LOW (ref 30.0–36.0)
MCV: 73.9 fL — ABNORMAL LOW (ref 78.0–100.0)
Platelets: 167 K/uL (ref 150–400)
RBC: 5.41 MIL/uL — ABNORMAL HIGH (ref 3.87–5.11)
RDW: 19.2 % — ABNORMAL HIGH (ref 11.5–15.5)
WBC: 12.2 K/uL — ABNORMAL HIGH (ref 4.0–10.5)

## 2015-11-24 LAB — BASIC METABOLIC PANEL
ANION GAP: 11 (ref 5–15)
BUN: 16 mg/dL (ref 6–20)
CHLORIDE: 88 mmol/L — AB (ref 101–111)
CO2: 35 mmol/L — AB (ref 22–32)
Calcium: 8.7 mg/dL — ABNORMAL LOW (ref 8.9–10.3)
Creatinine, Ser: 0.74 mg/dL (ref 0.44–1.00)
GFR calc Af Amer: >60 mL/min (ref >60)
GFR calc non Af Amer: >60 mL/min (ref >60)
Glucose, Bld: 77 mg/dL (ref 65–99)
Potassium: 3.2 mmol/L — ABNORMAL LOW (ref 3.5–5.1)
SODIUM: 134 mmol/L — AB (ref 135–145)

## 2015-11-24 MED ORDER — FUROSEMIDE 20 MG PO TABS: 20.0000 mg | ORAL_TABLET | ORAL | 0 refills | Status: AC

## 2015-11-24 MED ORDER — SODIUM CHLORIDE 0.9 % IV BOLUS (SEPSIS)
500.0000 mL | Freq: Once | INTRAVENOUS | Status: AC
  Administered 2015-11-24: 500 mL via INTRAVENOUS

## 2015-11-24 MED ORDER — ENSURE CLINICAL ST REVIGOR PO LIQD: 237.0000 mL | Freq: Three times a day (TID) | ORAL | Status: DC

## 2015-11-24 MED ORDER — FERROUS SULFATE 325 (65 FE) MG PO TABS: 325.0000 mg | ORAL_TABLET | Freq: Every day | ORAL | 3 refills | Status: AC

## 2015-11-24 MED ORDER — POTASSIUM CHLORIDE ER 10 MEQ PO TBCR: 10.0000 meq | EXTENDED_RELEASE_TABLET | Freq: Every day | ORAL | 0 refills | Status: DC

## 2015-11-24 MED ORDER — DIGOXIN 0.25 MG/ML IJ SOLN
0.2500 mg | INTRAMUSCULAR | Status: AC
  Administered 2015-11-24: 0.25 mg via INTRAVENOUS
  Filled 2015-11-24: qty 2

## 2015-11-24 MED ORDER — POTASSIUM CHLORIDE CRYS ER 20 MEQ PO TBCR
40.0000 meq | EXTENDED_RELEASE_TABLET | Freq: Once | ORAL | Status: AC
  Administered 2015-11-24: 40 meq via ORAL
  Filled 2015-11-24: qty 2

## 2015-11-24 MED ORDER — LEVOTHYROXINE SODIUM 75 MCG PO TABS: 37.5000 ug | ORAL_TABLET | Freq: Every day | ORAL | 0 refills | Status: DC

## 2015-11-24 MED ORDER — DIGOXIN 125 MCG PO TABS: 0.0625 mg | ORAL_TABLET | Freq: Every day | ORAL | Status: DC

## 2015-11-24 MED ORDER — METOPROLOL TARTRATE 75 MG PO TABS: 75.0000 mg | ORAL_TABLET | Freq: Three times a day (TID) | ORAL | 0 refills | Status: DC

## 2015-11-24 MED ORDER — DIGOXIN 62.5 MCG PO TABS: 0.0625 mg | ORAL_TABLET | Freq: Every day | ORAL | 0 refills | Status: DC

## 2015-11-24 NOTE — Progress Notes (Addendum)
Patient Name: [name] Date of Encounter: 11/24/2015  Primary Cardiologist: Dr. Herbie Baltimore Patient Profile: [name] is a 75 year old female with a past medical history of CAD s/p CABG in 2009, chronic atrial fibrillation(on Eliquis), CVA, COPD, HTN, ischemic cardiomyopathy (EF 50%), and CKD. She presented to the ED on 11/16/15 with palpitations and SOB, found to be in Afib RVR with HR in 140's. Had PPM placed on 11/23/15, no AV nodal ablation.   SUBJECTIVE: Feels well, denies palpitations. Wants to go home.    OBJECTIVE Vitals:   11/23/15 1830 11/23/15 2125 11/23/15 2129 11/24/15 0535  BP: 101/63 (!) 116/50 115/78 119/70  Pulse: 73 84 61 74  Resp:   20 18  Temp:   97.8 F (36.6 C) 97.5 F (36.4 C)  TempSrc:   Oral   SpO2:   98% 100%  Weight:    74 lb 12.8 oz (33.9 kg)  Height:        Intake/Output Summary (Last 24 hours) at 11/24/15 1002 Last data filed at 11/23/15 2200  Gross per 24 hour  Intake              290 ml  Output              651 ml  Net             -361 ml   Filed Weights   11/22/15 0332 11/23/15 0448 11/24/15 0535  Weight: 76 lb 9.6 oz (34.7 kg) 76 lb 1.6 oz (34.5 kg) 74 lb 12.8 oz (33.9 kg)    PHYSICAL EXAM General: Well developed, well nourished, female in no acute distress. Head: Normocephalic, atraumatic.  Neck: Supple without bruits, no JVD. Lungs:  Resp regular and unlabored, CTA. Heart: RRR, S1, S2, no S3, S4, or murmur; no rub. Abdomen: Soft, non-tender, non-distended, BS + x 4.  Extremities: No clubbing, cyanosis, no edema.  Neuro: Alert and oriented X 3. Moves all extremities spontaneously. Psych: Normal affect.  LABS: CBC: Recent Labs  11/23/15 0241 11/24/15 0253  WBC 8.2 12.2*  HGB 11.1* 11.1*  HCT 40.1 40.0  MCV 74.4* 73.9*  PLT 186 167   Basic Metabolic Panel: Recent Labs  11/23/15 0241 11/24/15 0253  NA 134* 134*  K 4.0 3.2*  CL 89* 88*  CO2 39* 35*  GLUCOSE 92 77  BUN 14 16  CREATININE 0.82 0.74  CALCIUM 8.9  8.7*    Current Facility-Administered Medications:  .  0.9 %  sodium chloride infusion, 250 mL, Intravenous, PRN, Lavone Neri Opyd, MD .  acetaminophen (TYLENOL) tablet 325-650 mg, 325-650 mg, Oral, Q4H PRN, Marinus Maw, MD .  acetaminophen (TYLENOL) tablet 650 mg, 650 mg, Oral, Q4H PRN, Briscoe Deutscher, MD .  apixaban (ELIQUIS) tablet 5 mg, 5 mg, Oral, BID, Starleen Arms, MD, 5 mg at 11/23/15 2130 .  ceFAZolin (ANCEF) IVPB 1 g/50 mL premix, 1 g, Intravenous, Q8H, Marinus Maw, MD, 1 g at 11/24/15 0603 .  cholecalciferol (VITAMIN D) tablet 2,000 Units, 2,000 Units, Oral, Daily, Briscoe Deutscher, MD, 2,000 Units at 11/22/15 0936 .  digoxin (LANOXIN) tablet 0.125 mg, 0.125 mg, Oral, Daily, Tonny Bollman, MD, 0.125 mg at 11/23/15 1109 .  feeding supplement (BOOST / RESOURCE BREEZE) liquid 1 Container, 1 Container, Oral, TID BM, Briscoe Deutscher, MD, 1 Container at 11/19/15 1506 .  ferrous sulfate tablet 325 mg, 325 mg, Oral, Q breakfast, Starleen Arms, MD, 325 mg at 11/24/15  [id number] .  furosemide (LASIX) tablet 40 mg, 40 mg, Oral, Daily, Starleen Armsawood S Elgergawy, MD, 40 mg at 11/23/15 1109 .  levothyroxine (SYNTHROID, LEVOTHROID) tablet 37.5 mcg, 37.5 mcg, Oral, QAC breakfast, Starleen Armsawood S Elgergawy, MD, 37.5 mcg at 11/24/15 0604 .  lubiprostone (AMITIZA) capsule 24 mcg, 24 mcg, Oral, QPM, Lavone Neriimothy S Opyd, MD, 24 mcg at 11/23/15 1843 .  metoprolol (LOPRESSOR) injection 2.5 mg, 2.5 mg, Intravenous, Q6H PRN, Little IshikawaErin E Smith, NP, 2.5 mg at 11/22/15 1958 .  metoprolol tartrate (LOPRESSOR) tablet 75 mg, 75 mg, Oral, TID, Jonelle SidleSamuel G McDowell, MD, 75 mg at 11/23/15 2125 .  multivitamin (PROSIGHT) tablet 1 tablet, 1 tablet, Oral, BID, Briscoe Deutscherimothy S Opyd, MD, 1 tablet at 11/23/15 2125 .  ondansetron (ZOFRAN) injection 4 mg, 4 mg, Intravenous, Q6H PRN, Lavone Neriimothy S Opyd, MD, 4 mg at 11/20/15 1435 .  ondansetron (ZOFRAN) injection 4 mg, 4 mg, Intravenous, Q6H PRN, Marinus MawGregg W Taylor, MD .  oxyCODONE-acetaminophen  (PERCOCET/ROXICET) 5-325 MG per tablet 1-2 tablet, 1-2 tablet, Oral, Q6H, Marily LenteAmber K Seiler, NP, 2 tablet at 11/24/15 [phone] .  potassium chloride SA (K-DUR,KLOR-CON) CR tablet 20 mEq, 20 mEq, Oral, Daily, Starleen Armsawood S Elgergawy, MD, 20 mEq at 11/24/15 0813 .  rosuvastatin (CRESTOR) tablet 5 mg, 5 mg, Oral, QPM, Lavone Neriimothy S Opyd, MD, 5 mg at 11/23/15 1843 .  senna-docusate (Senokot-S) tablet 1 tablet, 1 tablet, Oral, BID, Starleen Armsawood S Elgergawy, MD, 1 tablet at 11/22/15 2143 .  sodium chloride flush (NS) 0.9 % injection 3 mL, 3 mL, Intravenous, Q12H, Lavone Neriimothy S Opyd, MD, 3 mL at 11/23/15 2200 .  sodium chloride flush (NS) 0.9 % injection 3 mL, 3 mL, Intravenous, PRN, Briscoe Deutscherimothy S Opyd, MD    TELE:    Afib, rates as high as 160 at times.      Radiology/Studies: Dg Chest 2 View  Result Date: 11/24/2015 CLINICAL DATA:  Pacemaker placement. EXAM: CHEST  2 VIEW COMPARISON:  11/16/2015 FINDINGS: AP and lateral views of the chest show a new left-sided single lead permanent pacemaker. No evidence for pneumothorax. Lungs are hyperexpanded. No pulmonary edema. Small right pleural effusion seen previously has resolved in the interval. There is a residual small left pleural effusion on today's exam. Cardiopericardial silhouette is at upper limits of normal for size. Patient is status post CABG. Telemetry leads overlie the chest. IMPRESSION: No evidence for left-sided pneumothorax status post permanent pacemaker placement. Emphysema. Small left pleural effusion. Electronically Signed   By: Kennith CenterEric  Mansell M.D.   On: 11/24/2015 07:38     Current Medications:  . apixaban  5 mg Oral BID  .  ceFAZolin (ANCEF) IV  1 g Intravenous Q8H  . cholecalciferol  2,000 Units Oral Daily  . digoxin  0.125 mg Oral Daily  . feeding supplement  1 Container Oral TID BM  . ferrous sulfate  325 mg Oral Q breakfast  . furosemide  40 mg Oral Daily  . levothyroxine  37.5 mcg Oral QAC breakfast  . lubiprostone  24 mcg Oral QPM  . metoprolol  75 mg  Oral TID  . multivitamin  1 tablet Oral BID  . oxyCODONE-acetaminophen  1-2 tablet Oral Q6H  . potassium chloride  20 mEq Oral Daily  . rosuvastatin  5 mg Oral QPM  . senna-docusate  1 tablet Oral BID  . sodium chloride flush  3 mL Intravenous Q12H      ASSESSMENT AND PLAN: Principal Problem:   Atrial fibrillation with RVR (HCC) Active Problems:   2  Vessel CAD - s/p CABG; LIMA-LAD, SVG-RCA   Essential hypertension   Hypothyroidism   H/O ischemic left MCA stroke   Chronic atrial fibrillation (HCC)   Chronic anticoagulation   Hypokalemia   Microcytic anemia   Acute on chronic diastolic CHF (congestive heart failure) (HCC)   Chronic atrial fibrillation with RVR (HCC)   Malnutrition of moderate degree   Atrial fibrillation (HCC)   Tachy-brady syndrome (HCC)  1. Atrial fibrillation with RVR/tachy-brady: continue anticoagulation with apixaban for CHADSVASC score of 8. Continue digoxin at low dose 0.125 mg daily. Digoxin level was normal 11/2015. She has not tolerated amio in the past, has a history of digoxin toxicity and hypotension limits titration of AV nodal blocking agents. She continues to have afib with rapid ventricular response with activity and bradycardia with ~2.3 second pauses at night.EP placed PPM yesterday.   Will plan for medical therapy for rate control, overnight she continues to have frequent runs of afib RVR with rates as high as 160. BP this am is 119/70. Can try to increase metoprolol today to 100mg  TID, I doubt that her BP will tolerate but have limited options for rate control.   Per consult note last year while admitted for CVA, her Amio was stopped bc "Amiodarone is only serving as a rate control drug at this point and there are less toxic alternatives. Stop amiodarone and will likely need to adjust beta blocker dose gradually as the amiodarone effect gradually resolves over the next 4-6 months." I have talked with patient and she says that the Amiodarone  negatively affected her eyesight and she will not go back on this drug.   2. 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 Volume appears improved after IV lasix 40mg  x2. Net -3.6 L. She was resumed on oral lasix 40mg  BID and continues to do well on this. Dilt was discontinued in order to further titrate beta-blocker.   3. History of CAD s/p CABG: Had nonischemic nuclear study in 2015, denies chest pain. Continue statin and beta blocker. No ASA due to need for NOAC.  4. HTN: BPs have been soft.    Signed, Little Ishikawa , NP 10:02 AM 11/24/2015 Pager [phone]   Had a long talk with the patient and her daughter today. She is somewhat frustrated and like to go home. Unfortunately weren't really not able to control her heart rate. At this point I think I discuss with electrophysiology, and they would prefer to use beta blocker as opposed to antiarrhythmics because of her prior history of intolerances.  I think medically, she is probably ready for for discharge are just like to see how well she does with rate responsiveness when ambulating.  Plan for now is to give her normal saline bolus, we will load her with digoxin 0.25 mg IV 1. I would then ask in about an hour after the fluid was administered, that she ambulate in the hallway and reevaluate her heart rate. I will back down her diuretic dose for discharge to 20 mg with an additional 20-40 mg when necessary exacerbation of dyspnea or PND/orthopnea.  Plan for rapid heart rate at home would be to take an additional when necessary dose of metoprolol 25-50 mg depending on the heart rate. Standing order metoprolol will be 75mg  3 times a day standing dose.  We had a long talk about her protein malnutrition  we discussed dietary supplementation options for home. He also confirmed her DO NOT RESUSCITATE status. In outpatient  follow-up will discuss thought processes of going forward considering potential minimizing of management.  If she does okay with ambulation and does not get overly symptomatically, I think she probably be stable for discharge later on this afternoon. I will let Dr. Randol KernElGergawy know.  55-60 minutes was spent in direct contact with the patient and her daughter. Over 50% of the time was spent in direct counseling as to potential discharge planning and outpatient management.   Bryan Lemmaavid Harding, M.D., M.S. Interventional Cardiologist   Pager # [phone] Phone # [phone] [address] [place], [place] [address]

## 2015-11-24 NOTE — Care Management Important Message (Signed)
Important Message  Patient Details  Name: Brenda RinksRobina B Glass MRN: [id number] Date of Birth: 08/01/1940   Medicare Important Message Given:  Yes    [name], Brittain Hosie [name] 11/24/2015, 11:20 AM

## 2015-11-24 NOTE — Discharge Summary (Signed)
Brenda RinksRobina B Glass, is a 75 y.o. female  DOB 08/14/1940  MRN [id number].  Admission date:  11/16/2015  Admitting Physician  Briscoe Deutscherimothy S Opyd, MD  Discharge Date:  11/24/2015   Primary MD  Salli RealYun Sun, MD  Recommendations for primary care physician for things to follow:  - Please check CBC, BMP during next visit - Please check TSH, free T4 in 6 weeks, and adjust new Synthroid dose  if needed   Admission Diagnosis  Atrial fibrillation with RVR (HCC) [I48.91] Congestive heart failure, unspecified congestive heart failure chronicity, unspecified congestive heart failure type (HCC) [I50.9]   Discharge Diagnosis  Atrial fibrillation with RVR (HCC) [I48.91] Congestive heart failure, unspecified congestive heart failure chronicity, unspecified congestive heart failure type (HCC) [I50.9]    Principal Problem:   Atrial fibrillation with RVR (HCC) Active Problems:   2 Vessel CAD - s/p CABG; LIMA-LAD, SVG-RCA   Essential hypertension   Hypothyroidism   H/O ischemic left MCA stroke   Chronic atrial fibrillation (HCC)   Chronic anticoagulation   Hypokalemia   Microcytic anemia   Acute on chronic diastolic CHF (congestive heart failure) (HCC)   Chronic atrial fibrillation with RVR (HCC)   Malnutrition of moderate degree   Atrial fibrillation (HCC)   Tachy-brady syndrome (HCC)   Pacemaker      Past Medical History:  Diagnosis Date  . Anxiety   . CAD (coronary artery disease), native coronary artery 2009   75% LAD, diffuse 75-90% RCA --> Referred for CABG + MAZE;  Cardiolite 12/2013: No ischemia or Infarction  . Cervical neck pain with evidence of disc disease    with need for surgery -- November 2015  . Chronic low back pain    scoliosis & lordosis  . CKD (chronic kidney disease)   . COPD (chronic obstructive pulmonary disease) (HCC)   . Dyslipidemia, goal LDL below 70    On Crestor, followed by PCP  .  Essential hypertension   . H/O ischemic left MCA stroke 11/18/2014   MRI/MRA of head: Multifocal left MCA infarct.. Also possible left ACA territory infarct -- complete occlusion of left MCA distally at M2 level. Marked left ACA attenuation.  . H/O: pneumonia   . Hypothyroidism   . Ischemic cardiomyopathy 2012   EF ~40-45% by Echo  . Osteoarthritis of back    And neck, hands,spine  . PAD (peripheral artery disease) (HCC) 2007   s/p L Ileac A stent ; most recent Dopplers July 2012: Less than 50% reduction bilaterally. ABI 0.96 on the right 0.88 on left;;;12/27/2011   -ABI right .87 and left ABI .78  ,LEFT CIA and EIA stent normall patency, left CFA,SFA,and popliteal 0-49%; rgt proximal SFA 50-69%,rgt CIA,EIA, and CFA 0-49%  . Persistent atrial fibrillation (HCC) 04/05/2012   Now permanent A. fib s/p MAZE -- recurrence, cardioversion-converted to sinus bradycardia --> Now persistent  . Pulmonary hypertension (HCC)    Estimated PA pressure on Echo January 2017 = 59 mmHg with dilated IVC, severely dilated RA and moderate TR.  .Marland Kitchen  S/P CABG x 2 2009   LIMA-LAD, SVG-RCA, with AV fistula ligation and Maze procedure  . Stroke (HCC)   . TIA (transient ischemic attack) 04/05/2015    Past Surgical History:  Procedure Laterality Date  . ABDOMINAL HYSTERECTOMY    . ANTERIOR CERVICAL DECOMP/DISCECTOMY FUSION N/A 01/23/2014   Procedure: ANTERIOR CERVICAL DECOMPRESSION/DISCECTOMY FUSION CERVICAL FOUR-FIVE,CERVICAL FIVE-SIX,CERVICAL SIX-SEVEN;  Surgeon: Mariam DollarGary P Cram, MD;  Location: [facility];  Service: Neurosurgery;  Laterality: N/A;  . CARDIAC CATHETERIZATION  07/30/2007   75% LAD ,3 stenoses of 75-90% in  RCA;circumflex from proximal RCA with no lesion seen,normal ramus intermediate branh; normal LV systoilc function  . CARDIOVERSION  04/05/2012   Procedure: CARDIOVERSION;  Surgeon: Thurmon FairMihai Croitoru, MD;  Location: [facility] ENDOS[facility];  Service: Cardiovascular;  Laterality: N/A;  . COLONOSCOPY WITH PROPOFOL N/A  11/12/2014   Procedure: COLONOSCOPY WITH PROPOFOL;  Surgeon: Charna ElizabethJyothi Mann, MD;  Location: [facility];  Service: Endoscopy;  Laterality: N/A;  . CORONARY ARTERY BYPASS GRAFT  2009   LIMA-LAD, SVG-RCA (also MAZE & AV fistula ligation)  . EP IMPLANTABLE DEVICE N/A 11/23/2015   Procedure: Pacemaker Implant;  Surgeon: Marinus MawGregg W Taylor, MD;  [facility];  Service: Cardiovascular;  Laterality: N/A;  . ILIAC ARTERY STENT Left 04/06/2005   Ex Iliac - CFA (Smart STENTS -- 7x4 in EIA, 6 x 3 CFA)   . MAZE  2009   along with CABG  . NM CARDIOLITE LTD  12/2013   Non-gated for Afib; No Ischemia or Infarction.  Marland Kitchen. NM MYOVIEW LTD  May 2013   No ischemia or Infarct  . TRANSTHORACIC ECHOCARDIOGRAM  Jan 2012   EF ~40-45%, global HK; PAP ~45-50 mmHg  . TRANSTHORACIC ECHOCARDIOGRAM  Jan 2017   EF ~50%. No RWMA, Afib. Paradoxical Septal motion. no DD assessment. Mod LA dilation.  PAP ~59 mmHg & dilated IVC. Mod TR. Severe RA dilation.       History of present illness and  Hospital Course:     Kindly see H&P for history of present illness and admission details, please review complete Labs, Consult reports and Test reports for all details in brief  HPI  from the history and physical done on the day of admission 11/17/2015 HPI: Brenda LoganRobina B Glass is a 75 y.o. female with medical history significant for chronic atrial fibrillation on Eliquis,  hypothyroidism, coronary artery disease status post CABG, history of ischemic stroke, COPD, and chronic mid back pain who presents the emergency department for evaluation of palpitations. Patient has chronic atrial fibrillation for which she is anticoagulated with Eliquis and on a rate control strategy with low-dose diltiazem and Lopressor. She had been doing quite well on this regimen and had been in her usual state of health until approximately 2-3 days ago when she noted the onset of intermittent palpitations. There was no chest pain or dyspnea associated with this  initially. Early this morning, the patient woke with palpitations and associated dyspnea, but no chest pain. This persisted throughout the day and the patient eventually took her evening medications a little bit early and then waited for symptoms to improve, but palpitations continued to persist and she came into the ED for evaluation of this. Patient had a nonproductive cough couple of days ago which seems to have resolved. She has not had any fevers, chills, chest pains, abdominal pain, dysuria, nausea, vomiting, or diarrhea. She reports strict adherence to her medication regimen.   ED Course: Upon arrival to the ED, patient is  found to be afebrile, saturating 91% on room air, tachycardic in the 140s, and with stable blood pressure. EKG demonstrates atrial fibrillation with RVR, rate 142, and right axis deviation. Chest x-ray is notable for pulmonary vascular congestion with bilateral pleural effusions consistent with fluid overload. Chemistry panel is notable for a mild hypokalemia and CBC features a stable microcytic anemia with hemoglobin of 10.3 and MCV of 75.8. INR is 3.04, troponin is reassuring at 0.01, and BNP is elevated to 460. Patient was placed on supplemental oxygen at 3 L/m via nasal cannula with resolution of her subjective dyspnea. She was given a 40 mg IV push of Lasix in the emergency department and has begun to diurese. 5 mg IV Lopressor was administered with improvement and rates to the 110-120 range. Patient remains chest pain-free and was stable blood pressure in the emergency department. She'll be given additional IV Lopressor and monitored on the telemetry unit for ongoing evaluation and management of atrial fibrillation with RVR and acute on chronic diastolic CHF.    Hospital Course  75 y.o.femalewith history significant for chronic atrial fibrillation on Eliquis, hypothyroidism, coronary artery disease status post CABG, history of ischemic stroke, COPD, and chronic mid back pain  who presents the emergency department for evaluation of palpitations, Workup significant for A. fib with RVR heart rate and 170s, Seen by cardiology, difficult to control RVR on by mouth medication, requires frequent titration especially in the setting of soft blood pressure.  Chronic atrial fibrillation with RVR  -  a fib RVR with rate as high as 170's on admission - CHADS-VASc is ~21 (age x2, gender, CHF, hx of CVA x2, CAD, HTN) - Continue AC with Eliquis , discussed with pharmacy, this is appropriate dose - She is rate-controlled at home with low-dose Cardizem and Lopressor; had previously required higher doses and digoxin - Management per cardiology, on digoxin, Metoprolol ,  Cardizem been stopped, Medication has been adjusted frequently by cardiology, remains with poor heart rate control, despite multiple adjustments of AV blocking agents, seen by EP, had pacemaker inserted on 8/22 given few pauses and bradycardia. - Heartrate remains hard to control, currently controlled at rest, heart rate in the 80s, a shunt was ambulated, heart rate peaks at 130s on ambulation, she is asymptomatic, discussed with Dr. Herbie Baltimore, plan to discharge on metoprolol 75 3 times a day, and low dose digoxin.  Acute on chronic diastolic CHF  - Presents with SOB, elevated BNP, vascular congestion and effusions on CXR  - TTE (04/06/15) with EF 50%, moderate LAE, moderate TR, severely elevated PA peak pressures - Suspect the current vol o/l is secondary to rapid rate, controlling that as above  - Diuresis per cardiology, transitioned from IV to oral Lasix, will be discharged on Lasix 20 mg oral daily - Daily BMP during diuresis  - Echo EF 45% with diffuse hypokinesis.   Microcytic anemia  - Hgb 10.3 on admission with MCV 75.8  - Both indices appear stable and there is no evidence for active losses  - Workup significant for iron deficiency anemia, started on oral iron supplement,  - Most recent colonoscopy in  2016.  CAD - S/p CABG  - No anginal complaints  - Continue Lopressor, Crestor   Hypertension  - Blood pressure on the lower side, on beta blockers for heart rate control   Hypothyroidism  - T4 was mildly elevated with normal TSH in June 2017 and dose recently reduced from 75 mcg to 50 mcg - TSH 2.6,  within normal limits, free T4 is 1.6, mildly elevated, will decrease Synthroid from 50 g to 37.5 g, will need repeat levels in 6 weeks.  Hypokalemia / hyperkalemia - Repleted  Chronic respiratory failure - Patient reports she is on oxygen at home.  Non-severe (moderate) malnutrition -  in context of chronic illness, Underweight, seen by nutritionist    Discharge Condition:  Stable   Follow UP  Follow-up Information    [facility] Office Follow up on 12/02/2015.   Specialty:  Cardiology Why:  2:30PM, wound check Contact information: [address] [place] [address] [phone]       Lewayne Bunting, MD Follow up on 02/22/2016.   Specialty:  Cardiology Why:  9:45AM Contact information: [address] [place] [address] [phone]        Norma Fredrickson, NP Follow up on 12/03/2015.   Specialties:  Nurse Practitioner, Interventional Cardiology, Cardiology, Radiology Why:  at 8:30am for hospital follow up with Dr. Elissa Hefty NP.  Contact information: [address] [place] [address] [phone]             Discharge Instructions  and  Discharge Medications    Discharge Instructions    Amb referral to AFIB Clinic    Complete by:  As directed   Diet - low sodium heart healthy    Complete by:  As directed   Discharge instructions    Complete by:  As directed   Follow with Primary MD [name], MD in 7 days   Get CBC, CMP,  checked  by Primary MD next visit.    Activity: As tolerated with Full fall precautions use walker/cane & assistance as needed   Disposition  Home    Diet: Heart Healthy , with feeding assistance and aspiration precautions.  For Heart failure patients - Check your Weight same time everyday, if you gain over 2 pounds, or you develop in leg swelling, experience more shortness of breath or chest pain, call your Primary MD immediately. Follow Cardiac Low Salt Diet and 1.5 lit/day fluid restriction.   On your next visit with your primary care physician please Get Medicines reviewed and adjusted.   Please request your Prim.MD to go over all Hospital Tests and Procedure/Radiological results at the follow up, please get all Hospital records sent to your Prim MD by signing hospital release before you go home.   If you experience worsening of your admission symptoms, develop shortness of breath, life threatening emergency, suicidal or homicidal thoughts you must seek medical attention immediately by calling 911 or calling your MD immediately  if symptoms less severe.  You Must read complete instructions/literature along with all the possible adverse reactions/side effects for all the Medicines you take and that have been prescribed to you. Take any new Medicines after you have completely understood and accpet all the possible adverse reactions/side effects.   Do not drive, operating heavy machinery, perform activities at heights, swimming or participation in water activities or provide baby sitting services if your were admitted for syncope or siezures until you have seen by Primary MD or a Neurologist and advised to do so again.  Do not drive when taking Pain medications.    Do not take more than prescribed Pain, Sleep and Anxiety Medications  Special Instructions: If you have smoked or chewed Tobacco  in the last 2 yrs please stop smoking, stop any regular Alcohol  and or any Recreational drug  use.  Wear Seat belts while driving.   Please note  You were cared for by a hospitalist during your hospital stay. If you have any questions  about your discharge medications or the care you received while you were in the hospital after you are discharged, you can call the unit and asked to speak with the hospitalist on call if the hospitalist that took care of you is not available. Once you are discharged, your primary care physician will handle any further medical issues. Please note that NO REFILLS for any discharge medications will be authorized once you are discharged, as it is imperative that you return to your primary care physician (or establish a relationship with a primary care physician if you do not have one) for your aftercare needs so that they can reassess your need for medications and monitor your lab values.   Increase activity slowly    Complete by:  As directed       Medication List    STOP taking these medications   diltiazem 120 MG 24 hr capsule Commonly known as:  CARDIZEM CD     TAKE these medications   AMITIZA 24 MCG capsule Generic drug:  lubiprostone Take 24 mcg by mouth every evening.   apixaban 5 MG Tabs tablet Commonly known as:  ELIQUIS Take 1 tablet (5 mg total) by mouth 2 (two) times daily.   Digoxin 62.5 MCG Tabs Take 0.0625 mg by mouth daily. Start taking on:  11/25/2015   feeding supplement Liqd Take 1 Container by mouth 3 (three) times daily between meals. What changed:  Another medication with the same name was added. Make sure you understand how and when to take each.   feeding supplement Liqd Take 237 mLs by mouth 3 (three) times daily with meals. What changed:  You were already taking a medication with the same name, and this prescription was added. Make sure you understand how and when to take each.   ferrous sulfate 325 (65 FE) MG tablet Take 1 tablet (325 mg total) by mouth daily with breakfast.   furosemide 20 MG tablet Commonly known as:  LASIX Take 1 tablet (20 mg total) by mouth every other day. Take extra dose if having fluid retention, or  leg edema What changed:   additional instructions   levothyroxine 75 MCG tablet Commonly known as:  SYNTHROID, LEVOTHROID Take 0.5 tablets (37.5 mcg total) by mouth daily before breakfast. What changed:  medication strength  how much to take   Metoprolol Tartrate 75 MG Tabs Take 75 mg by mouth 3 (three) times daily. What changed:  See the new instructions.   oxyCODONE-acetaminophen 5-325 MG tablet Commonly known as:  PERCOCET/ROXICET Take 1 tablet by mouth every 6 (six) hours as needed for moderate pain.   potassium chloride 10 MEQ tablet Commonly known as:  K-DUR Take 1 tablet (10 mEq total) by mouth daily.   PRESERVISION AREDS 2 Caps Take 1 capsule by mouth 2 (two) times daily.   rosuvastatin 5 MG tablet Commonly known as:  CRESTOR TAKE 1 TABLET BY MOUTH IN THE EVENING   senna-docusate 8.6-50 MG tablet Commonly known as:  Senokot-S Take 1 tablet by mouth at bedtime as needed for mild constipation.   Vitamin D3 2000 units Tabs Take 1 tablet by mouth daily.         Diet and Activity recommendation: See Discharge Instructions above   Consults obtained -  Cardiology Electro physiology   Major procedures and Radiology Reports -  PLEASE review detailed and final reports for all details, in brief -   PPM insertion 8/22   Dg Chest 2 View  Result Date: 11/24/2015 CLINICAL DATA:  Pacemaker placement. EXAM: CHEST  2 VIEW COMPARISON:  11/16/2015 FINDINGS: AP and lateral views of the chest show a new left-sided single lead permanent pacemaker. No evidence for pneumothorax. Lungs are hyperexpanded. No pulmonary edema. Small right pleural effusion seen previously has resolved in the interval. There is a residual small left pleural effusion on today's exam. Cardiopericardial silhouette is at upper limits of normal for size. Patient is status post CABG. Telemetry leads overlie the chest. IMPRESSION: No evidence for left-sided pneumothorax status post permanent pacemaker placement. Emphysema. Small  left pleural effusion. Electronically Signed   By: Kennith Center M.D.   On: 11/24/2015 07:38   Dg Chest 2 View  Result Date: 11/16/2015 CLINICAL DATA:  Atrial fibrillation and short of breath EXAM: CHEST  2 VIEW COMPARISON:  09/21/2015 FINDINGS: Postop CABG. Heart size mildly enlarged. Pulmonary vascular congestion slightly increased. Bilateral pleural effusions left greater than right similar to the prior study. Bibasilar atelectasis unchanged. Negative for pulmonary edema. IMPRESSION: Pulmonary vascular congestion with bilateral pleural effusions consistent with fluid overload. Bibasilar atelectasis left greater than right unchanged from the prior study. Electronically Signed   By: Marlan Palau M.D.   On: 11/16/2015 21:51    Micro Results     Recent Results (from the past 240 hour(s))  Surgical pcr screen     Status: None   Collection Time: 11/23/15  4:50 AM  Result Value Ref Range Status   MRSA, PCR NEGATIVE NEGATIVE Final   Staphylococcus aureus NEGATIVE NEGATIVE Final    Comment:        The Xpert SA Assay (FDA approved for NASAL specimens in patients over 49 years of age), is one component of a comprehensive surveillance program.  Test performance has been validated by [facility] for patients greater than or equal to 94 year old. It is not intended to diagnose infection nor to guide or monitor treatment.        Today   Subjective:   [name] today has no headache,no chest or abdominal pain,no new weakness tingling or numbness, patient eager to go home today . Objective:   Blood pressure (!) 99/51, pulse 81, temperature 98.3 F (36.8 C), temperature source Oral, resp. rate 18, height 5' (1.524 m), weight 33.9 kg (74 lb 12.8 oz), SpO2 100 %.   Intake/Output Summary (Last 24 hours) at 11/24/15 1419 Last data filed at 11/24/15 1300  Gross per 24 hour  Intake              890 ml  Output             1302 ml  Net             -412 ml    Exam Awake Alert,  Oriented X 3, Frail, thin-appearing [place].AT,PERRAL Supple Neck,No JVD,   Symmetrical Chest wall movement, Good air movement bilaterally, CTAB Irr Irr, ,No Gallops,Rubs or new Murmurs, No Parasternal Heave +ve B.Sounds, Abd Soft, No tenderness, No rebound - guarding or rigidity. No Cyanosis, Clubbing or edema, No new Rash or bruise   Data Review   CBC w Diff: Lab Results  Component Value Date   WBC 12.2 (H) 11/24/2015   HGB 11.1 (L) 11/24/2015   HCT 40.0 11/24/2015   HCT 35.8 11/17/2015   PLT 167 11/24/2015   LYMPHOPCT 11 11/14/2015  MONOPCT 7 11/14/2015   EOSPCT 0 11/14/2015   BASOPCT 0 11/14/2015    CMP: Lab Results  Component Value Date   NA 134 (L) 11/24/2015   K 3.2 (L) 11/24/2015   CL 88 (L) 11/24/2015   CO2 35 (H) 11/24/2015   BUN 16 11/24/2015   CREATININE 0.74 11/24/2015   PROT 6.0 (L) 04/09/2015   ALBUMIN 3.4 (L) 04/09/2015   BILITOT 0.8 04/09/2015   ALKPHOS 57 04/09/2015   AST 19 04/09/2015   ALT 10 (L) 04/09/2015  .   Total Time in preparing paper work, data evaluation and todays exam - 35 minutes  ELGERGAWY, DAWOOD M.D on 11/24/2015 at 2:19 PM  [facility]   Office  [phone]

## 2015-11-24 NOTE — Progress Notes (Signed)
SUBJECTIVE: The patient is doing well today.  At this time, she denies chest pain, shortness of breath, or any new concerns.  Wants to go home.  Marland Kitchen. apixaban  5 mg Oral BID  .  ceFAZolin (ANCEF) IV  1 g Intravenous Q8H  . cholecalciferol  2,000 Units Oral Daily  . digoxin  0.125 mg Oral Daily  . feeding supplement  1 Container Oral TID BM  . ferrous sulfate  325 mg Oral Q breakfast  . furosemide  40 mg Oral Daily  . levothyroxine  37.5 mcg Oral QAC breakfast  . lubiprostone  24 mcg Oral QPM  . metoprolol  75 mg Oral TID  . multivitamin  1 tablet Oral BID  . oxyCODONE-acetaminophen  1-2 tablet Oral Q6H  . potassium chloride  20 mEq Oral Daily  . rosuvastatin  5 mg Oral QPM  . senna-docusate  1 tablet Oral BID  . sodium chloride flush  3 mL Intravenous Q12H      OBJECTIVE: Physical Exam: Vitals:   11/23/15 1830 11/23/15 2125 11/23/15 2129 11/24/15 0535  BP: 101/63 (!) 116/50 115/78 119/70  Pulse: 73 84 61 74  Resp:   20 18  Temp:   97.8 F (36.6 C) 97.5 F (36.4 C)  TempSrc:   Oral   SpO2:   98% 100%  Weight:    74 lb 12.8 oz (33.9 kg)  Height:        Intake/Output Summary (Last 24 hours) at 11/24/15 0843 Last data filed at 11/23/15 2200  Gross per 24 hour  Intake              290 ml  Output              651 ml  Net             -361 ml    Telemetry reveals AFib this morning 110-120bpm  GEN- The patient is well appearing, in NAD, alert and oriented x 3 today.   Head- normocephalic, atraumatic Eyes-  Sclera clear, conjunctiva pink Ears- hearing intact Oropharynx- clear Neck- supple, no JVP Lungs- Clear to ausculation bilaterally, normal work of breathing Heart- IRRR, tachycardic, no significant murmurs, no rubs or gallops GI- soft, NT, ND Extremities- no clubbing, cyanosis, or edema Skin- no rash or lesion Psych- euthymic mood, full affect Neuro- no gross deficits appreciated  PPM site dressing is dry, no hematoma  LABS: Basic Metabolic  Panel:  Recent Labs  11/23/15 0241 11/24/15 0253  NA 134* 134*  K 4.0 3.2*  CL 89* 88*  CO2 39* 35*  GLUCOSE 92 77  BUN 14 16  CREATININE 0.82 0.74  CALCIUM 8.9 8.7*   CBC:  Recent Labs  11/23/15 0241 11/24/15 0253  WBC 8.2 12.2*  HGB 11.1* 11.1*  HCT 40.1 40.0  MCV 74.4* 73.9*  PLT 186 167    RADIOLOGY: Dg Chest 2 View Result Date: 11/24/2015 CLINICAL DATA:  Pacemaker placement. EXAM: CHEST  2 VIEW COMPARISON:  11/16/2015 FINDINGS: AP and lateral views of the chest show a new left-sided single lead permanent pacemaker. No evidence for pneumothorax. Lungs are hyperexpanded. No pulmonary edema. Small right pleural effusion seen previously has resolved in the interval. There is a residual small left pleural effusion on today's exam. Cardiopericardial silhouette is at upper limits of normal for size. Patient is status post CABG. Telemetry leads overlie the chest. IMPRESSION: No evidence for left-sided pneumothorax status post permanent pacemaker placement. Emphysema. Small left pleural effusion.  Electronically Signed   By: Kennith CenterEric  Mansell M.D.   On: 11/24/2015 07:38    ASSESSMENT AND PLAN:   1.  Permanent atrial fibrillation with tachy/brady syndrome The patient has had recurrent admissions for AF with RVR. Medical therapy is limited by bradycardia and hypotension. She has had previous amio and digoxin toxicity.   Continue Eliquis long term for CHADS2VASC of at least 7  S/p PPM implant yesterday with Dr. Ladona Ridgelaylor Device check this morning with intact function CXR this morning without pneumothorax F/u wound check and office visit have been arranged Wound care instructions and activity restrictions discussed with the patient/family at bedside HR control/management deferred to her attending primary cardiology service  OK to remove outside pacer dressing upon discharge  2. CAD s/p CABG No recent ischemic symptoms Continue medical therapy   3.  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  Failure to thrive May benefit from palliative care consult to discuss goals of care  Francis DowseRenee Ursuy, PA-C 11/24/2015 8:43 AM  EP Attending  Patient seen and examined. Agree with above. Discussed with Dr. Herbie BaltimoreHarding. While her VR is not ideal this morning, it had been well controlled. I would think she could be safely discharged home.She will followup in our device clinic in 2 weeks.   Lewayne BuntingGregg Porfiria Heinrich, M.D.

## 2015-11-24 NOTE — Progress Notes (Signed)
Ardena B Villena to be D/C'd Home per MD order. Discussed with the patient and all questions fully answered.    Medication List    STOP taking these medications   diltiazem 120 MG 24 hr capsule Commonly known as:  CARDIZEM CD     TAKE these medications   AMITIZA 24 MCG capsule Generic drug:  lubiprostone Take 24 mcg by mouth every evening.   apixaban 5 MG Tabs tablet Commonly known as:  ELIQUIS Take 1 tablet (5 mg total) by mouth 2 (two) times daily.   Digoxin 62.5 MCG Tabs Take 0.0625 mg by mouth daily. Start taking on:  11/25/2015   feeding supplement Liqd Take 1 Container by mouth 3 (three) times daily between meals. What changed:  Another medication with the same name was added. Make sure you understand how and when to take each.   feeding supplement Liqd Take 237 mLs by mouth 3 (three) times daily with meals. What changed:  You were already taking a medication with the same name, and this prescription was added. Make sure you understand how and when to take each.   ferrous sulfate 325 (65 FE) MG tablet Take 1 tablet (325 mg total) by mouth daily with breakfast.   furosemide 20 MG tablet Commonly known as:  LASIX Take 1 tablet (20 mg total) by mouth every other day. Take extra dose if having fluid retention, or  leg edema What changed:  additional instructions   levothyroxine 75 MCG tablet Commonly known as:  SYNTHROID, LEVOTHROID Take 0.5 tablets (37.5 mcg total) by mouth daily before breakfast. What changed:  medication strength  how much to take   Metoprolol Tartrate 75 MG Tabs Take 75 mg by mouth 3 (three) times daily. What changed:  See the new instructions.   oxyCODONE-acetaminophen 5-325 MG tablet Commonly known as:  PERCOCET/ROXICET Take 1 tablet by mouth every 6 (six) hours as needed for moderate pain.   potassium chloride 10 MEQ tablet Commonly known as:  K-DUR Take 1 tablet (10 mEq total) by mouth daily.   PRESERVISION AREDS 2 Caps Take 1 capsule  by mouth 2 (two) times daily.   rosuvastatin 5 MG tablet Commonly known as:  CRESTOR TAKE 1 TABLET BY MOUTH IN THE EVENING   senna-docusate 8.6-50 MG tablet Commonly known as:  Senokot-S Take 1 tablet by mouth at bedtime as needed for mild constipation.   Vitamin D3 2000 units Tabs Take 1 tablet by mouth daily.       VVS, Skin clean, dry and intact without evidence of skin break down, no evidence of skin tears noted.  IV catheter discontinued intact. Site without signs and symptoms of complications. Dressing and pressure applied.  An After Visit Summary was printed and given to the patient.  Patient escorted via WC, and D/C home via private auto.  Kai LevinsJacobs, Kasir Hallenbeck N  11/24/2015 6:43 PM

## 2015-11-27 ENCOUNTER — Telehealth: Payer: Self-pay | Admitting: Physician Assistant

## 2015-11-27 NOTE — Telephone Encounter (Signed)
The patient's daughter was calling because she was helping her mother and noticed a large bruise extending from her armpit down almost to her elbow. She was concerned about it.  The daughter states there is no hematoma at the site. Her mother is not having any unusual pain. She has not noticed a bruise before so does not know if it is getting larger or smaller. Her mother was not aware of it. There is no hematoma in the area, only ecchymosis.  Advised her to mark the area. The daughter believes that the edges are starting to turn greenish, indicating it is resolving. Advised her to keep an eye on it and if it continues to spread, call us back. If it is resolving no further workup is needed. The patient is not having any symptoms in is not in any pain. The family is in agreement with this plan.

## 2015-12-02 ENCOUNTER — Ambulatory Visit (INDEPENDENT_AMBULATORY_CARE_PROVIDER_SITE_OTHER): Payer: Medicare HMO | Admitting: *Deleted

## 2015-12-02 VITALS — BP 98/64

## 2015-12-02 DIAGNOSIS — I482 Chronic atrial fibrillation: Secondary | ICD-10-CM

## 2015-12-02 DIAGNOSIS — Z950 Presence of cardiac pacemaker: Secondary | ICD-10-CM

## 2015-12-02 DIAGNOSIS — I495 Sick sinus syndrome: Secondary | ICD-10-CM

## 2015-12-02 DIAGNOSIS — I4821 Permanent atrial fibrillation: Secondary | ICD-10-CM

## 2015-12-02 LAB — CUP PACEART INCLINIC DEVICE CHECK
Battery Impedance: 106 Ohm
Battery Remaining Longevity: 128 mo
Battery Voltage: 2.78 V
Brady Statistic RV Percent Paced: 29 %
Date Time Interrogation Session: 20170831180956
Implantable Lead Implant Date: 20170822
Implantable Lead Model: 5076
Lead Channel Setting Pacing Amplitude: 3.5 V
Lead Channel Setting Pacing Pulse Width: 0.4 ms
Lead Channel Setting Sensing Sensitivity: 2 mV
MDC IDC LEAD LOCATION: 753860
MDC IDC MSMT LEADCHNL RA IMPEDANCE VALUE: 0 Ohm
MDC IDC MSMT LEADCHNL RV IMPEDANCE VALUE: 761 Ohm
MDC IDC MSMT LEADCHNL RV PACING THRESHOLD AMPLITUDE: 0.75 V
MDC IDC MSMT LEADCHNL RV PACING THRESHOLD PULSEWIDTH: 0.4 ms
MDC IDC MSMT LEADCHNL RV SENSING INTR AMPL: 5.6 mV

## 2015-12-02 NOTE — Progress Notes (Signed)
Wound check appointment. Steri-strips removed. Small hematoma present over site, soft to palpation. Ecchymosis extending to L shoulder and under arm, yellow to purplish in appearance. Incision edges approximated and well healed. Patient and grandson decline to hold Eliquis, despite recommendation from GT, as pt previously had CVA while Eliquis on hold. Pt and grandson agreeable to ROV in 7 days for reassessment; aware to call with signs/symptoms of infection or worsening bleeding in the interim.  Normal device function. Thresholds, sensing, and impedances consistent with implant measurements. Device programmed at 3.5V with autocapture on for extra safety margin until 3 month visit. Histogram distribution indicates poorly controlled rates. Permanent AF, +Eliquis. B/P 98/64 today, diltiazem stopped during recent admission due to hypotension. Discussed briefly with GT, patient to f/u with RU next week for reassessment. 167 high ventricular rates noted--available EGMs/markers suggest AF w/RVR, longest ~232min. Patient educated about wound care, arm mobility, lifting restrictions. ROV with RU on 12/09/15 and with GT on 02/22/16.

## 2015-12-02 NOTE — Patient Instructions (Signed)
Call the Device Clinic at [phone] if you develop any signs/symptoms of infection or bleeding, including drainage, fever, or chills before your appointment next week.

## 2015-12-03 ENCOUNTER — Encounter: Payer: Medicare HMO | Admitting: Nurse Practitioner

## 2015-12-07 ENCOUNTER — Encounter: Payer: Self-pay | Admitting: Physician Assistant

## 2015-12-09 ENCOUNTER — Ambulatory Visit (INDEPENDENT_AMBULATORY_CARE_PROVIDER_SITE_OTHER): Payer: Medicare HMO | Admitting: Physician Assistant

## 2015-12-09 ENCOUNTER — Encounter: Payer: Self-pay | Admitting: Physician Assistant

## 2015-12-09 VITALS — BP 110/74 | HR 92 | Ht 60.0 in | Wt 81.0 lb

## 2015-12-09 DIAGNOSIS — I5033 Acute on chronic diastolic (congestive) heart failure: Secondary | ICD-10-CM | POA: Diagnosis not present

## 2015-12-09 DIAGNOSIS — T148 Other injury of unspecified body region: Secondary | ICD-10-CM

## 2015-12-09 DIAGNOSIS — I4821 Permanent atrial fibrillation: Secondary | ICD-10-CM

## 2015-12-09 DIAGNOSIS — I2510 Atherosclerotic heart disease of native coronary artery without angina pectoris: Secondary | ICD-10-CM

## 2015-12-09 DIAGNOSIS — Z79899 Other long term (current) drug therapy: Secondary | ICD-10-CM

## 2015-12-09 DIAGNOSIS — I495 Sick sinus syndrome: Secondary | ICD-10-CM

## 2015-12-09 DIAGNOSIS — T148XXA Other injury of unspecified body region, initial encounter: Secondary | ICD-10-CM

## 2015-12-09 DIAGNOSIS — I482 Chronic atrial fibrillation: Secondary | ICD-10-CM | POA: Diagnosis not present

## 2015-12-09 MED ORDER — DILTIAZEM HCL ER COATED BEADS 120 MG PO CP24: 120.0000 mg | ORAL_CAPSULE | Freq: Every day | ORAL | 4 refills | Status: DC

## 2015-12-09 NOTE — Progress Notes (Signed)
Cardiology Office Note Date:  12/09/2015  Patient ID:  [name], DOB 11/06/1940, MRN [id number] PCP:  Salli RealYun Sun, MD  Cardiologist:  Dr. Herbie BaltimoreHarding Electrophysiologist: Dr. Ladona Ridgelaylor    Chief Complaint: HR control  History of Present Illness: [name] is a 75 y.o. female with history of permanent AFib, tach/brady sx s/p PPM last month, CAD, mixed chronic CHF, CVA, COPD, HTN, CRI, comes in to the office today to be seen for Dr. Ladona Ridgelaylor.  She was observed at her pacer wound check visit to have poor HR control and comes in today to evaluate further, to be seen for dr. Ladona Ridgelaylor.  The patient is accompanied by her Grandson who she lives with.  She denies any kind of CP, occasionally feels her heart racing, infrequently will use her home O2, no dizziness,, near syncope or syncope.  She denies any subjective symptoms of fever or illness  Device information: MDT single lead PPM, implanted 11/23/15, tachy/brady syndrome, Dr. Ladona Ridgelaylor  AFib hx: Hospital notes mention hx of amio and dig toxicity, records mention dig tox may have been secondary to her taking the digoxin inappropriately   Past Medical History:  Diagnosis Date  . Anxiety   . CAD (coronary artery disease), native coronary artery 2009   75% LAD, diffuse 75-90% RCA --> Referred for CABG + MAZE;  Cardiolite 12/2013: No ischemia or Infarction  . Cervical neck pain with evidence of disc disease    with need for surgery -- November 2015  . Chronic low back pain    scoliosis & lordosis  . CKD (chronic kidney disease)   . COPD (chronic obstructive pulmonary disease) (HCC)   . Dyslipidemia, goal LDL below 70    On Crestor, followed by PCP  . Essential hypertension   . H/O ischemic left MCA stroke 11/18/2014   MRI/MRA of head: Multifocal left MCA infarct.. Also possible left ACA territory infarct -- complete occlusion of left MCA distally at M2 level. Marked left ACA attenuation.  . H/O: pneumonia   . Hypothyroidism   . Ischemic  cardiomyopathy 2012   EF ~40-45% by Echo  . Osteoarthritis of back    And neck, hands,spine  . PAD (peripheral artery disease) (HCC) 2007   s/p L Ileac A stent ; most recent Dopplers July 2012: Less than 50% reduction bilaterally. ABI 0.96 on the right 0.88 on left;;;12/27/2011   -ABI right .87 and left ABI .78  ,LEFT CIA and EIA stent normall patency, left CFA,SFA,and popliteal 0-49%; rgt proximal SFA 50-69%,rgt CIA,EIA, and CFA 0-49%  . Persistent atrial fibrillation (HCC) 04/05/2012   Now permanent A. fib s/p MAZE -- recurrence, cardioversion-converted to sinus bradycardia --> Now persistent  . Pulmonary hypertension (HCC)    Estimated PA pressure on Echo January 2017 = 59 mmHg with dilated IVC, severely dilated RA and moderate TR.  . S/P CABG x 2 2009   LIMA-LAD, SVG-RCA, with AV fistula ligation and Maze procedure  . Stroke (HCC)   . TIA (transient ischemic attack) 04/05/2015    Past Surgical History:  Procedure Laterality Date  . ABDOMINAL HYSTERECTOMY    . ANTERIOR CERVICAL DECOMP/DISCECTOMY FUSION N/A 01/23/2014   Procedure: ANTERIOR CERVICAL DECOMPRESSION/DISCECTOMY FUSION CERVICAL FOUR-FIVE,CERVICAL FIVE-SIX,CERVICAL SIX-SEVEN;  Surgeon: Mariam DollarGary P Cram, MD;  Location: [facility];  Service: Neurosurgery;  Laterality: N/A;  . CARDIAC CATHETERIZATION  07/30/2007   75% LAD ,3 stenoses of 75-90% in  RCA;circumflex from proximal RCA with no lesion seen,normal ramus intermediate branh; normal LV systoilc  function  . CARDIOVERSION  04/05/2012   Procedure: CARDIOVERSION;  Surgeon: Thurmon Fair, MD;  Location: [facility];  Service: Cardiovascular;  Laterality: N/A;  . COLONOSCOPY WITH PROPOFOL N/A 11/12/2014   Procedure: COLONOSCOPY WITH PROPOFOL;  Surgeon: Charna Elizabeth, MD;  Location: [facility];  Service: Endoscopy;  Laterality: N/A;  . CORONARY ARTERY BYPASS GRAFT  2009   LIMA-LAD, SVG-RCA (also MAZE & AV fistula ligation)  . EP IMPLANTABLE DEVICE N/A 11/23/2015   Procedure: Pacemaker  Implant;  Surgeon: Marinus Maw, MD;  Location: [facility] INVASIVE CV LAB;  Service: Cardiovascular;  Laterality: N/A;  . ILIAC ARTERY STENT Left 04/06/2005   Ex Iliac - CFA (Smart STENTS -- 7x4 in EIA, 6 x 3 CFA)   . MAZE  2009   along with CABG  . NM CARDIOLITE LTD  12/2013   Non-gated for Afib; No Ischemia or Infarction.  Marland Kitchen NM MYOVIEW LTD  May 2013   No ischemia or Infarct  . TRANSTHORACIC ECHOCARDIOGRAM  Jan 2012   EF ~40-45%, global HK; PAP ~45-50 mmHg  . TRANSTHORACIC ECHOCARDIOGRAM  Jan 2017   EF ~50%. No RWMA, Afib. Paradoxical Septal motion. no DD assessment. Mod LA dilation.  PAP ~59 mmHg & dilated IVC. Mod TR. Severe RA dilation.    Current Outpatient Prescriptions  Medication Sig Dispense Refill  . AMITIZA 24 MCG capsule Take 24 mcg by mouth every evening.     Marland Kitchen apixaban (ELIQUIS) 5 MG TABS tablet Take 1 tablet (5 mg total) by mouth 2 (two) times daily. 60 tablet 0  . Cholecalciferol (VITAMIN D3) 2000 units TABS Take 1 tablet by mouth daily.    . digoxin 62.5 MCG TABS Take 0.0625 mg by mouth daily. 30 tablet 0  . feeding supplement (BOOST / RESOURCE BREEZE) LIQD Take 1 Container by mouth 3 (three) times daily between meals. 90 Container 0  . feeding supplement (ENSURE CLINICAL STRENGTH) LIQD Take 237 mLs by mouth 3 (three) times daily with meals.    . ferrous sulfate 325 (65 FE) MG tablet Take 1 tablet (325 mg total) by mouth daily with breakfast. 30 tablet 3  . furosemide (LASIX) 20 MG tablet Take 1 tablet (20 mg total) by mouth every other day. Take extra dose if having fluid retention, or  leg edema 30 tablet 0  . levothyroxine (SYNTHROID, LEVOTHROID) 75 MCG tablet Take 0.5 tablets (37.5 mcg total) by mouth daily before breakfast. 30 tablet 0  . metoprolol 75 MG TABS Take 75 mg by mouth 3 (three) times daily. 135 tablet 0  . Multiple Vitamins-Minerals (PRESERVISION AREDS 2) CAPS Take 1 capsule by mouth 2 (two) times daily.    Marland Kitchen oxyCODONE-acetaminophen (PERCOCET/ROXICET) 5-325 MG  tablet Take 1 tablet by mouth every 6 (six) hours as needed for moderate pain.     . potassium chloride (K-DUR) 10 MEQ tablet Take 1 tablet (10 mEq total) by mouth daily. 30 tablet 0  . rosuvastatin (CRESTOR) 5 MG tablet TAKE 1 TABLET BY MOUTH IN THE EVENING 30 tablet 1  . senna-docusate (SENOKOT-S) 8.6-50 MG tablet Take 1 tablet by mouth at bedtime as needed for mild constipation. 30 tablet 0  . diltiazem (CARDIZEM CD) 120 MG 24 hr capsule Take 1 capsule (120 mg total) by mouth daily. 30 capsule 4   No current facility-administered medications for this visit.     Allergies:   Review of patient's allergies indicates no known allergies.   Social History:  The patient  reports that she quit smoking  about 17 years ago. Her smoking use included Cigarettes. She smoked 0.50 packs per day. She has never used smokeless tobacco. She reports that she does not drink alcohol or use drugs.   Family History:  The patient's family history includes Heart attack in her father and mother; Heart attack (age of onset: 75) in her son; Heart disease (age of onset: 25) in her brother; Stroke in her father, mother, and sister.  ROS:  Please see the history of present illness.  All other systems are reviewed and otherwise negative.   PHYSICAL EXAM:  VS:  BP 110/74   Pulse 92   Ht 5' (1.524 m)   Wt 81 lb (36.7 kg)   BMI 15.82 kg/m  BMI: Body mass index is 15.82 kg/m. Well nourished, very thin body habitus, in no acute distress  HEENT: normocephalic, atraumatic  Neck: no JVD, carotid bruits or masses Cardiac: IRRR; tachycardic no significant murmurs, no rubs, or gallops Lungs:  clear to auscultation bilaterally, no wheezing, rhonchi or rales  Abd: soft, nontender MS: no deformity age appropriate atrophy Ext: no edema  Skin: warm and dry, no rash Neuro:  No gross deficits appreciated Psych: euthymic mood, full affect  PPM site is stable per pt and Grandson, small area of hematoma lateral end and superior  to incision line, small area approx 1.5cm diameter of darker area/not erythematous medial incision edge, no increased temperature, incision line is well approximated, no drainage, no bleeding   EKG:  Done 11/24/15 showed  Device interrogation today shows normal function, , no changes made  11/18/15 Echocardiogram: Study Conclusions - Left ventricle: The cavity size was normal. Systolic function was mildly to moderately reduced. The estimated ejection fraction was 50%. Wall motion was normal; there were no regional wall motion abnormalities. The study was not technically sufficient to allow evaluation of LV diastolic dysfunction due to atrial fibrillation. - Ventricular septum: Septal motion showed paradox. - Aortic valve: There was no regurgitation. - Aortic root: The aortic root was normal in size. - Mitral valve: Calcified annulus. Mildly thickened leaflets . Transvalvular velocity was within the normal range. There was no evidence for stenosis. - Left atrium: The atrium was moderately dilated. - Right ventricle: The cavity size was moderately dilated. Wall thickness was normal. Systolic function was moderately reduced. - Right atrium: The atrium was severely dilated. - Tricuspid valve: There was moderate regurgitation. - Pulmonic valve: Structurally normal valve. There was mild regurgitation. - Pulmonary arteries: Systolic pressure was severely increased. PA peak pressure: 59 mm Hg (S). - Inferior vena cava: The vessel was dilated. The respirophasic diameter changes were blunted (<50%), consistent with elevated central venous pressure. - Pericardium, extracardiac: There was no pericardial effusion.  Recent Labs: 04/09/2015: ALT 10 11/16/2015: B Natriuretic Peptide 460.0 11/17/2015: TSH 2.619 11/21/2015: Magnesium 1.8 11/24/2015: BUN 16; Creatinine, Ser 0.74; Hemoglobin 11.1; Platelets 167; Potassium 3.2; Sodium 134  04/06/2015: Cholesterol 123; HDL 63; LDL  Cholesterol 47; Total CHOL/HDL Ratio 2.0; Triglycerides 64; VLDL 13   Estimated Creatinine Clearance: 34.7 mL/min (by C-G formula based on SCr of 0.8 mg/dL).   Wt Readings from Last 3 Encounters:  12/09/15 81 lb (36.7 kg)  11/24/15 74 lb 12.8 oz (33.9 kg)  09/23/15 85 lb 6.4 oz (38.7 kg)     Other studies reviewed: Additional studies/records reviewed today include: summarized above  ASSESSMENT AND PLAN:  1. Permanent AFib     CHA2DS2Vasc is at least 7 on Eliquis     Rate 98-126 here today  while on the programmer, HR by devicenote a significant amount of time 100-160     Will add Cardizem CD 120mg  daily     Will get a dig level today  2. Tachy-brady w/ PPM      Normal device function  3. CAD     no CP  4. Mixed CHF     exam is euvolemic, her weight is up a couple pounds, though they state since home her appetite has been very good and eating quite a bit     Discussed signs of fluid retention to monitor for  5. PPM site small hematoma     No findings to suggest infection     D/w Dr. Ladona Ridgel, recommends holding her Eliquis for 5-7 days, though the patient declines (again) they state she held her Eliquis once previously for a procedure and had a stroke     They are asked to monitor the site closely, if wny increase in the siae of the hematoma to hold the Eliquis and notify us  Disposition: Will schedule a 3 week device clinic visit to check her site,otherwise will see her back in 52mo, sooner if needed.     Current medicines are reviewed at length with the patient today.  The patient did not have any concerns regarding medicines.  Judith Blonder, PA-C 12/09/2015 3:04 PM     [facility] [address] [place] [address] [phone] (office)  [phone] (fax)

## 2015-12-09 NOTE — Patient Instructions (Addendum)
Medication Instructions:   START TAKING CARDIZEM 120 MG ONCE A DAY   If you need a refill on your cardiac medications before your next appointment, please call your pharmacy.  Labwork: DIGOXIN TODAY    Testing/Procedures: NONE ORDER TODAY    Follow-Up: IN 3 WEEKS WITH DEVICE CLINIC FOR A WOUND CHECK   3 MONTHS WITH RENEE URSUY    Any Other Special Instructions Will Be Listed Below (If Applicable).

## 2015-12-10 ENCOUNTER — Telehealth: Payer: Self-pay | Admitting: *Deleted

## 2015-12-10 ENCOUNTER — Telehealth: Payer: Self-pay | Admitting: Cardiology

## 2015-12-10 LAB — CUP PACEART INCLINIC DEVICE CHECK
Battery Impedance: 100 Ohm
Battery Remaining Longevity: 131 mo
Brady Statistic RV Percent Paced: 20 %
Implantable Lead Model: 5076
Lead Channel Impedance Value: 0 Ohm
Lead Channel Impedance Value: 777 Ohm
Lead Channel Setting Pacing Pulse Width: 0.4 ms
Lead Channel Setting Sensing Sensitivity: 2.8 mV
MDC IDC LEAD IMPLANT DT: 20170822
MDC IDC LEAD LOCATION: 753860
MDC IDC MSMT BATTERY VOLTAGE: 2.78 V
MDC IDC MSMT LEADCHNL RV PACING THRESHOLD AMPLITUDE: 0.5 V
MDC IDC MSMT LEADCHNL RV PACING THRESHOLD AMPLITUDE: 0.5 V
MDC IDC MSMT LEADCHNL RV PACING THRESHOLD PULSEWIDTH: 0.4 ms
MDC IDC MSMT LEADCHNL RV PACING THRESHOLD PULSEWIDTH: 0.4 ms
MDC IDC MSMT LEADCHNL RV SENSING INTR AMPL: 5.6 mV
MDC IDC SESS DTM: 20170907183625
MDC IDC SET LEADCHNL RV PACING AMPLITUDE: 3.5 V

## 2015-12-10 LAB — DIGOXIN LEVEL: Digoxin Level: 0.5 ug/L — ABNORMAL LOW (ref 0.8–2.0)

## 2015-12-10 NOTE — Telephone Encounter (Signed)
SPOKE WITH PT GRANDSON ABOUT RESULTS AND VERBALIZED UNDERSTANDING

## 2015-12-10 NOTE — Telephone Encounter (Signed)
Returning your call. °

## 2015-12-10 NOTE — Telephone Encounter (Signed)
SPOKE TO PT GRANDSON ABOUT RESULTS AND VERBALIZED UNDERSTANDING

## 2015-12-10 NOTE — Telephone Encounter (Signed)
New Message  Pt verbalized she was returning nurses call about lab results.   Please follow up.

## 2015-12-10 NOTE — Telephone Encounter (Signed)
-----   Message from Sheilah Pigeonenee Lynn Ursuy, [place]-C sent at 12/10/2015  5:31 AM EDT ----- Please let her know her lab, dioxin level is OK.  Thanks [employer]uc[employer] Corporation[employer]e

## 2015-12-10 NOTE — Telephone Encounter (Signed)
SPOKE  TO PT GRANDSON ABOUT RESULTS NOTED IN EPIC

## 2015-12-13 ENCOUNTER — Ambulatory Visit: Payer: Medicare HMO | Admitting: Cardiology

## 2015-12-15 ENCOUNTER — Encounter: Payer: Medicare HMO | Admitting: Physician Assistant

## 2015-12-17 ENCOUNTER — Ambulatory Visit (INDEPENDENT_AMBULATORY_CARE_PROVIDER_SITE_OTHER): Payer: Medicare HMO | Admitting: Cardiology

## 2015-12-17 VITALS — BP 108/74 | HR 124 | Ht 59.0 in | Wt 82.2 lb

## 2015-12-17 DIAGNOSIS — I255 Ischemic cardiomyopathy: Secondary | ICD-10-CM

## 2015-12-17 DIAGNOSIS — I495 Sick sinus syndrome: Secondary | ICD-10-CM

## 2015-12-17 DIAGNOSIS — Z951 Presence of aortocoronary bypass graft: Secondary | ICD-10-CM

## 2015-12-17 DIAGNOSIS — I482 Chronic atrial fibrillation, unspecified: Secondary | ICD-10-CM

## 2015-12-17 DIAGNOSIS — Z8673 Personal history of transient ischemic attack (TIA), and cerebral infarction without residual deficits: Secondary | ICD-10-CM | POA: Diagnosis not present

## 2015-12-17 DIAGNOSIS — I2510 Atherosclerotic heart disease of native coronary artery without angina pectoris: Secondary | ICD-10-CM | POA: Diagnosis not present

## 2015-12-17 DIAGNOSIS — I639 Cerebral infarction, unspecified: Secondary | ICD-10-CM

## 2015-12-17 DIAGNOSIS — I4891 Unspecified atrial fibrillation: Secondary | ICD-10-CM | POA: Diagnosis not present

## 2015-12-17 DIAGNOSIS — E785 Hyperlipidemia, unspecified: Secondary | ICD-10-CM

## 2015-12-17 DIAGNOSIS — N183 Chronic kidney disease, stage 3 unspecified: Secondary | ICD-10-CM

## 2015-12-17 DIAGNOSIS — I4821 Permanent atrial fibrillation: Secondary | ICD-10-CM

## 2015-12-17 DIAGNOSIS — Z7901 Long term (current) use of anticoagulants: Secondary | ICD-10-CM

## 2015-12-17 DIAGNOSIS — Z950 Presence of cardiac pacemaker: Secondary | ICD-10-CM

## 2015-12-17 DIAGNOSIS — E43 Unspecified severe protein-calorie malnutrition: Secondary | ICD-10-CM

## 2015-12-17 DIAGNOSIS — I5032 Chronic diastolic (congestive) heart failure: Secondary | ICD-10-CM

## 2015-12-17 DIAGNOSIS — I10 Essential (primary) hypertension: Secondary | ICD-10-CM

## 2015-12-17 MED ORDER — DILTIAZEM HCL ER COATED BEADS 180 MG PO CP24: 180.0000 mg | ORAL_CAPSULE | Freq: Every day | ORAL | 3 refills | Status: AC

## 2015-12-17 MED ORDER — DIGOXIN 125 MCG PO TABS: 0.1250 mg | ORAL_TABLET | ORAL | 3 refills | Status: DC

## 2015-12-17 NOTE — Patient Instructions (Addendum)
Please have labs done in 2 weeks - digoxin level -- do not take medication that day until blood work is completed. ( CAN DO WHEN YOU SEE EP DOCTOR)  Medication change: Stop diltiazem 120 mg   Start Diltiazem ER 180 mg ONE CAPSULE BY MOUTH EVERY DAY  TAKE DIGOXIN  0.125 MG TABLET  [facility] Monday [facility] AND       FRIDAYS THE OTHER DAYS TAKE DIGOXIN 0.0625 ( 1/2 TABLET) Tuesday , Thursday , Saturday AND Sunday   Your physician wants you to follow-up in: DEC 2017 WITH DR HARDING -30 MIN  If you need a refill on your cardiac medications before your next appointment, please call your pharmacy.

## 2015-12-17 NOTE — Progress Notes (Signed)
PCP: [name], MD  Clinic Note: Chief Complaint  Patient presents with  . Follow-up    See Prob List Below  . Atrial Fibrillation  . Coronary Artery Disease   1. Permanent atrial fibrillation (HCC);  CHA2DS2Vasc is at least 7 on Eliquis.     2. Tachy-brady syndrome (HCC)   3. Pacemaker   4. Atherosclerosis of native coronary artery of native heart without angina pectoris   5. S/P CABG x 2   6. History of stroke   7. CKD (chronic kidney disease) stage 3, GFR 30-59 ml/min   8. Chronic atrial fibrillation (HCC)   9. Atrial fibrillation with RVR (HCC)   10. Hyperlipidemia with target LDL less than 70   11. Ischemic cardiomyopathy   12. Essential hypertension   13. Stroke with cerebral ischemia (HCC)   14. Chronic diastolic CHF (congestive heart failure) (HCC)   15. Chronic anticoagulation   16. Severe protein-calorie malnutrition (HCC)     HPI: [name] is a 75 y.o. female with a PMH below who presents today for Hospital follow-up for A. fib with tachybradycardia syndrome. She also has known history of CAD/makes chronic CHF, CVA, COPD as well as hypertension and chronic renal insufficiency.[name] was last seen on September 7 by Edson Snowball, PA -- A&P 1. Permanent AFib     CHA2DS2Vasc is at least 7 on Eliquis     Rate 98-126 here today while on the programmer, HR by devicenote a significant amount of time 100-160     Will add Cardizem CD 120mg  daily; Will get a dig level today (0.5) 2. Tachy-brady w/ PPM-- PPM site small hematoma -- D/w Dr. Ladona Ridgel, recommends holding her Eliquis for 5-7 days, though the patient declines (again) they state she held her Eliquis once previously for a procedure and had a stroke      Normal device function.  No findings to suggest infection 3. CAD:  no CP 4. Mixed CHF:   exam is euvolemic, her weight is up a couple pounds, though they state since home her appetite has been very good and eating quite a bit.  Discussed signs of fluid  retention to monitor for     Recent Hospitalizations: Several  Hospitalized in January 2017 for TIA. I saw her after that.  Hospitalized June 2017 with pneumonia complicated by A. fib  Hospitalized on August 15 for A. fib with RVR. After long time in the hospital dealing with either RVR versus bradycardia, she had a pacemaker placed for sick sinus syndrome. She is now on high-dose beta blocker for rate control.  Studies Reviewed:   2-D Echo 11/18/2015: EF 45-50% with diffuse HK. Unable to assess diastolic function due to A. fib. Mild MR. Severely dilated left atrium. Normal RV function. Moderate TR with severely increased PA pressures (59 mmHg). Large left pleural effusion.  PPM Implant (Dr. Ladona Ridgel): Medtronic pacemaker for VVI pacing  Interval History: I'm actually seeing [name] for the first time since her hospital stay. Thankfully he actually saw her in the hospital and was well aware of her issues. Initially she was having heart failure symptoms with A. fib RVR. She was diuresed aggressively in the hospital, but then rate control became a major issue limited by hypotension. Beta blocker dose was increased, and finally we did start using digoxin. For fear of bradycardia, she ended up having a pacemaker placed. Since today actually feeling okay. She started to put back on weight (has notable protein  malnutrition). She does however note that her heart rate goes up whenever she walks around does anything and she gets short of breath and tired with it. When she saw BP, she was started on diltiazem for additional rate control, but still has fast heart rate spells. In the hospital visit decision was made against AV nodal ablation in order to ensure that she would successfully do okay with a pacemaker. If rate control becomes an issue, that would be a future option.  From a angina standpoint, she really denies any chest tightness or pressure with rest or exertion. She does feel unusual sensation in her  chest when she is in a rapid A. fib but does not have any angina. Is getting dyspneic when her heart rate was fast, but at baseline she is not having any PND orthopnea or edema.  Thankfully, with her rapid heartbeats, she has not had really any significant syncope or near-syncopal type symptoms. No dizziness etc.  No TIA/amaurosis fugax symptoms. No melena, hematochezia, hematuria, or epstaxis. No claudication.  ROS: A comprehensive was performed. Review of Systems  Constitutional: Negative for chills, fever, malaise/fatigue (Actually energy is getting better) and weight loss (Slowly, putting back on weight.).  HENT: Negative for nosebleeds.   Respiratory: Positive for shortness of breath (With rapid heart rate). Negative for cough and wheezing.   Cardiovascular: Negative for leg swelling.       Per history of present illness  Gastrointestinal: Negative for blood in stool, constipation, heartburn and melena.  Genitourinary: Negative for hematuria.  Musculoskeletal: Negative for joint pain and myalgias.  Neurological: Positive for weakness (Generalized weakness, partly related to her malnutrition.). Negative for dizziness, focal weakness and headaches.  Endo/Heme/Allergies: Does not bruise/bleed easily.  Psychiatric/Behavioral: Negative for depression. The patient has insomnia (Still having issues with sleeping).   All other systems reviewed and are negative.   Past Medical History:  Diagnosis Date  . Anxiety   . CAD (coronary artery disease), native coronary artery 2009   75% LAD, diffuse 75-90% RCA --> Referred for CABG + MAZE;  Cardiolite 12/2013: No ischemia or Infarction  . Cervical neck pain with evidence of disc disease    with need for surgery -- November 2015  . Chronic low back pain    scoliosis & lordosis  . CKD (chronic kidney disease)   . COPD (chronic obstructive pulmonary disease) (HCC)   . Dyslipidemia, goal LDL below 70    On Crestor, followed by PCP  . Essential  hypertension   . H/O ischemic left MCA stroke 11/18/2014   MRI/MRA of head: Multifocal left MCA infarct.. Also possible left ACA territory infarct -- complete occlusion of left MCA distally at M2 level. Marked left ACA attenuation.  . H/O: pneumonia   . Hypothyroidism   . Ischemic cardiomyopathy 2012   EF ~40-45% by Echo  . Osteoarthritis of back    And neck, hands,spine  . PAD (peripheral artery disease) (HCC) 2007   s/p L Ileac A stent ; most recent Dopplers July 2012: Less than 50% reduction bilaterally. ABI 0.96 on the right 0.88 on left;;;12/27/2011   -ABI right .87 and left ABI .78  ,LEFT CIA and EIA stent normall patency, left CFA,SFA,and popliteal 0-49%; rgt proximal SFA 50-69%,rgt CIA,EIA, and CFA 0-49%  . Persistent atrial fibrillation (HCC) 04/05/2012   Now permanent A. fib s/p MAZE -- recurrence, cardioversion-converted to sinus bradycardia --> Now persistent  . Pulmonary hypertension (HCC)    Estimated PA pressure on Echo January 2017 =  59 mmHg with dilated IVC, severely dilated RA and moderate TR.  . S/P CABG x 2 2009   LIMA-LAD, SVG-RCA, with AV fistula ligation and Maze procedure  . Stroke (HCC)   . TIA (transient ischemic attack) 04/05/2015    Past Surgical History:  Procedure Laterality Date  . ABDOMINAL HYSTERECTOMY    . ANTERIOR CERVICAL DECOMP/DISCECTOMY FUSION N/A 01/23/2014   Procedure: ANTERIOR CERVICAL DECOMPRESSION/DISCECTOMY FUSION CERVICAL FOUR-FIVE,CERVICAL FIVE-SIX,CERVICAL SIX-SEVEN;  Surgeon: Mariam Dollar, MD;  Location: [facility];  Service: Neurosurgery;  Laterality: N/A;  . CARDIAC CATHETERIZATION  07/30/2007   75% LAD ,3 stenoses of 75-90% in  RCA;circumflex from proximal RCA with no lesion seen,normal ramus intermediate branh; normal LV systoilc function  . CARDIOVERSION  04/05/2012   Procedure: CARDIOVERSION;  Surgeon: Thurmon Fair, MD;  Location: [facility];  Service: Cardiovascular;  Laterality: N/A;  . COLONOSCOPY WITH PROPOFOL N/A 11/12/2014    Procedure: COLONOSCOPY WITH PROPOFOL;  Surgeon: Charna Elizabeth, MD;  Location: [facility];  Service: Endoscopy;  Laterality: N/A;  . CORONARY ARTERY BYPASS GRAFT  2009   LIMA-LAD, SVG-RCA (also MAZE & AV fistula ligation)  . EP IMPLANTABLE DEVICE N/A 11/23/2015   Procedure: Pacemaker Implant;  Surgeon: Marinus Maw, MD;  Location: [facility] INVASIVE CV LAB;  Service: Cardiovascular;  Laterality: N/A;  . ILIAC ARTERY STENT Left 04/06/2005   Ex Iliac - CFA (Smart STENTS -- 7x4 in EIA, 6 x 3 CFA)   . MAZE  2009   along with CABG  . NM CARDIOLITE LTD  12/2013   Non-gated for Afib; No Ischemia or Infarction.  Marland Kitchen NM MYOVIEW LTD  May 2013   No ischemia or Infarct  . TRANSTHORACIC ECHOCARDIOGRAM  Jan 2012   EF ~40-45%, global HK; PAP ~45-50 mmHg  . TRANSTHORACIC ECHOCARDIOGRAM  Jan 2017   EF ~50%. No RWMA, Afib. Paradoxical Septal motion. no DD assessment. Mod LA dilation.  PAP ~59 mmHg & dilated IVC. Mod TR. Severe RA dilation.    Prior to Admission medications   Medication Sig Start Date End Date Taking? Authorizing Provider  AMITIZA 24 MCG capsule Take 24 mcg by mouth every evening.  03/17/15  Yes Historical Provider, MD  apixaban (ELIQUIS) 5 MG TABS tablet Take 1 tablet (5 mg total) by mouth 2 (two) times daily. 11/19/14  Yes Joseph Art, DO  Cholecalciferol (VITAMIN D3) 2000 units TABS Take 1 tablet by mouth daily.   Yes Historical Provider, MD  digoxin 62.5 MCG TABS Take 0.0625 mg by mouth daily. 11/25/15  Yes Starleen Arms, MD  feeding supplement (BOOST / RESOURCE BREEZE) LIQD Take 1 Container by mouth 3 (three) times daily between meals. 09/23/15  Yes Darreld Mclean, MD  feeding supplement (ENSURE CLINICAL STRENGTH) LIQD Take 237 mLs by mouth 3 (three) times daily with meals. 11/24/15  Yes Starleen Arms, MD  ferrous sulfate 325 (65 FE) MG tablet Take 1 tablet (325 mg total) by mouth daily with breakfast. 11/24/15  Yes Starleen Arms, MD  furosemide (LASIX) 20 MG tablet Take 1 tablet (20  mg total) by mouth every other day. Take extra dose if having fluid retention, or  leg edema 11/24/15  Yes Starleen Arms, MD  levothyroxine (SYNTHROID, LEVOTHROID) 75 MCG tablet Take 0.5 tablets (37.5 mcg total) by mouth daily before breakfast. 11/24/15  Yes Starleen Arms, MD  metoprolol 75 MG TABS Take 75 mg by mouth 3 (three) times daily. 11/24/15  Yes Starleen Arms, MD  Multiple  Vitamins-Minerals (PRESERVISION AREDS 2) CAPS Take 1 capsule by mouth 2 (two) times daily.   Yes Historical Provider, MD  oxyCODONE-acetaminophen (PERCOCET) 7.5-325 MG tablet Take 1 tablet by mouth every 4 (four) hours as needed for severe pain.   Yes Historical Provider, MD  potassium chloride (K-DUR) 10 MEQ tablet Take 1 tablet (10 mEq total) by mouth daily. 11/24/15  Yes Leana Roe Elgergawy, MD  rosuvastatin (CRESTOR) 5 MG tablet TAKE 1 TABLET BY MOUTH IN THE EVENING 10/19/15  Yes Marykay Lex, MD  senna-docusate (SENOKOT-S) 8.6-50 MG tablet Take 1 tablet by mouth at bedtime as needed for mild constipation. 04/09/15  Yes Richarda Overlie, MD  digoxin (LANOXIN) 0.125 MG tablet Take 1 tablet (0.125 mg total) by mouth every Monday, Wednesday, and Friday. This in addition to previous dose of .0625 mg 12/17/15   Marykay Lex, MD  diltiazem (CARDIZEM CD) 180 MG 24 hr capsule Take 1 capsule (180 mg total) by mouth daily. 12/17/15 03/16/16  Marykay Lex, MD    Social History   Social History  . Marital status: Widowed    Spouse name: N/A  . Number of children: N/A  . Years of education: N/A   Social History Main Topics  . Smoking status: Former Smoker    Packs/day: 0.50    Types: Cigarettes    Quit date: 01/07/1998  . Smokeless tobacco: Never Used  . Alcohol use No  . Drug use: No  . Sexual activity: Not Asked   Other Topics Concern  . None   Social History Narrative    She is a divorced mother of 1 and one child who died. Her daughter is here with her   today. She has got 4 grandchildren. She is a  native of [place] and only has an occasional alcoholic   beverage. She does not get routine exercise mostly because of her spine pain. She did previously smoke but quit many years ago.     Family History  Problem Relation Age of Onset  . Heart attack Mother     Late 71s  . Stroke Mother   . Stroke Father   . Heart attack Father     7s  . Heart disease Brother 60    Cardiomegaly  . Stroke Sister   . Heart attack Son 38    Wt Readings from Last 3 Encounters:  12/17/15 82 lb 3.2 oz (37.3 kg)  12/09/15 81 lb (36.7 kg)  11/24/15 74 lb 12.8 oz (33.9 kg)    PHYSICAL EXAM BP 108/74   Pulse (!) 124   Ht 4\' 11"  (1.499 m)   Wt 82 lb 3.2 oz (37.3 kg)   BMI 16.60 kg/m  General appearance: Very thin, frail appearing woman who appears even older than her stated age. She is gaunt, but has put on weight very pleasant mood and affect. Feisty as ever. Neck: no adenopathy, no carotid bruit and cannon A waves with elevated JVD. Lungs: clear to auscultation bilaterally, normal percussion bilaterally and non-labored Heart: Tachycardia. Irregularly irregular rate and rhythm. Normal S1 and S2. Roughly 2 out of 6 HSM at left lower sternal border. No rubs or gallops. Hyperdynamic precordium, mostly because of lack of muscle mass. -- Left chest pacemaker site is clean dry and intact. No significant hematoma left Abdomen: soft, non-tender; bowel sounds normal; no masses,  no organomegaly;  Extremities: extremities normal, atraumatic, no cyanosis, or edema  Pulses: Palpable 2+ pulses in upper extremities but somewhat thready distal pulses  and motion is. Skin: Diffuse upper low extremity mild ecchymoses/bruising Neurologic: Mental status: Alert, oriented, thought content appropriate Cranial nerves: normal (II-XII grossly intact)    Adult ECG Report  Rate: 124 ;  Rhythm: atrial fibrillation and With RVR; inc RBBB  Narrative Interpretation: Stable EKG but HR higher  Other studies  Reviewed: Additional studies/ records that were reviewed today include:  Recent Labs:     Chemistry      Component Value Date/Time   NA 134 (L) 11/24/2015 0253   K 3.2 (L) 11/24/2015 0253   CL 88 (L) 11/24/2015 0253   CO2 35 (H) 11/24/2015 0253   BUN 16 11/24/2015 0253   CREATININE 0.74 11/24/2015 0253      Component Value Date/Time   CALCIUM 8.7 (L) 11/24/2015 0253   ALKPHOS 57 04/09/2015 0330   AST 19 04/09/2015 0330   ALT 10 (L) 04/09/2015 0330   BILITOT 0.8 04/09/2015 0330      ASSESSMENT / PLAN: Problem List Items Addressed This Visit    Tachy-brady syndrome (HCC) (Chronic)    Status post pacemaker placement. At this point, the optic is try to achieve improved rate control. She will take amiodarone, because this makes her even more nauseated with poor by mouth intake. Will defer choice of any antiarrhythmic versus AV nodal blockade to Dr. Ladona Ridgel.      Relevant Medications   digoxin (LANOXIN) 0.125 MG tablet   diltiazem (CARDIZEM CD) 180 MG 24 hr capsule   Other Relevant Orders   EKG 12-Lead   Digoxin level   Stroke with cerebral ischemia (HCC) (Chronic)   Relevant Medications   digoxin (LANOXIN) 0.125 MG tablet   diltiazem (CARDIZEM CD) 180 MG 24 hr capsule   Severe protein-calorie malnutrition (HCC) (Chronic)    Thankfully, she is started back on weight. She does not do well with meal supplements because they're suite. I recommended [employer] Breakfast or smoothies. She liked the idea of smoothies with yogurt and fruit. I recommend that she supplements just but every meal.       S/P CABG x 2 (Chronic)   Relevant Orders   Digoxin level   Permanent atrial fibrillation (HCC);  CHA2DS2Vasc is at least 7 on Eliquis.   - Primary (Chronic)    Continues to have tachycardia spells. This has been an issue, and we'll continue to be an issue, but it least she now has little more blood pressure to work with and has the backup pacemaker.  Plan for now is to have her  double up her digoxin dose on Monday Wednesday and Fridays (last his level was only 0.5). Also will increase from 120-180 mg diltiazem. Continue Lopressor 75 mg tid On Eliquis  Continue to follow BP. If rate control needs to be difficult, I suspect we may need to consider AV nodal ablation.      Relevant Medications   digoxin (LANOXIN) 0.125 MG tablet   diltiazem (CARDIZEM CD) 180 MG 24 hr capsule   Other Relevant Orders   EKG 12-Lead   Digoxin level   Pacemaker (Chronic)    Followed by EP.      Relevant Orders   EKG 12-Lead   Digoxin level   Ischemic cardiomyopathy (Chronic)    I worry about the her persistently elevated heart rates some with her mildly reduced EF. I'm concerned that A. fib will lead to worsening EF overall. She certainly does not tolerate persistent rapid heart rates with CHF exacerbations. She is on a standing dose  of Lasix 20 mg a day and also when necessary      Relevant Medications   digoxin (LANOXIN) 0.125 MG tablet   diltiazem (CARDIZEM CD) 180 MG 24 hr capsule   Hyperlipidemia with target LDL less than 70 (Chronic)    Remains on low-dose statin. Labs followed by PCP. At this time with her malnutrition, I don't think this is a major issue. She just simply needs to put on weight.      Relevant Medications   digoxin (LANOXIN) 0.125 MG tablet   diltiazem (CARDIZEM CD) 180 MG 24 hr capsule   History of stroke   Relevant Orders   EKG 12-Lead   Digoxin level   Essential hypertension (Chronic)    This has not an issue, in fact she's been hypotensive with trying to increase rate control agents.      Relevant Medications   digoxin (LANOXIN) 0.125 MG tablet   diltiazem (CARDIZEM CD) 180 MG 24 hr capsule   CKD (chronic kidney disease) stage 3, GFR 30-59 ml/min   Relevant Orders   EKG 12-Lead   Digoxin level   Chronic diastolic CHF (congestive heart failure) (HCC) (Chronic)    Really exacerbated by A. fib RVR. Her last hospitalization required  significant diuresis, then I think we may been gone a bit over board.  Continue current low-dose Lasix. Despite having a reduced EF of think we have to use diltiazem in addition to beta blocker. Using shorter acting beta blocker for better rate control.      Relevant Medications   digoxin (LANOXIN) 0.125 MG tablet   diltiazem (CARDIZEM CD) 180 MG 24 hr capsule   Chronic atrial fibrillation (HCC) (Chronic)    Continues to have tachycardia spells. This has been an issue, and we'll continue to be an issue, but it least she now has little more blood pressure to work with and has the backup pacemaker.  Plan for now is to have her double up her digoxin dose on Monday Wednesday and Fridays (last his level was only 0.5). Also will increase from 120-180 mg diltiazem.  Continue to follow BP. If rate control needs to be difficult, I suspect we may need to consider AV nodal ablation.      Relevant Medications   digoxin (LANOXIN) 0.125 MG tablet   diltiazem (CARDIZEM CD) 180 MG 24 hr capsule   Chronic anticoagulation   Atrial fibrillation with RVR (HCC) (Chronic)    Increasing CCB & digoxin dose for more rate control      Relevant Medications   digoxin (LANOXIN) 0.125 MG tablet   diltiazem (CARDIZEM CD) 180 MG 24 hr capsule   2 Vessel CAD - s/p CABG; LIMA-LAD, SVG-RCA (Chronic)    Table without angina symptoms despite having rapid heartbeats. She has mild moderately reduced ejection fraction but with no specific regional wall motion and amount is. Negative Myoview in 2015. With anything else going on, I inclined to hold off on any further evaluation unless she has symptoms.  Plan: Continue beta blocker and statin. Not on aspirin due to Eliquis.      Relevant Medications   digoxin (LANOXIN) 0.125 MG tablet   diltiazem (CARDIZEM CD) 180 MG 24 hr capsule   Other Relevant Orders   EKG 12-Lead   Digoxin level    Other Visit Diagnoses   None.     Current medicines are reviewed at length  with the patient today. (+/- concerns) none The following changes have been made:   Medication change:  Stop diltiazem 120 mg   Start Diltiazem ER 180 mg ONE CAPSULE BY MOUTH EVERY DAY  TAKE DIGOXIN  0.125 MG TABLET  BY MOUTH ON Monday ,[facility] AND       FRIDAYS THE OTHER DAYS TAKE DIGOXIN 0.0625 ( 1/2 TABLET) Tuesday , Thursday , Saturday AND Sunday  Please have labs done in 2 weeks - digoxin level -- do not take medication that day until blood work is completed. ( CAN DO WHEN YOU SEE EP DOCTOR)  Your physician wants you to follow-up in: DEC 2017 WITH DR Cayton Cuevas -30 MIN  Studies Ordered:   Orders Placed This Encounter  Procedures  . Digoxin level  . EKG 12-Lead   [name] As extremely long problem list in the very complicated. Multiple different points of decision-making required. I spent close to 45 minutes and directly with the patient. Greater than 50% of time was spent in direct consultation.    Bryan Lemma, M.D., M.S. Interventional Cardiologist   Pager # [phone] Phone # [phone] [address] [place], [place] [address]

## 2015-12-19 ENCOUNTER — Encounter: Payer: Self-pay | Admitting: Cardiology

## 2015-12-19 NOTE — Assessment & Plan Note (Signed)
Status post pacemaker placement. At this point, the optic is try to achieve improved rate control. She will take amiodarone, because this makes her even more nauseated with poor by mouth intake. Will defer choice of any antiarrhythmic versus AV nodal blockade to Dr. Ladona Ridgelaylor.

## 2015-12-19 NOTE — Assessment & Plan Note (Signed)
I worry about the her persistently elevated heart rates some with her mildly reduced EF. I'm concerned that A. fib will lead to worsening EF overall. She certainly does not tolerate persistent rapid heart rates with CHF exacerbations. She is on a standing dose of Lasix 20 mg a day and also when necessary

## 2015-12-19 NOTE — Assessment & Plan Note (Signed)
Really exacerbated by A. fib RVR. Her last hospitalization required significant diuresis, then I think we may been gone a bit over board.  Continue current low-dose Lasix. Despite having a reduced EF of think we have to use diltiazem in addition to beta blocker. Using shorter acting beta blocker for better rate control.

## 2015-12-19 NOTE — Assessment & Plan Note (Signed)
Followed by EP 

## 2015-12-19 NOTE — Assessment & Plan Note (Signed)
Thankfully, she is started back on weight. She does not do well with meal supplements because they're suite. I recommended [employer] Breakfast or smoothies. She liked the idea of smoothies with yogurt and fruit. I recommend that she supplements just but every meal.

## 2015-12-19 NOTE — Assessment & Plan Note (Signed)
Table without angina symptoms despite having rapid heartbeats. She has mild moderately reduced ejection fraction but with no specific regional wall motion and amount is. Negative Myoview in 2015. With anything else going on, I inclined to hold off on any further evaluation unless she has symptoms.  Plan: Continue beta blocker and statin. Not on aspirin due to Eliquis.

## 2015-12-19 NOTE — Assessment & Plan Note (Signed)
Increasing CCB & digoxin dose for more rate control

## 2015-12-19 NOTE — Assessment & Plan Note (Signed)
Remains on low-dose statin. Labs followed by PCP. At this time with her malnutrition, I don't think this is a major issue. She just simply needs to put on weight.

## 2015-12-19 NOTE — Assessment & Plan Note (Signed)
This has not an issue, in fact she's been hypotensive with trying to increase rate control agents.

## 2015-12-19 NOTE — Assessment & Plan Note (Signed)
Continues to have tachycardia spells. This has been an issue, and we'll continue to be an issue, but it least she now has little more blood pressure to work with and has the backup pacemaker.  Plan for now is to have her double up her digoxin dose on Monday Wednesday and Fridays (last his level was only 0.5). Also will increase from 120-180 mg diltiazem.  Continue to follow BP. If rate control needs to be difficult, I suspect we may need to consider AV nodal ablation.

## 2015-12-19 NOTE — Assessment & Plan Note (Signed)
Continues to have tachycardia spells. This has been an issue, and we'll continue to be an issue, but it least she now has little more blood pressure to work with and has the backup pacemaker.  Plan for now is to have her double up her digoxin dose on Monday Wednesday and Fridays (last his level was only 0.5). Also will increase from 120-180 mg diltiazem. Continue Lopressor 75 mg tid On Eliquis  Continue to follow BP. If rate control needs to be difficult, I suspect we may need to consider AV nodal ablation.

## 2015-12-22 ENCOUNTER — Other Ambulatory Visit: Payer: Self-pay | Admitting: Cardiology

## 2015-12-27 ENCOUNTER — Other Ambulatory Visit: Payer: Self-pay

## 2015-12-27 MED ORDER — POTASSIUM CHLORIDE ER 10 MEQ PO TBCR: 10.0000 meq | EXTENDED_RELEASE_TABLET | Freq: Every day | ORAL | 9 refills | Status: DC

## 2016-01-12 ENCOUNTER — Telehealth: Payer: Self-pay | Admitting: *Deleted

## 2016-01-12 ENCOUNTER — Other Ambulatory Visit: Payer: Self-pay

## 2016-01-12 MED ORDER — METOPROLOL TARTRATE 75 MG PO TABS: 75.0000 mg | ORAL_TABLET | Freq: Three times a day (TID) | ORAL | 0 refills | Status: DC

## 2016-01-12 MED ORDER — METOPROLOL TARTRATE 75 MG PO TABS: 75.0000 mg | ORAL_TABLET | Freq: Three times a day (TID) | ORAL | 3 refills | Status: AC

## 2016-01-12 MED ORDER — DIGOXIN 62.5 MCG PO TABS: 0.0625 mg | ORAL_TABLET | Freq: Every day | ORAL | 3 refills | Status: DC

## 2016-01-12 MED ORDER — METOPROLOL SUCCINATE ER 50 MG PO TB24: ORAL_TABLET | ORAL | 3 refills | Status: DC

## 2016-01-12 NOTE — Telephone Encounter (Signed)
Refills sent as requested

## 2016-01-12 NOTE — Addendum Note (Signed)
Addended by: Neta EhlersRUITT, Amzie Sillas M on: 01/12/2016 10:13 AM   Modules accepted: Orders

## 2016-01-17 ENCOUNTER — Ambulatory Visit: Payer: Medicare HMO | Admitting: Neurology

## 2016-01-31 ENCOUNTER — Telehealth: Payer: Self-pay | Admitting: Cardiology

## 2016-01-31 MED ORDER — LEVOTHYROXINE SODIUM 75 MCG PO TABS: 37.5000 ug | ORAL_TABLET | Freq: Every day | ORAL | 1 refills | Status: AC

## 2016-01-31 MED ORDER — POTASSIUM CHLORIDE ER 10 MEQ PO TBCR: 10.0000 meq | EXTENDED_RELEASE_TABLET | Freq: Every day | ORAL | 1 refills | Status: DC

## 2016-01-31 NOTE — Telephone Encounter (Signed)
°  New Prob    *STAT* If patient is at the pharmacy, call can be transferred to refill team.   1. Which medications need to be refilled? (please list name of each medication and dose if known)  Potassium Chloride 10 MEQ tabs Levothyroxine 75 mg  2. Which pharmacy/location (including street and city if local pharmacy) is medication to be sent [facility]? [facility] [address]  3. Do they need a 30 day or 90 day supply?  90 days or 30 days

## 2016-01-31 NOTE — Telephone Encounter (Signed)
meds refilled 

## 2016-02-09 ENCOUNTER — Other Ambulatory Visit: Payer: Self-pay | Admitting: *Deleted

## 2016-02-09 MED ORDER — DIGOXIN 62.5 MCG PO TABS: 0.0625 mg | ORAL_TABLET | Freq: Every day | ORAL | 0 refills | Status: DC

## 2016-02-22 ENCOUNTER — Encounter: Payer: Medicare HMO | Admitting: Internal Medicine

## 2016-03-08 ENCOUNTER — Encounter: Payer: Medicare HMO | Admitting: Internal Medicine

## 2016-03-14 ENCOUNTER — Telehealth: Payer: Self-pay | Admitting: Cardiology

## 2016-03-14 NOTE — Telephone Encounter (Signed)
Please call her daughter,she needs to talk to you about her medicine.She is concerned about what medicine pt is supposed to be taking.

## 2016-03-14 NOTE — Telephone Encounter (Signed)
So my last note discusses the Digoxin dosing.  I also increased the Diltiazem dose to 180 mg \ Metoprolol was 75 mg TID.  ?? This has nothing to do with bleeding. She is on Eliquis 2.5 mg BID.  Not sure what the concern is.  Bryan Lemmaavid Izell Labat, MD

## 2016-03-14 NOTE — Telephone Encounter (Signed)
Spoke with daughter she states that she is confused about her medications she was at the PCP Dr Wynelle LinkSun yesterday and he was concerned about the Digoxin and bleeding this has not been an issue so she does not know why he mentioned this. She is asking for clarification on digoxin and Metoprolol dosages. Please advise

## 2016-03-15 NOTE — Telephone Encounter (Signed)
Called left message to call back 

## 2016-03-17 NOTE — Telephone Encounter (Signed)
Pt is returning [place] call

## 2016-03-17 NOTE — Telephone Encounter (Signed)
Left msg to call.

## 2016-03-20 ENCOUNTER — Other Ambulatory Visit (HOSPITAL_COMMUNITY): Payer: Self-pay | Admitting: Orthopedic Surgery

## 2016-03-20 ENCOUNTER — Ambulatory Visit: Payer: Medicare HMO | Admitting: Cardiology

## 2016-03-20 DIAGNOSIS — S22000A Wedge compression fracture of unspecified thoracic vertebra, initial encounter for closed fracture: Secondary | ICD-10-CM

## 2016-03-20 NOTE — Telephone Encounter (Signed)
Left message for daughter-[name] @ [phone]

## 2016-03-22 ENCOUNTER — Ambulatory Visit (INDEPENDENT_AMBULATORY_CARE_PROVIDER_SITE_OTHER): Payer: Medicare HMO | Admitting: Cardiology

## 2016-03-22 VITALS — BP 114/69 | HR 75 | Ht 60.0 in | Wt 77.8 lb

## 2016-03-22 DIAGNOSIS — I5032 Chronic diastolic (congestive) heart failure: Secondary | ICD-10-CM | POA: Diagnosis not present

## 2016-03-22 DIAGNOSIS — I255 Ischemic cardiomyopathy: Secondary | ICD-10-CM

## 2016-03-22 DIAGNOSIS — E43 Unspecified severe protein-calorie malnutrition: Secondary | ICD-10-CM

## 2016-03-22 DIAGNOSIS — I482 Chronic atrial fibrillation: Secondary | ICD-10-CM

## 2016-03-22 DIAGNOSIS — R634 Abnormal weight loss: Secondary | ICD-10-CM

## 2016-03-22 DIAGNOSIS — E785 Hyperlipidemia, unspecified: Secondary | ICD-10-CM

## 2016-03-22 DIAGNOSIS — I2510 Atherosclerotic heart disease of native coronary artery without angina pectoris: Secondary | ICD-10-CM

## 2016-03-22 DIAGNOSIS — I10 Essential (primary) hypertension: Secondary | ICD-10-CM | POA: Diagnosis not present

## 2016-03-22 DIAGNOSIS — I4821 Permanent atrial fibrillation: Secondary | ICD-10-CM

## 2016-03-22 NOTE — Telephone Encounter (Signed)
Patient seen in office 03/22/16. Encounter closed.

## 2016-03-22 NOTE — Patient Instructions (Addendum)
YOU SHOULD BE TAKING  METOPROLOL TARTRATE (LOPRESSOR)  75 MG ONE TABLET THREE TIMES A DAY.  DISCONTINUE /STOPPED METOPROLOL SUCCINATE.   EAT OR DRINK ANYTHING YOU WANT TO GET CALORIES  LIKE MILK , ICE CREAM , YOGURT, BOOST ENSURE, CARNATION INSTANT BREAKFAST.    NO OTHER CHANGES   Your physician wants you to follow-up in 6 months with DR HARDING.- 30  You will receive a reminder letter in the mail two months in advance. If you don't receive a letter, please call our office to schedule the follow-up appointment.   If you need a refill on your cardiac medications before your next appointment, please call your pharmacy.

## 2016-03-22 NOTE — Progress Notes (Signed)
PCP: [name], MD  Clinic Note: Chief Complaint  Patient presents with  . Follow-up    patient reports no cardiac complaints. is having back pain.  . Coronary Artery Disease    See Problem List  . Atrial Fibrillation    Tachy-Brady - s/p PPM   1. Permanent atrial fibrillation (HCC);  CHA2DS2Vasc is at least 7 on Eliquis.     2. Tachy-brady syndrome (HCC)   3. Pacemaker   4. Atherosclerosis of native coronary artery of native heart without angina pectoris   5. S/P CABG x 2   6. History of stroke   7. CKD (chronic kidney disease) stage 3, GFR 30-59 ml/min   8. Chronic atrial fibrillation (HCC)   9. Atrial fibrillation with RVR (HCC)   10. Hyperlipidemia with target LDL less than 70   11. Ischemic cardiomyopathy   12. Essential hypertension   13. Stroke with cerebral ischemia (HCC)   14. Chronic diastolic CHF (congestive heart failure) (HCC)   15. Chronic anticoagulation   16. Severe protein-calorie malnutrition (HCC)      HPI: [name] is a 75 y.o. female with a PMH below who presents today for Three-month follow-up for her chronic atrial fibrillation with tachybradycardia syndrome as well as long-standing CAD, chronic CHF history of stroke PAD etc.  [name] was last in September 2017 as part of hospital follow-up. She finally underwent pacemaker placement for tachybradycardia syndrome. Please see long note from September 2015 for details.  Recent Hospitalizations: None since last visit  Studies Reviewed: None since last visit  Interval History: [name] actually presents today feeling okay. Her cardiovascular standpoint she is pretty stable. She is not really noticing any rapid irregular heartbeats now. She really isn't doing much, but has not had any resting or exertional dyspnea or chest pain. No PND or orthopnea, but will wake up sometimes at night startled. No edema. No further TIA or amaurosis fugax/CVA symptoms. No claudication symptoms. No syncope/near  syncope.   She is definitely having issues with chronic back pain that limits her activity. She's not doing well eating and it continues to lose weight. She also has pretty significant memory loss issues. Her eyes are really bothering her the continue to get worse - almost blind now  ROS: A comprehensive was performed. Review of Systems  Constitutional: Positive for malaise/fatigue (Better energy than before) and weight loss.       Still has poor nutrition.  HENT: Positive for hearing loss. Negative for congestion and sinus pain.   Respiratory: Negative for cough, shortness of breath and wheezing.   Cardiovascular:       Per history of present illness  Gastrointestinal: Positive for constipation. Negative for blood in stool, diarrhea, heartburn and melena.  Genitourinary: Negative for dysuria and hematuria.  Musculoskeletal: Positive for back pain and joint pain. Negative for falls and myalgias.  Neurological: Positive for dizziness (Occasional positional). Negative for loss of consciousness.  Psychiatric/Behavioral: Positive for memory loss. Negative for depression. The patient is not nervous/anxious and does not have insomnia.     Past Medical History:  Diagnosis Date  . Anxiety   . CAD (coronary artery disease), native coronary artery 2009   75% LAD, diffuse 75-90% RCA --> Referred for CABG + MAZE;  Cardiolite 12/2013: No ischemia or Infarction  . Cervical neck pain with evidence of disc disease    with need for surgery -- November 2015  . Chronic low back pain    scoliosis &  lordosis  . CKD (chronic kidney disease)   . COPD (chronic obstructive pulmonary disease) (HCC)   . Dyslipidemia, goal LDL below 70    On Crestor, followed by PCP  . Essential hypertension   . H/O ischemic left MCA stroke 11/18/2014   MRI/MRA of head: Multifocal left MCA infarct.. Also possible left ACA territory infarct -- complete occlusion of left MCA distally at M2 level. Marked left ACA attenuation.  .  H/O: pneumonia   . Hypothyroidism   . Ischemic cardiomyopathy 2012   EF ~40-45% by Echo  . Osteoarthritis of back    And neck, hands,spine  . PAD (peripheral artery disease) (HCC) 2007   s/p L Ileac A stent ; most recent Dopplers July 2012: Less than 50% reduction bilaterally. ABI 0.96 on the right 0.88 on left;;;12/27/2011   -ABI right .87 and left ABI .78  ,LEFT CIA and EIA stent normall patency, left CFA,SFA,and popliteal 0-49%; rgt proximal SFA 50-69%,rgt CIA,EIA, and CFA 0-49%  . Persistent atrial fibrillation (HCC) 04/05/2012   Now permanent A. fib s/p MAZE -- recurrence, cardioversion-converted to sinus bradycardia --> Now persistent  . Pulmonary hypertension    Estimated PA pressure on Echo January 2017 = 59 mmHg with dilated IVC, severely dilated RA and moderate TR.  . S/P CABG x 2 2009   LIMA-LAD, SVG-RCA, with AV fistula ligation and Maze procedure  . Stroke (HCC)   . TIA (transient ischemic attack) 04/05/2015    Past Surgical History:  Procedure Laterality Date  . ABDOMINAL HYSTERECTOMY    . ANTERIOR CERVICAL DECOMP/DISCECTOMY FUSION N/A 01/23/2014   Procedure: ANTERIOR CERVICAL DECOMPRESSION/DISCECTOMY FUSION CERVICAL FOUR-FIVE,CERVICAL FIVE-SIX,CERVICAL SIX-SEVEN;  Surgeon: Mariam Dollar, MD;  Location: [facility];  Service: Neurosurgery;  Laterality: N/A;  . CARDIAC CATHETERIZATION  07/30/2007   75% LAD ,3 stenoses of 75-90% in  RCA;circumflex from proximal RCA with no lesion seen,normal ramus intermediate branh; normal LV systoilc function  . CARDIOVERSION  04/05/2012   Procedure: CARDIOVERSION;  Surgeon: Thurmon Fair, MD;  Location: [facility];  Service: Cardiovascular;  Laterality: N/A;  . COLONOSCOPY WITH PROPOFOL N/A 11/12/2014   Procedure: COLONOSCOPY WITH PROPOFOL;  Surgeon: Charna Elizabeth, MD;  Location: [facility];  Service: Endoscopy;  Laterality: N/A;  . CORONARY ARTERY BYPASS GRAFT  2009   LIMA-LAD, SVG-RCA (also MAZE & AV fistula ligation)  . EP IMPLANTABLE  DEVICE N/A 11/23/2015   Procedure: Pacemaker Implant;  Surgeon: Marinus Maw, MD;  Location: [facility] IN[facility]ASIVE CV LAB;  Service: Cardiovascular;  Laterality: N/A;  . ILIAC ARTERY STENT Left 04/06/2005   Ex Iliac - CFA (Smart STENTS -- 7x4 in EIA, 6 x 3 CFA)   . MAZE  2009   along with CABG  . NM CARDIOLITE LTD  12/2013   Non-gated for Afib; No Ischemia or Infarction.  Marland Kitchen NM MYOVIEW LTD  May 2013   No ischemia or Infarct  . TRANSTHORACIC ECHOCARDIOGRAM  Jan 2012   EF ~40-45%, global HK; PAP ~45-50 mmHg  . TRANSTHORACIC ECHOCARDIOGRAM  Jan 2017   EF ~50%. No RWMA, Afib. Paradoxical Septal motion. no DD assessment. Mod LA dilation.  PAP ~59 mmHg & dilated IVC. Mod TR. Severe RA dilation.    Current Meds  Medication Sig  . AMITIZA 24 MCG capsule Take 24 mcg by mouth every evening.   Marland Kitchen apixaban (ELIQUIS) 5 MG TABS tablet Take 1 tablet (5 mg total) by mouth 2 (two) times daily.  . Cholecalciferol (VITAMIN D3) 2000 units TABS Take 1  tablet by mouth daily.  . Digoxin 62.5 MCG TABS Take 0.0625 mg by mouth daily. KEEP OV.  . feeding supplement (BOOST / RESOURCE BREEZE) LIQD Take 1 Container by mouth 3 (three) times daily between meals.  . feeding supplement (ENSURE CLINICAL STRENGTH) LIQD Take 237 mLs by mouth 3 (three) times daily with meals.  . ferrous sulfate 325 (65 FE) MG tablet Take 1 tablet (325 mg total) by mouth daily with breakfast.  . furosemide (LASIX) 20 MG tablet Take 1 tablet (20 mg total) by mouth every other day. Take extra dose if having fluid retention, or  leg edema  . levothyroxine (SYNTHROID, LEVOTHROID) 75 MCG tablet Take 0.5 tablets (37.5 mcg total) by mouth daily before breakfast.  . Metoprolol Tartrate 75 MG TABS Take 75 mg by mouth 3 (three) times daily.  . Multiple Vitamins-Minerals (PRESERVISION AREDS 2) CAPS Take 1 capsule by mouth 2 (two) times daily.  Marland Kitchen. oxyCODONE-acetaminophen (PERCOCET) 7.5-325 MG tablet Take 1 tablet by mouth every 4 (four) hours as needed for severe  pain.  . potassium chloride (K-DUR) 10 MEQ tablet Take 1 tablet (10 mEq total) by mouth daily.  . rosuvastatin (CRESTOR) 5 MG tablet TAKE 1 TABLET BY MOUTH IN THE EVENING  . senna-docusate (SENOKOT-S) 8.6-50 MG tablet Take 1 tablet by mouth at bedtime as needed for mild constipation.  . [DISCONTINUED] metoprolol succinate (TOPROL-XL) 50 MG 24 hr tablet Take 1.5 tablets (75mg )with meal.    No Known Allergies  Social History   Social History  . Marital status: Widowed    Spouse name: N/A  . Number of children: N/A  . Years of education: N/A   Social History Main Topics  . Smoking status: Former Smoker    Packs/day: 0.50    Types: Cigarettes    Quit date: 01/07/1998  . Smokeless tobacco: Never Used  . Alcohol use No  . Drug use: No  . Sexual activity: Not Asked   Other Topics Concern  . None   Social History Narrative    She is a divorced mother of 1 and one child who died. Her daughter is here with her   today. She has got 4 grandchildren. She is a native of [place] and only has an occasional alcoholic   beverage. She does not get routine exercise mostly because of her spine pain. She did previously smoke but quit many years ago.     family history includes Heart attack in her father and mother; Heart attack (age of onset: 1538) in her son; Heart disease (age of onset: 6360) in her brother; Stroke in her father, mother, and sister.  Wt Readings from Last 3 Encounters:  03/22/16 35.3 kg (77 lb 12.8 oz)  12/17/15 37.3 kg (82 lb 3.2 oz)  12/09/15 36.7 kg (81 lb)    PHYSICAL EXAM BP 114/69   Pulse 75   Ht 5' (1.524 m)   Wt 35.3 kg (77 lb 12.8 oz)   SpO2 98%   BMI 15.19 kg/m   General appearance: Very thin, frail appearing woman who appears even older than her stated age. She is gaunt, but has put on weight very pleasant mood and affect. Feisty as ever. Neck: no adenopathy, no carotid bruit and cannon A waves with elevated JVD. Lungs: clear to auscultation bilaterally,  normal percussion bilaterally and non-labored Heart: Irregularly irregular rate and rhythm. Normal S1 and S2. Roughly 2 out of 6 HSM at left lower sternal border. No rubs or gallops. Hyperdynamic precordium, mostly because  of lack of muscle mass.  Abdomen: soft, non-tender; bowel sounds normal; no masses,  no organomegaly;  Extremities: extremities normal, atraumatic, no cyanosis, or edema  Pulses: Palpable 2+ pulses in upper extremities but somewhat thready distal pulses and motion is. Skin: Diffuse upper low extremity mild ecchymoses/bruising Neurologic: Mental status: Alert, oriented, thought content appropriate    Adult ECG Report  Rate: 75 ;  Rhythm: atrial fibrillation and Controlled ventricular rate. ST and T-wave changes anterolateral leads. Occasional paced beats.;   Narrative Interpretation: Relatively stable EKG. Rate is improved.   Other studies Reviewed: Additional studies/ records that were reviewed today include:  Recent Labs:   Lab Results  Component Value Date   CREATININE 0.74 11/24/2015   BUN 16 11/24/2015   NA 134 (L) 11/24/2015   K 3.2 (L) 11/24/2015   CL 88 (L) 11/24/2015   CO2 35 (H) 11/24/2015   Lab Results  Component Value Date   CHOL 123 04/06/2015   HDL 63 04/06/2015   LDLCALC 47 04/06/2015   TRIG 64 04/06/2015   CHOLHDL 2.0 04/06/2015     ASSESSMENT / PLAN: Problem List Items Addressed This Visit    2 Vessel CAD - s/p CABG; LIMA-LAD, SVG-RCA (Chronic)    Stable. No active anginal symptoms. On beta blocker, statin. Not on aspirin or Plavix because of ELIQUIS.      Chronic diastolic CHF (congestive heart failure) (HCC) (Chronic)    Really no active symptoms at this time. Usually only exacerbated by RVR. On minimal dose of furosemide which she is using sparingly.      Permanent atrial fibrillation (HCC);  CHA2DS2Vasc is at least 7 on Eliquis.   - Primary (Chronic)    Still has tachycardia spells, but now is on a standing dose of 75 3 times a  day metoprolol tartrate and diltiazem along with digoxin. Anticoagulated with ELIQUIS.  Plan: Continue to monitor. If rate continues to be difficult, will potentially need to consider AV nodal ablation.      Hyperlipidemia with target LDL less than 70 (Chronic)    Still at goal on low-dose statin.      Essential hypertension (Chronic)    No longer really an issue on current medications.      Ischemic cardiomyopathy (Chronic)    Mild to moderately reduced EF. Would expect him to continue to get worse with persistent A. fib. He doesn't tolerate rapid heart rates. But otherwise as long as her rate is controlled and she is on low-dose Lasix, she does OK.      Weight loss (Chronic)    Just I'll restart in the get weight back on. She has lost more weight. We talked again about nutrition. Tortuous supplementing with proteinaceous, she doesn't like them. I talked about using Carnation Instant Breakfast, yogurt, ice cream.      Severe protein-calorie malnutrition (HCC) (Chronic)    EAT OR DRINK ANYTHING YOU WANT TO GET CALORIES  LIKE MILK , ICE CREAM , YOGURT, BOOST ENSURE, CARNATION INSTANT BREAKFAST         Current medicines are reviewed at length with the patient today. (+/- concerns) none The following changes have been made: none  Patient Instructions  YOU SHOULD BE TAKING  METOPROLOL TARTRATE (LOPRESSOR)  75 MG ONE TABLET THREE TIMES A DAY.  DISCONTINUE /STOPPED METOPROLOL SUCCINATE.   EAT OR DRINK ANYTHING YOU WANT TO GET CALORIES  LIKE MILK , ICE CREAM , YOGURT, BOOST ENSURE, CARNATION INSTANT BREAKFAST.    NO OTHER CHANGES  Your physician wants you to follow-up in 6 months with DR HARDING.- 30  You will receive a reminder letter in the mail two months in advance. If you don't receive a letter, please call our office to schedule the follow-up appointment.   If you need a refill on your cardiac medications before your next appointment, please call your  pharmacy.    Studies Ordered:   No orders of the defined types were placed in this encounter.     Bryan Lemma, M.D., M.S. Interventional Cardiologist   Pager # [phone] Phone # [phone] [address] [place], [place] [address]

## 2016-03-22 NOTE — Telephone Encounter (Signed)
See below

## 2016-03-24 ENCOUNTER — Encounter: Payer: Self-pay | Admitting: Cardiology

## 2016-03-24 NOTE — Assessment & Plan Note (Addendum)
Mild to moderately reduced EF. Would expect him to continue to get worse with persistent A. fib. He doesn't tolerate rapid heart rates. But otherwise as long as her rate is controlled and she is on low-dose Lasix, she does OK.

## 2016-03-24 NOTE — Assessment & Plan Note (Signed)
Really no active symptoms at this time. Usually only exacerbated by RVR. On minimal dose of furosemide which she is using sparingly.

## 2016-03-24 NOTE — Assessment & Plan Note (Signed)
Just I'll restart in the get weight back on. She has lost more weight. We talked again about nutrition. Tortuous supplementing with proteinaceous, she doesn't like them. I talked about using Carnation Instant Breakfast, yogurt, ice cream.

## 2016-03-24 NOTE — Assessment & Plan Note (Signed)
EAT OR DRINK ANYTHING YOU WANT TO GET CALORIES  LIKE MILK , ICE CREAM , YOGURT, BOOST ENSURE, CARNATION INSTANT BREAKFAST

## 2016-03-24 NOTE — Assessment & Plan Note (Signed)
Stable. No active anginal symptoms. On beta blocker, statin. Not on aspirin or Plavix because of ELIQUIS.

## 2016-03-24 NOTE — Assessment & Plan Note (Signed)
No longer really an issue on current medications.

## 2016-03-24 NOTE — Assessment & Plan Note (Signed)
Still has tachycardia spells, but now is on a standing dose of 75 3 times a day metoprolol tartrate and diltiazem along with digoxin. Anticoagulated with ELIQUIS.  Plan: Continue to monitor. If rate continues to be difficult, will potentially need to consider AV nodal ablation.

## 2016-03-24 NOTE — Assessment & Plan Note (Signed)
Still at goal on low-dose statin.

## 2016-03-30 ENCOUNTER — Encounter (HOSPITAL_COMMUNITY): Payer: Medicare HMO

## 2016-03-30 ENCOUNTER — Encounter (HOSPITAL_COMMUNITY)
Admission: RE | Admit: 2016-03-30 | Discharge: 2016-03-30 | Disposition: A | Discharge status: Home / Self Care | Payer: Medicare HMO | Source: Ambulatory Visit | Attending: Orthopedic Surgery | Admitting: Orthopedic Surgery

## 2016-03-30 DIAGNOSIS — S22000A Wedge compression fracture of unspecified thoracic vertebra, initial encounter for closed fracture: Secondary | ICD-10-CM

## 2016-03-30 DIAGNOSIS — M546 Pain in thoracic spine: Secondary | ICD-10-CM | POA: Insufficient documentation

## 2016-03-30 MED ORDER — TECHNETIUM TC 99M MEDRONATE IV KIT
25.0000 | PACK | Freq: Once | INTRAVENOUS | Status: AC | PRN
  Administered 2016-03-30: 25 via INTRAVENOUS

## 2016-04-01 ENCOUNTER — Other Ambulatory Visit: Payer: Self-pay | Admitting: Cardiology

## 2016-04-24 NOTE — Addendum Note (Signed)
Addended by: Alyson InglesBROOME, Makaveli Hoard L on: 04/24/2016 08:42 AM   Modules accepted: Orders

## 2016-04-29 ENCOUNTER — Inpatient Hospital Stay (HOSPITAL_COMMUNITY)
Admission: EM | Admit: 2016-04-29 | Discharge: 2016-05-01 | DRG: 871 | Disposition: A | Discharge status: Home / Self Care | Payer: Medicare HMO | Attending: Internal Medicine | Admitting: Internal Medicine

## 2016-04-29 ENCOUNTER — Emergency Department (HOSPITAL_COMMUNITY): Payer: Medicare HMO

## 2016-04-29 ENCOUNTER — Encounter (HOSPITAL_COMMUNITY): Payer: Self-pay

## 2016-04-29 DIAGNOSIS — A419 Sepsis, unspecified organism: Secondary | ICD-10-CM | POA: Diagnosis not present

## 2016-04-29 DIAGNOSIS — I5032 Chronic diastolic (congestive) heart failure: Secondary | ICD-10-CM | POA: Diagnosis present

## 2016-04-29 DIAGNOSIS — Z681 Body mass index (BMI) 19 or less, adult: Secondary | ICD-10-CM | POA: Diagnosis not present

## 2016-04-29 DIAGNOSIS — I482 Chronic atrial fibrillation, unspecified: Secondary | ICD-10-CM | POA: Diagnosis present

## 2016-04-29 DIAGNOSIS — I481 Persistent atrial fibrillation: Secondary | ICD-10-CM | POA: Diagnosis present

## 2016-04-29 DIAGNOSIS — J9621 Acute and chronic respiratory failure with hypoxia: Secondary | ICD-10-CM | POA: Diagnosis present

## 2016-04-29 DIAGNOSIS — Z7901 Long term (current) use of anticoagulants: Secondary | ICD-10-CM

## 2016-04-29 DIAGNOSIS — Z981 Arthrodesis status: Secondary | ICD-10-CM | POA: Diagnosis not present

## 2016-04-29 DIAGNOSIS — I739 Peripheral vascular disease, unspecified: Secondary | ICD-10-CM | POA: Diagnosis present

## 2016-04-29 DIAGNOSIS — R4182 Altered mental status, unspecified: Secondary | ICD-10-CM

## 2016-04-29 DIAGNOSIS — Z66 Do not resuscitate: Secondary | ICD-10-CM | POA: Diagnosis present

## 2016-04-29 DIAGNOSIS — N183 Chronic kidney disease, stage 3 unspecified: Secondary | ICD-10-CM | POA: Diagnosis present

## 2016-04-29 DIAGNOSIS — I255 Ischemic cardiomyopathy: Secondary | ICD-10-CM | POA: Diagnosis present

## 2016-04-29 DIAGNOSIS — I130 Hypertensive heart and chronic kidney disease with heart failure and stage 1 through stage 4 chronic kidney disease, or unspecified chronic kidney disease: Secondary | ICD-10-CM | POA: Diagnosis present

## 2016-04-29 DIAGNOSIS — J90 Pleural effusion, not elsewhere classified: Secondary | ICD-10-CM | POA: Diagnosis present

## 2016-04-29 DIAGNOSIS — Z8673 Personal history of transient ischemic attack (TIA), and cerebral infarction without residual deficits: Secondary | ICD-10-CM | POA: Diagnosis not present

## 2016-04-29 DIAGNOSIS — J9811 Atelectasis: Secondary | ICD-10-CM | POA: Diagnosis present

## 2016-04-29 DIAGNOSIS — R945 Abnormal results of liver function studies: Secondary | ICD-10-CM

## 2016-04-29 DIAGNOSIS — R7989 Other specified abnormal findings of blood chemistry: Secondary | ICD-10-CM | POA: Diagnosis not present

## 2016-04-29 DIAGNOSIS — I5042 Chronic combined systolic (congestive) and diastolic (congestive) heart failure: Secondary | ICD-10-CM | POA: Diagnosis present

## 2016-04-29 DIAGNOSIS — I2510 Atherosclerotic heart disease of native coronary artery without angina pectoris: Secondary | ICD-10-CM | POA: Diagnosis present

## 2016-04-29 DIAGNOSIS — Z951 Presence of aortocoronary bypass graft: Secondary | ICD-10-CM | POA: Diagnosis not present

## 2016-04-29 DIAGNOSIS — Z90710 Acquired absence of both cervix and uterus: Secondary | ICD-10-CM

## 2016-04-29 DIAGNOSIS — Z823 Family history of stroke: Secondary | ICD-10-CM

## 2016-04-29 DIAGNOSIS — I10 Essential (primary) hypertension: Secondary | ICD-10-CM | POA: Diagnosis present

## 2016-04-29 DIAGNOSIS — E43 Unspecified severe protein-calorie malnutrition: Secondary | ICD-10-CM | POA: Diagnosis present

## 2016-04-29 DIAGNOSIS — J9601 Acute respiratory failure with hypoxia: Secondary | ICD-10-CM | POA: Diagnosis not present

## 2016-04-29 DIAGNOSIS — G9340 Encephalopathy, unspecified: Secondary | ICD-10-CM | POA: Diagnosis present

## 2016-04-29 DIAGNOSIS — Z8249 Family history of ischemic heart disease and other diseases of the circulatory system: Secondary | ICD-10-CM | POA: Diagnosis not present

## 2016-04-29 DIAGNOSIS — G8929 Other chronic pain: Secondary | ICD-10-CM | POA: Diagnosis present

## 2016-04-29 DIAGNOSIS — R000 Tachycardia, unspecified: Secondary | ICD-10-CM | POA: Diagnosis present

## 2016-04-29 DIAGNOSIS — R680 Hypothermia, not associated with low environmental temperature: Secondary | ICD-10-CM | POA: Diagnosis present

## 2016-04-29 DIAGNOSIS — J189 Pneumonia, unspecified organism: Secondary | ICD-10-CM | POA: Diagnosis present

## 2016-04-29 DIAGNOSIS — E039 Hypothyroidism, unspecified: Secondary | ICD-10-CM | POA: Diagnosis present

## 2016-04-29 DIAGNOSIS — Z79899 Other long term (current) drug therapy: Secondary | ICD-10-CM

## 2016-04-29 DIAGNOSIS — R17 Unspecified jaundice: Secondary | ICD-10-CM | POA: Diagnosis present

## 2016-04-29 DIAGNOSIS — Z9889 Other specified postprocedural states: Secondary | ICD-10-CM | POA: Diagnosis not present

## 2016-04-29 DIAGNOSIS — M545 Low back pain: Secondary | ICD-10-CM | POA: Diagnosis present

## 2016-04-29 DIAGNOSIS — D509 Iron deficiency anemia, unspecified: Secondary | ICD-10-CM | POA: Diagnosis present

## 2016-04-29 DIAGNOSIS — Z8701 Personal history of pneumonia (recurrent): Secondary | ICD-10-CM

## 2016-04-29 LAB — URINALYSIS, ROUTINE W REFLEX MICROSCOPIC
Bilirubin Urine: NEGATIVE
Glucose, UA: NEGATIVE mg/dL
HGB URINE DIPSTICK: NEGATIVE
Ketones, ur: NEGATIVE mg/dL
Leukocytes, UA: NEGATIVE
Nitrite: NEGATIVE
PROTEIN: 100 mg/dL — AB
Specific Gravity, Urine: 1.02 (ref 1.005–1.030)
pH: 5 (ref 5.0–8.0)

## 2016-04-29 LAB — CBC WITH DIFFERENTIAL/PLATELET
BASOS ABS: 0 10*3/uL (ref 0.0–0.1)
Basophils Relative: 0 %
EOS ABS: 0 10*3/uL (ref 0.0–0.7)
EOS PCT: 0 %
HCT: 30.6 % — ABNORMAL LOW (ref 36.0–46.0)
HEMOGLOBIN: 8.8 g/dL — AB (ref 12.0–15.0)
LYMPHS PCT: 4 %
Lymphs Abs: 0.2 10*3/uL — ABNORMAL LOW (ref 0.7–4.0)
MCH: 23.2 pg — ABNORMAL LOW (ref 26.0–34.0)
MCHC: 28.8 g/dL — ABNORMAL LOW (ref 30.0–36.0)
MCV: 80.7 fL (ref 78.0–100.0)
Monocytes Absolute: 0.8 10*3/uL (ref 0.1–1.0)
Monocytes Relative: 12 %
NEUTROS PCT: 84 %
Neutro Abs: 5.4 10*3/uL (ref 1.7–7.7)
PLATELETS: 257 10*3/uL (ref 150–400)
RBC: 3.79 MIL/uL — AB (ref 3.87–5.11)
RDW: 19.4 % — ABNORMAL HIGH (ref 11.5–15.5)
WBC: 6.5 10*3/uL (ref 4.0–10.5)

## 2016-04-29 LAB — COMPREHENSIVE METABOLIC PANEL
ALK PHOS: 140 U/L — AB (ref 38–126)
ALT: 602 U/L — ABNORMAL HIGH (ref 14–54)
AST: 557 U/L — AB (ref 15–41)
Albumin: 3.8 g/dL (ref 3.5–5.0)
Anion gap: 6 (ref 5–15)
BUN: 39 mg/dL — AB (ref 6–20)
CO2: 30 mmol/L (ref 22–32)
CREATININE: 1.23 mg/dL — AB (ref 0.44–1.00)
Calcium: 7.9 mg/dL — ABNORMAL LOW (ref 8.9–10.3)
Chloride: 102 mmol/L (ref 101–111)
GFR calc Af Amer: 48 mL/min — ABNORMAL LOW (ref >60)
GFR calc non Af Amer: 42 mL/min — ABNORMAL LOW (ref >60)
GLUCOSE: 159 mg/dL — AB (ref 65–99)
Potassium: 4.1 mmol/L (ref 3.5–5.1)
SODIUM: 138 mmol/L (ref 135–145)
Total Bilirubin: 2.5 mg/dL — ABNORMAL HIGH (ref 0.3–1.2)
Total Protein: 5.7 g/dL — ABNORMAL LOW (ref 6.5–8.1)

## 2016-04-29 LAB — TROPONIN I

## 2016-04-29 LAB — DIGOXIN LEVEL: DIGOXIN LVL: 0.8 ng/mL (ref 0.8–2.0)

## 2016-04-29 LAB — BRAIN NATRIURETIC PEPTIDE: B NATRIURETIC PEPTIDE 5: 1004 pg/mL — AB (ref 0.0–100.0)

## 2016-04-29 LAB — AMMONIA: AMMONIA: 38 umol/L — AB (ref 9–35)

## 2016-04-29 LAB — ACETAMINOPHEN LEVEL

## 2016-04-29 LAB — CBG MONITORING, ED: GLUCOSE-CAPILLARY: 169 mg/dL — AB (ref 65–99)

## 2016-04-29 MED ORDER — MIRTAZAPINE 15 MG PO TABS: 15.0000 mg | ORAL_TABLET | Freq: Every evening | ORAL | Status: DC | PRN

## 2016-04-29 MED ORDER — LEVOTHYROXINE SODIUM 75 MCG PO TABS
37.5000 ug | ORAL_TABLET | Freq: Every day | ORAL | Status: DC
  Administered 2016-05-01: 37.5 ug via ORAL
  Filled 2016-04-29: qty 0.5

## 2016-04-29 MED ORDER — IPRATROPIUM-ALBUTEROL 0.5-2.5 (3) MG/3ML IN SOLN: 3.0000 mL | RESPIRATORY_TRACT | Status: DC | PRN

## 2016-04-29 MED ORDER — DIGOXIN 125 MCG PO TABS
62.5000 ug | ORAL_TABLET | Freq: Every day | ORAL | Status: DC
  Administered 2016-04-29 – 2016-05-01 (×3): 62.5 ug via ORAL
  Filled 2016-04-29 (×3): qty 0.5

## 2016-04-29 MED ORDER — SODIUM CHLORIDE 0.9% FLUSH: 3.0000 mL | INTRAVENOUS | Status: DC | PRN

## 2016-04-29 MED ORDER — AZITHROMYCIN 250 MG PO TABS
500.0000 mg | ORAL_TABLET | ORAL | Status: DC
  Administered 2016-04-30 – 2016-05-01 (×2): 500 mg via ORAL
  Filled 2016-04-29 (×2): qty 2

## 2016-04-29 MED ORDER — DEXTROSE 5 % IV SOLN
1.0000 g | INTRAVENOUS | Status: DC
  Administered 2016-04-30: 1 g via INTRAVENOUS
  Filled 2016-04-29 (×2): qty 10

## 2016-04-29 MED ORDER — BOOST / RESOURCE BREEZE PO LIQD
1.0000 | Freq: Three times a day (TID) | ORAL | Status: DC
  Administered 2016-04-29 – 2016-05-01 (×5): 1 via ORAL

## 2016-04-29 MED ORDER — FERROUS SULFATE 325 (65 FE) MG PO TABS
325.0000 mg | ORAL_TABLET | Freq: Every day | ORAL | Status: DC
  Administered 2016-04-30 – 2016-05-01 (×2): 325 mg via ORAL
  Filled 2016-04-29 (×2): qty 1

## 2016-04-29 MED ORDER — METHOCARBAMOL 500 MG PO TABS: 500.0000 mg | ORAL_TABLET | Freq: Two times a day (BID) | ORAL | Status: DC | PRN

## 2016-04-29 MED ORDER — DEXTROSE 5 % IV SOLN
500.0000 mg | Freq: Once | INTRAVENOUS | Status: AC
  Administered 2016-04-29: 500 mg via INTRAVENOUS
  Filled 2016-04-29: qty 500

## 2016-04-29 MED ORDER — IOPAMIDOL (ISOVUE-300) INJECTION 61%
INTRAVENOUS | Status: AC
  Filled 2016-04-29: qty 30

## 2016-04-29 MED ORDER — DEXTROSE 5 % IV SOLN
1.0000 g | Freq: Once | INTRAVENOUS | Status: AC
  Administered 2016-04-29: 1 g via INTRAVENOUS
  Filled 2016-04-29: qty 10

## 2016-04-29 MED ORDER — OXYCODONE-ACETAMINOPHEN 7.5-325 MG PO TABS
1.0000 | ORAL_TABLET | ORAL | Status: DC | PRN
  Administered 2016-04-30 – 2016-05-01 (×4): 1 via ORAL
  Filled 2016-04-29 (×4): qty 1

## 2016-04-29 MED ORDER — METOPROLOL TARTRATE 25 MG PO TABS
75.0000 mg | ORAL_TABLET | Freq: Three times a day (TID) | ORAL | Status: DC
  Administered 2016-04-29 – 2016-05-01 (×5): 75 mg via ORAL
  Filled 2016-04-29 (×6): qty 3

## 2016-04-29 MED ORDER — SODIUM CHLORIDE 0.9 % IV SOLN: 250.0000 mL | INTRAVENOUS | Status: DC | PRN

## 2016-04-29 MED ORDER — SENNOSIDES-DOCUSATE SODIUM 8.6-50 MG PO TABS: 1.0000 | ORAL_TABLET | Freq: Every evening | ORAL | Status: DC | PRN

## 2016-04-29 MED ORDER — APIXABAN 5 MG PO TABS
5.0000 mg | ORAL_TABLET | Freq: Two times a day (BID) | ORAL | Status: DC
  Administered 2016-04-29 – 2016-05-01 (×4): 5 mg via ORAL
  Filled 2016-04-29 (×4): qty 1

## 2016-04-29 MED ORDER — DILTIAZEM HCL ER COATED BEADS 180 MG PO CP24
180.0000 mg | ORAL_CAPSULE | Freq: Every day | ORAL | Status: DC
  Administered 2016-04-29 – 2016-05-01 (×3): 180 mg via ORAL
  Filled 2016-04-29 (×3): qty 1

## 2016-04-29 MED ORDER — SODIUM CHLORIDE 0.9% FLUSH
3.0000 mL | Freq: Two times a day (BID) | INTRAVENOUS | Status: DC
  Administered 2016-04-30 – 2016-05-01 (×2): 3 mL via INTRAVENOUS

## 2016-04-29 NOTE — ED Provider Notes (Signed)
[facility]-EMERGENCY DEPT Provider Note   CSN: [id number] Arrival date & time: 04/29/16  1004     History   Chief Complaint Chief Complaint  Patient presents with  . Weakness    HPI Brenda Laskowski Elks is a 76 y.o. female.  HPI Pt presents with her family member after being found to have increasing confusion and generalized weakness over the past several days. No documented fever.  Questionable jaundice noticed. Denies focal weakness. No HAs or recent head injury. Denies SOB but reports cough. No fever. No recent medication change. No abdominal pain. No recent n/v/d. Symptoms are moderate in severity   Past Medical History:  Diagnosis Date  . Anxiety   . CAD (coronary artery disease), native coronary artery 2009   75% LAD, diffuse 75-90% RCA --> Referred for CABG + MAZE;  Cardiolite 12/2013: No ischemia or Infarction  . Cervical neck pain with evidence of disc disease    with need for surgery -- November 2015  . Chronic low back pain    scoliosis & lordosis  . CKD (chronic kidney disease)   . COPD (chronic obstructive pulmonary disease) (HCC)   . Dyslipidemia, goal LDL below 70    On Crestor, followed by PCP  . Essential hypertension   . H/O ischemic left MCA stroke 11/18/2014   MRI/MRA of head: Multifocal left MCA infarct.. Also possible left ACA territory infarct -- complete occlusion of left MCA distally at M2 level. Marked left ACA attenuation.  . H/O: pneumonia   . Hypothyroidism   . Ischemic cardiomyopathy 2012   EF ~40-45% by Echo  . Osteoarthritis of back    And neck, hands,spine  . PAD (peripheral artery disease) (HCC) 2007   s/p L Ileac A stent ; most recent Dopplers July 2012: Less than 50% reduction bilaterally. ABI 0.96 on the right 0.88 on left;;;12/27/2011   -ABI right .87 and left ABI .78  ,LEFT CIA and EIA stent normall patency, left CFA,SFA,and popliteal 0-49%; rgt proximal SFA 50-69%,rgt CIA,EIA, and CFA 0-49%  . Persistent atrial fibrillation (HCC) 04/05/2012   Now  permanent A. fib s/p MAZE -- recurrence, cardioversion-converted to sinus bradycardia --> Now persistent  . Pulmonary hypertension    Estimated PA pressure on Echo January 2017 = 59 mmHg with dilated IVC, severely dilated RA and moderate TR.  . S/P CABG x 2 2009   LIMA-LAD, SVG-RCA, with AV fistula ligation and Maze procedure  . Stroke (HCC)   . TIA (transient ischemic attack) 04/05/2015    Patient Active Problem List   Diagnosis Date Noted  . Pacemaker   . Tachy-brady syndrome (HCC)   . Severe protein-calorie malnutrition (HCC) 11/18/2015  . Hypokalemia 11/17/2015  . Microcytic anemia 11/17/2015  . Chronic diastolic CHF (congestive heart failure) (HCC) 11/17/2015  . Chronic atrial fibrillation with RVR (HCC) 11/17/2015  . Pleural effusion 09/22/2015  . CAP (community acquired pneumonia) 09/18/2015  . Atrial fibrillation with RVR (HCC) 09/18/2015  . Chronic atrial fibrillation (HCC) 09/13/2015  . Chronic anticoagulation 09/13/2015  . Pulmonary hypertension   . Cough   . History of stroke   . Acute encephalopathy   . Acute cystitis without hematuria   . Aspiration pneumonia (HCC)   . TIA (transient ischemic attack) 04/05/2015  . CKD (chronic kidney disease) stage 3, GFR 30-59 ml/min 11/18/2014  . H/O ischemic left MCA stroke 11/18/2014  . Stroke with cerebral ischemia (HCC)   . Weight loss   . Spinal stenosis in cervical region 01/23/2014  .  Preoperative cardiovascular examination 01/13/2014  . Ischemic cardiomyopathy   . PAD (peripheral artery disease) (HCC)   . S/P CABG x 2   . Nausea and vomiting 11/29/2011  . 2 Vessel CAD - s/p CABG; LIMA-LAD, SVG-RCA 11/29/2011  . Permanent atrial fibrillation (HCC);  CHA2DS2Vasc is at least 7 on Eliquis.   11/29/2011  . Hyperlipidemia with target LDL less than 70 11/29/2011  . Essential hypertension 11/29/2011  . Hypothyroidism 11/29/2011    Past Surgical History:  Procedure Laterality Date  . ABDOMINAL HYSTERECTOMY    .  ANTERIOR CERVICAL DECOMP/DISCECTOMY FUSION N/A 01/23/2014   Procedure: ANTERIOR CERVICAL DECOMPRESSION/DISCECTOMY FUSION CERVICAL FOUR-FIVE,CERVICAL FIVE-SIX,CERVICAL SIX-SEVEN;  Surgeon: Mariam Dollar, MD;  Location: [facility];  Service: Neurosurgery;  Laterality: N/A;  . CARDIAC CATHETERIZATION  07/30/2007   75% LAD ,3 stenoses of 75-90% in  RCA;circumflex from proximal RCA with no lesion seen,normal ramus intermediate branh; normal LV systoilc function  . CARDIOVERSION  04/05/2012   Procedure: CARDIOVERSION;  Surgeon: Thurmon Fair, MD;  Location: [facility];  Service: Cardiovascular;  Laterality: N/A;  . COLONOSCOPY WITH PROPOFOL N/A 11/12/2014   Procedure: COLONOSCOPY WITH PROPOFOL;  Surgeon: Charna Elizabeth, MD;  Location: [facility];  Service: Endoscopy;  Laterality: N/A;  . CORONARY ARTERY BYPASS GRAFT  2009   LIMA-LAD, SVG-RCA (also MAZE & AV fistula ligation)  . EP IMPLANTABLE DEVICE N/A 11/23/2015   Procedure: Pacemaker Implant;  Surgeon: Marinus Maw, MD;  Location: [facility] INVASIVE CV LAB;  Service: Cardiovascular;  Laterality: N/A;  . ILIAC ARTERY STENT Left 04/06/2005   Ex Iliac - CFA (Smart STENTS -- 7x4 in EIA, 6 x 3 CFA)   . MAZE  2009   along with CABG  . NM CARDIOLITE LTD  12/2013   Non-gated for Afib; No Ischemia or Infarction.  Marland Kitchen NM MYOVIEW LTD  May 2013   No ischemia or Infarct  . TRANSTHORACIC ECHOCARDIOGRAM  Jan 2012   EF ~40-45%, global HK; PAP ~45-50 mmHg  . TRANSTHORACIC ECHOCARDIOGRAM  Jan 2017   EF ~50%. No RWMA, Afib. Paradoxical Septal motion. no DD assessment. Mod LA dilation.  PAP ~59 mmHg & dilated IVC. Mod TR. Severe RA dilation.    OB History    No data available       Home Medications    Prior to Admission medications   Medication Sig Start Date End Date Taking? Authorizing Provider  AMITIZA 24 MCG capsule Take 24 mcg by mouth every evening.  03/17/15   Historical Provider, MD  apixaban (ELIQUIS) 5 MG TABS tablet Take 1 tablet (5 mg total) by mouth 2  (two) times daily. 11/19/14   Joseph Art, DO  Cholecalciferol (VITAMIN D3) 2000 units TABS Take 1 tablet by mouth daily.    Historical Provider, MD  diltiazem (CARDIZEM CD) 180 MG 24 hr capsule Take 1 capsule (180 mg total) by mouth daily. 12/17/15 03/16/16  Marykay Lex, MD  feeding supplement (BOOST / RESOURCE BREEZE) LIQD Take 1 Container by mouth 3 (three) times daily between meals. 09/23/15   Darreld Mclean, MD  feeding supplement (ENSURE CLINICAL STRENGTH) LIQD Take 237 mLs by mouth 3 (three) times daily with meals. 11/24/15   Starleen Arms, MD  ferrous sulfate 325 (65 FE) MG tablet Take 1 tablet (325 mg total) by mouth daily with breakfast. 11/24/15   Starleen Arms, MD  furosemide (LASIX) 20 MG tablet Take 1 tablet (20 mg total) by mouth every other day. Take extra dose if  having fluid retention, or  leg edema 11/24/15   Starleen Armsawood S Elgergawy, MD  LANOXIN 62.5 MCG TABS TAKE ONE TABLET BY MOUTH DAILY 04/04/16   Marykay Lexavid W Harding, MD  levothyroxine (SYNTHROID, LEVOTHROID) 75 MCG tablet Take 0.5 tablets (37.5 mcg total) by mouth daily before breakfast. 01/31/16   Marykay Lexavid W Harding, MD  Metoprolol Tartrate 75 MG TABS Take 75 mg by mouth 3 (three) times daily. 01/12/16   Marykay Lexavid W Harding, MD  Multiple Vitamins-Minerals (PRESERVISION AREDS 2) CAPS Take 1 capsule by mouth 2 (two) times daily.    Historical Provider, MD  oxyCODONE-acetaminophen (PERCOCET) 7.5-325 MG tablet Take 1 tablet by mouth every 4 (four) hours as needed for severe pain.    Historical Provider, MD  potassium chloride (K-DUR) 10 MEQ tablet Take 1 tablet (10 mEq total) by mouth daily. 01/31/16   Marykay Lexavid W Harding, MD  rosuvastatin (CRESTOR) 5 MG tablet TAKE 1 TABLET BY MOUTH IN THE EVENING 12/22/15   Marykay Lexavid W Harding, MD  senna-docusate (SENOKOT-S) 8.6-50 MG tablet Take 1 tablet by mouth at bedtime as needed for mild constipation. 04/09/15   Richarda OverlieNayana Abrol, MD    Family History Family History  Problem Relation Age of Onset  . Heart  attack Mother     Late 6070s  . Stroke Mother   . Stroke Father   . Heart attack Father     6680s  . Heart disease Brother 60    Cardiomegaly  . Stroke Sister   . Heart attack Son [id number]    Social History Social History  Substance Use Topics  . Smoking status: Former Smoker    Packs/day: 0.50    Types: Cigarettes    Quit date: 01/07/1998  . Smokeless tobacco: Never Used  . Alcohol use No     Allergies   Patient has no known allergies.   Review of Systems Review of Systems  All other systems reviewed and are negative.    Physical Exam Updated Vital Signs BP 125/81 (BP Location: Right Arm)   Pulse 111   Temp (!) 96.7 F (35.9 C) (Rectal)   Resp 18   Ht 4\' 11"  (1.499 m)   Wt 74 lb (33.6 kg)   SpO2 (!) 87%   BMI 14.95 kg/m   Physical Exam  Constitutional: She is oriented to person, place, and time. She appears well-developed and well-nourished. No distress.  HENT:  Head: Normocephalic and atraumatic.  Eyes: EOM are normal. Pupils are equal, round, and reactive to light.  Neck: Normal range of motion.  Cardiovascular: Normal rate, regular rhythm and normal heart sounds.   Pulmonary/Chest: Effort normal and breath sounds normal.  Abdominal: Soft. She exhibits no distension. There is no tenderness.  Musculoskeletal: Normal range of motion.  Neurological: She is alert and oriented to person, place, and time.  5/5 strength in major muscle groups of  bilateral upper and lower extremities. Speech normal. No facial asymetry.   Skin: Skin is warm and dry.  Psychiatric: She has a normal mood and affect. Judgment normal.  Nursing note and vitals reviewed.    ED Treatments / Results  Labs (all labs ordered are listed, but only abnormal results are displayed) Labs Reviewed  CBG MONITORING, ED - Abnormal; Notable for the following:       Result Value   Glucose-Capillary 169 (*)    All other components within normal limits  CBG MONITORING, ED    EKG  EKG  Interpretation  Date/Time:  Saturday April 29 2016  10:37:19 EST Ventricular Rate:  91 PR Interval:    QRS Duration: 96 QT Interval:  335 QTC Calculation: 389 R Axis:   61 Text Interpretation:  Atrial fibrillation Paired ventricular premature complexes Borderline low voltage, extremity leads RSR' in V1 or V2, right VCD or RVH Nonspecific T abnormalities, lateral leads No significant change was found Confirmed by Daleigh Pollinger  MD, Caryn Bee ([id number]) on 04/29/2016 11:12:24 AM       Radiology Ct Abdomen Pelvis Wo Contrast  Result Date: 04/29/2016 CLINICAL DATA:  Altered mental status, weakness, bilateral pleural effusions, elevated liver function tests. EXAM: CT CHEST, ABDOMEN AND PELVIS WITHOUT CONTRAST TECHNIQUE: Multidetector CT imaging of the chest, abdomen and pelvis was performed following the standard protocol without IV contrast. COMPARISON:  Chest radiograph 04/29/2016 FINDINGS: CT CHEST FINDINGS Cardiovascular: Enlarged heart. No evidence of pericardial effusion. Biventricular pacemaker in place. Heavy calcific atherosclerotic disease of the coronary arteries and thoracic aorta. Post CABG. Mediastinum/Nodes: No enlarged mediastinal, hilar, or axillary lymph nodes. Thyroid gland, trachea, and esophagus demonstrate no significant findings. Lungs/Pleura: Advanced centrilobular emphysema. Mild hyperinflation. Atelectatic changes in the dependent portions of all lobes of the lungs. More confluent airspace opacity in the medial portion of the right middle lobe. Mild interstitial pulmonary edema. Small to moderate in size bilateral pleural effusions. Musculoskeletal: No chest wall mass or suspicious bone lesions identified. CT ABDOMEN PELVIS FINDINGS Hepatobiliary: No focal liver abnormality is seen. No gallstones, gallbladder wall thickening, or biliary dilatation. Pancreas: Unremarkable. No pancreatic ductal dilatation or surrounding inflammatory changes. Spleen: Normal in size without focal abnormality.  Adrenals/Urinary Tract: Adrenal glands are unremarkable. Kidneys are without renal calculi, or hydronephrosis. Hypoattenuated 1.6 cm lesion within the superior pole of the left kidney measures water density and therefore likely represents a cyst. Bladder is unremarkable. Stomach/Bowel: Stomach is within normal limits. No evidence of bowel wall thickening, distention, or inflammatory changes. Vascular/Lymphatic: Aortic atherosclerosis. No enlarged abdominal or pelvic lymph nodes. Reproductive: Status post hysterectomy. No adnexal masses. Other: Small amount of free fluid in the pelvis. Generalized mesenteric stranding in the abdomen and pelvis. Musculoskeletal: No suspicious osseous lesions. S shaped spinal scoliosis with associated moderate in severity osteoarthritic changes. IMPRESSION: Enlarged heart. Advanced calcific atherosclerotic disease of the coronary arteries and aorta. Advanced centrilobular emphysema and hyperinflation, consistent with COPD. Superimposed mild interstitial pulmonary edema. Moderate in size bilateral simple appearing pleural effusions. Widespread atelectatic changes in the dependent portions of the lungs. More confluent airspace opacity in the right middle lobe may represent round atelectasis/airspace consolidation or less likely pulmonary mass. Attention on follow-up is recommended. Small amount of free pelvic fluid and generalized mesenteric stranding of the abdomen. No significant abnormalities within the solid abdominal organs noted on this noncontrasted CT. Electronically Signed   By: Ted Mcalpine M.D.   On: 04/29/2016 15:43   Dg Chest 2 View  Result Date: 04/29/2016 CLINICAL DATA:  Weakness and confusion for 3 days. Cough and chest congestion. COPD. EXAM: CHEST  2 VIEW COMPARISON:  11/24/2015 FINDINGS: Moderate enlargement of the cardiopericardial silhouette with bilateral pleural effusions and passive atelectasis. The pleural effusions are mildly increased in size from  11/24/2015. Atherosclerotic calcification of the aortic arch. Prior CABG. Single lead pacer noted. Cervical plate and screw fixator. Suspected underlying emphysema.  No overt edema currently. Mild chronic mid thoracic wedging. Sternal manubrial deformity, chronic. IMPRESSION: 1. Small to moderate-sized bilateral pleural effusions, increased from prior, with associated passive atelectasis. 2. Moderate enlargement of the cardiopericardial silhouette, without overt edema. 3. Prior CABG. 4.  Atherosclerotic calcification of the aortic arch. 5. Emphysema. Electronically Signed   By: Gaylyn Rong M.D.   On: 04/29/2016 11:45   Ct Head Wo Contrast  Result Date: 04/29/2016 CLINICAL DATA:  Altered mental status EXAM: CT HEAD WITHOUT CONTRAST TECHNIQUE: Contiguous axial images were obtained from the base of the skull through the vertex without intravenous contrast. COMPARISON:  04/05/2015 FINDINGS: Brain: Old left frontal infarct. Mild chronic microvascular disease throughout the deep white matter. No acute intracranial abnormality. Specifically, no hemorrhage, hydrocephalus, mass lesion, acute infarction, or significant intracranial injury. Vascular: No hyperdense vessel or unexpected calcification. Skull: No acute calvarial abnormality. Sinuses/Orbits: Visualized paranasal sinuses and mastoids clear. Orbital soft tissues unremarkable. Other: None IMPRESSION: Old left frontal infarct. Mild chronic small vessel disease. No acute intracranial abnormality. Electronically Signed   By: Charlett Nose M.D.   On: 04/29/2016 12:01   Ct Chest Wo Contrast  Result Date: 04/29/2016 CLINICAL DATA:  Altered mental status, weakness, bilateral pleural effusions, elevated liver function tests. EXAM: CT CHEST, ABDOMEN AND PELVIS WITHOUT CONTRAST TECHNIQUE: Multidetector CT imaging of the chest, abdomen and pelvis was performed following the standard protocol without IV contrast. COMPARISON:  Chest radiograph 04/29/2016 FINDINGS: CT  CHEST FINDINGS Cardiovascular: Enlarged heart. No evidence of pericardial effusion. Biventricular pacemaker in place. Heavy calcific atherosclerotic disease of the coronary arteries and thoracic aorta. Post CABG. Mediastinum/Nodes: No enlarged mediastinal, hilar, or axillary lymph nodes. Thyroid gland, trachea, and esophagus demonstrate no significant findings. Lungs/Pleura: Advanced centrilobular emphysema. Mild hyperinflation. Atelectatic changes in the dependent portions of all lobes of the lungs. More confluent airspace opacity in the medial portion of the right middle lobe. Mild interstitial pulmonary edema. Small to moderate in size bilateral pleural effusions. Musculoskeletal: No chest wall mass or suspicious bone lesions identified. CT ABDOMEN PELVIS FINDINGS Hepatobiliary: No focal liver abnormality is seen. No gallstones, gallbladder wall thickening, or biliary dilatation. Pancreas: Unremarkable. No pancreatic ductal dilatation or surrounding inflammatory changes. Spleen: Normal in size without focal abnormality. Adrenals/Urinary Tract: Adrenal glands are unremarkable. Kidneys are without renal calculi, or hydronephrosis. Hypoattenuated 1.6 cm lesion within the superior pole of the left kidney measures water density and therefore likely represents a cyst. Bladder is unremarkable. Stomach/Bowel: Stomach is within normal limits. No evidence of bowel wall thickening, distention, or inflammatory changes. Vascular/Lymphatic: Aortic atherosclerosis. No enlarged abdominal or pelvic lymph nodes. Reproductive: Status post hysterectomy. No adnexal masses. Other: Small amount of free fluid in the pelvis. Generalized mesenteric stranding in the abdomen and pelvis. Musculoskeletal: No suspicious osseous lesions. S shaped spinal scoliosis with associated moderate in severity osteoarthritic changes. IMPRESSION: Enlarged heart. Advanced calcific atherosclerotic disease of the coronary arteries and aorta. Advanced  centrilobular emphysema and hyperinflation, consistent with COPD. Superimposed mild interstitial pulmonary edema. Moderate in size bilateral simple appearing pleural effusions. Widespread atelectatic changes in the dependent portions of the lungs. More confluent airspace opacity in the right middle lobe may represent round atelectasis/airspace consolidation or less likely pulmonary mass. Attention on follow-up is recommended. Small amount of free pelvic fluid and generalized mesenteric stranding of the abdomen. No significant abnormalities within the solid abdominal organs noted on this noncontrasted CT. Electronically Signed   By: Ted Mcalpine M.D.   On: 04/29/2016 15:43    Procedures Procedures (including critical care time)  Medications Ordered in ED Medications - No data to display   Initial Impression / Assessment and Plan / ED Course  I have reviewed the triage vital signs and the nursing notes.  Pertinent labs &  imaging results that were available during my care of the patient were reviewed by me and considered in my medical decision making (see chart for details).     ALT  Date Value Ref Range Status  04/30/2016 394 (H) 14 - 54 U/L Final  04/29/2016 602 (H) 14 - 54 U/L Final  04/09/2015 10 (L) 14 - 54 U/L Final  04/07/2015 11 (L) 14 - 54 U/L Final    AST  Date Value Ref Range Status  04/30/2016 305 (H) 15 - 41 U/L Final  04/29/2016 557 (H) 15 - 41 U/L Final  04/09/2015 19 15 - 41 U/L Final  04/07/2015 18 15 - 41 U/L Final   Questionable PNA. Will cover with abx and admit.  Unclear etiology of elevated LFts.    Final Clinical Impressions(s) / ED Diagnoses   Final diagnoses:  None    New Prescriptions New Prescriptions   No medications on file     Azalia Bilis, MD 04/30/16 2300

## 2016-04-29 NOTE — ED Notes (Signed)
I have just given report to Rosey Batheresa, RN on [address]; and [facility] will transport now with tele.

## 2016-04-29 NOTE — H&P (Addendum)
History and Physical   [name] CHE:[id number] DOB: [birth year] DOA: 04/29/2016  Referring MD/NP/PA: Dr. Venora Maples, [place] PCP: Sandi Mariscal, MD Outpatient Specialists: Dr Ellyn Hack, Cardiology Patient coming from: Home  Chief Complaint: Weakness, confusion  HPI: [name] is a 76 y.o. female with a history of chronic AFib on eliquis, CAD s/p CABG, chronic CHF, COPD, h/o CVA, CKD among others who is brought by her daughter for evaluation of several days of confusion and weakness.   Her daughter provides most of the history due to patient's confusion/disorientation. She reports gradual onset of fatigue and diffuse weakness 4 days ago after physical therapy. She's been in bed most of the time since, has poor appetite and mild nausea as well. 2 days ago she began having intermittent hallucinations/delusions and confusion (thinking her deceased husband is on a golf course, or that the oven isn't working when it is) and disorientation, not sure what year it is. She has continued to have a chronic nonproductive cough during this time but did not complain of any symptoms. She specifically denies fevers, chest pain, dyspnea, palpitations, leg swelling, orthopnea abdominal pain, vomiting and diarrhea. Of note, she denied nausea until her daughter reminded her that she felt like she needed a bin for emesis earlier today. She's been taking medications as directed and denies sick contacts.   ED Course: On arrival she appeared exhausted after walking about 25 feet. She was noted to be hypothermic to 96.92F rectally, and hypoxemic to 87% on room air. CXR showed bilateral pleural effusions, increased from prior, with associated passive atelectasis on background emphysema and stable cardiomegaly. UA did not appear infected. Labs were remarkable for WBC 6.5, Hgb 8.8, creatinine 1.23, BUN 39 and LFTs newly abnormal with AST/ALT 557/602, alk phos 140, and TBili 2.5. Ammonia was 38. CT C/A/P demonstrated moderate simple  bilateral pleural effusions with widespread atelectasis with confluent airspace opacity in RML favored to be consolidation over mass. There were no significant abdominal findings other than generalized mesenteric stranding and small pelvic free fluid. Antibiotics for CAP were given and TRH called to admit for acute respiratory failure due to pneumonia and elevated LFTs.  Review of Systems: Per HPI. All others reviewed and are negative.   Past Medical History:  Diagnosis Date  . Anxiety   . CAD (coronary artery disease), native coronary artery 2009   75% LAD, diffuse 75-90% RCA --> Referred for CABG + MAZE;  Cardiolite 12/2013: No ischemia or Infarction  . Cervical neck pain with evidence of disc disease    with need for surgery -- November 2015  . Chronic low back pain    scoliosis & lordosis  . CKD (chronic kidney disease)   . COPD (chronic obstructive pulmonary disease) ([place])   . Dyslipidemia, goal LDL below 70    On Crestor, followed by PCP  . Essential hypertension   . H/O ischemic left MCA stroke 11/18/2014   MRI/MRA of head: Multifocal left MCA infarct.. Also possible left ACA territory infarct -- complete occlusion of left MCA distally at M2 level. Marked left ACA attenuation.  . H/O: pneumonia   . Hypothyroidism   . Ischemic cardiomyopathy 2012   EF ~40-45% by Echo  . Osteoarthritis of back    And neck, hands,spine  . PAD (peripheral artery disease) ([place]) 2007   s/p L Ileac A stent ; most recent Dopplers July 2012: Less than 50% reduction bilaterally. ABI 0.96 on the right 0.88 on left;;;12/27/2011   -ABI right .87 and  left ABI .78  ,LEFT CIA and EIA stent normall patency, left CFA,SFA,and popliteal 0-49%; rgt proximal SFA 50-69%,rgt CIA,EIA, and CFA 0-49%  . Persistent atrial fibrillation ([place]) 04/05/2012   Now permanent A. fib s/p MAZE -- recurrence, cardioversion-converted to sinus bradycardia --> Now persistent  . Pulmonary hypertension    Estimated PA pressure on Echo January  2017 = 59 mmHg with dilated IVC, severely dilated RA and moderate TR.  . S/P CABG x 2 2009   LIMA-LAD, SVG-RCA, with AV fistula ligation and Maze procedure  . Stroke ([place])   . TIA (transient ischemic attack) 04/05/2015    Past Surgical History:  Procedure Laterality Date  . ABDOMINAL HYSTERECTOMY    . ANTERIOR CERVICAL DECOMP/DISCECTOMY FUSION N/A 01/23/2014   Procedure: ANTERIOR CERVICAL DECOMPRESSION/DISCECTOMY FUSION CERVICAL FOUR-FIVE,CERVICAL FIVE-SIX,CERVICAL SIX-SEVEN;  Surgeon: Elaina Hoops, MD;  Location: [facility];  Service: Neurosurgery;  Laterality: N/A;  . CARDIAC CATHETERIZATION  07/30/2007   75% LAD ,3 stenoses of 75-90% in  RCA;circumflex from proximal RCA with no lesion seen,normal ramus intermediate branh; normal LV systoilc function  . CARDIOVERSION  04/05/2012   Procedure: CARDIOVERSION;  Surgeon: Sanda Klein, MD;  Location: [facility];  Service: Cardiovascular;  Laterality: N/A;  . COLONOSCOPY WITH PROPOFOL N/A 11/12/2014   Procedure: COLONOSCOPY WITH PROPOFOL;  Surgeon: Juanita Craver, MD;  Location: [facility];  Service: Endoscopy;  Laterality: N/A;  . CORONARY ARTERY BYPASS GRAFT  2009   LIMA-LAD, SVG-RCA (also MAZE & AV fistula ligation)  . EP IMPLANTABLE DEVICE N/A 11/23/2015   Procedure: Pacemaker Implant;  Surgeon: Evans Lance, MD;  Location: [facility];  Service: Cardiovascular;  Laterality: N/A;  . ILIAC ARTERY STENT Left 04/06/2005   Ex Iliac - CFA (Smart STENTS -- 7x4 in EIA, 6 x 3 CFA)   . MAZE  2009   along with CABG  . NM CARDIOLITE LTD  12/2013   Non-gated for Afib; No Ischemia or Infarction.  Marland Kitchen NM MYOVIEW LTD  May 2013   No ischemia or Infarct  . TRANSTHORACIC ECHOCARDIOGRAM  Jan 2012   EF ~40-45%, global HK; PAP ~45-50 mmHg  . TRANSTHORACIC ECHOCARDIOGRAM  Jan 2017   EF ~50%. No RWMA, Afib. Paradoxical Septal motion. no DD assessment. Mod LA dilation.  PAP ~59 mmHg & dilated IVC. Mod TR. Severe RA dilation.    - H/o cigarette use,  quit decades ago. No EtOH or illicit substances. Lives with daughter.   No Known Allergies  Family History  Problem Relation Age of Onset  . Heart attack Mother     Late 64s  . Stroke Mother   . Stroke Father   . Heart attack Father     62s  . Heart disease Brother 60    Cardiomegaly  . Stroke Sister   . Heart attack Son 78   - Family history otherwise reviewed and not pertinent.  Prior to Admission medications   Medication Sig Start Date End Date Taking? Authorizing Provider  apixaban (ELIQUIS) 5 MG TABS tablet Take 1 tablet (5 mg total) by mouth 2 (two) times daily. 11/19/14  Yes Geradine Girt, DO  Cholecalciferol (VITAMIN D3) 2000 units TABS Take 1 tablet by mouth daily.   Yes Historical Provider, MD  diltiazem (CARDIZEM CD) 180 MG 24 hr capsule Take 1 capsule (180 mg total) by mouth daily. 12/17/15 04/29/16 Yes Leonie Man, MD  ferrous sulfate 325 (65 FE) MG tablet Take 1 tablet (325 mg total) by mouth daily  with breakfast. 11/24/15  Yes Albertine Patricia, MD  furosemide (LASIX) 20 MG tablet Take 1 tablet (20 mg total) by mouth every other day. Take extra dose if having fluid retention, or  leg edema 11/24/15  Yes Albertine Patricia, MD  LANOXIN 62.5 MCG TABS TAKE ONE TABLET BY MOUTH DAILY 04/04/16  Yes Leonie Man, MD  levothyroxine (SYNTHROID, LEVOTHROID) 75 MCG tablet Take 0.5 tablets (37.5 mcg total) by mouth daily before breakfast. 01/31/16  Yes Leonie Man, MD  methocarbamol (ROBAXIN) 500 MG tablet Take 500 mg by mouth 2 (two) times daily.  03/14/16  Yes Historical Provider, MD  Metoprolol Tartrate 75 MG TABS Take 75 mg by mouth 3 (three) times daily. 01/12/16  Yes Leonie Man, MD  mirtazapine (REMERON) 15 MG tablet  04/13/16  Yes Historical Provider, MD  oxyCODONE-acetaminophen (PERCOCET) 7.5-325 MG tablet Take 1 tablet by mouth every 4 (four) hours as needed for severe pain.   Yes Historical Provider, MD  potassium chloride (K-DUR) 10 MEQ tablet Take 1 tablet (10  mEq total) by mouth daily. Patient taking differently: Take 10 mEq by mouth every other day.  01/31/16  Yes Leonie Man, MD  psyllium (HYDROCIL/METAMUCIL) 95 % PACK Take 1 packet by mouth every other day.   Yes Historical Provider, MD  rosuvastatin (CRESTOR) 5 MG tablet TAKE 1 TABLET BY MOUTH IN THE EVENING 12/22/15  Yes Leonie Man, MD  feeding supplement (BOOST / RESOURCE BREEZE) LIQD Take 1 Container by mouth 3 (three) times daily between meals. Patient not taking: Reported on 04/29/2016 09/23/15   Zada Finders, MD  feeding supplement (ENSURE CLINICAL STRENGTH) LIQD Take 237 mLs by mouth 3 (three) times daily with meals. Patient not taking: Reported on 04/29/2016 11/24/15   Silver Huguenin Elgergawy, MD  senna-docusate (SENOKOT-S) 8.6-50 MG tablet Take 1 tablet by mouth at bedtime as needed for mild constipation. Patient not taking: Reported on 04/29/2016 04/09/15   Reyne Dumas, MD    Physical Exam: Vitals:   04/29/16 1124 04/29/16 1311 04/29/16 1432 04/29/16 1526  BP: 101/63 117/68 103/62 109/66  Pulse: 93 90 83 80  Resp: '18 18 15 16  '$ Temp:      TempSrc:      SpO2: 97% 98% 96% 96%  Weight:      Height:       Constitutional: 76 y.o. female in no distress, calm demeanor Eyes: Lids and conjunctivae normal, PERRL ENMT: Mucous membranes are moist. Posterior pharynx clear of any exudate or lesions. Fair dentition.  Neck: normal, supple, no masses, no thyromegaly Respiratory: Non-labored breathing 2L by  without accessory muscle use. Clear breath sounds diminished at bilateral bases without crackles or wheezes. Cardiovascular: Irreg with rate in 80's, no murmurs, rubs, or gallops. No carotid bruits. No JVD. No LE edema. 1+ pedal pulses. Midline sternotomy scar noted and Left chest PPM nontender. Abdomen: Normoactive bowel sounds. No tenderness, non-distended, and no masses palpated. No hepatosplenomegaly. GU: No indwelling catheter Musculoskeletal: No clubbing / cyanosis. No joint deformity  upper and lower extremities. Good ROM, no contractures. Normal muscle tone.  Skin: Warm, dry. Diffusely mildly jaundiced. Also with senile purpura, otherwise no significant lesions noted.  Neurologic: CN II-XII grossly intact. Gait not assessed. Speech normal. No focal deficits in motor strength or sensation in all extremities.  Psychiatric: Alert, oriented to [facility] though isn't sure why she's here. Thought it was December and refused to guess what year it is. Denies current hallucinations, does  not appear to be responding to internal stimuli.   Labs on Admission: I have personally reviewed following labs and imaging studies  CBC:  Recent Labs Lab 04/29/16 1120  WBC 6.5  NEUTROABS 5.4  HGB 8.8*  HCT 30.6*  MCV 80.7  PLT 257   Basic Metabolic Panel:  Recent Labs Lab 04/29/16 1120  NA 138  K 4.1  CL 102  CO2 30  GLUCOSE 159*  BUN 39*  CREATININE 1.23*  CALCIUM 7.9*   GFR: Estimated Creatinine Clearance: 20.6 mL/min (by C-G formula based on SCr of 1.23 mg/dL (H)). Liver Function Tests:  Recent Labs Lab 04/29/16 1120  AST 557*  ALT 602*  ALKPHOS 140*  BILITOT 2.5*  PROT 5.7*  ALBUMIN 3.8    Recent Labs Lab 04/29/16 1223  AMMONIA 38*   CBG:  Recent Labs Lab 04/29/16 1026  GLUCAP 169*   Urine analysis:    Component Value Date/Time   COLORURINE AMBER (A) 04/29/2016 1120   APPEARANCEUR HAZY (A) 04/29/2016 1120   LABSPEC 1.020 04/29/2016 1120   PHURINE 5.0 04/29/2016 1120   GLUCOSEU NEGATIVE 04/29/2016 1120   HGBUR NEGATIVE 04/29/2016 1120   BILIRUBINUR NEGATIVE 04/29/2016 1120   KETONESUR NEGATIVE 04/29/2016 1120   PROTEINUR 100 (A) 04/29/2016 1120   UROBILINOGEN 0.2 11/03/2014 2115   NITRITE NEGATIVE 04/29/2016 1120   LEUKOCYTESUR NEGATIVE 04/29/2016 1120   Radiological Exams on Admission: Ct Abdomen Pelvis Wo Contrast  Result Date: 04/29/2016 CLINICAL DATA:  Altered mental status, weakness, bilateral pleural effusions, elevated  liver function tests. EXAM: CT CHEST, ABDOMEN AND PELVIS WITHOUT CONTRAST TECHNIQUE: Multidetector CT imaging of the chest, abdomen and pelvis was performed following the standard protocol without IV contrast. COMPARISON:  Chest radiograph 04/29/2016 FINDINGS: CT CHEST FINDINGS Cardiovascular: Enlarged heart. No evidence of pericardial effusion. Biventricular pacemaker in place. Heavy calcific atherosclerotic disease of the coronary arteries and thoracic aorta. Post CABG. Mediastinum/Nodes: No enlarged mediastinal, hilar, or axillary lymph nodes. Thyroid gland, trachea, and esophagus demonstrate no significant findings. Lungs/Pleura: Advanced centrilobular emphysema. Mild hyperinflation. Atelectatic changes in the dependent portions of all lobes of the lungs. More confluent airspace opacity in the medial portion of the right middle lobe. Mild interstitial pulmonary edema. Small to moderate in size bilateral pleural effusions. Musculoskeletal: No chest wall mass or suspicious bone lesions identified. CT ABDOMEN PELVIS FINDINGS Hepatobiliary: No focal liver abnormality is seen. No gallstones, gallbladder wall thickening, or biliary dilatation. Pancreas: Unremarkable. No pancreatic ductal dilatation or surrounding inflammatory changes. Spleen: Normal in size without focal abnormality. Adrenals/Urinary Tract: Adrenal glands are unremarkable. Kidneys are without renal calculi, or hydronephrosis. Hypoattenuated 1.6 cm lesion within the superior pole of the left kidney measures water density and therefore likely represents a cyst. Bladder is unremarkable. Stomach/Bowel: Stomach is within normal limits. No evidence of bowel wall thickening, distention, or inflammatory changes. Vascular/Lymphatic: Aortic atherosclerosis. No enlarged abdominal or pelvic lymph nodes. Reproductive: Status post hysterectomy. No adnexal masses. Other: Small amount of free fluid in the pelvis. Generalized mesenteric stranding in the abdomen and  pelvis. Musculoskeletal: No suspicious osseous lesions. S shaped spinal scoliosis with associated moderate in severity osteoarthritic changes. IMPRESSION: Enlarged heart. Advanced calcific atherosclerotic disease of the coronary arteries and aorta. Advanced centrilobular emphysema and hyperinflation, consistent with COPD. Superimposed mild interstitial pulmonary edema. Moderate in size bilateral simple appearing pleural effusions. Widespread atelectatic changes in the dependent portions of the lungs. More confluent airspace opacity in the right middle lobe may represent round atelectasis/airspace consolidation or less likely pulmonary  mass. Attention on follow-up is recommended. Small amount of free pelvic fluid and generalized mesenteric stranding of the abdomen. No significant abnormalities within the solid abdominal organs noted on this noncontrasted CT. Electronically Signed   By: Ted Mcalpine M.D.   On: 04/29/2016 15:43   Dg Chest 2 View  Result Date: 04/29/2016 CLINICAL DATA:  Weakness and confusion for 3 days. Cough and chest congestion. COPD. EXAM: CHEST  2 VIEW COMPARISON:  11/24/2015 FINDINGS: Moderate enlargement of the cardiopericardial silhouette with bilateral pleural effusions and passive atelectasis. The pleural effusions are mildly increased in size from 11/24/2015. Atherosclerotic calcification of the aortic arch. Prior CABG. Single lead pacer noted. Cervical plate and screw fixator. Suspected underlying emphysema.  No overt edema currently. Mild chronic mid thoracic wedging. Sternal manubrial deformity, chronic. IMPRESSION: 1. Small to moderate-sized bilateral pleural effusions, increased from prior, with associated passive atelectasis. 2. Moderate enlargement of the cardiopericardial silhouette, without overt edema. 3. Prior CABG. 4.  Atherosclerotic calcification of the aortic arch. 5. Emphysema. Electronically Signed   By: Gaylyn Rong M.D.   On: 04/29/2016 11:45   Ct Head Wo  Contrast  Result Date: 04/29/2016 CLINICAL DATA:  Altered mental status EXAM: CT HEAD WITHOUT CONTRAST TECHNIQUE: Contiguous axial images were obtained from the base of the skull through the vertex without intravenous contrast. COMPARISON:  04/05/2015 FINDINGS: Brain: Old left frontal infarct. Mild chronic microvascular disease throughout the deep white matter. No acute intracranial abnormality. Specifically, no hemorrhage, hydrocephalus, mass lesion, acute infarction, or significant intracranial injury. Vascular: No hyperdense vessel or unexpected calcification. Skull: No acute calvarial abnormality. Sinuses/Orbits: Visualized paranasal sinuses and mastoids clear. Orbital soft tissues unremarkable. Other: None IMPRESSION: Old left frontal infarct. Mild chronic small vessel disease. No acute intracranial abnormality. Electronically Signed   By: Charlett Nose M.D.   On: 04/29/2016 12:01   Ct Chest Wo Contrast  Result Date: 04/29/2016 CLINICAL DATA:  Altered mental status, weakness, bilateral pleural effusions, elevated liver function tests. EXAM: CT CHEST, ABDOMEN AND PELVIS WITHOUT CONTRAST TECHNIQUE: Multidetector CT imaging of the chest, abdomen and pelvis was performed following the standard protocol without IV contrast. COMPARISON:  Chest radiograph 04/29/2016 FINDINGS: CT CHEST FINDINGS Cardiovascular: Enlarged heart. No evidence of pericardial effusion. Biventricular pacemaker in place. Heavy calcific atherosclerotic disease of the coronary arteries and thoracic aorta. Post CABG. Mediastinum/Nodes: No enlarged mediastinal, hilar, or axillary lymph nodes. Thyroid gland, trachea, and esophagus demonstrate no significant findings. Lungs/Pleura: Advanced centrilobular emphysema. Mild hyperinflation. Atelectatic changes in the dependent portions of all lobes of the lungs. More confluent airspace opacity in the medial portion of the right middle lobe. Mild interstitial pulmonary edema. Small to moderate in size  bilateral pleural effusions. Musculoskeletal: No chest wall mass or suspicious bone lesions identified. CT ABDOMEN PELVIS FINDINGS Hepatobiliary: No focal liver abnormality is seen. No gallstones, gallbladder wall thickening, or biliary dilatation. Pancreas: Unremarkable. No pancreatic ductal dilatation or surrounding inflammatory changes. Spleen: Normal in size without focal abnormality. Adrenals/Urinary Tract: Adrenal glands are unremarkable. Kidneys are without renal calculi, or hydronephrosis. Hypoattenuated 1.6 cm lesion within the superior pole of the left kidney measures water density and therefore likely represents a cyst. Bladder is unremarkable. Stomach/Bowel: Stomach is within normal limits. No evidence of bowel wall thickening, distention, or inflammatory changes. Vascular/Lymphatic: Aortic atherosclerosis. No enlarged abdominal or pelvic lymph nodes. Reproductive: Status post hysterectomy. No adnexal masses. Other: Small amount of free fluid in the pelvis. Generalized mesenteric stranding in the abdomen and pelvis. Musculoskeletal: No suspicious osseous lesions.  S shaped spinal scoliosis with associated moderate in severity osteoarthritic changes. IMPRESSION: Enlarged heart. Advanced calcific atherosclerotic disease of the coronary arteries and aorta. Advanced centrilobular emphysema and hyperinflation, consistent with COPD. Superimposed mild interstitial pulmonary edema. Moderate in size bilateral simple appearing pleural effusions. Widespread atelectatic changes in the dependent portions of the lungs. More confluent airspace opacity in the right middle lobe may represent round atelectasis/airspace consolidation or less likely pulmonary mass. Attention on follow-up is recommended. Small amount of free pelvic fluid and generalized mesenteric stranding of the abdomen. No significant abnormalities within the solid abdominal organs noted on this noncontrasted CT. Electronically Signed   By: Ted Mcalpine M.D.   On: 04/29/2016 15:43    EKG: Independently reviewed. Fine AFib with normal rate, RSR' in septal leads suggestive of RBBB. Baseline wander. No ischemic changes, similar to previous tracings.  Assessment/Plan Active Problems:   CAP (community acquired pneumonia)  Sepsis due to community-acquired pneumonia: Hypothermic, tachycardic, and hypoxemic on admission with airspace opacity on CT chest. Treat empirically for typical bacterial pathogens. No indication for respiratory viral panel. - Continue ceftriaxone, azithromycin (has not been admitted in past 90 days) - Urine pneumococcus, legionella antigens - Blood and sputum cultures (1/27 - )  Acute hypoxemic respiratory failure: Multifactorial, due to PNA and compressive atelectasis/bilateral pleural effusions in addition to COPD.  - Supplemental oxygen as needed for hypoxemia; goal SpO2: >90% - No wheezing/indication for steroids. - Duoneb PRN   Elevated LFT's: Unclear etiology of painless jaundice, but no evidence of obstructive jaundice on non-contrast CT. Possibly due to systemic infection. ?drug effect.  - Acute hepatitis panel sent - Will trend CMP and consult GI if they fail to trend downward. - Pt on percocet, will check tylenol level - Will hold crestor; LFT elevation noted in ~2% of pt's.  - Per pharmacy, mirtazapine also associated with LFT abnormalities, so will make that prn tonight.  Permanent AFib: And tachy/brady with PPM in Aug 2017. Currently rate-controlled. - CHADSVASc 8: Continue eliquis - Continue metoprolol and diltiazem and digoxin. (Level 0.8 on admission)  - History of RVR during acute illness: Monitor on telemetry  CAD s/p CABGx2, ischemic cardiomyopathy, chronic combined systolic and diastolic CHF: Wt here 74lbs. Outpatient weights have been 77-81 in past 6 months. BNP elevated at 1004 from 460 previously but is clinically euvolemic. Echo Aug 2017 w/EF 45-50% which may have been underestimated  due to ventricular ectopy.  - Check troponin  - Takes lasix QOD. Will hold for now. - Continue beta blocker, statin, diltiazem - Daily weight, strict I/O  History of CVA: CT head showed old infarct with microvascular disease, no new infarct. - Continue statin, eliquis.   CKD stage III:  - Monitor BMP - Avoid nephrotoxins  HTN: Chronic, stable. Normotensive in ED.  - Continue home medications  Microcytic anemia: Appears chronic, suspected due to iron deficiency. No signs/symptoms of bleeding.  - Continue iron - Monitor CBC  Chronic mid back pain: Getting PT as outpatient.  - Continue robaxin and percocet as needed only - With general weakness, will order PT/OT evaluation  Hypothyroidism: Chronic, stable. TSH wnl in Aug 2017 - Continue synthroid  Severe protein-calorie malnutrition: Pt weighs 74 lbs.  - Ordered boost  DVT prophylaxis: Eliquis  Code Status: DNR confirmed at admission Family Communication: Daughter at bedside  Disposition Plan: Uncertain Consults called: None  Admission status: Inpatient   Hazeline Junker, MD [facility] Pager [phone]  If 7PM-7AM, please contact night-coverage [email or web address] Password [email or web address]  04/29/2016, 4:49 PM

## 2016-04-29 NOTE — ED Notes (Signed)
Patient transported to CT 

## 2016-04-29 NOTE — ED Notes (Signed)
She returns from CT at this time. She remains in no distress; and her daughter remains at her bedside.

## 2016-04-29 NOTE — ED Triage Notes (Signed)
Her daughter states pt. Has been weak, plus midly confused at times for past ~3 days. Pt. Is pale and possibly jaundiced and in no distress. Pt. Is able to answer questions appropriately, and is a bit slow in her responses.

## 2016-04-30 DIAGNOSIS — I5032 Chronic diastolic (congestive) heart failure: Secondary | ICD-10-CM

## 2016-04-30 DIAGNOSIS — Z8673 Personal history of transient ischemic attack (TIA), and cerebral infarction without residual deficits: Secondary | ICD-10-CM

## 2016-04-30 DIAGNOSIS — J189 Pneumonia, unspecified organism: Secondary | ICD-10-CM

## 2016-04-30 DIAGNOSIS — E039 Hypothyroidism, unspecified: Secondary | ICD-10-CM

## 2016-04-30 DIAGNOSIS — I482 Chronic atrial fibrillation: Secondary | ICD-10-CM

## 2016-04-30 DIAGNOSIS — I10 Essential (primary) hypertension: Secondary | ICD-10-CM

## 2016-04-30 DIAGNOSIS — N183 Chronic kidney disease, stage 3 (moderate): Secondary | ICD-10-CM

## 2016-04-30 DIAGNOSIS — G9340 Encephalopathy, unspecified: Secondary | ICD-10-CM

## 2016-04-30 LAB — COMPREHENSIVE METABOLIC PANEL
ALT: 394 U/L — ABNORMAL HIGH (ref 14–54)
AST: 305 U/L — ABNORMAL HIGH (ref 15–41)
Albumin: 3.2 g/dL — ABNORMAL LOW (ref 3.5–5.0)
Alkaline Phosphatase: 125 U/L (ref 38–126)
Anion gap: 6 (ref 5–15)
BILIRUBIN TOTAL: 2.1 mg/dL — AB (ref 0.3–1.2)
BUN: 22 mg/dL — AB (ref 6–20)
CO2: 31 mmol/L (ref 22–32)
Calcium: 7.7 mg/dL — ABNORMAL LOW (ref 8.9–10.3)
Chloride: 101 mmol/L (ref 101–111)
Creatinine, Ser: 0.81 mg/dL (ref 0.44–1.00)
GFR calc Af Amer: >60 mL/min (ref >60)
Glucose, Bld: 105 mg/dL — ABNORMAL HIGH (ref 65–99)
POTASSIUM: 3.7 mmol/L (ref 3.5–5.1)
Sodium: 138 mmol/L (ref 135–145)
TOTAL PROTEIN: 5.4 g/dL — AB (ref 6.5–8.1)

## 2016-04-30 LAB — HEPATITIS PANEL, ACUTE
HEP A IGM: NEGATIVE
Hep B C IgM: NEGATIVE
Hepatitis B Surface Ag: NEGATIVE

## 2016-04-30 LAB — CBC
HCT: 27.7 % — ABNORMAL LOW (ref 36.0–46.0)
Hemoglobin: 7.8 g/dL — ABNORMAL LOW (ref 12.0–15.0)
MCH: 22.7 pg — ABNORMAL LOW (ref 26.0–34.0)
MCHC: 28.2 g/dL — ABNORMAL LOW (ref 30.0–36.0)
MCV: 80.5 fL (ref 78.0–100.0)
Platelets: 196 10*3/uL (ref 150–400)
RBC: 3.44 MIL/uL — ABNORMAL LOW (ref 3.87–5.11)
RDW: 19.3 % — ABNORMAL HIGH (ref 11.5–15.5)
WBC: 6.8 10*3/uL (ref 4.0–10.5)

## 2016-04-30 LAB — STREP PNEUMONIAE URINARY ANTIGEN: Strep Pneumo Urinary Antigen: NEGATIVE

## 2016-04-30 LAB — HIV ANTIBODY (ROUTINE TESTING W REFLEX): HIV SCREEN 4TH GENERATION: NONREACTIVE

## 2016-04-30 MED ORDER — ONDANSETRON HCL 4 MG/2ML IJ SOLN
4.0000 mg | Freq: Four times a day (QID) | INTRAMUSCULAR | Status: DC | PRN
  Administered 2016-04-30: 4 mg via INTRAVENOUS
  Filled 2016-04-30: qty 2

## 2016-04-30 MED ORDER — BUDESONIDE 0.25 MG/2ML IN SUSP
0.2500 mg | Freq: Two times a day (BID) | RESPIRATORY_TRACT | Status: DC
  Administered 2016-04-30 – 2016-05-01 (×2): 0.25 mg via RESPIRATORY_TRACT
  Filled 2016-04-30 (×2): qty 2

## 2016-04-30 NOTE — Progress Notes (Signed)
Notified by NT that patient oxygen sat 84 on room air.  Applied [place] at 2L.  Sats came up to 97.  [place] maintained at 1L.  Will continue to monitor.  Educated patient the importance of keeping nasal cannula on.  Patient and her daughter report understanding.

## 2016-04-30 NOTE — Progress Notes (Signed)
Rounding on patient and she does not have her nasal cannula on.  Sats 84.  Applied [place] 2L.  Increased oxygen sats to 93.  Educated patient the importance of keeping her oxygen on.  Will continue to monitor.

## 2016-04-30 NOTE — Progress Notes (Signed)
Patient refused bed alarm.  She states, " I gonna  walk out of here if this alarm keeps going off". Patient educated about fall safety and instructed to use call bell for assistants

## 2016-04-30 NOTE — Evaluation (Signed)
Physical Therapy Evaluation Patient Details Name: [name] MRN: [id number] DOB: 1940-12-12 Today's Date: 04/30/2016   History of Present Illness  [name] is a 76 y.o. female with a history of chronic AFib on eliquis, CAD s/p CABG, chronic CHF, COPD, h/o CVA, CKD among others who is brought by her daughter for evaluation of several days of confusion and weakness.   Clinical Impression  Pt admitted with above diagnosis. Pt currently with functional limitations due to the deficits listed below (see PT Problem List). * Pt will benefit from skilled PT to increase their independence and safety with mobility to allow discharge to the venue listed below.   Pt presents with very limited endurance, anxious and too fatigued to do more than amb to/from bathroom; recommend pt return to OP PT upon D/C, will follow in acute setting    Follow Up Recommendations Outpatient PT (pt was going to OP prior but only 1visit)    Equipment Recommendations  None recommended by PT    Recommendations for Other Services       Precautions / Restrictions Precautions Precautions: Fall Precaution Comments: O2      Mobility  Bed Mobility Overal bed mobility: Needs Assistance Bed Mobility: Sit to Supine       Sit to supine: Supervision      Transfers Overall transfer level: Needs assistance Equipment used: None (or IV pole) Transfers: Sit to/from Stand Sit to Stand: Supervision         General transfer comment: pt rises from [facility] without assist but appears unsteady on PT arrival  Ambulation/Gait Ambulation/Gait assistance: Min guard Ambulation Distance (Feet): 10 Feet Assistive device: None Gait Pattern/deviations: Step-through pattern;Decreased stride length;Trunk flexed     General Gait Details: pt is unsteady and extremely fatigued, 3/4 DOE, declines to amb any further, 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, maintenance (IT) Rankin (Stroke Patients Only)       Balance Overall balance assessment: Needs assistance   Sitting balance-Leahy Scale: Good       Standing balance-Leahy Scale: Fair                               Pertinent Vitals/Pain Pain Assessment: 0-10 Pain Score: 7  Pain Descriptors / Indicators: Cramping;Jabbing;Spasm Pain Intervention(s): Limited activity within patient's tolerance;Monitored during session;RN gave pain meds during session    Home Living Family/patient expects to be discharged to:: Private residence Living Arrangements: Children (dtr) Available Help at Discharge: Family;Available 24 hours/day (dtr and grandson) Type of Home: House Home Access: Level entry     Home Layout: Two level;Able to live on main level with bedroom/bathroom Home Equipment: Tub bench Additional Comments: uses no AD at home    Prior Function Level of Independence: Needs assistance   Gait / Transfers Assistance Needed: walked with no AD but slowly           Hand Dominance        Extremity/Trunk Assessment   Upper Extremity Assessment Upper Extremity Assessment: Generalized weakness    Lower Extremity Assessment Lower Extremity Assessment: Generalized weakness (appears diffuse atrophy)       Communication   Communication: No difficulties  Cognition Arousal/Alertness: Awake/alert Behavior During Therapy: Anxious Overall Cognitive Status: Within Functional Limits for tasks assessed  General Comments      Exercises     Assessment/Plan    PT Assessment Patient needs continued PT services  PT Problem List Decreased strength;Decreased activity tolerance;Decreased balance;Decreased mobility;Cardiopulmonary status limiting activity          PT Treatment Interventions Gait training;Functional mobility training;Therapeutic exercise;Therapeutic activities;Patient/family education    PT Goals (Current goals can be found in the Care Plan section)  Acute Rehab PT  Goals Patient Stated Goal: to feel better, less pain PT Goal Formulation: With patient Time For Goal Achievement: 05/07/16 Potential to Achieve Goals: Good    Frequency Min 3X/week   Barriers to discharge        Co-evaluation               End of Session   Activity Tolerance: Patient limited by fatigue;Patient limited by pain Patient left: in bed;with call bell/phone within reach;with nursing/sitter in room;with family/visitor present           Time: 1520-1533 PT Time Calculation (min) (ACUTE ONLY): 13 min   Charges:   PT Evaluation $PT Eval Low Complexity: 1 Procedure     PT G Codes:        Myrene Bougher 04/30/2016, 3:49 PM

## 2016-04-30 NOTE — Progress Notes (Signed)
Pt demonstrated hands on understanding of Flutter device. 

## 2016-04-30 NOTE — Progress Notes (Signed)
[facility] PROGRESS NOTE  [name] MWU:[id number] DOB: 02-19-1941 DOA: 04/29/2016 PCP: [name], MD  interim summary and HPI 76 y.o. female with a history of chronic AFib on eliquis, CAD s/p CABG, chronic CHF, COPD, h/o CVA, CKD among others who is brought by her daughter for evaluation of several days of confusion and weakness. Also with complaints of nausea/vomitng and SOB. Found to have Sepsis due to CAP.  Assessment/Plan:  Sepsis due to community-acquired pneumonia: Hypothermic, tachycardic, and hypoxemic on admission with airspace opacity on CT chest and some encephalopathy.  -neg influenza -will continue ceftriaxone, azithromycin (has not been admitted in past 90 days) -follow results for Urine pneumococcus, legionella antigens and Blood and sputum cultures (1/27) -patient feeling better and improving.  Acute hypoxemic respiratory failure: Multifactorial, due to PNA and compressive atelectasis/bilateral pleural effusions in addition to COPD.  - Supplemental oxygen as needed for hypoxemia; goal SpO2: >90% -currently not requiring O2 supplementation -will continue PRN duoneb and start patient on pulmicort   Elevated LFT's: Unclear etiology of painless jaundice, but no evidence of obstructive jaundice on non-contrast CT. 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  - Acute hepatitis panel sent and pending  - Will trend CMP and consult GI if they fail to trend downward. - Pt on percocet, but tylenol < 10 - continue holding crestor.  - Per pharmacy, mirtazapine also associated with LFT abnormalities, so will make that prn tonight.  Permanent AFib: And tachy/brady with PPM in Aug 2017. Currently rate-controlled. - CHADSVASc 8: Continue eliquis - Continue metoprolol and diltiazem and digoxin. (Level 0.8 on admission)  - History of RVR during acute illness: will Monitor on telemetry  CAD s/p  CABGx2, ischemic cardiomyopathy, chronic combined systolic and diastolic CHF: Wt here 74lbs. Outpatient weights have been 77-81 in past 6 months. BNP elevated at 1004 from 460 previously but is clinically euvolemic. Echo Aug 2017 w/EF 45-50% which may have been underestimated due to ventricular ectopy.  -Takes lasix QOD. Will resume in am; no signs of fluid overload (in the opposite, quite dry) -Continue beta blocker, statin, diltiazem -Daily weight, strict I/O  History of CVA: CT head showed old infarct with microvascular disease, no new infarct. -Continue statin and eliquis for secondary prevention.  -no acute neurologic changes  CKD stage III:  -stable and at baseline  -Monitor BMP -Avoid nephrotoxins  HTN: Chronic, stable.  -Continue home medications -patient needs pediatric cuff for accurate BP  Microcytic anemia: Appears chronic, suspected due to iron deficiency.  -No signs/symptoms of acute blood loss   -Continue home iron -Monitor CBC trend  Chronic mid back pain: Getting PT as outpatient.  -Continue robaxin and percocet as needed only -continue outpatient PT  Hypothyroidism:  -Chronic, stable.  -Continue synthroid  Severe protein-calorie malnutrition:  -Body mass index is 14.95 kg/m. -patient encourage to increase Po intake -will continue feeding supplements   Code Status: DNR Family Communication: daughter at bedside  Disposition Plan: home in the next day or two, feeling better overall, but still with SOB and wheezing. Also with nausea. Mentation is back to baseline    Consultants:  None   Procedures:  See below for x-ray reports   Antibiotics:  Rocephin and Zithromax 1/27  HPI/Subjective: Feeling better. Still with some SOB (especially with exertion); denies CP and is refusing oxygen supplementation.  Objective: Vitals:   04/30/16 0516 04/30/16 1116  BP: (!) 93/56 113/67  Pulse: 78 80  Resp: 18   Temp: 98 F (36.7 C)      Intake/Output Summary (Last 24 hours) at 04/30/16 1328 Last data filed at 04/30/16 0730  Gross per 24 hour  Intake              240 ml  Output                0 ml  Net              240 ml   Filed Weights   04/29/16 1029  Weight: 33.6 kg (74 lb)    Exam:   General:  Cachetic, chronically ill in appearance. Afebrile, no CP, reporting improvement in her breathing and oriented X 3; still feeling SOB with minimal activity.  Cardiovascular: irregular, with positive SEM, rate controlled   Respiratory: diffuse rhonchi, positive exp wheezing, no crackles  Abdomen: soft, NT, ND, positive BS  Musculoskeletal: no edema, no cyanosis   Data Reviewed: Basic Metabolic Panel:  Recent Labs Lab 04/29/16 1120 04/30/16 0549  NA 138 138  K 4.1 3.7  CL 102 101  CO2 30 31  GLUCOSE 159* 105*  BUN 39* 22*  CREATININE 1.23* 0.81  CALCIUM 7.9* 7.7*   Liver Function Tests:  Recent Labs Lab 04/29/16 1120 04/30/16 0549  AST 557* 305*  ALT 602* 394*  ALKPHOS 140* 125  BILITOT 2.5* 2.1*  PROT 5.7* 5.4*  ALBUMIN 3.8 3.2*    Recent Labs Lab 04/29/16 1223  AMMONIA 38*   CBC:  Recent Labs Lab 04/29/16 1120 04/30/16 0549  WBC 6.5 6.8  NEUTROABS 5.4  --   HGB 8.8* 7.8*  HCT 30.6* 27.7*  MCV 80.7 80.5  PLT 257 196   Cardiac Enzymes:  Recent Labs Lab 04/29/16 1736  TROPONINI <0.03   BNP (last 3 results)  Recent Labs  11/16/15 2350 04/29/16 1120  BNP 460.0* 1,004.0*   CBG:  Recent Labs Lab 04/29/16 1026  GLUCAP 169*   Studies: Ct Abdomen Pelvis Wo Contrast  Result Date: 04/29/2016 CLINICAL DATA:  Altered mental status, weakness, bilateral pleural effusions, elevated liver function tests. EXAM: CT CHEST, ABDOMEN AND PELVIS WITHOUT CONTRAST TECHNIQUE: Multidetector CT imaging of the chest, abdomen and pelvis was performed following the standard protocol without IV contrast. COMPARISON:  Chest radiograph 04/29/2016 FINDINGS: CT CHEST FINDINGS Cardiovascular:  Enlarged heart. No evidence of pericardial effusion. Biventricular pacemaker in place. Heavy calcific atherosclerotic disease of the coronary arteries and thoracic aorta. Post CABG. Mediastinum/Nodes: No enlarged mediastinal, hilar, or axillary lymph nodes. Thyroid gland, trachea, and esophagus demonstrate no significant findings. Lungs/Pleura: Advanced centrilobular emphysema. Mild hyperinflation. Atelectatic changes in the dependent portions of all lobes of the lungs. More confluent airspace opacity in the medial portion of the right middle lobe. Mild interstitial pulmonary edema. Small to moderate in size bilateral pleural effusions. Musculoskeletal: No chest wall mass or suspicious bone lesions identified. CT ABDOMEN PELVIS FINDINGS Hepatobiliary: No focal liver abnormality is seen. No gallstones, gallbladder wall thickening, or biliary dilatation. Pancreas: Unremarkable. No pancreatic ductal dilatation or surrounding inflammatory changes. Spleen: Normal in size without focal abnormality. Adrenals/Urinary Tract: Adrenal glands are unremarkable. Kidneys are without renal calculi, or hydronephrosis. Hypoattenuated 1.6 cm lesion within the superior pole of the left kidney measures water density and therefore likely represents a cyst. Bladder is unremarkable. Stomach/Bowel: Stomach is within normal limits. No evidence of bowel wall thickening, distention, or inflammatory changes. Vascular/Lymphatic: Aortic atherosclerosis. No enlarged abdominal or pelvic lymph nodes. Reproductive: Status post hysterectomy. No  adnexal masses. Other: Small amount of free fluid in the pelvis. Generalized mesenteric stranding in the abdomen and pelvis. Musculoskeletal: No suspicious osseous lesions. S shaped spinal scoliosis with associated moderate in severity osteoarthritic changes. IMPRESSION: Enlarged heart. Advanced calcific atherosclerotic disease of the coronary arteries and aorta. Advanced centrilobular emphysema and  hyperinflation, consistent with COPD. Superimposed mild interstitial pulmonary edema. Moderate in size bilateral simple appearing pleural effusions. Widespread atelectatic changes in the dependent portions of the lungs. More confluent airspace opacity in the right middle lobe may represent round atelectasis/airspace consolidation or less likely pulmonary mass. Attention on follow-up is recommended. Small amount of free pelvic fluid and generalized mesenteric stranding of the abdomen. No significant abnormalities within the solid abdominal organs noted on this noncontrasted CT. Electronically Signed   By: Ted Mcalpine M.D.   On: 04/29/2016 15:43   Dg Chest 2 View  Result Date: 04/29/2016 CLINICAL DATA:  Weakness and confusion for 3 days. Cough and chest congestion. COPD. EXAM: CHEST  2 VIEW COMPARISON:  11/24/2015 FINDINGS: Moderate enlargement of the cardiopericardial silhouette with bilateral pleural effusions and passive atelectasis. The pleural effusions are mildly increased in size from 11/24/2015. Atherosclerotic calcification of the aortic arch. Prior CABG. Single lead pacer noted. Cervical plate and screw fixator. Suspected underlying emphysema.  No overt edema currently. Mild chronic mid thoracic wedging. Sternal manubrial deformity, chronic. IMPRESSION: 1. Small to moderate-sized bilateral pleural effusions, increased from prior, with associated passive atelectasis. 2. Moderate enlargement of the cardiopericardial silhouette, without overt edema. 3. Prior CABG. 4.  Atherosclerotic calcification of the aortic arch. 5. Emphysema. Electronically Signed   By: Gaylyn Rong M.D.   On: 04/29/2016 11:45   Ct Head Wo Contrast  Result Date: 04/29/2016 CLINICAL DATA:  Altered mental status EXAM: CT HEAD WITHOUT CONTRAST TECHNIQUE: Contiguous axial images were obtained from the base of the skull through the vertex without intravenous contrast. COMPARISON:  04/05/2015 FINDINGS: Brain: Old left  frontal infarct. Mild chronic microvascular disease throughout the deep white matter. No acute intracranial abnormality. Specifically, no hemorrhage, hydrocephalus, mass lesion, acute infarction, or significant intracranial injury. Vascular: No hyperdense vessel or unexpected calcification. Skull: No acute calvarial abnormality. Sinuses/Orbits: Visualized paranasal sinuses and mastoids clear. Orbital soft tissues unremarkable. Other: None IMPRESSION: Old left frontal infarct. Mild chronic small vessel disease. No acute intracranial abnormality. Electronically Signed   By: Charlett Nose M.D.   On: 04/29/2016 12:01   Ct Chest Wo Contrast  Result Date: 04/29/2016 CLINICAL DATA:  Altered mental status, weakness, bilateral pleural effusions, elevated liver function tests. EXAM: CT CHEST, ABDOMEN AND PELVIS WITHOUT CONTRAST TECHNIQUE: Multidetector CT imaging of the chest, abdomen and pelvis was performed following the standard protocol without IV contrast. COMPARISON:  Chest radiograph 04/29/2016 FINDINGS: CT CHEST FINDINGS Cardiovascular: Enlarged heart. No evidence of pericardial effusion. Biventricular pacemaker in place. Heavy calcific atherosclerotic disease of the coronary arteries and thoracic aorta. Post CABG. Mediastinum/Nodes: No enlarged mediastinal, hilar, or axillary lymph nodes. Thyroid gland, trachea, and esophagus demonstrate no significant findings. Lungs/Pleura: Advanced centrilobular emphysema. Mild hyperinflation. Atelectatic changes in the dependent portions of all lobes of the lungs. More confluent airspace opacity in the medial portion of the right middle lobe. Mild interstitial pulmonary edema. Small to moderate in size bilateral pleural effusions. Musculoskeletal: No chest wall mass or suspicious bone lesions identified. CT ABDOMEN PELVIS FINDINGS Hepatobiliary: No focal liver abnormality is seen. No gallstones, gallbladder wall thickening, or biliary dilatation. Pancreas: Unremarkable. No  pancreatic ductal dilatation or surrounding inflammatory changes. Spleen:  Normal in size without focal abnormality. Adrenals/Urinary Tract: Adrenal glands are unremarkable. Kidneys are without renal calculi, or hydronephrosis. Hypoattenuated 1.6 cm lesion within the superior pole of the left kidney measures water density and therefore likely represents a cyst. Bladder is unremarkable. Stomach/Bowel: Stomach is within normal limits. No evidence of bowel wall thickening, distention, or inflammatory changes. Vascular/Lymphatic: Aortic atherosclerosis. No enlarged abdominal or pelvic lymph nodes. Reproductive: Status post hysterectomy. No adnexal masses. Other: Small amount of free fluid in the pelvis. Generalized mesenteric stranding in the abdomen and pelvis. Musculoskeletal: No suspicious osseous lesions. S shaped spinal scoliosis with associated moderate in severity osteoarthritic changes. IMPRESSION: Enlarged heart. Advanced calcific atherosclerotic disease of the coronary arteries and aorta. Advanced centrilobular emphysema and hyperinflation, consistent with COPD. Superimposed mild interstitial pulmonary edema. Moderate in size bilateral simple appearing pleural effusions. Widespread atelectatic changes in the dependent portions of the lungs. More confluent airspace opacity in the right middle lobe may represent round atelectasis/airspace consolidation or less likely pulmonary mass. Attention on follow-up is recommended. Small amount of free pelvic fluid and generalized mesenteric stranding of the abdomen. No significant abnormalities within the solid abdominal organs noted on this noncontrasted CT. Electronically Signed   By: Ted Mcalpine M.D.   On: 04/29/2016 15:43    Scheduled Meds: . apixaban  5 mg Oral BID  . azithromycin  500 mg Oral Q24H  . budesonide (PULMICORT) nebulizer solution  0.25 mg Nebulization BID  . cefTRIAXone (ROCEPHIN)  IV  1 g Intravenous Q24H  . digoxin  62.5 mcg Oral Daily   . diltiazem  180 mg Oral Daily  . feeding supplement  1 Container Oral TID BM  . ferrous sulfate  325 mg Oral Q breakfast  . levothyroxine  37.5 mcg Oral QAC breakfast  . metoprolol tartrate  75 mg Oral TID  . sodium chloride flush  3 mL Intravenous Q12H   Continuous Infusions:  Principal Problem:   Acute hypoxemic respiratory failure (HCC) Active Problems:   2 Vessel CAD - s/p CABG; LIMA-LAD, SVG-RCA   Essential hypertension   Hypothyroidism   CKD (chronic kidney disease) stage 3, GFR 30-59 ml/min   H/O ischemic left MCA stroke   Acute encephalopathy   Chronic atrial fibrillation (HCC)   CAP (community acquired pneumonia)   Pleural effusion   Chronic diastolic CHF (congestive heart failure) (HCC)    Time spent: 25 minutes    Vassie Loll  [facility] Pager [phone]. If 7PM-7AM, please contact night-coverage at [email or web address], password [facility] 04/30/2016, 1:28 PM  LOS: 1 day

## 2016-05-01 DIAGNOSIS — I4891 Unspecified atrial fibrillation: Secondary | ICD-10-CM

## 2016-05-01 DIAGNOSIS — J9601 Acute respiratory failure with hypoxia: Secondary | ICD-10-CM

## 2016-05-01 DIAGNOSIS — I509 Heart failure, unspecified: Secondary | ICD-10-CM

## 2016-05-01 DIAGNOSIS — J9621 Acute and chronic respiratory failure with hypoxia: Secondary | ICD-10-CM

## 2016-05-01 DIAGNOSIS — I2510 Atherosclerotic heart disease of native coronary artery without angina pectoris: Secondary | ICD-10-CM

## 2016-05-01 DIAGNOSIS — A419 Sepsis, unspecified organism: Principal | ICD-10-CM

## 2016-05-01 LAB — COMPREHENSIVE METABOLIC PANEL
ALT: 320 U/L — ABNORMAL HIGH (ref 14–54)
ANION GAP: 6 (ref 5–15)
AST: 181 U/L — ABNORMAL HIGH (ref 15–41)
Albumin: 3.2 g/dL — ABNORMAL LOW (ref 3.5–5.0)
Alkaline Phosphatase: 125 U/L (ref 38–126)
BUN: 16 mg/dL (ref 6–20)
CHLORIDE: 100 mmol/L — AB (ref 101–111)
CO2: 32 mmol/L (ref 22–32)
Calcium: 8 mg/dL — ABNORMAL LOW (ref 8.9–10.3)
Creatinine, Ser: 0.76 mg/dL (ref 0.44–1.00)
Glucose, Bld: 95 mg/dL (ref 65–99)
POTASSIUM: 3.8 mmol/L (ref 3.5–5.1)
SODIUM: 138 mmol/L (ref 135–145)
Total Bilirubin: 1.6 mg/dL — ABNORMAL HIGH (ref 0.3–1.2)
Total Protein: 5.4 g/dL — ABNORMAL LOW (ref 6.5–8.1)

## 2016-05-01 LAB — LEGIONELLA PNEUMOPHILA SEROGP 1 UR AG: L. PNEUMOPHILA SEROGP 1 UR AG: NEGATIVE

## 2016-05-01 LAB — CBC
HCT: 30 % — ABNORMAL LOW (ref 36.0–46.0)
Hemoglobin: 8.5 g/dL — ABNORMAL LOW (ref 12.0–15.0)
MCH: 22.8 pg — AB (ref 26.0–34.0)
MCHC: 28.3 g/dL — ABNORMAL LOW (ref 30.0–36.0)
MCV: 80.6 fL (ref 78.0–100.0)
PLATELETS: 210 10*3/uL (ref 150–400)
RBC: 3.72 MIL/uL — ABNORMAL LOW (ref 3.87–5.11)
RDW: 19.3 % — AB (ref 11.5–15.5)
WBC: 4.9 10*3/uL (ref 4.0–10.5)

## 2016-05-01 MED ORDER — POTASSIUM CHLORIDE ER 10 MEQ PO TBCR: 10.0000 meq | EXTENDED_RELEASE_TABLET | ORAL | Status: AC

## 2016-05-01 MED ORDER — IPRATROPIUM-ALBUTEROL 20-100 MCG/ACT IN AERS: 1.0000 | INHALATION_SPRAY | Freq: Four times a day (QID) | RESPIRATORY_TRACT | 1 refills | Status: AC | PRN

## 2016-05-01 MED ORDER — AMOXICILLIN-POT CLAVULANATE 875-125 MG PO TABS: 1.0000 | ORAL_TABLET | Freq: Two times a day (BID) | ORAL | 0 refills | Status: DC

## 2016-05-01 MED ORDER — SACCHAROMYCES BOULARDII 250 MG PO CAPS: 250.0000 mg | ORAL_CAPSULE | Freq: Two times a day (BID) | ORAL | 0 refills | Status: AC

## 2016-05-01 NOTE — Progress Notes (Signed)
OT Cancellation Note  Patient Details Name: [name] MRN: [id number] DOB: 08/07/1940   Cancelled Treatment:    Reason Eval/Treat Not Completed: OT screened, no needs identified, will sign off  Galen ManilaSpencer, Jossalin Chervenak Jeanette 05/01/2016, 11:44 AM

## 2016-05-01 NOTE — Discharge Summary (Signed)
Physician Discharge Summary  [name] WUJ:[id number] DOB: 10-14-1940 DOA: 04/29/2016  PCP: [name], MD  Admit date: 04/29/2016 Discharge date: 05/01/2016  Time spent: 35 minutes  Recommendations for Outpatient Follow-up:  1. Repeat CMET to follow electrolytes, LFT's and renal function  2. Follow up oxygen needs and if required arrange outpatient visit with pulmonary service (if hypoxia and chronic obstructive features persisted) 3. Patient discharge on combivent   Discharge Diagnoses:  Principal Problem:   Acute hypoxemic respiratory failure (HCC) Active Problems:   2 Vessel CAD - s/p CABG; LIMA-LAD, SVG-RCA   Essential hypertension   Hypothyroidism   CKD (chronic kidney disease) stage 3, GFR 30-59 ml/min   H/O ischemic left MCA stroke   Acute encephalopathy   Chronic atrial fibrillation (HCC)   CAP (community acquired pneumonia)   Pleural effusion   Chronic diastolic CHF (congestive heart failure) (HCC)   Discharge Condition: stable and improved. Discharge home with instructions to follow up with PCP in 10 days.  Diet recommendation: heart healthy diet   Filed Weights   04/29/16 1029  Weight: 33.6 kg (74 lb)    History of present illness:  76 y.o.femalewith a history of chronic AFib on eliquis, CAD s/p CABG, chronic CHF, COPD, h/o CVA, CKD among others who is brought by her daughter for evaluation of several days of confusion and weakness. Also with complaints of nausea/vomitng and SOB. Found to have Sepsis due to CAP.  Hospital Course:  Sepsis due to community-acquired pneumonia: Hypothermic, tachycardic, and hypoxemic on admission with airspace opacity on CT chest and some encephalopathy due to low oxygenation and infection most likely.  -neg influenza -will discharge on Augmentin  -will follow final results for Urine pneumococcus, legionella antigens and Blood and sputum cultures(1/27); but so far neg growth -sepsis features resolved -patient feeling much better  and will like to go home  Encephalopathy: acute and related to low oxygen and acute infection most likely  -resolved at discharge -will continue abx's for tx of PNA as mentioned above -continue oxygen supplementation to keep O2 sat above 90%  Acute on chronic respiratory failure: with hypoxia. Multifactorial, due to PNA and compressive atelectasis/bilateral pleural effusions in addition to COPD.  - Supplemental oxygen 2L 24/7 for now; then resume night time oxygen supplementation -will continue treatment for acute PNA -will discharge on PRN combivent   Elevated LFT's: Unclear etiology of painless jaundice, but no evidence of obstructive jaundice on non-contrast CT. 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  - Acute hepatitis panel negative - Pt on percocet, but tylenol < 10 - Per pharmacy, mirtazapine also associated with LFT abnormalities; but not at new drug. 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 Currently rate-controlled. - CHADSVASc 8: Continue eliquis - Continue metoprolol and diltiazem and digoxin. (Level 0.8 on admission)   CAD s/p CABGx2, ischemic cardiomyopathy, chronic combined systolic and diastolic CHF: Wt here 74lbs. Outpatient weights have been 77-81 in past 6 months. BNP elevated at 1004 from 460 previously but is clinically euvolemic.Echo Aug 2017 w/EF 45-50% which may have beenunderestimated due to ventricular ectopy.  -Takes lasix every other day; resumed at discharge -Continue beta blocker, statin, diltiazem -instructed to check Daily weight and follow low sodium diet  History of CVA: CT head showed old infarct with microvascular disease, no new infarct. -Continue statin and eliquis for secondary prevention.  -no acute neurologic changes  CKD stage III:  -stable and at baseline  -Monitor BMP at follow up visit -Avoid  nephrotoxins  HTN: Chronic, stable.  -Continue home medications  Microcytic anemia: Appears chronic, suspected due to iron deficiency.  -No signs/symptoms of acute blood loss   -Continue home iron supplementation -Monitor CBC trend as an outpatient  -Hgb 8.5, no need for transfusion during hospitalization   Chronic mid back pain:Getting PT as outpatient.  -Continue robaxin and percocet as needed only -continue outpatient PT  Hypothyroidism:  -Chronic, stable.  -Continue synthroid  Severe protein-calorie malnutrition:  -Body mass index is 14.95 kg/m. -patient encourage to increase Po intake -will continue feeding supplements   Procedures:  See below for x-ray reports   Consultations:  None   Discharge Exam: Vitals:   05/01/16 1019 05/01/16 1127  BP: (!) 98/53   Pulse: 69 72  Resp:  18  Temp:      General:  Cachetic, chronically ill in appearance. Afebrile, no CP, reporting improvement in her breathing and oriented X 3; Still with some SOB with activity and requiring 2L  to maintain O2 sat above 90%  Cardiovascular: irregular, with positive SEM, rate controlled   Respiratory: diffuse rhonchi, no frank wheezing, no crackles  Abdomen: soft, NT, ND, positive BS  Musculoskeletal: no edema, no cyanosis    Discharge Instructions   Discharge Instructions    Diet - low sodium heart healthy    Complete by:  As directed    Discharge instructions    Complete by:  As directed    Keep yourself well hydrated Take medications as prescribed Keep oxygen 24/7 for the next 5 days and weaned it off to only at night as previously used, base on on you O2 sat and tolerance. Please arrange follow up with PCP in 10 days     Current Discharge Medication List    START taking these medications   Details  amoxicillin-clavulanate (AUGMENTIN) 875-125 MG tablet Take 1 tablet by mouth 2 (two) times daily. Qty: 12 tablet, Refills: 0    Ipratropium-Albuterol (COMBIVENT  RESPIMAT) 20-100 MCG/ACT AERS respimat Inhale 1 puff into the lungs every 6 (six) hours as needed for wheezing or shortness of breath. Qty: 1 Inhaler, Refills: 1    saccharomyces boulardii (FLORASTOR) 250 MG capsule Take 1 capsule (250 mg total) by mouth 2 (two) times daily. Qty: 40 capsule, Refills: 0      CONTINUE these medications which have CHANGED   Details  potassium chloride (K-DUR) 10 MEQ tablet Take 1 tablet (10 mEq total) by mouth every other day.      CONTINUE these medications which have NOT CHANGED   Details  apixaban (ELIQUIS) 5 MG TABS tablet Take 1 tablet (5 mg total) by mouth 2 (two) times daily. Qty: 60 tablet, Refills: 0    Cholecalciferol (VITAMIN D3) 2000 units TABS Take 1 tablet by mouth daily.    diltiazem (CARDIZEM CD) 180 MG 24 hr capsule Take 1 capsule (180 mg total) by mouth daily. Qty: 90 capsule, Refills: 3    ferrous sulfate 325 (65 FE) MG tablet Take 1 tablet (325 mg total) by mouth daily with breakfast. Qty: 30 tablet, Refills: 3    furosemide (LASIX) 20 MG tablet Take 1 tablet (20 mg total) by mouth every other day. Take extra dose if having fluid retention, or  leg edema Qty: 30 tablet, Refills: 0    LANOXIN 62.5 MCG TABS TAKE ONE TABLET BY MOUTH DAILY Qty: 30 tablet, Refills: 3  levothyroxine (SYNTHROID, LEVOTHROID) 75 MCG tablet Take 0.5 tablets (37.5 mcg total) by mouth daily before breakfast. Qty: 45 tablet, Refills: 1    methocarbamol (ROBAXIN) 500 MG tablet Take 500 mg by mouth 2 (two) times daily.     Metoprolol Tartrate 75 MG TABS Take 75 mg by mouth 3 (three) times daily. Qty: 135 tablet, Refills: 3    mirtazapine (REMERON) 15 MG tablet     oxyCODONE-acetaminophen (PERCOCET) 7.5-325 MG tablet Take 1 tablet by mouth every 4 (four) hours as needed for severe pain.    psyllium (HYDROCIL/METAMUCIL) 95 % PACK Take 1 packet by mouth every other day.    rosuvastatin (CRESTOR) 5 MG tablet TAKE 1 TABLET BY MOUTH IN THE EVENING Qty: 30  tablet, Refills: 6    !! feeding supplement (BOOST / RESOURCE BREEZE) LIQD Take 1 Container by mouth 3 (three) times daily between meals. Qty: 90 Container, Refills: 0    !! feeding supplement (ENSURE CLINICAL STRENGTH) LIQD Take 237 mLs by mouth 3 (three) times daily with meals.    senna-docusate (SENOKOT-S) 8.6-50 MG tablet Take 1 tablet by mouth at bedtime as needed for mild constipation. Qty: 30 tablet, Refills: 0     !! - Potential duplicate medications found. Please discuss with provider.     No Known Allergies Follow-up Information    [name], MD. Schedule an appointment as soon as possible for a visit in 10 day(s).   Specialty:  Internal Medicine Contact information: [address] [place] [address] [phone]           The results of significant diagnostics from this hospitalization (including imaging, microbiology, ancillary and laboratory) are listed below for reference.    Significant Diagnostic Studies: Ct Abdomen Pelvis Wo Contrast  Result Date: 04/29/2016 CLINICAL DATA:  Altered mental status, weakness, bilateral pleural effusions, elevated liver function tests. EXAM: CT CHEST, ABDOMEN AND PELVIS WITHOUT CONTRAST TECHNIQUE: Multidetector CT imaging of the chest, abdomen and pelvis was performed following the standard protocol without IV contrast. COMPARISON:  Chest radiograph 04/29/2016 FINDINGS: CT CHEST FINDINGS Cardiovascular: Enlarged heart. No evidence of pericardial effusion. Biventricular pacemaker in place. Heavy calcific atherosclerotic disease of the coronary arteries and thoracic aorta. Post CABG. Mediastinum/Nodes: No enlarged mediastinal, hilar, or axillary lymph nodes. Thyroid gland, trachea, and esophagus demonstrate no significant findings. Lungs/Pleura: Advanced centrilobular emphysema. Mild hyperinflation. Atelectatic changes in the dependent portions of all lobes of the lungs. More confluent airspace opacity in the medial portion of the  right middle lobe. Mild interstitial pulmonary edema. Small to moderate in size bilateral pleural effusions. Musculoskeletal: No chest wall mass or suspicious bone lesions identified. CT ABDOMEN PELVIS FINDINGS Hepatobiliary: No focal liver abnormality is seen. No gallstones, gallbladder wall thickening, or biliary dilatation. Pancreas: Unremarkable. No pancreatic ductal dilatation or surrounding inflammatory changes. Spleen: Normal in size without focal abnormality. Adrenals/Urinary Tract: Adrenal glands are unremarkable. Kidneys are without renal calculi, or hydronephrosis. Hypoattenuated 1.6 cm lesion within the superior pole of the left kidney measures water density and therefore likely represents a cyst. Bladder is unremarkable. Stomach/Bowel: Stomach is within normal limits. No evidence of bowel wall thickening, distention, or inflammatory changes. Vascular/Lymphatic: Aortic atherosclerosis. No enlarged abdominal or pelvic lymph nodes. Reproductive: Status post hysterectomy. No adnexal masses. Other: Small amount of free fluid in the pelvis. Generalized mesenteric stranding in the abdomen and pelvis. Musculoskeletal: No suspicious osseous lesions. S shaped spinal scoliosis with associated moderate in severity osteoarthritic changes. IMPRESSION: Enlarged heart. Advanced calcific atherosclerotic disease of the  coronary arteries and aorta. Advanced centrilobular emphysema and hyperinflation, consistent with COPD. Superimposed mild interstitial pulmonary edema. Moderate in size bilateral simple appearing pleural effusions. Widespread atelectatic changes in the dependent portions of the lungs. More confluent airspace opacity in the right middle lobe may represent round atelectasis/airspace consolidation or less likely pulmonary mass. Attention on follow-up is recommended. Small amount of free pelvic fluid and generalized mesenteric stranding of the abdomen. No significant abnormalities within the solid abdominal  organs noted on this noncontrasted CT. Electronically Signed   By: Ted Mcalpineobrinka  Dimitrova M.D.   On: 04/29/2016 15:43   Dg Chest 2 View  Result Date: 04/29/2016 CLINICAL DATA:  Weakness and confusion for 3 days. Cough and chest congestion. COPD. EXAM: CHEST  2 VIEW COMPARISON:  11/24/2015 FINDINGS: Moderate enlargement of the cardiopericardial silhouette with bilateral pleural effusions and passive atelectasis. The pleural effusions are mildly increased in size from 11/24/2015. Atherosclerotic calcification of the aortic arch. Prior CABG. Single lead pacer noted. Cervical plate and screw fixator. Suspected underlying emphysema.  No overt edema currently. Mild chronic mid thoracic wedging. Sternal manubrial deformity, chronic. IMPRESSION: 1. Small to moderate-sized bilateral pleural effusions, increased from prior, with associated passive atelectasis. 2. Moderate enlargement of the cardiopericardial silhouette, without overt edema. 3. Prior CABG. 4.  Atherosclerotic calcification of the aortic arch. 5. Emphysema. Electronically Signed   By: Gaylyn RongWalter  Liebkemann M.D.   On: 04/29/2016 11:45   Ct Head Wo Contrast  Result Date: 04/29/2016 CLINICAL DATA:  Altered mental status EXAM: CT HEAD WITHOUT CONTRAST TECHNIQUE: Contiguous axial images were obtained from the base of the skull through the vertex without intravenous contrast. COMPARISON:  04/05/2015 FINDINGS: Brain: Old left frontal infarct. Mild chronic microvascular disease throughout the deep white matter. No acute intracranial abnormality. Specifically, no hemorrhage, hydrocephalus, mass lesion, acute infarction, or significant intracranial injury. Vascular: No hyperdense vessel or unexpected calcification. Skull: No acute calvarial abnormality. Sinuses/Orbits: Visualized paranasal sinuses and mastoids clear. Orbital soft tissues unremarkable. Other: None IMPRESSION: Old left frontal infarct. Mild chronic small vessel disease. No acute intracranial abnormality.  Electronically Signed   By: Charlett NoseKevin  Dover M.D.   On: 04/29/2016 12:01   Ct Chest Wo Contrast  Result Date: 04/29/2016 CLINICAL DATA:  Altered mental status, weakness, bilateral pleural effusions, elevated liver function tests. EXAM: CT CHEST, ABDOMEN AND PELVIS WITHOUT CONTRAST TECHNIQUE: Multidetector CT imaging of the chest, abdomen and pelvis was performed following the standard protocol without IV contrast. COMPARISON:  Chest radiograph 04/29/2016 FINDINGS: CT CHEST FINDINGS Cardiovascular: Enlarged heart. No evidence of pericardial effusion. Biventricular pacemaker in place. Heavy calcific atherosclerotic disease of the coronary arteries and thoracic aorta. Post CABG. Mediastinum/Nodes: No enlarged mediastinal, hilar, or axillary lymph nodes. Thyroid gland, trachea, and esophagus demonstrate no significant findings. Lungs/Pleura: Advanced centrilobular emphysema. Mild hyperinflation. Atelectatic changes in the dependent portions of all lobes of the lungs. More confluent airspace opacity in the medial portion of the right middle lobe. Mild interstitial pulmonary edema. Small to moderate in size bilateral pleural effusions. Musculoskeletal: No chest wall mass or suspicious bone lesions identified. CT ABDOMEN PELVIS FINDINGS Hepatobiliary: No focal liver abnormality is seen. No gallstones, gallbladder wall thickening, or biliary dilatation. Pancreas: Unremarkable. No pancreatic ductal dilatation or surrounding inflammatory changes. Spleen: Normal in size without focal abnormality. Adrenals/Urinary Tract: Adrenal glands are unremarkable. Kidneys are without renal calculi, or hydronephrosis. Hypoattenuated 1.6 cm lesion within the superior pole of the left kidney measures water density and therefore likely represents a cyst. Bladder is unremarkable. Stomach/Bowel: Stomach  is within normal limits. No evidence of bowel wall thickening, distention, or inflammatory changes. Vascular/Lymphatic: Aortic atherosclerosis.  No enlarged abdominal or pelvic lymph nodes. Reproductive: Status post hysterectomy. No adnexal masses. Other: Small amount of free fluid in the pelvis. Generalized mesenteric stranding in the abdomen and pelvis. Musculoskeletal: No suspicious osseous lesions. S shaped spinal scoliosis with associated moderate in severity osteoarthritic changes. IMPRESSION: Enlarged heart. Advanced calcific atherosclerotic disease of the coronary arteries and aorta. Advanced centrilobular emphysema and hyperinflation, consistent with COPD. Superimposed mild interstitial pulmonary edema. Moderate in size bilateral simple appearing pleural effusions. Widespread atelectatic changes in the dependent portions of the lungs. More confluent airspace opacity in the right middle lobe may represent round atelectasis/airspace consolidation or less likely pulmonary mass. Attention on follow-up is recommended. Small amount of free pelvic fluid and generalized mesenteric stranding of the abdomen. No significant abnormalities within the solid abdominal organs noted on this noncontrasted CT. Electronically Signed   By: Ted Mcalpine M.D.   On: 04/29/2016 15:43    Microbiology: No results found for this or any previous visit (from the past 240 hour(s)).   Labs: Basic Metabolic Panel:  Recent Labs Lab 04/29/16 1120 04/30/16 0549 05/01/16 0525  NA 138 138 138  K 4.1 3.7 3.8  CL 102 101 100*  CO2 30 31 32  GLUCOSE 159* 105* 95  BUN 39* 22* 16  CREATININE 1.23* 0.81 0.76  CALCIUM 7.9* 7.7* 8.0*   Liver Function Tests:  Recent Labs Lab 04/29/16 1120 04/30/16 0549 05/01/16 0525  AST 557* 305* 181*  ALT 602* 394* 320*  ALKPHOS 140* 125 125  BILITOT 2.5* 2.1* 1.6*  PROT 5.7* 5.4* 5.4*  ALBUMIN 3.8 3.2* 3.2*    Recent Labs Lab 04/29/16 1223  AMMONIA 38*   CBC:  Recent Labs Lab 04/29/16 1120 04/30/16 0549 05/01/16 0525  WBC 6.5 6.8 4.9  NEUTROABS 5.4  --   --   HGB 8.8* 7.8* 8.5*  HCT 30.6* 27.7* 30.0*   MCV 80.7 80.5 80.6  PLT 257 196 210   Cardiac Enzymes:  Recent Labs Lab 04/29/16 1736  TROPONINI <0.03   BNP: BNP (last 3 results)  Recent Labs  11/16/15 2350 04/29/16 1120  BNP 460.0* 1,004.0*    CBG:  Recent Labs Lab 04/29/16 1026  GLUCAP 169*    Signed:  Vassie Loll MD.  [facility] 05/01/2016, 11:52 AM

## 2016-05-04 ENCOUNTER — Inpatient Hospital Stay (HOSPITAL_COMMUNITY)
Admission: EM | Admit: 2016-05-04 | Discharge: 2016-06-01 | DRG: 871 | Disposition: E | Payer: Medicare HMO | Attending: Internal Medicine | Admitting: Internal Medicine

## 2016-05-04 ENCOUNTER — Encounter (HOSPITAL_COMMUNITY): Payer: Self-pay | Admitting: Emergency Medicine

## 2016-05-04 ENCOUNTER — Emergency Department (HOSPITAL_COMMUNITY): Payer: Medicare HMO

## 2016-05-04 DIAGNOSIS — Z66 Do not resuscitate: Secondary | ICD-10-CM | POA: Diagnosis present

## 2016-05-04 DIAGNOSIS — R680 Hypothermia, not associated with low environmental temperature: Secondary | ICD-10-CM | POA: Diagnosis present

## 2016-05-04 DIAGNOSIS — R627 Adult failure to thrive: Secondary | ICD-10-CM | POA: Diagnosis present

## 2016-05-04 DIAGNOSIS — Z681 Body mass index (BMI) 19 or less, adult: Secondary | ICD-10-CM

## 2016-05-04 DIAGNOSIS — I472 Ventricular tachycardia: Secondary | ICD-10-CM | POA: Diagnosis present

## 2016-05-04 DIAGNOSIS — R7989 Other specified abnormal findings of blood chemistry: Secondary | ICD-10-CM | POA: Diagnosis present

## 2016-05-04 DIAGNOSIS — I5032 Chronic diastolic (congestive) heart failure: Secondary | ICD-10-CM | POA: Diagnosis present

## 2016-05-04 DIAGNOSIS — J449 Chronic obstructive pulmonary disease, unspecified: Secondary | ICD-10-CM | POA: Diagnosis not present

## 2016-05-04 DIAGNOSIS — I509 Heart failure, unspecified: Secondary | ICD-10-CM | POA: Diagnosis present

## 2016-05-04 DIAGNOSIS — I2510 Atherosclerotic heart disease of native coronary artery without angina pectoris: Secondary | ICD-10-CM | POA: Diagnosis present

## 2016-05-04 DIAGNOSIS — I2781 Cor pulmonale (chronic): Secondary | ICD-10-CM | POA: Diagnosis present

## 2016-05-04 DIAGNOSIS — D696 Thrombocytopenia, unspecified: Secondary | ICD-10-CM | POA: Diagnosis present

## 2016-05-04 DIAGNOSIS — I255 Ischemic cardiomyopathy: Secondary | ICD-10-CM | POA: Diagnosis present

## 2016-05-04 DIAGNOSIS — Z951 Presence of aortocoronary bypass graft: Secondary | ICD-10-CM

## 2016-05-04 DIAGNOSIS — D689 Coagulation defect, unspecified: Secondary | ICD-10-CM | POA: Diagnosis present

## 2016-05-04 DIAGNOSIS — E875 Hyperkalemia: Secondary | ICD-10-CM | POA: Diagnosis present

## 2016-05-04 DIAGNOSIS — J189 Pneumonia, unspecified organism: Secondary | ICD-10-CM | POA: Diagnosis present

## 2016-05-04 DIAGNOSIS — I739 Peripheral vascular disease, unspecified: Secondary | ICD-10-CM | POA: Diagnosis present

## 2016-05-04 DIAGNOSIS — R791 Abnormal coagulation profile: Secondary | ICD-10-CM

## 2016-05-04 DIAGNOSIS — Z823 Family history of stroke: Secondary | ICD-10-CM

## 2016-05-04 DIAGNOSIS — D649 Anemia, unspecified: Secondary | ICD-10-CM | POA: Diagnosis present

## 2016-05-04 DIAGNOSIS — J9600 Acute respiratory failure, unspecified whether with hypoxia or hypercapnia: Secondary | ICD-10-CM

## 2016-05-04 DIAGNOSIS — Z87891 Personal history of nicotine dependence: Secondary | ICD-10-CM

## 2016-05-04 DIAGNOSIS — E039 Hypothyroidism, unspecified: Secondary | ICD-10-CM | POA: Diagnosis present

## 2016-05-04 DIAGNOSIS — Z8673 Personal history of transient ischemic attack (TIA), and cerebral infarction without residual deficits: Secondary | ICD-10-CM

## 2016-05-04 DIAGNOSIS — G9341 Metabolic encephalopathy: Secondary | ICD-10-CM | POA: Diagnosis present

## 2016-05-04 DIAGNOSIS — A419 Sepsis, unspecified organism: Secondary | ICD-10-CM | POA: Diagnosis present

## 2016-05-04 DIAGNOSIS — Z79899 Other long term (current) drug therapy: Secondary | ICD-10-CM

## 2016-05-04 DIAGNOSIS — Z90710 Acquired absence of both cervix and uterus: Secondary | ICD-10-CM

## 2016-05-04 DIAGNOSIS — N179 Acute kidney failure, unspecified: Secondary | ICD-10-CM | POA: Diagnosis present

## 2016-05-04 DIAGNOSIS — J9621 Acute and chronic respiratory failure with hypoxia: Secondary | ICD-10-CM | POA: Diagnosis present

## 2016-05-04 DIAGNOSIS — Z981 Arthrodesis status: Secondary | ICD-10-CM | POA: Diagnosis not present

## 2016-05-04 DIAGNOSIS — Z7189 Other specified counseling: Secondary | ICD-10-CM | POA: Diagnosis not present

## 2016-05-04 DIAGNOSIS — E43 Unspecified severe protein-calorie malnutrition: Secondary | ICD-10-CM | POA: Diagnosis present

## 2016-05-04 DIAGNOSIS — N183 Chronic kidney disease, stage 3 (moderate): Secondary | ICD-10-CM | POA: Diagnosis present

## 2016-05-04 DIAGNOSIS — J9601 Acute respiratory failure with hypoxia: Secondary | ICD-10-CM

## 2016-05-04 DIAGNOSIS — J90 Pleural effusion, not elsewhere classified: Secondary | ICD-10-CM

## 2016-05-04 DIAGNOSIS — K7689 Other specified diseases of liver: Secondary | ICD-10-CM | POA: Diagnosis present

## 2016-05-04 DIAGNOSIS — I130 Hypertensive heart and chronic kidney disease with heart failure and stage 1 through stage 4 chronic kidney disease, or unspecified chronic kidney disease: Secondary | ICD-10-CM | POA: Diagnosis present

## 2016-05-04 DIAGNOSIS — Z8701 Personal history of pneumonia (recurrent): Secondary | ICD-10-CM

## 2016-05-04 DIAGNOSIS — I4891 Unspecified atrial fibrillation: Secondary | ICD-10-CM | POA: Diagnosis not present

## 2016-05-04 DIAGNOSIS — Z515 Encounter for palliative care: Secondary | ICD-10-CM | POA: Diagnosis not present

## 2016-05-04 DIAGNOSIS — Z7901 Long term (current) use of anticoagulants: Secondary | ICD-10-CM

## 2016-05-04 DIAGNOSIS — J440 Chronic obstructive pulmonary disease with acute lower respiratory infection: Secondary | ICD-10-CM | POA: Diagnosis present

## 2016-05-04 DIAGNOSIS — Y95 Nosocomial condition: Secondary | ICD-10-CM | POA: Diagnosis present

## 2016-05-04 DIAGNOSIS — R6521 Severe sepsis with septic shock: Secondary | ICD-10-CM | POA: Diagnosis present

## 2016-05-04 DIAGNOSIS — R739 Hyperglycemia, unspecified: Secondary | ICD-10-CM | POA: Diagnosis present

## 2016-05-04 DIAGNOSIS — R579 Shock, unspecified: Secondary | ICD-10-CM | POA: Diagnosis present

## 2016-05-04 DIAGNOSIS — I481 Persistent atrial fibrillation: Secondary | ICD-10-CM | POA: Diagnosis present

## 2016-05-04 DIAGNOSIS — I2729 Other secondary pulmonary hypertension: Secondary | ICD-10-CM | POA: Diagnosis present

## 2016-05-04 DIAGNOSIS — Z8249 Family history of ischemic heart disease and other diseases of the circulatory system: Secondary | ICD-10-CM

## 2016-05-04 DIAGNOSIS — E876 Hypokalemia: Secondary | ICD-10-CM | POA: Diagnosis present

## 2016-05-04 LAB — URINALYSIS, ROUTINE W REFLEX MICROSCOPIC
Bilirubin Urine: NEGATIVE
GLUCOSE, UA: NEGATIVE mg/dL
Hgb urine dipstick: NEGATIVE
KETONES UR: 5 mg/dL — AB
Leukocytes, UA: NEGATIVE
NITRITE: NEGATIVE
PH: 5 (ref 5.0–8.0)
PROTEIN: 100 mg/dL — AB
Specific Gravity, Urine: 1.018 (ref 1.005–1.030)

## 2016-05-04 LAB — CBC WITH DIFFERENTIAL/PLATELET
BASOS PCT: 0 %
Basophils Absolute: 0 10*3/uL (ref 0.0–0.1)
EOS ABS: 0 10*3/uL (ref 0.0–0.7)
Eosinophils Relative: 0 %
HEMATOCRIT: 27.3 % — AB (ref 36.0–46.0)
HEMOGLOBIN: 7.5 g/dL — AB (ref 12.0–15.0)
Lymphocytes Relative: 5 %
Lymphs Abs: 0.5 10*3/uL — ABNORMAL LOW (ref 0.7–4.0)
MCH: 22.7 pg — ABNORMAL LOW (ref 26.0–34.0)
MCHC: 27.5 g/dL — ABNORMAL LOW (ref 30.0–36.0)
MCV: 82.5 fL (ref 78.0–100.0)
MONOS PCT: 7 %
Monocytes Absolute: 0.7 10*3/uL (ref 0.1–1.0)
NEUTROS ABS: 8.8 10*3/uL — AB (ref 1.7–7.7)
NEUTROS PCT: 88 %
Platelets: 300 10*3/uL (ref 150–400)
RBC: 3.31 MIL/uL — AB (ref 3.87–5.11)
RDW: 19.5 % — ABNORMAL HIGH (ref 11.5–15.5)
WBC: 10 10*3/uL (ref 4.0–10.5)

## 2016-05-04 LAB — BRAIN NATRIURETIC PEPTIDE
B NATRIURETIC PEPTIDE 5: 1585.1 pg/mL — AB (ref 0.0–100.0)
B Natriuretic Peptide: 1981 pg/mL — ABNORMAL HIGH (ref 0.0–100.0)

## 2016-05-04 LAB — CBC
HCT: 25.1 % — ABNORMAL LOW (ref 36.0–46.0)
Hemoglobin: 6.8 g/dL — CL (ref 12.0–15.0)
MCH: 22.3 pg — ABNORMAL LOW (ref 26.0–34.0)
MCHC: 27.1 g/dL — ABNORMAL LOW (ref 30.0–36.0)
MCV: 82.3 fL (ref 78.0–100.0)
Platelets: 265 K/uL (ref 150–400)
RBC: 3.05 MIL/uL — ABNORMAL LOW (ref 3.87–5.11)
RDW: 19.6 % — ABNORMAL HIGH (ref 11.5–15.5)
WBC: 10.5 K/uL (ref 4.0–10.5)

## 2016-05-04 LAB — BLOOD GAS, VENOUS
ACID-BASE DEFICIT: 7.8 mmol/L — AB (ref 0.0–2.0)
BICARBONATE: 21.6 mmol/L (ref 20.0–28.0)
FIO2: 100
O2 Saturation: 47.5 %
PATIENT TEMPERATURE: 93.4
pCO2, Ven: 65.2 mmHg — ABNORMAL HIGH (ref 44.0–60.0)
pH, Ven: 7.123 — CL (ref 7.250–7.430)
pO2, Ven: 29.3 mmHg — CL (ref 32.0–45.0)

## 2016-05-04 LAB — COMPREHENSIVE METABOLIC PANEL
ALK PHOS: 120 U/L (ref 38–126)
ALT: 1464 U/L — AB (ref 14–54)
ANION GAP: 12 (ref 5–15)
AST: 1212 U/L — ABNORMAL HIGH (ref 15–41)
Albumin: 3.2 g/dL — ABNORMAL LOW (ref 3.5–5.0)
BUN: 46 mg/dL — ABNORMAL HIGH (ref 6–20)
CALCIUM: 7.2 mg/dL — AB (ref 8.9–10.3)
CHLORIDE: 96 mmol/L — AB (ref 101–111)
CO2: 25 mmol/L (ref 22–32)
CREATININE: 1.94 mg/dL — AB (ref 0.44–1.00)
GFR calc non Af Amer: 24 mL/min — ABNORMAL LOW (ref >60)
GFR, EST AFRICAN AMERICAN: 28 mL/min — AB (ref >60)
Glucose, Bld: 145 mg/dL — ABNORMAL HIGH (ref 65–99)
Potassium: 5.6 mmol/L — ABNORMAL HIGH (ref 3.5–5.1)
SODIUM: 133 mmol/L — AB (ref 135–145)
Total Bilirubin: 3.5 mg/dL — ABNORMAL HIGH (ref 0.3–1.2)
Total Protein: 5.4 g/dL — ABNORMAL LOW (ref 6.5–8.1)

## 2016-05-04 LAB — PROCALCITONIN: PROCALCITONIN: 1.31 ng/mL

## 2016-05-04 LAB — TROPONIN I: TROPONIN I: 0.04 ng/mL — AB (ref <0.03)

## 2016-05-04 LAB — PREPARE RBC (CROSSMATCH)

## 2016-05-04 LAB — I-STAT CG4 LACTIC ACID, ED: Lactic Acid, Venous: 4.98 mmol/L (ref 0.5–1.9)

## 2016-05-04 LAB — CORTISOL: Cortisol, Plasma: 71.8 ug/dL

## 2016-05-04 LAB — PROTIME-INR
INR: >10
Prothrombin Time: >90 seconds — ABNORMAL HIGH (ref 11.4–15.2)

## 2016-05-04 LAB — ABO/RH: ABO/RH(D): A NEG

## 2016-05-04 LAB — LIPASE, BLOOD: Lipase: 114 U/L — ABNORMAL HIGH (ref 11–51)

## 2016-05-04 LAB — LACTIC ACID, PLASMA
Lactic Acid, Venous: 3.3 mmol/L (ref 0.5–1.9)
Lactic Acid, Venous: 2.3 mmol/L (ref 0.5–1.9)

## 2016-05-04 LAB — LACTATE DEHYDROGENASE: LDH: 583 U/L — ABNORMAL HIGH (ref 98–192)

## 2016-05-04 LAB — POC OCCULT BLOOD, ED: Fecal Occult Bld: NEGATIVE

## 2016-05-04 LAB — CBG MONITORING, ED: Glucose-Capillary: 148 mg/dL — ABNORMAL HIGH (ref 65–99)

## 2016-05-04 LAB — APTT: APTT: 58 s — AB (ref 24–36)

## 2016-05-04 LAB — AMYLASE: AMYLASE: 156 U/L — AB (ref 28–100)

## 2016-05-04 LAB — INFLUENZA PANEL BY PCR (TYPE A & B)
Influenza A By PCR: NEGATIVE
Influenza B By PCR: NEGATIVE

## 2016-05-04 LAB — TSH: TSH: 2.668 u[IU]/mL (ref 0.350–4.500)

## 2016-05-04 MED ORDER — SODIUM CHLORIDE 0.9 % IV SOLN: 250.0000 mL | INTRAVENOUS | Status: DC | PRN

## 2016-05-04 MED ORDER — METRONIDAZOLE IVPB CUSTOM: 250.0000 mg | Freq: Three times a day (TID) | INTRAVENOUS | Status: DC

## 2016-05-04 MED ORDER — INSULIN ASPART 100 UNIT/ML ~~LOC~~ SOLN
0.0000 [IU] | SUBCUTANEOUS | Status: DC
  Administered 2016-05-04 – 2016-05-05 (×4): 1 [IU] via SUBCUTANEOUS
  Administered 2016-05-07 – 2016-05-08 (×2): 2 [IU] via SUBCUTANEOUS
  Administered 2016-05-08 (×2): 1 [IU] via SUBCUTANEOUS
  Administered 2016-05-08: 2 [IU] via SUBCUTANEOUS
  Administered 2016-05-09 – 2016-05-10 (×5): 1 [IU] via SUBCUTANEOUS

## 2016-05-04 MED ORDER — SODIUM CHLORIDE 0.9 % IV SOLN
Freq: Once | INTRAVENOUS | Status: AC
  Administered 2016-05-04: 22:00:00 via INTRAVENOUS

## 2016-05-04 MED ORDER — SODIUM CHLORIDE 0.9 % IV BOLUS (SEPSIS)
1000.0000 mL | Freq: Once | INTRAVENOUS | Status: AC
Start: 2016-05-04 — End: 2016-05-04
  Administered 2016-05-04: 1000 mL via INTRAVENOUS

## 2016-05-04 MED ORDER — SODIUM CHLORIDE 0.9 % IV SOLN
500.0000 mg | Freq: Two times a day (BID) | INTRAVENOUS | Status: DC
  Administered 2016-05-04 – 2016-05-08 (×8): 500 mg via INTRAVENOUS
  Filled 2016-05-04 (×8): qty 0.5

## 2016-05-04 MED ORDER — VANCOMYCIN HCL 500 MG IV SOLR: 500.0000 mg | INTRAVENOUS | Status: DC

## 2016-05-04 MED ORDER — CEFEPIME HCL 2 G IJ SOLR
2.0000 g | Freq: Once | INTRAMUSCULAR | Status: AC
  Administered 2016-05-04: 2 g via INTRAVENOUS
  Filled 2016-05-04: qty 2

## 2016-05-04 MED ORDER — SODIUM CHLORIDE 0.9 % IV SOLN: 500.0000 mg | INTRAVENOUS | Status: DC

## 2016-05-04 MED ORDER — VITAMIN K1 10 MG/ML IJ SOLN
10.0000 mg | Freq: Once | INTRAVENOUS | Status: AC
  Administered 2016-05-04: 10 mg via INTRAVENOUS
  Filled 2016-05-04: qty 1

## 2016-05-04 MED ORDER — NOREPINEPHRINE BITARTRATE 1 MG/ML IV SOLN
0.0000 ug/min | Freq: Once | INTRAVENOUS | Status: AC
  Administered 2016-05-04: 2 ug/min via INTRAVENOUS
  Filled 2016-05-04: qty 4

## 2016-05-04 MED ORDER — FAMOTIDINE IN NACL 20-0.9 MG/50ML-% IV SOLN
20.0000 mg | Freq: Two times a day (BID) | INTRAVENOUS | Status: DC
  Administered 2016-05-04: 20 mg via INTRAVENOUS
  Filled 2016-05-04: qty 50

## 2016-05-04 MED ORDER — DEXTROSE 5 % IV SOLN: 1.0000 g | INTRAVENOUS | Status: DC

## 2016-05-04 MED ORDER — VANCOMYCIN HCL 500 MG IV SOLR
500.0000 mg | INTRAVENOUS | Status: AC
  Administered 2016-05-04: 500 mg via INTRAVENOUS
  Filled 2016-05-04: qty 500

## 2016-05-04 MED ORDER — ASPIRIN 81 MG PO CHEW: 324.0000 mg | CHEWABLE_TABLET | ORAL | Status: DC

## 2016-05-04 MED ORDER — HYDROCORTISONE NA SUCCINATE PF 100 MG IJ SOLR
50.0000 mg | Freq: Four times a day (QID) | INTRAMUSCULAR | Status: DC
  Administered 2016-05-04 – 2016-05-05 (×3): 50 mg via INTRAVENOUS
  Filled 2016-05-04 (×3): qty 2

## 2016-05-04 MED ORDER — NOREPINEPHRINE BITARTRATE 1 MG/ML IV SOLN
0.0000 ug/min | Freq: Once | INTRAVENOUS | Status: AC
  Administered 2016-05-04: 6 ug/min via INTRAVENOUS

## 2016-05-04 MED ORDER — SODIUM CHLORIDE 0.9 % IV BOLUS (SEPSIS)
500.0000 mL | Freq: Once | INTRAVENOUS | Status: AC
  Administered 2016-05-04: 500 mL via INTRAVENOUS

## 2016-05-04 MED ORDER — ASPIRIN 300 MG RE SUPP: 300.0000 mg | RECTAL | Status: DC

## 2016-05-04 MED ORDER — SODIUM CHLORIDE 0.9 % IV SOLN
INTRAVENOUS | Status: DC
  Administered 2016-05-04: 17:00:00 via INTRAVENOUS

## 2016-05-04 MED ORDER — SODIUM CHLORIDE 0.9 % IV SOLN
Freq: Once | INTRAVENOUS | Status: AC
  Administered 2016-05-04: 17:00:00 via INTRAVENOUS

## 2016-05-04 NOTE — H&P (Signed)
PULMONARY / CRITICAL CARE MEDICINE   Name: [name] MRN: [id number] DOB: [birth year]    ADMISSION DATE:  05-22-16  REFERRING MD:  EDP   CHIEF COMPLAINT:  Sepsis   HISTORY OF PRESENT ILLNESS:   76yo female with multiple medical problems including CAD s/p CABG 2009, CKD, previous stroke, AFib on eliquis, Pulmonary HTN with recent admission 1/27-1/29 for sepsis/CAP, elevated LFT's, n/v/d.  She was d/c on augmentin and supplemental O2.  She returned 2/1 with weakness, AMS.   In ER had significant hypotension, lactic acid 5, poor mental status, coagulopathy with INR>10, significantly elevated LFT's, AKI.  PCCM consulted for ICU admit for presumed septic shock.   PAST MEDICAL HISTORY :  She  has a past medical history of Anxiety; CAD (coronary artery disease), native coronary artery (2009); Cervical neck pain with evidence of disc disease; Chronic low back pain; CKD (chronic kidney disease); COPD (chronic obstructive pulmonary disease) (HCC); Dyslipidemia, goal LDL below 70; Essential hypertension; H/O ischemic left MCA stroke (11/18/2014); H/O: pneumonia; Hypothyroidism; Ischemic cardiomyopathy (2012); Osteoarthritis of back; PAD (peripheral artery disease) (HCC) (2007); Persistent atrial fibrillation (HCC) (04/05/2012); Pulmonary hypertension; S/P CABG x 2 (2009); Stroke [facility]); and TIA (transient ischemic attack) (04/05/2015).  PAST SURGICAL HISTORY: She  has a past surgical history that includes Iliac artery stent (Left, 04/06/2005); Coronary artery bypass graft (2009); MAZE (2009); Cardioversion (04/05/2012); transthoracic echocardiogram (Jan 2012); NM MYOVIEW LTD (May 2013); Abdominal hysterectomy; Cardiac catheterization (07/30/2007); NM CARDIOLITE LTD (12/2013); Anterior cervical decomp/discectomy fusion (N/A, 01/23/2014); Colonoscopy with propofol (N/A, 11/12/2014); transthoracic echocardiogram (Jan 2017); and Cardiac catheterization (N/A, 11/23/2015).  No Known Allergies  No current  facility-administered medications on file prior to encounter.    Current Outpatient Prescriptions on File Prior to Encounter  Medication Sig  . amoxicillin-clavulanate (AUGMENTIN) 875-125 MG tablet Take 1 tablet by mouth 2 (two) times daily.  Marland Kitchen apixaban (ELIQUIS) 5 MG TABS tablet Take 1 tablet (5 mg total) by mouth 2 (two) times daily.  . Cholecalciferol (VITAMIN D3) 2000 units TABS Take 1 tablet by mouth daily.  Marland Kitchen diltiazem (CARDIZEM CD) 180 MG 24 hr capsule Take 1 capsule (180 mg total) by mouth daily.  . feeding supplement (BOOST / RESOURCE BREEZE) LIQD Take 1 Container by mouth 3 (three) times daily between meals.  . ferrous sulfate 325 (65 FE) MG tablet Take 1 tablet (325 mg total) by mouth daily with breakfast.  . furosemide (LASIX) 20 MG tablet Take 1 tablet (20 mg total) by mouth every other day. Take extra dose if having fluid retention, or  leg edema  . Ipratropium-Albuterol (COMBIVENT RESPIMAT) 20-100 MCG/ACT AERS respimat Inhale 1 puff into the lungs every 6 (six) hours as needed for wheezing or shortness of breath.  Marland Kitchen LANOXIN 62.5 MCG TABS TAKE ONE TABLET BY MOUTH DAILY  . levothyroxine (SYNTHROID, LEVOTHROID) 75 MCG tablet Take 0.5 tablets (37.5 mcg total) by mouth daily before breakfast.  . Metoprolol Tartrate 75 MG TABS Take 75 mg by mouth 3 (three) times daily.  Marland Kitchen oxyCODONE-acetaminophen (PERCOCET) 7.5-325 MG tablet Take 1 tablet by mouth every 4 (four) hours as needed for severe pain.  . potassium chloride (K-DUR) 10 MEQ tablet Take 1 tablet (10 mEq total) by mouth every other day.  . psyllium (HYDROCIL/METAMUCIL) 95 % PACK Take 1 packet by mouth every other day.  . rosuvastatin (CRESTOR) 5 MG tablet TAKE 1 TABLET BY MOUTH IN THE EVENING  . saccharomyces boulardii (FLORASTOR) 250 MG capsule Take 1 capsule (250 mg total) by  mouth 2 (two) times daily.  . feeding supplement (ENSURE CLINICAL STRENGTH) LIQD Take 237 mLs by mouth 3 (three) times daily with meals. (Patient not taking:  Reported on 04/29/2016)  . senna-docusate (SENOKOT-S) 8.6-50 MG tablet Take 1 tablet by mouth at bedtime as needed for mild constipation. (Patient not taking: Reported on 04/29/2016)    FAMILY HISTORY:  Her indicated that her mother is deceased. She indicated that her father is deceased. She indicated that her sister is alive. She indicated that her brother is alive. She indicated that her daughter is alive. She indicated that the status of her son is unknown.    SOCIAL HISTORY: She  reports that she quit smoking about 18 years ago. Her smoking use included Cigarettes. She smoked 0.50 packs per day. She has never used smokeless tobacco. She reports that she does not drink alcohol or use drugs.  REVIEW OF SYSTEMS:   Unable. As per HPI obtained from family and staff.   SUBJECTIVE:    VITAL SIGNS: BP (!) 92/53   Pulse 60   Temp (!) 93.4 F (34.1 C) (Rectal)   Resp 15   Ht 4\' 11"  (1.499 m)   Wt 33.6 kg (74 lb)   SpO2 (!) 89%   BMI 14.95 kg/m   HEMODYNAMICS:    VENTILATOR SETTINGS:    INTAKE / OUTPUT: No intake/output data recorded.  PHYSICAL EXAMINATION:   General:  Weak, slight agonal int resp Neuro: awakens and does follow slight commands, but lethargic, moves all ext HEENT:  jvd low to normal Cardiovascular: s 1 s 2 rrr paced int Lungs:  Reduced slight coarse anterior Abdomen:  Soft, no sig distention, BS wnl, no brusiing, no r/g Musculoskeletal:  cachcetic Skin:  No rash  LABS:  BMET  Recent Labs Lab 04/30/16 0549 05/01/16 0525 April 28, 2016 1455  NA 138 138 133*  K 3.7 3.8 5.6*  CL 101 100* 96*  CO2 31 32 25  BUN 22* 16 46*  CREATININE 0.81 0.76 1.94*  GLUCOSE 105* 95 145*    Electrolytes  Recent Labs Lab 04/30/16 0549 05/01/16 0525 April 28, 2016 1455  CALCIUM 7.7* 8.0* 7.2*    CBC  Recent Labs Lab 04/30/16 0549 05/01/16 0525 April 28, 2016 1455  WBC 6.8 4.9 10.0  HGB 7.8* 8.5* 7.5*  HCT 27.7* 30.0* 27.3*  PLT 196 210 300    Coag's  Recent  Labs Lab April 28, 2016 1455  APTT 58*  INR >10.00*    Sepsis Markers  Recent Labs Lab April 28, 2016 1451  LATICACIDVEN 4.98*    ABG No results for input(s): PHART, PCO2ART, PO2ART in the last 168 hours.  Liver Enzymes  Recent Labs Lab 04/30/16 0549 05/01/16 0525 April 28, 2016 1455  AST 305* 181* 1,212*  ALT 394* 320* 1,464*  ALKPHOS 125 125 120  BILITOT 2.1* 1.6* 3.5*  ALBUMIN 3.2* 3.2* 3.2*    Cardiac Enzymes  Recent Labs Lab 04/29/16 1736  TROPONINI <0.03    Glucose  Recent Labs Lab 04/29/16 1026  GLUCAP 169*    Imaging Dg Chest Port 1 View  Result Date: 05/23/2016 CLINICAL DATA:  Pneumonia diagnosed 5 days ago, persists symptoms. Shortness of breath and weakness. Former smoker. History of COPD and atrial fibrillation. EXAM: PORTABLE CHEST 1 VIEW COMPARISON:  Chest x-ray and chest CT scan of April 30, 2015 2018. FINDINGS: The lungs are reasonably well inflated. There are bilateral pleural effusions which have increased in volume. The cardiac silhouette is enlarged. The pulmonary vascularity is engorged the retrocardiac region on the left  demonstrates increased density. There is calcification in the wall of the aortic arch. The patient has undergone previous CABG. The ICD is in stable position. The bony thorax exhibits no acute abnormality. IMPRESSION: CHF with pulmonary interstitial edema and increased pleural effusion volume. Underlying COPD. Retrocardiac density is more conspicuous and may reflect atelectasis or pneumonia. Electronically Signed   By: David  [place] M.D.   On: 05/14/2016 15:07     STUDIES:  CT head 2/1>> neg acute  CT Chest/abd/pelvis 1/27>>> Advanced centrilobular emphysema and hyperinflation, consistent with COPD.  Superimposed mild interstitial pulmonary edema.  Moderate in size bilateral simple appearing pleural effusions. Widespread atelectatic changes in the dependent portions of the lungs. More confluent airspace opacity in the right middle  lobe may represent round atelectasis/airspace consolidation or less likely pulmonary mass. Attention on follow-up is recommended.  Small amount of free pelvic fluid and generalized mesenteric stranding of the abdomen.  CULTURES: BC x 2 2/1>>> Urine  2/1>>>  ANTIBIOTICS: vanc  2/1>>> Cefepime  2/1>>>2/1 Imipenem 2/1>>>  SIGNIFICANT EVENTS: 2/1- admit [facility], shock MODS, hcap? coagulapthy  LINES/TUBES: Not candidate  DISCUSSION: 76yo female with septic shock likely  ASSESSMENT / PLAN:  PULMONARY Acute on chronic hypoxic respiratory failure - multifactorial r/t PNA, COPD, bilat pleural effusion, AMS  P:   DNI F/u CXR in am, aware of effusions noted last CT 1/27, but currently needs intravascular volume VBG reviewed, no repeat necessary with DNI Not candidate NIMV , aspiration risk abx Flu PCR neg Allow pos balance   CARDIOVASCULAR Septic shock  Pulmonary HTN - most recent PA pressure (august 2017) in setting moderate TR  AFib  CAD s/p CABG 2009 P:  DNR Volume resuscitation , bolus further, did receive 2 liters, over 30 cc/kg Levophed as needed to keep MAP >55 -- max levophed or sys 85 given cuff and mucle mass, h/o 45% EF No CVL wished  Trend troponin  Hold anticoagulation for now Cortisol Lactic repeat ldh Stress roids  RENAL AKI on CKD - baseline Scr ~0.9 Hyperkalemia - mild  P:   Volume resuscitation as above  F/u chem  Trend lactate x 1 more saline  GASTROINTESTINAL Nausea/vomiting  Liver dysfunction - unclear etiology. No evidence obstructive etiology on CT. ?gilbert syndrome. Acute hepatitis panel negative.  Small amt pelvic free fluid - ? abd source contributory to sepsis  P:   F/u LFTs NPO  Correct coagulopathy - Vit K, FFP (unlikely retorper bleed, but would correct to some degree now) F/u INR post Ldh, ensure amy, lip  HEMATOLOGIC Coagulopathy - r/t liver dysfunction of unclear etiology. INR>10 (sepsis in setting eliquis (  arf) poor clearance) P:  Stat Vit K 10mg  IV  FFP x 4 units now  F/u CBC  SCD's   INFECTIOUS Sepsis - PNA +/- ?abd source  P:   Pan culture  Check pct Broad spectrum abx  - imi, vanc May need CT abdo  ENDOCRINE Hyperglycemia  R/o rel AI hypothermia P:   Monitor glucose on chem  Cortisol Stress roids tsh  NEUROLOGIC AMS - r/t sepsis, respiratory failure  P:   Supportive care  No repeat abg If distress - comfort care per d/w daughter Daughter aware high risk death next few hours  FAMILY  - Updates: extensive to daughter I have had extensive discussions with family daughter. We discussed patients current circumstances and organ failures. We also discussed patient's prior wishes under circumstances such as this. Family has decided to NOT perform resuscitation if  arrest but to continue current medical support for now. NO lines, max levo 10 mics, no HD, if distress - comfort care   - Inter-disciplinary family meet or Palliative Care meeting due by:  1 week  Ccm time 60 min   Mcarthur Rossetti. Tyson Alias, MD, FACP Pgr: [phone] [facility]

## 2016-05-04 NOTE — Progress Notes (Signed)
eLink Physician-Brief Progress Note Patient Name: Jeneen RinksRobina B Conkel DOB: 08/01/1940 MRN: [id number]   Date of Service  05/14/2016  HPI/Events of Note  Hb 6.8  eICU Interventions  1 U PRBC     Intervention Category Intermediate Interventions: Bleeding - evaluation and treatment with blood products  Jeily Guthridge V. 05/20/2016, 9:09 PM

## 2016-05-04 NOTE — ED Notes (Signed)
ED Provider at bedside. 

## 2016-05-04 NOTE — ED Notes (Signed)
RN aware of I-STAT values, RN will relay to EDP

## 2016-05-04 NOTE — Progress Notes (Signed)
Pt's daughter, grandson (who is her caregiver) and niece were bedside. Pt was awake, but family was having difficulty understanding some of her words. Family was attentive to pt and tearful of her rapid decline. CH offered emotional and spiritual spt and prayer as requested. Please page if additional support is needed. Chaplain Marjory LiesPamela Carrington Holder, M.Div. M.Div.   05/17/2016 1900  Clinical Encounter Type  Visited With Patient and family together

## 2016-05-04 NOTE — Progress Notes (Addendum)
   05/12/2016 0000  CM Assessment  In-house Referral NA  Discharge Planning Services CM Consult  [facility]  Choice offered to / list presented to  Adult Children  DME Arranged Oxygen  DME Agency [facility]  [facility]  Status of Service Completed, signed off    ED CM note CM consult for need to follow pt for possible d/c needs- pt gets oxygen [facility] Cm spoke with pt daughter to ask if choice of agency for possible d/c needs Daughter agree to have [facility] to follow pt for possible d/c needs

## 2016-05-04 NOTE — ED Triage Notes (Addendum)
Patient BIB daughter, states patient has had generalized weakness with "spells of confusion" since recent admission for pneumonia. Patient denies pain. Patient able to follow commands.

## 2016-05-04 NOTE — Progress Notes (Addendum)
Pharmacy Antibiotic Note  [name] is a 76 y.o. female admitted on 05/30/2016 with sepsis and HAP.  Pharmacy has been consulted for vancomycin/meropenem dosing.  Plan: 1) Vancomycin 500mg  IV q48h per current weight and CrCl  2) Meropenem 500mg  IV q12 for CrCl 10-25 ml/min     No data recorded.   Recent Labs Lab 04/29/16 1120 04/30/16 0549 05/01/16 0525 2017-01-18 1451  WBC 6.5 6.8 4.9  --   CREATININE 1.23* 0.81 0.76  --   LATICACIDVEN  --   --   --  4.98*    Estimated Creatinine Clearance: 31.7 mL/min (by C-G formula based on SCr of 0.76 mg/dL).    No Known Allergies   Thank you for allowing pharmacy to be a part of this patient's care.   Hessie KnowsJustin M Challen Spainhour, PharmD, BCPS Pager [phone] 05/20/2016 3:24 PM

## 2016-05-04 NOTE — ED Provider Notes (Signed)
[facility]-EMERGENCY DEPT Provider Note   CSN: [id number] Arrival date & time: 05/08/2016  1352     History   Chief Complaint Chief Complaint  Patient presents with  . Weakness    HPI [name] is a 76 y.o. female.  HPI  LEVEL 5 CAVEAT FOR ALTERED MENTAL STAUTS.  76 y.o.femalewith a history of chronic AFib on eliquis, CAD s/p CABG, chronic CHF, COPD, h/o CVA, CKD, Pulm HTN comes in to the ER with daughter with cc of weakness.  Pt is oriented to [place] and location. Daughter reports that pt normally is alert and active until last week when she needed admission for pneumonia. Pt was doing better when she was discharged 3 days ago, but started declining a day after discharge. Pt has not been eating much and not able to get up and move around or take care of herself. No recent falls. Pt is noted to have low BP and low O2. Pt has home O2 but doesn't typically need to use it. She was using it more after the discharge from the hospital. Also, there are no fevers. Pt is getting antibiotics at home.    Past Medical History:  Diagnosis Date  . Anxiety   . CAD (coronary artery disease), native coronary artery 2009   75% LAD, diffuse 75-90% RCA --> Referred for CABG + MAZE;  Cardiolite 12/2013: No ischemia or Infarction  . Cervical neck pain with evidence of disc disease    with need for surgery -- November 2015  . Chronic low back pain    scoliosis & lordosis  . CKD (chronic kidney disease)   . COPD (chronic obstructive pulmonary disease) (HCC)   . Dyslipidemia, goal LDL below 70    On Crestor, followed by PCP  . Essential hypertension   . H/O ischemic left MCA stroke 11/18/2014   MRI/MRA of head: Multifocal left MCA infarct.. Also possible left ACA territory infarct -- complete occlusion of left MCA distally at M2 level. Marked left ACA attenuation.  . H/O: pneumonia   . Hypothyroidism   . Ischemic cardiomyopathy 2012   EF ~40-45% by Echo  . Osteoarthritis of back    And neck,  hands,spine  . PAD (peripheral artery disease) (HCC) 2007   s/p L Ileac A stent ; most recent Dopplers July 2012: Less than 50% reduction bilaterally. ABI 0.96 on the right 0.88 on left;;;12/27/2011   -ABI right .87 and left ABI .78  ,LEFT CIA and EIA stent normall patency, left CFA,SFA,and popliteal 0-49%; rgt proximal SFA 50-69%,rgt CIA,EIA, and CFA 0-49%  . Persistent atrial fibrillation (HCC) 04/05/2012   Now permanent A. fib s/p MAZE -- recurrence, cardioversion-converted to sinus bradycardia --> Now persistent  . Pulmonary hypertension    Estimated PA pressure on Echo January 2017 = 59 mmHg with dilated IVC, severely dilated RA and moderate TR.  . S/P CABG x 2 2009   LIMA-LAD, SVG-RCA, with AV fistula ligation and Maze procedure  . Stroke (HCC)   . TIA (transient ischemic attack) 04/05/2015    Patient Active Problem List   Diagnosis Date Noted  . Septic shock (HCC) 05/23/2016  . Acute on chronic respiratory failure with hypoxia (HCC)   . Sepsis (HCC)   . Acute hypoxemic respiratory failure (HCC) 04/29/2016  . Pacemaker   . Tachy-brady syndrome (HCC)   . Severe protein-calorie malnutrition (HCC) 11/18/2015  . Hypokalemia 11/17/2015  . Microcytic anemia 11/17/2015  . Chronic diastolic CHF (congestive heart failure) (HCC) 11/17/2015  .  Chronic atrial fibrillation with RVR (HCC) 11/17/2015  . Pleural effusion 09/22/2015  . CAP (community acquired pneumonia) 09/18/2015  . Atrial fibrillation with RVR (HCC) 09/18/2015  . Chronic atrial fibrillation (HCC) 09/13/2015  . Chronic anticoagulation 09/13/2015  . Pulmonary hypertension   . Cough   . History of stroke   . Acute encephalopathy   . Acute cystitis without hematuria   . Aspiration pneumonia (HCC)   . TIA (transient ischemic attack) 04/05/2015  . CKD (chronic kidney disease) stage 3, GFR 30-59 ml/min 11/18/2014  . H/O ischemic left MCA stroke 11/18/2014  . Stroke with cerebral ischemia (HCC)   . Weight loss   . Spinal  stenosis in cervical region 01/23/2014  . Preoperative cardiovascular examination 01/13/2014  . Ischemic cardiomyopathy   . PAD (peripheral artery disease) (HCC)   . S/P CABG x 2   . Nausea and vomiting 11/29/2011  . 2 Vessel CAD - s/p CABG; LIMA-LAD, SVG-RCA 11/29/2011  . Permanent atrial fibrillation (HCC);  CHA2DS2Vasc is at least 7 on Eliquis.   11/29/2011  . Hyperlipidemia with target LDL less than 70 11/29/2011  . Essential hypertension 11/29/2011  . Hypothyroidism 11/29/2011    Past Surgical History:  Procedure Laterality Date  . ABDOMINAL HYSTERECTOMY    . ANTERIOR CERVICAL DECOMP/DISCECTOMY FUSION N/A 01/23/2014   Procedure: ANTERIOR CERVICAL DECOMPRESSION/DISCECTOMY FUSION CERVICAL FOUR-FIVE,CERVICAL FIVE-SIX,CERVICAL SIX-SEVEN;  Surgeon: Mariam DollarGary P Cram, MD;  Location: [facility];  Service: Neurosurgery;  Laterality: N/A;  . CARDIAC CATHETERIZATION  07/30/2007   75% LAD ,3 stenoses of 75-90% in  RCA;circumflex from proximal RCA with no lesion seen,normal ramus intermediate branh; normal LV systoilc function  . CARDIOVERSION  04/05/2012   Procedure: CARDIOVERSION;  Surgeon: Thurmon FairMihai Croitoru, MD;  Location: [facility];  Service: Cardiovascular;  Laterality: N/A;  . COLONOSCOPY WITH PROPOFOL N/A 11/12/2014   Procedure: COLONOSCOPY WITH PROPOFOL;  Surgeon: Charna ElizabethJyothi Mann, MD;  Location: [facility];  Service: Endoscopy;  Laterality: N/A;  . CORONARY ARTERY BYPASS GRAFT  2009   LIMA-LAD, SVG-RCA (also MAZE & AV fistula ligation)  . EP IMPLANTABLE DEVICE N/A 11/23/2015   Procedure: Pacemaker Implant;  Surgeon: Marinus MawGregg W Taylor, MD;  [facility];  Service: Cardiovascular;  Laterality: N/A;  . ILIAC ARTERY STENT Left 04/06/2005   Ex Iliac - CFA (Smart STENTS -- 7x4 in EIA, 6 x 3 CFA)   . MAZE  2009   along with CABG  . NM CARDIOLITE LTD  12/2013   Non-gated for Afib; No Ischemia or Infarction.  Marland Kitchen. NM MYOVIEW LTD  May 2013   No ischemia or Infarct  . TRANSTHORACIC  ECHOCARDIOGRAM  Jan 2012   EF ~40-45%, global HK; PAP ~45-50 mmHg  . TRANSTHORACIC ECHOCARDIOGRAM  Jan 2017   EF ~50%. No RWMA, Afib. Paradoxical Septal motion. no DD assessment. Mod LA dilation.  PAP ~59 mmHg & dilated IVC. Mod TR. Severe RA dilation.    OB History    No data available       Home Medications    Prior to Admission medications   Medication Sig Start Date End Date Taking? Authorizing Provider  amoxicillin-clavulanate (AUGMENTIN) 875-125 MG tablet Take 1 tablet by mouth 2 (two) times daily. 05/01/16 05/07/16 Yes Vassie Lollarlos Madera, MD  apixaban (ELIQUIS) 5 MG TABS tablet Take 1 tablet (5 mg total) by mouth 2 (two) times daily. 11/19/14  Yes Joseph ArtJessica U Vann, DO  Cholecalciferol (VITAMIN D3) 2000 units TABS Take 1 tablet by mouth daily.   Yes Historical  Provider, MD  diltiazem (CARDIZEM CD) 180 MG 24 hr capsule Take 1 capsule (180 mg total) by mouth daily. 12/17/15 05/12/16 Yes Marykay Lex, MD  feeding supplement (BOOST / RESOURCE BREEZE) LIQD Take 1 Container by mouth 3 (three) times daily between meals. 09/23/15  Yes Darreld Mclean, MD  ferrous sulfate 325 (65 FE) MG tablet Take 1 tablet (325 mg total) by mouth daily with breakfast. 11/24/15  Yes Starleen Arms, MD  furosemide (LASIX) 20 MG tablet Take 1 tablet (20 mg total) by mouth every other day. Take extra dose if having fluid retention, or  leg edema 11/24/15  Yes Starleen Arms, MD  Ipratropium-Albuterol (COMBIVENT RESPIMAT) 20-100 MCG/ACT AERS respimat Inhale 1 puff into the lungs every 6 (six) hours as needed for wheezing or shortness of breath. 05/01/16  Yes Vassie Loll, MD  LANOXIN 62.5 MCG TABS TAKE ONE TABLET BY MOUTH DAILY 04/04/16  Yes Marykay Lex, MD  levothyroxine (SYNTHROID, LEVOTHROID) 75 MCG tablet Take 0.5 tablets (37.5 mcg total) by mouth daily before breakfast. 01/31/16  Yes Marykay Lex, MD  Metoprolol Tartrate 75 MG TABS Take 75 mg by mouth 3 (three) times daily. 01/12/16  Yes Marykay Lex, MD    oxyCODONE-acetaminophen (PERCOCET) 7.5-325 MG tablet Take 1 tablet by mouth every 4 (four) hours as needed for severe pain.   Yes Historical Provider, MD  potassium chloride (K-DUR) 10 MEQ tablet Take 1 tablet (10 mEq total) by mouth every other day. 05/01/16  Yes Vassie Loll, MD  psyllium (HYDROCIL/METAMUCIL) 95 % PACK Take 1 packet by mouth every other day.   Yes Historical Provider, MD  rosuvastatin (CRESTOR) 5 MG tablet TAKE 1 TABLET BY MOUTH IN THE EVENING 12/22/15  Yes Marykay Lex, MD  saccharomyces boulardii (FLORASTOR) 250 MG capsule Take 1 capsule (250 mg total) by mouth 2 (two) times daily. 05/01/16  Yes Vassie Loll, MD  feeding supplement (ENSURE CLINICAL STRENGTH) LIQD Take 237 mLs by mouth 3 (three) times daily with meals. Patient not taking: Reported on 04/29/2016 11/24/15   Leana Roe Elgergawy, MD  senna-docusate (SENOKOT-S) 8.6-50 MG tablet Take 1 tablet by mouth at bedtime as needed for mild constipation. Patient not taking: Reported on 04/29/2016 04/09/15   Richarda Overlie, MD    Family History Family History  Problem Relation Age of Onset  . Heart attack Mother     Late 5s  . Stroke Mother   . Stroke Father   . Heart attack Father     45s  . Heart disease Brother 60    Cardiomegaly  . Stroke Sister   . Heart attack Son 73    Social History Social History  Substance Use Topics  . Smoking status: Former Smoker    Packs/day: 0.50    Types: Cigarettes    Quit date: 01/07/1998  . Smokeless tobacco: Never Used  . Alcohol use No     Allergies   Patient has no known allergies.   Review of Systems Review of Systems  Unable to perform ROS: Mental status change     Physical Exam Updated Vital Signs BP (!) 92/53   Pulse 60   Temp (!) 93.2 F (34 C) (Rectal)   Resp 15   Ht 4\' 11"  (1.499 m)   Wt 74 lb (33.6 kg)   SpO2 94%   BMI 14.95 kg/m   Physical Exam  Constitutional: She appears well-developed.  HENT:  Head: Normocephalic and atraumatic.  Eyes:  EOM are normal.  Neck: Normal range of motion. Neck supple.  Cardiovascular: Normal rate.   Pulmonary/Chest: Effort normal.  Abdominal: Bowel sounds are normal.  Neurological: Abnormal reflex:   Somnolent, oriented x 2  Skin:  cool  Nursing note and vitals reviewed.    ED Treatments / Results  Labs (all labs ordered are listed, but only abnormal results are displayed) Labs Reviewed  COMPREHENSIVE METABOLIC PANEL - Abnormal; Notable for the following:       Result Value   Sodium 133 (*)    Potassium 5.6 (*)    Chloride 96 (*)    Glucose, Bld 145 (*)    BUN 46 (*)    Creatinine, Ser 1.94 (*)    Calcium 7.2 (*)    Total Protein 5.4 (*)    Albumin 3.2 (*)    AST 1,212 (*)    ALT 1,464 (*)    Total Bilirubin 3.5 (*)    GFR calc non Af Amer 24 (*)    GFR calc Af Amer 28 (*)    All other components within normal limits  CBC WITH DIFFERENTIAL/PLATELET - Abnormal; Notable for the following:    RBC 3.31 (*)    Hemoglobin 7.5 (*)    HCT 27.3 (*)    MCH 22.7 (*)    MCHC 27.5 (*)    RDW 19.5 (*)    Neutro Abs 8.8 (*)    Lymphs Abs 0.5 (*)    All other components within normal limits  PROTIME-INR - Abnormal; Notable for the following:    Prothrombin Time >90.0 (*)    INR >10.00 (*)    All other components within normal limits  APTT - Abnormal; Notable for the following:    aPTT 58 (*)    All other components within normal limits  BLOOD GAS, VENOUS - Abnormal; Notable for the following:    pH, Ven 7.123 (*)    pCO2, Ven 65.2 (*)    pO2, Ven 29.3 (*)    Acid-base deficit 7.8 (*)    All other components within normal limits  I-STAT CG4 LACTIC ACID, ED - Abnormal; Notable for the following:    Lactic Acid, Venous 4.98 (*)    All other components within normal limits  CULTURE, BLOOD (ROUTINE X 2)  CULTURE, BLOOD (ROUTINE X 2)  URINE CULTURE  INFLUENZA PANEL BY PCR (TYPE A & B)  URINALYSIS, ROUTINE W REFLEX MICROSCOPIC  BRAIN NATRIURETIC PEPTIDE  TROPONIN I    PROCALCITONIN  LACTIC ACID, PLASMA  LACTIC ACID, PLASMA  CORTISOL  BRAIN NATRIURETIC PEPTIDE  CBC  OCCULT BLOOD X 1 CARD TO LAB, STOOL  HEPATITIS PANEL, ACUTE  LACTATE DEHYDROGENASE  I-STAT CG4 LACTIC ACID, ED  TYPE AND SCREEN  PREPARE FRESH FROZEN PLASMA    EKG  EKG Interpretation  Date/Time:  Thursday May 04 2016 14:07:21 EST Ventricular Rate:  78 PR Interval:    QRS Duration: 106 QT Interval:  351 QTC Calculation: 423 R Axis:   111 Text Interpretation:  Ventricular-paced complexes No further rhythm analysis attempted due to paced rhythm Right axis deviation Low voltage, extremity and precordial leads Borderline repolarization abnormality No acute changes Confirmed by Rhunette Croft, MD, Janey Genta [phone]) on 05/26/2016 4:52:38 PM       Radiology Dg Chest Port 1 View  Result Date: 05/25/2016 CLINICAL DATA:  Pneumonia diagnosed 5 days ago, persists symptoms. Shortness of breath and weakness. Former smoker. History of COPD and atrial fibrillation. EXAM: PORTABLE CHEST 1 VIEW COMPARISON:  Chest x-ray and chest  CT scan of April 30, 2015 2018. FINDINGS: The lungs are reasonably well inflated. There are bilateral pleural effusions which have increased in volume. The cardiac silhouette is enlarged. The pulmonary vascularity is engorged the retrocardiac region on the left demonstrates increased density. There is calcification in the wall of the aortic arch. The patient has undergone previous CABG. The ICD is in stable position. The bony thorax exhibits no acute abnormality. IMPRESSION: CHF with pulmonary interstitial edema and increased pleural effusion volume. Underlying COPD. Retrocardiac density is more conspicuous and may reflect atelectasis or pneumonia. Electronically Signed   By: David  [place] M.D.   On: 05-09-16 15:07    Procedures Procedures (including critical care time)  CRITICAL CARE Performed by: Derwood Kaplan   Total critical care time: 50 minutes  Critical care  time was exclusive of separately billable procedures and treating other patients.  Critical care was necessary to treat or prevent imminent or life-threatening deterioration.  Critical care was time spent personally by me on the following activities: development of treatment plan with patient and/or surrogate as well as nursing, discussions with consultants, evaluation of patient's response to treatment, examination of patient, obtaining history from patient or surrogate, ordering and performing treatments and interventions, ordering and review of laboratory studies, ordering and review of radiographic studies, pulse oximetry and re-evaluation of patient's condition.   Medications Ordered in ED Medications  vancomycin (VANCOCIN) 500 mg in sodium chloride 0.9 % 100 mL IVPB (not administered)  0.9 %  sodium chloride infusion (not administered)  phytonadione (VITAMIN K) 10 mg in dextrose 5 % 50 mL IVPB (10 mg Intravenous New Bag/Given 05/09/16 1638)  0.9 %  sodium chloride infusion (not administered)  sodium chloride 0.9 % bolus 500 mL (not administered)  0.9 %  sodium chloride infusion (not administered)  famotidine (PEPCID) IVPB 20 mg premix (not administered)  hydrocortisone sodium succinate (SOLU-CORTEF) 100 MG injection 50 mg (not administered)  norepinephrine (LEVOPHED) 4 mg in dextrose 5 % 250 mL (0.016 mg/mL) infusion (not administered)  sodium chloride 0.9 % bolus 1,000 mL (0 mLs Intravenous Stopped 05-09-2016 1607)  ceFEPIme (MAXIPIME) 2 g in dextrose 5 % 50 mL IVPB (2 g Intravenous New Bag/Given May 09, 2016 1518)  vancomycin (VANCOCIN) 500 mg in sodium chloride 0.9 % 100 mL IVPB (500 mg Intravenous Restarted 05-09-16 1627)  norepinephrine (LEVOPHED) 4 mg in dextrose 5 % 250 mL (0.016 mg/mL) infusion (6 mcg/min Intravenous Rate/Dose Change 2016/05/09 1642)  sodium chloride 0.9 % bolus 1,000 mL (0 mLs Intravenous Stopped 05/09/16 1642)     Initial Impression / Assessment and Plan / ED Course  I have  reviewed the triage vital signs and the nursing notes.  Pertinent labs & imaging results that were available during my care of the patient were reviewed by me and considered in my medical decision making (see chart for details).  Clinical Course as of May 04 1656  Thu 05-09-2016  1624 Pt has received 2 liters ivf. Norepi started. CCM consulted.  [AN]  1657 Sepsis reassessment completed. Pt is on pressors, MAP is at 66 now, goal is set at 55 per CCM. Cool to touch. Pt has palpable pulse over the groin bilaterally.  [AN]    Clinical Course User Index [AN] Derwood Kaplan, MD    76 y.o.femalewith a history of chronic AFib on eliquis, CAD s/p CABG, chronic CHF, COPD, h/o CVA, CKD, pulm HTN.  Pt is altered, hypotensive, hypothermic, hypoxic. Pt is in shock - but the shock could  be Cardiogenic vs. Septic Shock.  No WC elevation. Resuscitation in the ER started.  CCM consulted. Norepinephrine to be given if pt's BP doesn't respond to fluids alone. Norepi is 1st line pressor for undifferentiated shock, including Cardiogenic.     Final Clinical Impressions(s) / ED Diagnoses   Final diagnoses:  Shock (HCC)  Septic shock (HCC)  Acute respiratory failure with hypoxia (HCC)  Supratherapeutic INR    New Prescriptions New Prescriptions   No medications on file     Derwood Kaplan, MD 05/12/2016 1700

## 2016-05-04 NOTE — Progress Notes (Signed)
CRITICAL VALUE ALERT  Critical value received: lactic acid 2.3  Date of notification:  2/1  Time of notification:  2100  Critical value read back: yes  Nurse who received alert:  phd  MD notified (1st page):  elink  Time of first page: 2100

## 2016-05-04 NOTE — Progress Notes (Addendum)
ED CM sent a message to [facility] in house coordinator to see if pt could be followed for d/c needs Pt can be followed for d/c needs per response

## 2016-05-05 ENCOUNTER — Inpatient Hospital Stay (HOSPITAL_COMMUNITY): Payer: Medicare HMO

## 2016-05-05 DIAGNOSIS — A419 Sepsis, unspecified organism: Secondary | ICD-10-CM

## 2016-05-05 DIAGNOSIS — R6521 Severe sepsis with septic shock: Secondary | ICD-10-CM

## 2016-05-05 LAB — BASIC METABOLIC PANEL
ANION GAP: 13 (ref 5–15)
BUN: 50 mg/dL — ABNORMAL HIGH (ref 6–20)
CALCIUM: 6.7 mg/dL — AB (ref 8.9–10.3)
CO2: 21 mmol/L — AB (ref 22–32)
Chloride: 99 mmol/L — ABNORMAL LOW (ref 101–111)
Creatinine, Ser: 1.91 mg/dL — ABNORMAL HIGH (ref 0.44–1.00)
GFR, EST AFRICAN AMERICAN: 28 mL/min — AB (ref >60)
GFR, EST NON AFRICAN AMERICAN: 24 mL/min — AB (ref >60)
Glucose, Bld: 121 mg/dL — ABNORMAL HIGH (ref 65–99)
Potassium: 5.9 mmol/L — ABNORMAL HIGH (ref 3.5–5.1)
SODIUM: 133 mmol/L — AB (ref 135–145)

## 2016-05-05 LAB — PROTIME-INR
INR: 6.07 — AB
PROTHROMBIN TIME: 55.8 s — AB (ref 11.4–15.2)

## 2016-05-05 LAB — GLUCOSE, CAPILLARY
GLUCOSE-CAPILLARY: 117 mg/dL — AB (ref 65–99)
GLUCOSE-CAPILLARY: 129 mg/dL — AB (ref 65–99)
GLUCOSE-CAPILLARY: 135 mg/dL — AB (ref 65–99)
GLUCOSE-CAPILLARY: 141 mg/dL — AB (ref 65–99)
GLUCOSE-CAPILLARY: 150 mg/dL — AB (ref 65–99)
Glucose-Capillary: 126 mg/dL — ABNORMAL HIGH (ref 65–99)
Glucose-Capillary: 138 mg/dL — ABNORMAL HIGH (ref 65–99)

## 2016-05-05 LAB — HEPATITIS PANEL, ACUTE
HEP A IGM: NEGATIVE
HEP B C IGM: NEGATIVE
HEP B S AG: NEGATIVE

## 2016-05-05 LAB — CBC
HCT: 30.2 % — ABNORMAL LOW (ref 36.0–46.0)
HEMOGLOBIN: 8.8 g/dL — AB (ref 12.0–15.0)
MCH: 25 pg — AB (ref 26.0–34.0)
MCHC: 29.1 g/dL — AB (ref 30.0–36.0)
MCV: 85.8 fL (ref 78.0–100.0)
PLATELETS: 254 10*3/uL (ref 150–400)
RBC: 3.52 MIL/uL — AB (ref 3.87–5.11)
RDW: 19.3 % — ABNORMAL HIGH (ref 11.5–15.5)
WBC: 12.1 10*3/uL — ABNORMAL HIGH (ref 4.0–10.5)

## 2016-05-05 LAB — CULTURE, BLOOD (ROUTINE X 2)
Culture: NO GROWTH
Culture: NO GROWTH

## 2016-05-05 LAB — APTT: aPTT: 45 seconds — ABNORMAL HIGH (ref 24–36)

## 2016-05-05 LAB — MAGNESIUM: MAGNESIUM: 2.1 mg/dL (ref 1.7–2.4)

## 2016-05-05 LAB — AMMONIA: AMMONIA: 32 umol/L (ref 9–35)

## 2016-05-05 LAB — PROCALCITONIN: PROCALCITONIN: 1.11 ng/mL

## 2016-05-05 LAB — DIGOXIN LEVEL: DIGOXIN LVL: 1.6 ng/mL (ref 0.8–2.0)

## 2016-05-05 LAB — PHOSPHORUS: PHOSPHORUS: 5.5 mg/dL — AB (ref 2.5–4.6)

## 2016-05-05 MED ORDER — LEVOTHYROXINE SODIUM 100 MCG IV SOLR
18.7500 ug | Freq: Every day | INTRAVENOUS | Status: DC
  Administered 2016-05-05 – 2016-05-07 (×3): 18.75 ug via INTRAVENOUS
  Filled 2016-05-05 (×3): qty 5

## 2016-05-05 MED ORDER — FUROSEMIDE 10 MG/ML IJ SOLN
10.0000 mg | Freq: Once | INTRAMUSCULAR | Status: AC
  Administered 2016-05-05: 10 mg via INTRAVENOUS
  Filled 2016-05-05: qty 2

## 2016-05-05 MED ORDER — FAMOTIDINE IN NACL 20-0.9 MG/50ML-% IV SOLN
20.0000 mg | Freq: Every day | INTRAVENOUS | Status: DC
  Administered 2016-05-05 – 2016-05-06 (×2): 20 mg via INTRAVENOUS
  Filled 2016-05-05 (×2): qty 50

## 2016-05-05 MED ORDER — IPRATROPIUM-ALBUTEROL 0.5-2.5 (3) MG/3ML IN SOLN: 3.0000 mL | RESPIRATORY_TRACT | Status: DC | PRN

## 2016-05-05 MED ORDER — NOREPINEPHRINE BITARTRATE 1 MG/ML IV SOLN
0.0000 ug/min | INTRAVENOUS | Status: DC
  Administered 2016-05-05: 7 ug/min via INTRAVENOUS
  Administered 2016-05-05: 10 ug/min via INTRAVENOUS
  Administered 2016-05-05 – 2016-05-06 (×2): 6 ug/min via INTRAVENOUS
  Filled 2016-05-05 (×4): qty 4

## 2016-05-05 NOTE — Progress Notes (Addendum)
NO CHARGE NOTE  Palliative Medicine Team consult was received.   Her daughter has gone home to get clothes to spend the night tonight.  I spoke with her on the phone.  We have scheduled a meeting for tomorrow morning (around [phone]).   If there are urgent needs or questions please call [phone]. Thank you for consulting out team to assist with this patients care.  Romie MinusGene Blenda Wisecup, MD [facility] Palliative Medicine Team [phone]

## 2016-05-05 NOTE — Progress Notes (Signed)
Initial Nutrition Assessment  DOCUMENTATION CODES:   Severe malnutrition in context of chronic illness, Underweight  INTERVENTION:  - RD will monitor for GOC and provide nutrition-related interventions as warranted.  NUTRITION DIAGNOSIS:   Inadequate oral intake related to inability to eat as evidenced by NPO status.  GOAL:   Patient will meet greater than or equal to 90% of their needs  MONITOR:   Diet advancement, Weight trends, Labs, I & O's  REASON FOR ASSESSMENT:   Malnutrition Screening Tool  ASSESSMENT:   76yo female with multiple medical problems including CAD s/p CABG 2009, CKD, previous stroke, AFib on eliquis, Pulmonary HTN with recent admission 1/27-1/29 for sepsis/CAP, elevated LFT's, n/v/d.  She was d/c on augmentin and supplemental O2.  She returned 2/1 with weakness, AMS.   In ER had significant hypotension, lactic acid 5, poor mental status, coagulopathy with INR>10, significantly elevated LFT's, AKI.  PCCM consulted for ICU admit for presumed septic shock.  Pt seen for MST. BMI indicates underweight status. Pt has been NPO since admission. She is currently on NRBM and when she is able to talk speech is incomprehensible. Spoke with daughter, who is at bedside. Daughter is fairly distraught over pt's current condition and her rapid decline over the past 1-2 weeks. Some nutrition-related information was able to be obtained although the majority of conversation was spent with RD providing active listening as daughter talked through pt's many medical conditions and prior surgeries.   Pt usually has a good appetite but her appetite has declined from baseline over the past 2 weeks. She has a partial but it has been ill-fitting in recent months d/t extent of weight loss. Pt is usually able to carry on a meaningful conversation but has been unable (for unknown amount of time).  Physical assessment shows severe muscle and fat wasting to all areas. Daughter reports pt began  losing weight ~2 years ago when she was placed on medication for her thyroid. Unable to obtain UBW or further details on weight from daughter. Per chart review, pt has lost 7 lbs (8.6% body weight) in the past 5 months which, although not significant for time frame, is concerning given underweight status and rapid medical decline.   Medications reviewed; 20 mg IV Pepcid BID, 50 mg IV Solu-cortef QID, sliding scale Novolog, 10 mg IV vitamin K x1 dose yesterday. Labs reviewed; CBGs: 126 and 129 mg/dL this AM, Na: 098133 mmol/L, K: 5.9 mmol/L, Phos: 5.5 mg/dL, Cl: 99 mmol/L, Ca: 6.7 mg/dL, BUN: 50 mg/dL, creatinine: 1.191.91 mg/dL, GFR: 24 mLmin.    Diet Order:  Diet NPO time specified  Skin:  Reviewed, no issues  Last BM:  PTA/unknown  Height:   Ht Readings from Last 1 Encounters:  05/31/2016 4\' 11"  (1.499 m)    Weight:   Wt Readings from Last 1 Encounters:  05/05/16 74 lb 1.2 oz (33.6 kg)    Ideal Body Weight:  42.91 kg  BMI:  Body mass index is 14.96 kg/m.  Estimated Nutritional Needs:   Kcal:  1175-1345 (35-40 kcal/kg)  Protein:  40-50 grams  Fluid:  1.3-1.5 L/day  EDUCATION NEEDS:   No education needs identified at this time    Trenton GammonJessica Dekota Shenk, MS, RD, LDN, CNSC Inpatient Clinical Dietitian Pager # [phone] After hours/weekend pager # [phone]

## 2016-05-05 NOTE — Progress Notes (Signed)
PULMONARY / CRITICAL CARE MEDICINE   Name: [name] MRN: [id number] DOB: [birth year]    ADMISSION DATE:  05/17/2016  REFERRING MD:  EDP   CHIEF COMPLAINT:  Sepsis   BRIEF SUMMARY:  76 y/o F admitted on 2/1 with reports of altered mental status.  Work up notable for hypotension, hypothermia, INR >10, elevated LFT's, AKI and lactic acid elevation.  PCCM admitted for presumed septic shock (no identified source, recent admit 1/27-1/29 for CAP with d/c on augmentin + O2)  PMHx: CAD s/p CABG (2009), CKD, AF on Eliquis, prior CVA, COPD, pulmonary HTN.  Family states prior to last admit, she was independent of ADL's.     SUBJECTIVE: Family reports she is doing about the same as yesterday.  Periods of coherent speech mixed with confusion.  Minimal sleep overnight.    VITAL SIGNS: BP 119/74   Pulse 90   Temp 97.5 F (36.4 C) (Oral)   Resp 11   Ht 4\' 11"  (1.499 m)   Wt 74 lb 1.2 oz (33.6 kg)   SpO2 90%   BMI 14.96 kg/m   HEMODYNAMICS:    VENTILATOR SETTINGS:    INTAKE / OUTPUT: I/O last 3 completed shifts: In: 1817.5 [I.V.:358.5; Blood:1409; IV Piggyback:50] Out: -   PHYSICAL EXAMINATION:   General: cachectic elderly female lying in bed in NAD HEENT: NCAT, PERRL, scleral icterus, MM dry PSY: intermittent confusion Neuro: awakens to voice, tracks provider, spontaneously moving all extremities  CV: s1s2 irr irr, SEM PULM: even/non-labored on NRB, lungs bilaterally diminished NW:GNFA, non-tender, bsx4 active  Extremities: warm/dry, no edema  Skin: pale, no rashes or lesions   LABS:  BMET  Recent Labs Lab 05/01/16 0525 05/09/2016 1455 05/05/16 0414  NA 138 133* 133*  K 3.8 5.6* 5.9*  CL 100* 96* 99*  CO2 32 25 21*  BUN 16 46* 50*  CREATININE 0.76 1.94* 1.91*  GLUCOSE 95 145* 121*    Electrolytes  Recent Labs Lab 05/01/16 0525 05/14/2016 1455 05/05/16 0414  CALCIUM 8.0* 7.2* 6.7*  MG  --   --  2.1  PHOS  --   --  5.5*    CBC  Recent Labs Lab  05/28/2016 1455 05/08/2016 1824 05/05/16 0414  WBC 10.0 10.5 12.1*  HGB 7.5* 6.8* 8.8*  HCT 27.3* 25.1* 30.2*  PLT 300 265 254    Coag's  Recent Labs Lab 05/31/2016 1455 05/05/16 0414  APTT 58* 45*  INR >10.00* 6.07*    Sepsis Markers  Recent Labs Lab 05/22/2016 1451 05/30/2016 1455 05/15/2016 1701 05/07/2016 2018 05/05/16 0414  LATICACIDVEN 4.98*  --  3.3* 2.3*  --   PROCALCITON  --  1.31  --   --  1.11    ABG No results for input(s): PHART, PCO2ART, PO2ART in the last 168 hours.  Liver Enzymes  Recent Labs Lab 04/30/16 0549 05/01/16 0525 05/27/2016 1455  AST 305* 181* 1,212*  ALT 394* 320* 1,464*  ALKPHOS 125 125 120  BILITOT 2.1* 1.6* 3.5*  ALBUMIN 3.2* 3.2* 3.2*    Cardiac Enzymes  Recent Labs Lab 04/29/16 1736 05/07/2016 1455  TROPONINI <0.03 0.04*    Glucose  Recent Labs Lab 04/29/16 1026 05/19/2016 1730 05/30/2016 2352 05/05/16 0404 05/05/16 0735  GLUCAP 169* 148* 141* 129* 126*    Imaging Dg Chest Port 1 View  Result Date: 05/05/2016 CLINICAL DATA:  Pneumonia. EXAM: PORTABLE CHEST 1 VIEW COMPARISON:  05/12/2016 . FINDINGS: Cardiac pacer stable position. Prior CABG. Cardiomegaly. Diffuse progressive bilateral pulmonary infiltrates  and bilateral pleural effusions are noted consistent congestive heart failure. Bilateral pneumonia cannot be excluded. Prior cervical spine fusion. IMPRESSION: 1.  Cardiac pacer stable position.  Prior CABG. 2. Cardiomegaly with progressive bilateral pulmonary infiltrates/edema and bilateral pleural effusions consistent with congestive heart failure. Bilateral pneumonia cannot be excluded. Electronically Signed   By: Maisie Fus  Register   On: 05/05/2016 07:22   Dg Chest Port 1 View  Result Date: 05/26/2016 CLINICAL DATA:  Pneumonia diagnosed 5 days ago, persists symptoms. Shortness of breath and weakness. Former smoker. History of COPD and atrial fibrillation. EXAM: PORTABLE CHEST 1 VIEW COMPARISON:  Chest x-ray and chest CT scan of  April 30, 2015 2018. FINDINGS: The lungs are reasonably well inflated. There are bilateral pleural effusions which have increased in volume. The cardiac silhouette is enlarged. The pulmonary vascularity is engorged the retrocardiac region on the left demonstrates increased density. There is calcification in the wall of the aortic arch. The patient has undergone previous CABG. The ICD is in stable position. The bony thorax exhibits no acute abnormality. IMPRESSION: CHF with pulmonary interstitial edema and increased pleural effusion volume. Underlying COPD. Retrocardiac density is more conspicuous and may reflect atelectasis or pneumonia. Electronically Signed   By: David  [place] M.D.   On: 05/18/2016 15:07     STUDIES:  CT head 2/1 >> neg acute  CT Chest/abd/pelvis 1/27 >> Advanced centrilobular emphysema and hyperinflation, consistent with COPD.  Superimposed mild interstitial pulmonary edema.  Moderate in size bilateral simple appearing pleural effusions.Widespread atelectatic changes in the dependent portions of the lungs. More confluent airspace opacity in the right middle lobe may represent round atelectasis/airspace consolidation or less likely pulmonary mass. Attention on follow-up is recommended.Small amount of free pelvic fluid and generalized mesenteric stranding of the abdomen.  CULTURES: BC x 2 2/1 >> Urine  2/1 >> UA 2/1 >> negative  Hepatitis Panel 2/1 >> negative   ANTIBIOTICS: vanc  2/1 >> Cefepime  2/1 >> 2/1 Imipenem 2/1 >>  SIGNIFICANT EVENTS: 1/27 - 1/29  Admit for CAP  2/01 Admit [facility], shock MODS, hcap? coagulapthy  LINES/TUBES: Not candidate  DISCUSSION: 76 y/o F with recent discharge from hospital 1/29 for CAP admitted 2/1 with hypotension, hypothermia, INR>10, AKI and elevated LFT's.  Concern for sepsis with likely HCAP.  DNR but aggressive medical care for now.   ASSESSMENT / PLAN:  PULMONARY Acute on chronic hypoxic respiratory failure - multifactorial  r/t PNA, COPD, bilat pleural effusion, AMS  Bilateral Pleural Effusions Suspected HCAP  Pulmonary HTN - PA peak pressure 59 11/2015 COPD  P:   Lasix 10 mg IV x1 DNR / DNI  Not a candidate for NIMV given aspiration risk  See ID  Trend intermittent CXR   CARDIOVASCULAR Septic shock - possible pulmonary source Pulmonary HTN - most recent PA pressure (august 2017) in setting moderate TR  AFib - on Eliquis  CAD s/p CABG 2009 P:  DNR  ICU monitoring of hemodynamics  Wean levophed to off for MAP > 55 or SBP > 85 with no muscle mass  Max levophed at 10 mcg No central line Hold eliquis Trend BNP  No role for stress steroids > cortisol 71.  D/c solucortef (confirmed lab drawn prior to administration) KVO IVF  RENAL AKI on CKD - baseline Scr ~0.9 Hyperkalemia - mild  Hyperphosphatemia  P:   Trend BMP / UOP  Place foley for I/O's, comfort  Replace electrolytes as indicated    GASTROINTESTINAL Nausea/vomiting  Liver dysfunction -  unclear etiology. No evidence obstructive etiology on CT. ?gilbert syndrome, congestive hepatopathy.   Acute hepatitis panel negative.  Small amt pelvic free fluid - ? abd source contributory to sepsis  P:   Trend LFT's  NPO  Trend LDH Monitor INR May need to consider feeding if family continues to want aggressive measures > not making much clinical progress  HEMATOLOGIC Coagulopathy - r/t liver dysfunction of unclear etiology. INR>10 (sepsis in setting eliquis ( arf) poor clearance) P:  Trend CBC  SCD's for DVT prophylaxis   INFECTIOUS Sepsis - PNA +/- ?abd source  P:   D2/x abx  Monitor CBC / fever curve  Follow cultures as above  Trend PCT  Consider d/c vanco with AKI  May need repeat CT abd > last done 1/27  ENDOCRINE Hyperglycemia  AI ruled out - cortisol 71 on admit TSH wnl Hypothermia P:   Monitor glucose on BMP   NEUROLOGIC Acute Encephalopathy - likely multifactorial in the setting of sepsis, respiratory failure,  failure to thrive P:  Assess digoxin, ammonia in am  If develops distress, comfort care per daughter     FAMILY  - Updates: Daughter (DeDee) updated on plan of care 2/2.  Prior discussion for DNR with aggressive medical care.  Cap on levophed.  Consult palliative care.     CC Time: 30 minutes   Canary BrimBrandi Brenda Orne, NP-C [facility] Pgr: [phone] or if no answer [phone] 05/05/2016, 11:48 AM

## 2016-05-06 ENCOUNTER — Inpatient Hospital Stay (HOSPITAL_COMMUNITY): Payer: Medicare HMO

## 2016-05-06 DIAGNOSIS — K759 Inflammatory liver disease, unspecified: Secondary | ICD-10-CM

## 2016-05-06 DIAGNOSIS — Z515 Encounter for palliative care: Secondary | ICD-10-CM

## 2016-05-06 DIAGNOSIS — I2781 Cor pulmonale (chronic): Secondary | ICD-10-CM

## 2016-05-06 DIAGNOSIS — Z7189 Other specified counseling: Secondary | ICD-10-CM

## 2016-05-06 DIAGNOSIS — N179 Acute kidney failure, unspecified: Secondary | ICD-10-CM

## 2016-05-06 DIAGNOSIS — J449 Chronic obstructive pulmonary disease, unspecified: Secondary | ICD-10-CM

## 2016-05-06 LAB — GLUCOSE, CAPILLARY
GLUCOSE-CAPILLARY: 142 mg/dL — AB (ref 65–99)
GLUCOSE-CAPILLARY: 144 mg/dL — AB (ref 65–99)
GLUCOSE-CAPILLARY: 103 mg/dL — AB (ref 65–99)
GLUCOSE-CAPILLARY: 136 mg/dL — AB (ref 65–99)
GLUCOSE-CAPILLARY: 125 mg/dL — AB (ref 65–99)
Glucose-Capillary: 132 mg/dL — ABNORMAL HIGH (ref 65–99)

## 2016-05-06 LAB — COMPREHENSIVE METABOLIC PANEL
ALT: 907 U/L — ABNORMAL HIGH (ref 14–54)
ANION GAP: 13 (ref 5–15)
AST: 577 U/L — ABNORMAL HIGH (ref 15–41)
Albumin: 3.5 g/dL (ref 3.5–5.0)
Alkaline Phosphatase: 117 U/L (ref 38–126)
BUN: 51 mg/dL — ABNORMAL HIGH (ref 6–20)
CHLORIDE: 99 mmol/L — AB (ref 101–111)
CO2: 24 mmol/L (ref 22–32)
Calcium: 6.8 mg/dL — ABNORMAL LOW (ref 8.9–10.3)
Creatinine, Ser: 1.62 mg/dL — ABNORMAL HIGH (ref 0.44–1.00)
GFR, EST AFRICAN AMERICAN: 34 mL/min — AB (ref >60)
GFR, EST NON AFRICAN AMERICAN: 30 mL/min — AB (ref >60)
Glucose, Bld: 137 mg/dL — ABNORMAL HIGH (ref 65–99)
Potassium: 3.5 mmol/L (ref 3.5–5.1)
Sodium: 136 mmol/L (ref 135–145)
Total Bilirubin: 4 mg/dL — ABNORMAL HIGH (ref 0.3–1.2)
Total Protein: 6 g/dL — ABNORMAL LOW (ref 6.5–8.1)

## 2016-05-06 LAB — CBC
HCT: 27.8 % — ABNORMAL LOW (ref 36.0–46.0)
HEMOGLOBIN: 8.5 g/dL — AB (ref 12.0–15.0)
MCH: 25 pg — ABNORMAL LOW (ref 26.0–34.0)
MCHC: 30.6 g/dL (ref 30.0–36.0)
MCV: 81.8 fL (ref 78.0–100.0)
PLATELETS: 134 10*3/uL — AB (ref 150–400)
RBC: 3.4 MIL/uL — AB (ref 3.87–5.11)
RDW: 19.9 % — ABNORMAL HIGH (ref 11.5–15.5)
WBC: 10.7 10*3/uL — AB (ref 4.0–10.5)

## 2016-05-06 LAB — URINE CULTURE

## 2016-05-06 LAB — BRAIN NATRIURETIC PEPTIDE: B NATRIURETIC PEPTIDE 5: 968.3 pg/mL — AB (ref 0.0–100.0)

## 2016-05-06 LAB — MAGNESIUM: MAGNESIUM: 1.9 mg/dL (ref 1.7–2.4)

## 2016-05-06 LAB — PHOSPHORUS: PHOSPHORUS: 3.2 mg/dL (ref 2.5–4.6)

## 2016-05-06 MED ORDER — OXYCODONE-ACETAMINOPHEN 5-325 MG PO TABS
1.0000 | ORAL_TABLET | ORAL | Status: DC | PRN
  Administered 2016-05-06 – 2016-05-09 (×8): 1 via ORAL
  Filled 2016-05-06 (×9): qty 1

## 2016-05-06 NOTE — Progress Notes (Signed)
Palliative care brief note:  I met with patient and her daughter this AM.    Her daughter understands that patient is critically ill and may die this admission.  She desires to continue current therapy, with no escalation of care, for another 24-48 hours to see if her mother responds to current therapies.  If worsens or does not improve, plan will be for full comfort care.  We did introduce concept of hospice this visit.  She thinks that her mother would like to return to their home if focus becomes comfort (assuming she survives weaning levo.)  She does desire for her mother to have bites and sips with understanding there is high risk of aspiration.  Will defer this decision to primary as she is not comfort care at this point.  Full note to follow.  Gene Freeman, MD [facility] Team [phone]  

## 2016-05-06 NOTE — Progress Notes (Signed)
PULMONARY / CRITICAL CARE MEDICINE   Name: [name] MRN: [id number] DOB: 1940/08/13    ADMISSION DATE:  May 25, 2016  REFERRING MD:  EDP   CHIEF COMPLAINT:  Sepsis   BRIEF SUMMARY:  76 y/o F admitted on 2/1 with reports of altered mental status.  Work up notable for hypotension, hypothermia, INR >10, elevated LFT's, AKI and lactic acid elevation.  PCCM admitted for presumed septic shock (no identified source, recent admit 1/27-1/29 for CAP with d/c on augmentin + O2)  PMHx: CAD s/p CABG (2009), CKD, AF on Eliquis, prior CVA, COPD, pulmonary HTN.  Family states prior to last admit, she was independent of ADL's.     SUBJECTIVE:  Remains on norepi 4 via PIV  VITAL SIGNS: BP (!) 141/59   Pulse 96   Temp 97.2 F (36.2 C) (Axillary)   Resp (!) 22   Ht 4\' 11"  (1.499 m)   Wt 42.8 kg (94 lb 5.7 oz)   SpO2 99%   BMI 19.06 kg/m   HEMODYNAMICS:    VENTILATOR SETTINGS:    INTAKE / OUTPUT: I/O last 3 completed shifts: In: 2659.8 [I.V.:1050.8; Blood:1409; IV Piggyback:200] Out: 1200 [Urine:1200]  PHYSICAL EXAMINATION:   General: weak, thin woman, awake HEENT: scleral icterus, dry OP Neuro: globally weak, moves B UE CV: irreg irreg, 80's Resp: very distant, no wheeze Abd: moderate RUQ tenderness Ext: thin, no edema Skin: no rash    LABS:  BMET  Recent Labs Lab 05/01/16 0525 2016-05-25 1455 05/05/16 0414  NA 138 133* 133*  K 3.8 5.6* 5.9*  CL 100* 96* 99*  CO2 32 25 21*  BUN 16 46* 50*  CREATININE 0.76 1.94* 1.91*  GLUCOSE 95 145* 121*    Electrolytes  Recent Labs Lab 05/01/16 0525 05-25-16 1455 05/05/16 0414  CALCIUM 8.0* 7.2* 6.7*  MG  --   --  2.1  PHOS  --   --  5.5*    CBC  Recent Labs Lab 2016/05/25 1824 05/05/16 0414 05/06/16 0900  WBC 10.5 12.1* 10.7*  HGB 6.8* 8.8* 8.5*  HCT 25.1* 30.2* 27.8*  PLT 265 254 134*    Coag's  Recent Labs Lab 05/25/16 1455 05/05/16 0414  APTT 58* 45*  INR >10.00* 6.07*    Sepsis  Markers  Recent Labs Lab 05/25/2016 1451 25-May-2016 1455 2016/05/25 1701 2016/05/25 2018 05/05/16 0414  LATICACIDVEN 4.98*  --  3.3* 2.3*  --   PROCALCITON  --  1.31  --   --  1.11    ABG No results for input(s): PHART, PCO2ART, PO2ART in the last 168 hours.  Liver Enzymes  Recent Labs Lab 04/30/16 0549 05/01/16 0525 2016-05-25 1455  AST 305* 181* 1,212*  ALT 394* 320* 1,464*  ALKPHOS 125 125 120  BILITOT 2.1* 1.6* 3.5*  ALBUMIN 3.2* 3.2* 3.2*    Cardiac Enzymes  Recent Labs Lab 04/29/16 1736 May 25, 2016 1455  TROPONINI <0.03 0.04*    Glucose  Recent Labs Lab 05/05/16 1235 05/05/16 1613 05/05/16 2001 05/05/16 2355 05/06/16 0421 05/06/16 0833  GLUCAP 117* 135* 138* 150* 142* 144*    Imaging Dg Chest Port 1 View  Result Date: 05/06/2016 CLINICAL DATA:  76 year old female with chronic pleural effusions and cardiomyopathy. Possible Sepsis. Initial encounter. EXAM: PORTABLE CHEST 1 VIEW COMPARISON:  05/05/2016 and earlier. FINDINGS: Stable left chest single lead cardiac pacemaker. Stable cardiomegaly and mediastinal contours. Sequelae of CABG. Calcified aortic atherosclerosis. Bilateral veiling opacity compatible with chronic pleural effusions. Left side effusion appears increased since 04/29/2016  and moderate. Superimposed perihilar atelectasis. Interval increased pulmonary vascular congestion. No pneumothorax. Stable visualized osseous structures. Prior median sternotomy and cervical ACDF. IMPRESSION: 1. Interval increased pulmonary vascular congestion suspicious for acute interstitial edema. 2. Chronic pleural effusions, that on the left appears progressed since 04/29/2016 and now moderate. Increased atelectasis. 3. Chronic cardiomegaly.  Calcified aortic atherosclerosis. Electronically Signed   By: Odessa FlemingH  Hall M.D.   On: 05/06/2016 07:29     STUDIES:  CT head 2/1 >> neg acute  CT Chest/abd/pelvis 1/27 >> Advanced centrilobular emphysema and hyperinflation, consistent  with COPD.  Superimposed mild interstitial pulmonary edema.  Moderate in size bilateral simple appearing pleural effusions.Widespread atelectatic changes in the dependent portions of the lungs. More confluent airspace opacity in the right middle lobe may represent round atelectasis/airspace consolidation or less likely pulmonary mass. Attention on follow-up is recommended.Small amount of free pelvic fluid and generalized mesenteric stranding of the abdomen.  CULTURES: BC x 2 2/1 >> Urine  2/1 >> multiple species UA 2/1 >> negative  Hepatitis Panel 2/1 >> negative   ANTIBIOTICS: vanc  2/1 >> Cefepime  2/1 >> 2/1 Imipenem 2/1 >>  SIGNIFICANT EVENTS: 1/27 - 1/29  Admit for CAP  2/01 Admit [facility], shock MODS, hcap? coagulapthy  LINES/TUBES: Not candidate  DISCUSSION: 76 y/o F with recent discharge from hospital 1/29 for CAP admitted 2/1 with hypotension, hypothermia, INR>10, AKI and elevated LFT's.  Concern for sepsis with likely HCAP.  DNR but aggressive medical care for now.   ASSESSMENT / PLAN:  PULMONARY Acute on chronic hypoxic respiratory failure - multifactorial r/t PNA, COPD, bilat pleural effusion, AMS  Bilateral Pleural Effusions Suspected HCAP  Pulmonary HTN - PA peak pressure 59 11/2015 COPD  P:   Broad Abx as below DuoNeb ordered prn Follow CXR, note some mild increase edema 2/3 Appreciate Palliative care assistance, confirmed DNR/I status  CARDIOVASCULAR Septic shock - possible pulmonary source Pulmonary HTN - most recent PA pressure 59mmHg (august 2017) in setting moderate TR  AFib - on Eliquis  CAD s/p CABG 2009 P:  Weaning norepi via PIV, currently at 4 Will defer CVC Anticoagulation on hold May require some further diuresis given her severe secondary PAH  RENAL AKI on CKD - baseline Scr ~0.9 Hyperkalemia - mild  Hyperphosphatemia  P:   Follow BMP, labs pending this am treat electrolyte abnormalities as indicated     GASTROINTESTINAL Nausea/vomiting, improved Liver dysfunction - unclear etiology. No evidence obstructive etiology on CT. ?gilbert syndrome, congestive hepatopathy.   Acute hepatitis panel negative.  Small amt pelvic free fluid - ? abd source contributory to sepsis  P:   Follow LFT and coags Discussed taking a diet in light of wishes to enhance comfort. I agree with starting soft diet. Pt and her daughter understand there is some aspiration risk, willing to accept this.   HEMATOLOGIC Coagulopathy - r/t liver dysfunction of unclear etiology. INR>10 (sepsis in setting eliquis ( arf) poor clearance) Thrombocytopenia  P:  Follow CBC SCD's in place  INFECTIOUS Sepsis - PNA +/- ?abd source, ? UTI P:   Broad abx day 3/x Follow cx data, note mixed bacteria in urine cx Plan stop vanco 2/3 as cx's remain unrevealing Consider repeat Ct abd depending on course  ENDOCRINE Hyperglycemia  AI ruled out - cortisol 71 on admit TSH wnl Hypothermia P:   Monitor glucose on BMP   NEUROLOGIC Acute Encephalopathy - likely multifactorial in the setting of sepsis, respiratory failure, failure to thrive >> improving 2/3 P:  Following ammonia and digoxin level Supportive care     FAMILY  - Updates:  I spoke with pt and daughter today 2/3. Agree with DNR/I status, possible transition to comfort if she is not recovering next 24h or so. Also agree with initiating PO diet, understanding the potential risk of aspiration.    Independent CC time 35 minutes  Levy Pupa, MD, PhD 05/06/2016, 9:55 AM [facility] [phone] or if no answer [phone]

## 2016-05-07 LAB — CBC
HCT: 28.6 % — ABNORMAL LOW (ref 36.0–46.0)
Hemoglobin: 8.3 g/dL — ABNORMAL LOW (ref 12.0–15.0)
MCH: 23.5 pg — ABNORMAL LOW (ref 26.0–34.0)
MCHC: 29 g/dL — ABNORMAL LOW (ref 30.0–36.0)
MCV: 81 fL (ref 78.0–100.0)
PLATELETS: 91 10*3/uL — AB (ref 150–400)
RBC: 3.53 MIL/uL — AB (ref 3.87–5.11)
RDW: 20.5 % — ABNORMAL HIGH (ref 11.5–15.5)
WBC: 8.5 10*3/uL (ref 4.0–10.5)

## 2016-05-07 LAB — COMPREHENSIVE METABOLIC PANEL
ALBUMIN: 3.3 g/dL — AB (ref 3.5–5.0)
ALT: 712 U/L — ABNORMAL HIGH (ref 14–54)
AST: 347 U/L — AB (ref 15–41)
Alkaline Phosphatase: 120 U/L (ref 38–126)
Anion gap: 10 (ref 5–15)
BUN: 42 mg/dL — AB (ref 6–20)
CHLORIDE: 103 mmol/L (ref 101–111)
CO2: 26 mmol/L (ref 22–32)
Calcium: 7.4 mg/dL — ABNORMAL LOW (ref 8.9–10.3)
Creatinine, Ser: 1.16 mg/dL — ABNORMAL HIGH (ref 0.44–1.00)
GFR calc Af Amer: 52 mL/min — ABNORMAL LOW (ref >60)
GFR, EST NON AFRICAN AMERICAN: 45 mL/min — AB (ref >60)
Glucose, Bld: 116 mg/dL — ABNORMAL HIGH (ref 65–99)
POTASSIUM: 2.8 mmol/L — AB (ref 3.5–5.1)
Sodium: 139 mmol/L (ref 135–145)
Total Bilirubin: 3.9 mg/dL — ABNORMAL HIGH (ref 0.3–1.2)
Total Protein: 5.6 g/dL — ABNORMAL LOW (ref 6.5–8.1)

## 2016-05-07 LAB — GLUCOSE, CAPILLARY
GLUCOSE-CAPILLARY: 115 mg/dL — AB (ref 65–99)
GLUCOSE-CAPILLARY: 115 mg/dL — AB (ref 65–99)
GLUCOSE-CAPILLARY: 174 mg/dL — AB (ref 65–99)
Glucose-Capillary: 121 mg/dL — ABNORMAL HIGH (ref 65–99)
Glucose-Capillary: 117 mg/dL — ABNORMAL HIGH (ref 65–99)

## 2016-05-07 LAB — PROTIME-INR
INR: 3.16
Prothrombin Time: 33.2 seconds — ABNORMAL HIGH (ref 11.4–15.2)

## 2016-05-07 LAB — BASIC METABOLIC PANEL
ANION GAP: 9 (ref 5–15)
BUN: 33 mg/dL — ABNORMAL HIGH (ref 6–20)
CHLORIDE: 105 mmol/L (ref 101–111)
CO2: 28 mmol/L (ref 22–32)
Calcium: 8 mg/dL — ABNORMAL LOW (ref 8.9–10.3)
Creatinine, Ser: 1.14 mg/dL — ABNORMAL HIGH (ref 0.44–1.00)
GFR calc Af Amer: 53 mL/min — ABNORMAL LOW (ref >60)
GFR calc non Af Amer: 46 mL/min — ABNORMAL LOW (ref >60)
Glucose, Bld: 185 mg/dL — ABNORMAL HIGH (ref 65–99)
POTASSIUM: 3.8 mmol/L (ref 3.5–5.1)
SODIUM: 142 mmol/L (ref 135–145)

## 2016-05-07 LAB — MAGNESIUM: MAGNESIUM: 1.9 mg/dL (ref 1.7–2.4)

## 2016-05-07 LAB — LACTIC ACID, PLASMA: LACTIC ACID, VENOUS: 1.4 mmol/L (ref 0.5–1.9)

## 2016-05-07 MED ORDER — METOPROLOL TARTRATE 5 MG/5ML IV SOLN
2.5000 mg | Freq: Once | INTRAVENOUS | Status: AC
  Administered 2016-05-07: 2.5 mg via INTRAVENOUS
  Filled 2016-05-07: qty 5

## 2016-05-07 MED ORDER — FAMOTIDINE 20 MG PO TABS
20.0000 mg | ORAL_TABLET | Freq: Every day | ORAL | Status: DC
  Administered 2016-05-07 – 2016-05-09 (×3): 20 mg via ORAL
  Filled 2016-05-07 (×3): qty 1

## 2016-05-07 MED ORDER — DILTIAZEM HCL 30 MG PO TABS
30.0000 mg | ORAL_TABLET | Freq: Two times a day (BID) | ORAL | Status: DC
  Administered 2016-05-07 – 2016-05-08 (×3): 30 mg via ORAL
  Filled 2016-05-07 (×2): qty 1

## 2016-05-07 MED ORDER — LEVOTHYROXINE SODIUM 75 MCG PO TABS
37.5000 ug | ORAL_TABLET | Freq: Every day | ORAL | Status: DC
  Administered 2016-05-08 – 2016-05-09 (×2): 37.5 ug via ORAL
  Filled 2016-05-07 (×3): qty 0.5

## 2016-05-07 MED ORDER — SODIUM CHLORIDE 0.9 % IV SOLN
30.0000 meq | INTRAVENOUS | Status: AC
  Administered 2016-05-07 (×2): 30 meq via INTRAVENOUS
  Filled 2016-05-07 (×2): qty 15

## 2016-05-07 MED ORDER — AMIODARONE HCL IN DEXTROSE 360-4.14 MG/200ML-% IV SOLN
30.0000 mg/h | INTRAVENOUS | Status: DC
  Administered 2016-05-08: 30 mg/h via INTRAVENOUS
  Filled 2016-05-07: qty 200

## 2016-05-07 MED ORDER — AMIODARONE LOAD VIA INFUSION
150.0000 mg | Freq: Once | INTRAVENOUS | Status: AC
  Administered 2016-05-07: 150 mg via INTRAVENOUS
  Filled 2016-05-07: qty 83.34

## 2016-05-07 MED ORDER — POTASSIUM CHLORIDE 20 MEQ/15ML (10%) PO SOLN
40.0000 meq | ORAL | Status: DC
  Filled 2016-05-07: qty 30

## 2016-05-07 MED ORDER — AMIODARONE HCL IN DEXTROSE 360-4.14 MG/200ML-% IV SOLN
INTRAVENOUS | Status: AC
  Filled 2016-05-07: qty 200

## 2016-05-07 MED ORDER — AMIODARONE HCL IN DEXTROSE 360-4.14 MG/200ML-% IV SOLN
60.0000 mg/h | INTRAVENOUS | Status: AC
  Administered 2016-05-07 (×2): 60 mg/h via INTRAVENOUS
  Filled 2016-05-07: qty 200

## 2016-05-07 NOTE — Consult Note (Signed)
Consultation Note Date: 05/07/2016   Patient Name: [name]  DOB: [birth year]  MRN: [id number]  Age / Sex: 76 y.o., female  PCP: Sandi Mariscal, MD Referring Physician: Raylene Miyamoto, MD  Reason for Consultation: Establishing goals of care  HPI/Patient Profile: 76 y.o. female  with past medical history of CAD status post CABG, CAD, atrial fibrillation, prior CVA, COPD, pulmonary hypertension admitted on 05/17/2016 with septic shock likely secondary to HCAP.  Palliative consulted for goals of care.   Clinical Assessment and Goals of Care: Met today with patient and her daughter, DeeDee.  Ms. Lindor remains confused and was not really able to participate in conversation.  Her daughter and I discussed what is most important to her mother. She reports this includes her family, her independence, and being in their home.  We discussed her clinical course this point in time. She reports the doctors have been doing the job talking with her about her mother's condition. She understands she remains critically ill and there is a high chance that she will continue to decline.  We discussed pathways forward including continuation of current therapy versus refocusing care on her comfort. Her daughter reports that she does not want her mother to suffer if she is approaching the end of her life, however, she has not been seeing things would make her think her mother is suffering. She would like to continue with current therapy for another 24-48 hours to see how her mother responds. She reports if her mother is not getting better or worsens, focus of care should be on comfort. At that point in time, she would like to see if she can possibly take her mother home because she thinks this would be her wish. 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 We did  discuss hospice and how they can assist in the goal of being at home with a focus on comfort. She is agreeable to this if the focus of care does become comfort.  SUMMARY OF RECOMMENDATIONS   - Continue current therapy for another 24-48 hours. If she does not improve or worsens, plan will be for full comfort care. We did discuss concept of hospice this visit. She thinks that the focus of care becomes comfort, her mother would want to return to their home if she can be weaned from norepi. - Patient would like have a diet, primarily bites and sips for comfort. We discussed risk of aspiration. Will defer to primary for final decision, but I think this would add to quality of her life while waiting to see how she responds to medical therapy.  Code Status/Advance Care Planning:  DNR  Palliative Prophylaxis:   Aspiration, Bowel Regimen, Delirium Protocol and Frequent Pain Assessment  Psycho-social/Spiritual:   Desire for further Chaplaincy support: Did not address today  Additional Recommendations: Caregiving  Support/Resources  Prognosis:   Unable to determine  Discharge Planning: To Be  Determined      Primary Diagnoses: Present on Admission: . Shock ([place])   I have reviewed the medical record, interviewed the patient and family, and examined the patient. The following aspects are pertinent.  Past Medical History:  Diagnosis Date  . Anxiety   . CAD (coronary artery disease), native coronary artery 2009   75% LAD, diffuse 75-90% RCA --> Referred for CABG + MAZE;  Cardiolite 12/2013: No ischemia or Infarction  . Cervical neck pain with evidence of disc disease    with need for surgery -- November 2015  . Chronic low back pain    scoliosis & lordosis  . CKD (chronic kidney disease)   . COPD (chronic obstructive pulmonary disease) ([place])   . Dyslipidemia, goal LDL below 70    On Crestor, followed by PCP  . Essential hypertension   . H/O ischemic left MCA stroke 11/18/2014    MRI/MRA of head: Multifocal left MCA infarct.. Also possible left ACA territory infarct -- complete occlusion of left MCA distally at M2 level. Marked left ACA attenuation.  . H/O: pneumonia   . Hypothyroidism   . Ischemic cardiomyopathy 2012   EF ~40-45% by Echo  . Osteoarthritis of back    And neck, hands,spine  . PAD (peripheral artery disease) ([place]) 2007   s/p L Ileac A stent ; most recent Dopplers July 2012: Less than 50% reduction bilaterally. ABI 0.96 on the right 0.88 on left;;;12/27/2011   -ABI right .87 and left ABI .78  ,LEFT CIA and EIA stent normall patency, left CFA,SFA,and popliteal 0-49%; rgt proximal SFA 50-69%,rgt CIA,EIA, and CFA 0-49%  . Persistent atrial fibrillation ([place]) 04/05/2012   Now permanent A. fib s/p MAZE -- recurrence, cardioversion-converted to sinus bradycardia --> Now persistent  . Pulmonary hypertension    Estimated PA pressure on Echo January 2017 = 59 mmHg with dilated IVC, severely dilated RA and moderate TR.  . S/P CABG x 2 2009   LIMA-LAD, SVG-RCA, with AV fistula ligation and Maze procedure  . Stroke ([place])   . TIA (transient ischemic attack) 04/05/2015   Social History   Social History  . Marital status: Widowed    Spouse name: N/A  . Number of children: N/A  . Years of education: N/A   Social History Main Topics  . Smoking status: Former Smoker    Packs/day: 0.50    Types: Cigarettes    Quit date: 01/07/1998  . Smokeless tobacco: Never Used  . Alcohol use No  . Drug use: No  . Sexual activity: Not Asked   Other Topics Concern  . None   Social History Narrative    She is a divorced mother of 1 and one child who died. Her daughter is here with her   today. She has got 4 grandchildren. She is a native of [place] and only has an occasional alcoholic   beverage. She does not get routine exercise mostly because of her spine pain. She did previously smoke but quit many years ago.    Family History  Problem Relation Age of Onset  . Heart  attack Mother     Late 69s  . Stroke Mother   . Stroke Father   . Heart attack Father     46s  . Heart disease Brother 60    Cardiomegaly  . Stroke Sister   . Heart attack Son 45   Scheduled Meds: . diltiazem  30 mg Oral BID  . famotidine (PEPCID) IV  20 mg Intravenous  QHS  . insulin aspart  0-9 Units Subcutaneous Q4H  . levothyroxine  18.75 mcg Intravenous Daily  . meropenem (MERREM) IV  500 mg Intravenous Q12H  . potassium chloride (KCL MULTIRUN) 30 mEq in 265 mL IVPB  30 mEq Intravenous Q3H   Continuous Infusions: . norepinephrine (LEVOPHED) Adult infusion Stopped (05/06/16 1300)   PRN Meds:.sodium chloride, ipratropium-albuterol, oxyCODONE-acetaminophen Medications Prior to Admission:  Prior to Admission medications   Medication Sig Start Date End Date Taking? Authorizing Provider  apixaban (ELIQUIS) 5 MG TABS tablet Take 1 tablet (5 mg total) by mouth 2 (two) times daily. 11/19/14  Yes Geradine Girt, DO  Cholecalciferol (VITAMIN D3) 2000 units TABS Take 1 tablet by mouth daily.   Yes Historical Provider, MD  diltiazem (CARDIZEM CD) 180 MG 24 hr capsule Take 1 capsule (180 mg total) by mouth daily. 12/17/15 05/09/2016 Yes Leonie Man, MD  feeding supplement (BOOST / RESOURCE BREEZE) LIQD Take 1 Container by mouth 3 (three) times daily between meals. 09/23/15  Yes Zada Finders, MD  ferrous sulfate 325 (65 FE) MG tablet Take 1 tablet (325 mg total) by mouth daily with breakfast. 11/24/15  Yes Albertine Patricia, MD  furosemide (LASIX) 20 MG tablet Take 1 tablet (20 mg total) by mouth every other day. Take extra dose if having fluid retention, or  leg edema 11/24/15  Yes Albertine Patricia, MD  Ipratropium-Albuterol (COMBIVENT RESPIMAT) 20-100 MCG/ACT AERS respimat Inhale 1 puff into the lungs every 6 (six) hours as needed for wheezing or shortness of breath. 05/01/16  Yes Barton Dubois, MD  LANOXIN 62.5 MCG TABS TAKE ONE TABLET BY MOUTH DAILY 04/04/16  Yes Leonie Man, MD    levothyroxine (SYNTHROID, LEVOTHROID) 75 MCG tablet Take 0.5 tablets (37.5 mcg total) by mouth daily before breakfast. 01/31/16  Yes Leonie Man, MD  Metoprolol Tartrate 75 MG TABS Take 75 mg by mouth 3 (three) times daily. 01/12/16  Yes Leonie Man, MD  oxyCODONE-acetaminophen (PERCOCET) 7.5-325 MG tablet Take 1 tablet by mouth every 4 (four) hours as needed for moderate pain or severe pain.    Yes Historical Provider, MD  potassium chloride (K-DUR) 10 MEQ tablet Take 1 tablet (10 mEq total) by mouth every other day. 05/01/16  Yes Barton Dubois, MD  psyllium (HYDROCIL/METAMUCIL) 95 % PACK Take 1 packet by mouth every other day.   Yes Historical Provider, MD  rosuvastatin (CRESTOR) 5 MG tablet TAKE 1 TABLET BY MOUTH IN THE EVENING 12/22/15  Yes Leonie Man, MD  saccharomyces boulardii (FLORASTOR) 250 MG capsule Take 1 capsule (250 mg total) by mouth 2 (two) times daily. 05/01/16  Yes Barton Dubois, MD   No Known Allergies Review of Systems  Unable to obtain  Physical Exam  General: Alert, awake, in no acute distress. Confused, thin and chronically ill appearing  HEENT: No bruits Heart: Irregular. No murmur appreciated. Lungs: Diminished air movement, clear Abdomen: Soft, nontender, nondistended, positive bowel sounds.  Ext: No significant edema Skin: Warm and dry.  Vital Signs: BP 136/75   Pulse (!) 111   Temp 97.1 F (36.2 C) (Axillary)   Resp 15   Ht '4\' 11"'$  (1.499 m)   Wt 40.5 kg (89 lb 4.6 oz)   SpO2 95%   BMI 18.03 kg/m  Pain Assessment: 0-10 POSS *See Group Information*: 1-Acceptable,Awake and alert Pain Score: Asleep   SpO2: SpO2: 95 % O2 Device:SpO2: 95 % O2 Flow Rate: .O2 Flow Rate (L/min): 2 L/min  IO:  Intake/output summary:  Intake/Output Summary (Last 24 hours) at 05/07/16 1135 Last data filed at 05/07/16 3546  Gross per 24 hour  Intake            983.8 ml  Output             1325 ml  Net           -341.2 ml    LBM: Last BM Date:  05/07/16 Baseline Weight: Weight: 33.6 kg (74 lb) Most recent weight: Weight: 40.5 kg (89 lb 4.6 oz)     Palliative Assessment/Data:   Flowsheet Rows   Flowsheet Row Most Recent Value  Intake Tab  Referral Department  Critical care  Unit at Time of Referral  ICU  Palliative Care Primary Diagnosis  Sepsis/Infectious Disease  Date Notified  05/05/16  Palliative Care Type  New Palliative care  Reason for referral  Clarify Goals of Care  Date of Admission  05/11/2016  Date first seen by Palliative Care  05/05/16  # of days Palliative referral response time  0 Day(s)  # of days IP prior to Palliative referral  1  Clinical Assessment  Palliative Performance Scale Score  30%  Pain Max last 24 hours  Not able to report  Pain Min Last 24 hours  Not able to report  Psychosocial & Spiritual Assessment  Palliative Care Outcomes  Patient/Family meeting held?  Yes  Who was at the meeting?  Daughter      Time In: 0700 Time Out: 0820 Time Total: 24 Greater than 50%  of this time was spent counseling and coordinating care related to the above assessment and plan.  Signed by: Micheline Rough, MD   Please contact Palliative Medicine Team phone at [phone] for questions and concerns.  For individual provider: See Shea Evans

## 2016-05-07 NOTE — Progress Notes (Signed)
PULMONARY / CRITICAL CARE MEDICINE   Name: Brenda RinksRobina B Glass MRN: [id number] DOB: 07/19/1940    ADMISSION DATE:  05/27/2016  REFERRING MD:  EDP   CHIEF COMPLAINT:  Sepsis   BRIEF SUMMARY:  76 y/o F admitted on 2/1 with reports of altered mental status.  Work up notable for hypotension, hypothermia, INR >10, elevated LFT's, AKI and lactic acid elevation.  PCCM admitted for presumed septic shock (no identified source, recent admit 1/27-1/29 for CAP with d/c on augmentin + O2)  PMHx: CAD s/p CABG (2009), CKD, AF on Eliquis, prior CVA, COPD, pulmonary HTN.  Family states prior to last admit, she was independent of ADL's.     SUBJECTIVE:  Norepi weaned to off Low dose percocet started 2/3 for back pain A bit confused A fib with HR as high as 150's  VITAL SIGNS: BP (!) 102/46   Pulse (!) 115   Temp 97.1 F (36.2 C) (Axillary)   Resp 18   Ht 4\' 11"  (1.499 m)   Wt 40.5 kg (89 lb 4.6 oz)   SpO2 95%   BMI 18.03 kg/m   HEMODYNAMICS:    VENTILATOR SETTINGS:    INTAKE / OUTPUT: I/O last 3 completed shifts: In: 1318.6 [P.O.:180; I.V.:623.6; IV Piggyback:515] Out: 2450 [Urine:2450]  PHYSICAL EXAMINATION:   General: thin woman, a bit stronger  HEENT: scleral icterus, dry OP Neuro: globally weak but a bit stronger this am, confused CV: irreg irreg, 120-150 Resp: distant, no wheeze Abd: RUQ tender, no rebound Ext: thin, no edema Skin: no rash    LABS:  BMET  Recent Labs Lab 05/05/16 0414 05/06/16 0900 05/07/16 0405  NA 133* 136 139  K 5.9* 3.5 2.8*  CL 99* 99* 103  CO2 21* 24 26  BUN 50* 51* 42*  CREATININE 1.91* 1.62* 1.16*  GLUCOSE 121* 137* 116*    Electrolytes  Recent Labs Lab 05/05/16 0414 05/06/16 0900 05/07/16 0405  CALCIUM 6.7* 6.8* 7.4*  MG 2.1 1.9  --   PHOS 5.5* 3.2  --     CBC  Recent Labs Lab 05/05/16 0414 05/06/16 0900 05/07/16 0405  WBC 12.1* 10.7* 8.5  HGB 8.8* 8.5* 8.3*  HCT 30.2* 27.8* 28.6*  PLT 254 134* 91*     Coag's  Recent Labs Lab 02/26/17 1455 05/05/16 0414 05/07/16 0405  APTT 58* 45*  --   INR >10.00* 6.07* 3.16    Sepsis Markers  Recent Labs Lab 02/26/17 1455 02/26/17 1701 02/26/17 2018 05/05/16 0414 05/07/16 0405  LATICACIDVEN  --  3.3* 2.3*  --  1.4  PROCALCITON 1.31  --   --  1.11  --     ABG No results for input(s): PHART, PCO2ART, PO2ART in the last 168 hours.  Liver Enzymes  Recent Labs Lab 02/26/17 1455 05/06/16 0900 05/07/16 0405  AST 1,212* 577* 347*  ALT 1,464* 907* 712*  ALKPHOS 120 117 120  BILITOT 3.5* 4.0* 3.9*  ALBUMIN 3.2* 3.5 3.3*    Cardiac Enzymes  Recent Labs Lab 02/26/17 1455  TROPONINI 0.04*    Glucose  Recent Labs Lab 05/06/16 1204 05/06/16 1626 05/06/16 2031 05/06/16 2328 05/07/16 0413 05/07/16 0807  GLUCAP 132* 103* 136* 125* 115* 115*    Imaging No results found.   STUDIES:  CT head 2/1 >> neg acute  CT Chest/abd/pelvis 1/27 >> Advanced centrilobular emphysema and hyperinflation, consistent with COPD.  Superimposed mild interstitial pulmonary edema.  Moderate in size bilateral simple appearing pleural effusions.Widespread atelectatic changes in the dependent  portions of the lungs. More confluent airspace opacity in the right middle lobe may represent round atelectasis/airspace consolidation or less likely pulmonary mass. Attention on follow-up is recommended.Small amount of free pelvic fluid and generalized mesenteric stranding of the abdomen.  CULTURES: BC x 2 2/1 >> Urine  2/1 >> multiple species UA 2/1 >> negative  Hepatitis Panel 2/1 >> negative   ANTIBIOTICS: vanc  2/1 >> Cefepime  2/1 >> 2/1 Imipenem 2/1 >>  SIGNIFICANT EVENTS: 1/27 - 1/29  Admit for CAP  2/01 Admit [facility], shock MODS, hcap? coagulapthy  LINES/TUBES: Not candidate  DISCUSSION: 76 y/o F with recent discharge from hospital 1/29 for CAP admitted 2/1 with hypotension, hypothermia, INR>10, AKI and elevated LFT's.  Concern for  sepsis with likely HCAP.  DNR but aggressive medical care for now.   ASSESSMENT / PLAN:  PULMONARY Acute on chronic hypoxic respiratory failure - multifactorial r/t PNA, COPD, bilat pleural effusion, AMS  Bilateral Pleural Effusions Suspected HCAP  Pulmonary HTN - PA peak pressure 59 11/2015 COPD  P:   Abx as below DuoNeb ordered prn Check CXR 2/5 Appreciate Palliative care assistance, confirmed DNR/I status  CARDIOVASCULAR Septic shock - possible pulmonary source Pulmonary HTN - most recent PA pressure (august 2017) in setting moderate TR  AFib - on Eliquis  CAD s/p CABG 2009 P:  Norepi off, deferred CVC Off anticoag  On dilt CD and metoprolol at home > will give low dose IV metop now, start short acting dilt  May be able to restart digoxin if renal fxn normalizes 2/5 Will likely need more diuresis when BP will tolerate  RENAL AKI on CKD - baseline Scr ~0.9, improving Hypokalemia Hyperphosphatemia  P:   Follow BMP treat electrolyte abnormalities as indicated > K+ replaced this am   GASTROINTESTINAL Nausea/vomiting, improved Liver dysfunction - unclear etiology. No evidence obstructive etiology on CT. ?gilbert syndrome, congestive hepatopathy.  Improving.  Acute hepatitis panel negative.  Small amt pelvic free fluid - ? abd source contributory to sepsis  P:   Follow coags and LFT OK for PO diet, even given risk for aspiration  HEMATOLOGIC Coagulopathy - r/t liver dysfunction of unclear etiology. INR>10 (sepsis in setting eliquis ( arf) poor clearance) Thrombocytopenia  P:  Follow CBC SCD's in place  INFECTIOUS Sepsis - PNA +/- ?abd source, ? UTI P:   Broad abx day 4/x Follow cx data, note mixed bacteria in urine cx vanco stopped 2/3 Consider repeat Ct abd depending on course, will defer for now  ENDOCRINE Hyperglycemia  AI ruled out - cortisol 71 on admit TSH wnl Hypothermia P:   Follow glucose on BMP  NEUROLOGIC Acute Encephalopathy - likely  multifactorial in the setting of sepsis, respiratory failure, failure to thrive >> improving 2/3 P:  Supportive care     FAMILY  - Updates:  I spoke with pt and daughter today 2/3. Agree with DNR/I status, possible transition to comfort if she is not recovering next 24h or so. Also agree with initiating PO diet, understanding the potential risk of aspiration.    Independent CC time 34 minutes  Levy Pupa, MD, PhD 05/07/2016, 9:22 AM [facility] [phone] or if no answer [phone]

## 2016-05-07 NOTE — Progress Notes (Signed)
[facility]OC[facility] FOR AM LAB REPLACEMENT ONLY  The patient does apply for the [facility] Electrolyte [facility]ment Protocol based on the criteria listed below:   1. Is GFR >/= 40 ml/min? Yes.    Patient's GFR today is 45 2. Is urine output >/= 0.5 ml/kg/hr for the last 6 hours? Yes.   Patient's UOP is  41 ml/kg/hr 3. Is BUN < 60 mg/dL? Yes.    Patient's BUN today is 42 4. Abnormal electrolyte(s):K=2.8 5. Ordered repletion with: 40meq PO x 2 doses 6. If a panic level lab has been reported, has the CCM MD in charge been notified? Yes.  .   Physician:  Dr. Wadie Lesseneterding   McDaniel, TonasketJill Dollar 05/07/2016 5:20 AM

## 2016-05-07 NOTE — Progress Notes (Signed)
Pharmacy IV to PO conversion  The patient is receiving Levothyroxine and Famotidine by the intravenous route.  Based on criteria approved by the Pharmacy and Therapeutics Committee and the Medical Executive Committee, the medication is being converted to the equivalent oral dose form.   No active GI bleeding or impaired absorption  Not s/p esophagectomy  Documented ability to take oral medications for > 24 hr  Plan to continue treatment for at least 1 day  If you have any questions about this conversion, please contact the Pharmacy Department (ext [phone]).  Thank you.  Bernadene Personrew Yaser Harvill, PharmD Pager: [phone] 05/07/2016, 1:18 PM

## 2016-05-07 NOTE — Progress Notes (Signed)
Notified Dr. Delton CoombesByrum of patient with A fib and her HR is 150-170/min.  Dr. Delton CoombesByrum with restart her home medications when he arrives at the hospital this am.  Cont to monitor patient closely.  [employer] Debroah LoopArnold RN

## 2016-05-07 NOTE — Progress Notes (Signed)
eLink Physician-Brief Progress Note Patient Name: Brenda RinksRobina B Glass DOB: 10/25/1940 MRN: [id number]   Date of Service  05/07/2016  HPI/Events of Note  AFIB with RVR. Ventricular rate to 190. BP = 119/59 with MAP = 75. Was on vasopressors yesterday. Already on low dose of Cardizem PO. However, this low dose of Cardizem is not likely to provide rate control.  eICU Interventions  Will order: 1. Continue Cardizem PO. 2. Amiodarone IV load and infusion. 3. BMP and Mg+ level now.      Intervention Category Major Interventions: Arrhythmia - evaluation and management  Sommer,Steven Eugene 05/07/2016, 5:23 PM

## 2016-05-07 NOTE — Progress Notes (Signed)
Patient with HR 160-180's.  Called E link to notify of increased HR.  Dr at E link unable to talk with RN at this time about the patient and her increased HR.

## 2016-05-08 ENCOUNTER — Inpatient Hospital Stay (HOSPITAL_COMMUNITY): Payer: Medicare HMO

## 2016-05-08 DIAGNOSIS — J189 Pneumonia, unspecified organism: Secondary | ICD-10-CM

## 2016-05-08 DIAGNOSIS — I4891 Unspecified atrial fibrillation: Secondary | ICD-10-CM

## 2016-05-08 LAB — PREPARE FRESH FROZEN PLASMA
UNIT DIVISION: 0
UNIT DIVISION: 0
Unit division: 0
Unit division: 0
Unit division: 0

## 2016-05-08 LAB — GLUCOSE, CAPILLARY
GLUCOSE-CAPILLARY: 135 mg/dL — AB (ref 65–99)
Glucose-Capillary: 115 mg/dL — ABNORMAL HIGH (ref 65–99)
Glucose-Capillary: 119 mg/dL — ABNORMAL HIGH (ref 65–99)
Glucose-Capillary: 125 mg/dL — ABNORMAL HIGH (ref 65–99)
Glucose-Capillary: 159 mg/dL — ABNORMAL HIGH (ref 65–99)
Glucose-Capillary: 184 mg/dL — ABNORMAL HIGH (ref 65–99)
Glucose-Capillary: 83 mg/dL (ref 65–99)

## 2016-05-08 LAB — MRSA PCR SCREENING: MRSA BY PCR: NEGATIVE

## 2016-05-08 LAB — BASIC METABOLIC PANEL
ANION GAP: 8 (ref 5–15)
BUN: 31 mg/dL — AB (ref 6–20)
CALCIUM: 8.4 mg/dL — AB (ref 8.9–10.3)
CO2: 28 mmol/L (ref 22–32)
CREATININE: 0.98 mg/dL (ref 0.44–1.00)
Chloride: 106 mmol/L (ref 101–111)
GFR calc Af Amer: >60 mL/min (ref >60)
GFR, EST NON AFRICAN AMERICAN: 55 mL/min — AB (ref >60)
GLUCOSE: 123 mg/dL — AB (ref 65–99)
Potassium: 3.5 mmol/L (ref 3.5–5.1)
Sodium: 142 mmol/L (ref 135–145)

## 2016-05-08 LAB — CBC
HCT: 27.8 % — ABNORMAL LOW (ref 36.0–46.0)
Hemoglobin: 8.1 g/dL — ABNORMAL LOW (ref 12.0–15.0)
MCH: 24.5 pg — ABNORMAL LOW (ref 26.0–34.0)
MCHC: 29.1 g/dL — AB (ref 30.0–36.0)
MCV: 84 fL (ref 78.0–100.0)
PLATELETS: 93 10*3/uL — AB (ref 150–400)
RBC: 3.31 MIL/uL — ABNORMAL LOW (ref 3.87–5.11)
RDW: 20.6 % — AB (ref 11.5–15.5)
WBC: 7.5 10*3/uL (ref 4.0–10.5)

## 2016-05-08 LAB — PROTIME-INR
INR: 1.8
PROTHROMBIN TIME: 21.1 s — AB (ref 11.4–15.2)

## 2016-05-08 LAB — TYPE AND SCREEN
ABO/RH(D): A NEG
ANTIBODY SCREEN: NEGATIVE
Unit division: 0

## 2016-05-08 LAB — MAGNESIUM: Magnesium: 2.1 mg/dL (ref 1.7–2.4)

## 2016-05-08 MED ORDER — METOPROLOL TARTRATE 5 MG/5ML IV SOLN: 2.5000 mg | INTRAVENOUS | Status: DC | PRN

## 2016-05-08 MED ORDER — BOOST / RESOURCE BREEZE PO LIQD
1.0000 | Freq: Three times a day (TID) | ORAL | Status: DC
  Administered 2016-05-08 – 2016-05-09 (×3): 1 via ORAL
  Filled 2016-05-08 (×2): qty 1

## 2016-05-08 MED ORDER — DILTIAZEM HCL 60 MG PO TABS
60.0000 mg | ORAL_TABLET | Freq: Three times a day (TID) | ORAL | Status: DC
  Administered 2016-05-08 – 2016-05-09 (×3): 60 mg via ORAL
  Filled 2016-05-08 (×3): qty 1

## 2016-05-08 MED ORDER — METOPROLOL TARTRATE 25 MG PO TABS
50.0000 mg | ORAL_TABLET | Freq: Two times a day (BID) | ORAL | Status: DC
  Administered 2016-05-08: 50 mg via ORAL
  Filled 2016-05-08 (×2): qty 2

## 2016-05-08 MED ORDER — SODIUM CHLORIDE 0.9 % IV SOLN
1.0000 g | Freq: Two times a day (BID) | INTRAVENOUS | Status: DC
  Administered 2016-05-08 – 2016-05-09 (×3): 1 g via INTRAVENOUS
  Filled 2016-05-08 (×4): qty 1

## 2016-05-08 NOTE — Progress Notes (Signed)
Spoke with E-Link MD regarding pt HR of 66-72 bpm, and soft BPs, last BP 96/42 (62). Pt scheduled to received Lopressor and Cardizem PO at 2200. RN consulted MD regarding staggering administration time, or adding order parameters to Cardizem and Metoprolol. MD advised RN to hold the 2200 dose of Cardizem and Metoprolol. RN will continue to monitor and consult with E-Link MD as needed.

## 2016-05-08 NOTE — Progress Notes (Signed)
eLink Physician-Brief Progress Note Patient Name: Brenda RinksRobina B Glass DOB: 01/08/1941 MRN: [id number]   Date of Service  05/08/2016  HPI/Events of Note  Both lopressor and cardizem ordered without parameters  eICU Interventions  Parameters added     Intervention Category Major Interventions: Arrhythmia - evaluation and management  Maeghan Canny 05/08/2016, 8:27 PM

## 2016-05-08 NOTE — Progress Notes (Signed)
RN called MD for marginal BP 89/51 (62) rechecked manually. No new orders at this time, MD stated he is okay with systolic in 80s. Will continue to monitor the pt closely.

## 2016-05-08 NOTE — Progress Notes (Signed)
Palliative Care Progress Note  Reason for consult: Goals of care in light of presumed septic shock  I met today with patient's daughter, [name], and her friend, Juliann Pulse.  We reviewed her mother's clinical course over the last 48 hours.  She reports talking with Dr. Elsworth Soho this morning.    She reports that her feelings continue to be that she does not want her mother to suffer if she is approaching the end of her life, however, she has been seeing marginal improvements in her mother that she is hopeful are signs of improving.  She does understand the severity as she points out that her mother is not even strong enough to stand on her own at this point. She talked with critical care about continuing therapy for another 24 hours.  We talked again that If her mother is worsening, her ultimate goal will be to get her home with the support of hospice.  Total time spent: 30 minutes Greater than 50%  of this time was spent counseling and coordinating care related to the above assessment and plan.  Micheline Rough, MD [place] Team [phone]

## 2016-05-08 NOTE — Progress Notes (Signed)
Date:  May 08, 2016 Chart reviewed for concurrent status and case management needs. Will continue to follow patient progress. Remains on Iv amiodarone for a.fib. Discharge Planning: following for needs Expected discharge date: [id number] Marcelle SmilingRhonda Davis, BSN, [place]uxleyRN3, [place]CCM   [phone]

## 2016-05-08 NOTE — Progress Notes (Signed)
Pharmacy Antibiotic Note  [name] is a 76 y.o. female admitted on 05/11/2016 with sepsis.  Suspected HCAP +/- abdominal source, ?UTI.  Pharmacy has been consulted for Meropenem dosing.  Today is day #5 meropenem.  Plan: Adjust meropenem to 1g IV q12h for improved SCr.    Height: 4\' 11"  (149.9 cm) Weight: 91 lb 14.9 oz (41.7 kg) IBW/kg (Calculated) : 43.2  Temp (24hrs), Avg:97.3 F (36.3 C), Min:96.1 F (35.6 C), Max:98.2 F (36.8 C)   Recent Labs Lab 07/01/2016 1451  07/01/2016 1701 07/01/2016 1824 07/01/2016 2018 05/05/16 0414 05/06/16 0900 05/07/16 0405 05/07/16 1823 05/08/16 0354  WBC  --   < >  --  10.5  --  12.1* 10.7* 8.5  --  7.5  CREATININE  --   < >  --   --   --  1.91* 1.62* 1.16* 1.14* 0.98  LATICACIDVEN 4.98*  --  3.3*  --  2.3*  --   --  1.4  --   --   < > = values in this interval not displayed.  Estimated Creatinine Clearance: 32.1 mL/min (by C-G formula based on SCr of 0.98 mg/dL).    No Known Allergies  Antimicrobials this admission: 2/1 vanc >> 2/3 2/1 meropenem >>  Dose adjustments this admission:  Microbiology results: 2/1 BCx: ngtd 2/1 UCx: multiple species  1/27 BCx: NGF 2/1 Flu: neg 2/1 Hep panel: neg  Thank you for allowing pharmacy to be a part of this patient's care.  Clance BollRunyon, Dsean Vantol 05/08/2016 10:33 AM

## 2016-05-08 NOTE — Progress Notes (Signed)
PULMONARY / CRITICAL CARE MEDICINE   Name: [name] MRN: [id number] DOB: [birth year]    ADMISSION DATE:  May 31, 2016  REFERRING MD:  EDP   CHIEF COMPLAINT:  Sepsis   BRIEF SUMMARY:  76 y/o F admitted on 2/1 with reports of altered mental status.  Work up notable for hypotension, hypothermia, INR >10, elevated LFT's, AKI and lactic acid elevation.  PCCM admitted for presumed septic shock (no identified source, recent admit 1/27-1/29 for CAP with d/c on augmentin + O2)  PMHx: CAD s/p CABG (2009), CKD, AF on Eliquis, prior CVA, COPD, pulmonary HTN.  Family states prior to last admit, she was independent of ADL's.     SUBJECTIVE:  Off pressors Sleepy but easily aroused,confused A fib with HR as high as 120's  VITAL SIGNS: BP 108/72   Pulse (!) 115   Temp (!) 96.1 F (35.6 C) (Axillary)   Resp 11   Ht 4\' 11"  (1.499 m)   Wt 91 lb 14.9 oz (41.7 kg)   SpO2 100%   BMI 18.57 kg/m   HEMODYNAMICS:    VENTILATOR SETTINGS:    INTAKE / OUTPUT: I/O last 3 completed shifts: In: 1508.3 [P.O.:240; I.V.:538.3; IV Piggyback:730] Out: 1935 [Urine:1810; Other:125]  PHYSICAL EXAMINATION:   General: thin woman, frail HEENT: scleral icterus, dry OP Neuro: sleepy, easily aroused,, confused CV: irreg irreg, 120s Resp: distant, no wheeze Abd: RUQ tender, no rebound Ext: thin, no edema Skin: no rash    LABS:  BMET  Recent Labs Lab 05/07/16 0405 05/07/16 1823 05/08/16 0354  NA 139 142 142  K 2.8* 3.8 3.5  CL 103 105 106  CO2 26 28 28   BUN 42* 33* 31*  CREATININE 1.16* 1.14* 0.98  GLUCOSE 116* 185* 123*    Electrolytes  Recent Labs Lab 05/05/16 0414 05/06/16 0900 05/07/16 0405 05/07/16 1823 05/08/16 0354  CALCIUM 6.7* 6.8* 7.4* 8.0* 8.4*  MG 2.1 1.9  --  1.9 2.1  PHOS 5.5* 3.2  --   --   --     CBC  Recent Labs Lab 05/06/16 0900 05/07/16 0405 05/08/16 0354  WBC 10.7* 8.5 7.5  HGB 8.5* 8.3* 8.1*  HCT 27.8* 28.6* 27.8*  PLT 134* 91* 93*     Coag's  Recent Labs Lab 31-May-2016 1455 05/05/16 0414 05/07/16 0405  APTT 58* 45*  --   INR >10.00* 6.07* 3.16    Sepsis Markers  Recent Labs Lab 31-May-2016 1455 May 31, 2016 1701 05-31-2016 2018 05/05/16 0414 05/07/16 0405  LATICACIDVEN  --  3.3* 2.3*  --  1.4  PROCALCITON 1.31  --   --  1.11  --     ABG No results for input(s): PHART, PCO2ART, PO2ART in the last 168 hours.  Liver Enzymes  Recent Labs Lab 05-31-2016 1455 05/06/16 0900 05/07/16 0405  AST 1,212* 577* 347*  ALT 1,464* 907* 712*  ALKPHOS 120 117 120  BILITOT 3.5* 4.0* 3.9*  ALBUMIN 3.2* 3.5 3.3*    Cardiac Enzymes  Recent Labs Lab 05/31/2016 1455  TROPONINI 0.04*    Glucose  Recent Labs Lab 05/07/16 1638 05/07/16 2013 05/08/16 0010 05/08/16 0341 05/08/16 0729 05/08/16 1109  GLUCAP 117* 174* 119* 115* 135* 184*    Imaging Dg Chest Port 1 View  Result Date: 05/08/2016 CLINICAL DATA:  Acute respiratory failure EXAM: PORTABLE CHEST 1 VIEW COMPARISON:  05/06/2016, 05/05/2016 FINDINGS: Partially visualized cervical spine hardware. Postsurgical changes of the mediastinum. Left-sided single lead pacing device with lead over the right ventricle as before.  Moderate left pleural effusion. Small moderate right pleural effusion. Hazy edema or infiltrates bilaterally, increased compared to prior. Stable cardiomegaly. Atherosclerosis. No pneumothorax. IMPRESSION: 1. Bilateral pleural effusions, moderate on the left and small moderate on the right. 2. Cardiomegaly with central vascular congestion. Increased hazy bilateral pulmonary edema an or infiltrates since the prior study. Electronically Signed   By: Jasmine Pang M.D.   On: 05/08/2016 03:52     STUDIES:  CT head 2/1 >> neg acute  CT Chest/abd/pelvis 1/27 >> Advanced centrilobular emphysema and hyperinflation, consistent with COPD.  Superimposed mild interstitial pulmonary edema.  Moderate in size bilateral simple appearing pleural  effusions.Widespread atelectatic changes in the dependent portions of the lungs. More confluent airspace opacity in the right middle lobe may represent round atelectasis/airspace consolidation or less likely pulmonary mass. Attention on follow-up is recommended.Small amount of free pelvic fluid and generalized mesenteric stranding of the abdomen.  CULTURES: BC x 2 2/1 >>ng Urine  2/1 >> multiple species UA 2/1 >> negative  Hepatitis Panel 2/1 >> negative   ANTIBIOTICS: vanc  2/1 >>2/5 Cefepime  2/1 >> 2/1 Imipenem 2/1 >>  SIGNIFICANT EVENTS: 1/27 - 1/29  Admit for CAP  2/01 Admit WL, shock MODS, hcap? coagulapthy 2/4 amio gtt for RVR  LINES/TUBES:   DISCUSSION: 76 y/o F with recent discharge from hospital 1/29 for CAP admitted 2/1 with hypotension, hypothermia, INR>10, AKI and elevated LFT's.  Concern for sepsis with likely HCAP.  DNR but aggressive medical care for now.   ASSESSMENT / PLAN:  PULMONARY Acute on chronic hypoxic respiratory failure - multifactorial r/t PNA, COPD, bilat pleural effusion, AMS  Bilateral Pleural Effusions Suspected HCAP  Pulmonary HTN - PA peak pressure 59 11/2015 COPD  P:   Abx as below DuoNeb ordered prn Appreciate Palliative care assistance, confirmed DNR/I status  CARDIOVASCULAR Septic shock - possible pulmonary source Pulmonary HTN - most recent PA pressure (august 2017) in setting moderate TR  AFib - on Eliquis  CAD s/p CABG 2009 P:  Norepi off Off anticoag  On dilt CD and metoprolol at home > titrate metop & dilt back on - restart digoxin    RENAL AKI on CKD - baseline Scr ~0.9, improving Hypokalemia Hyperphosphatemia  P:   Follow BMP treat electrolyte abnormalities as indicated   GASTROINTESTINAL Nausea/vomiting, improved Liver dysfunction - unclear etiology. No evidence obstructive etiology on CT. ?gilbert syndrome, congestive hepatopathy.  Improving.  Acute hepatitis panel negative.  Small amt pelvic free  fluid - ? abd source contributory to sepsis  P:   Follow coags and LFT OK for PO diet, even given risk for aspiration  HEMATOLOGIC Coagulopathy - r/t liver dysfunction of unclear etiology. INR>10 (sepsis in setting eliquis ( arf) poor clearance) Thrombocytopenia  P:  Follow CBC SCD's in place  INFECTIOUS Sepsis - doubt HCAP , no other source found P:   Broad abx day 5/7 vanco stopped 2/3   ENDOCRINE Hyperglycemia  AI ruled out - cortisol 71 on admit TSH wnl Hypothermia P:   Follow glucose on BMP  NEUROLOGIC Acute Encephalopathy - likely multifactorial in the setting of sepsis, respiratory failure, failure to thrive >> improving 2/3 P:  Supportive care     FAMILY  - Updates:  daughter 34/5 Agree with DNR/I status, she has recovered but not thriving Will control AF -RVR better & see if mental status improves enough to start PT , otherwise dc with hospice  TO SDU & [facility] 2/6 Independent CC time 31 minutes  Cyril Mourningakesh Alva MD. Tonny BollmanFCCP. [facility] Pager [phone] If no response call [phone]   05/08/2016    05/08/2016, 11:24 AM

## 2016-05-09 DIAGNOSIS — R791 Abnormal coagulation profile: Secondary | ICD-10-CM

## 2016-05-09 DIAGNOSIS — J90 Pleural effusion, not elsewhere classified: Secondary | ICD-10-CM

## 2016-05-09 LAB — CULTURE, BLOOD (ROUTINE X 2)
CULTURE: NO GROWTH
Culture: NO GROWTH

## 2016-05-09 LAB — CBC
HCT: 26.5 % — ABNORMAL LOW (ref 36.0–46.0)
Hemoglobin: 7.6 g/dL — ABNORMAL LOW (ref 12.0–15.0)
MCH: 24.7 pg — AB (ref 26.0–34.0)
MCHC: 28.7 g/dL — ABNORMAL LOW (ref 30.0–36.0)
MCV: 86 fL (ref 78.0–100.0)
PLATELETS: 95 10*3/uL — AB (ref 150–400)
RBC: 3.08 MIL/uL — AB (ref 3.87–5.11)
RDW: 20.8 % — AB (ref 11.5–15.5)
WBC: 7.7 10*3/uL (ref 4.0–10.5)

## 2016-05-09 LAB — BASIC METABOLIC PANEL
Anion gap: 6 (ref 5–15)
BUN: 29 mg/dL — AB (ref 6–20)
CALCIUM: 8.4 mg/dL — AB (ref 8.9–10.3)
CO2: 31 mmol/L (ref 22–32)
Chloride: 104 mmol/L (ref 101–111)
Creatinine, Ser: 1.04 mg/dL — ABNORMAL HIGH (ref 0.44–1.00)
GFR calc Af Amer: 59 mL/min — ABNORMAL LOW (ref >60)
GFR, EST NON AFRICAN AMERICAN: 51 mL/min — AB (ref >60)
Glucose, Bld: 137 mg/dL — ABNORMAL HIGH (ref 65–99)
POTASSIUM: 3.5 mmol/L (ref 3.5–5.1)
SODIUM: 141 mmol/L (ref 135–145)

## 2016-05-09 LAB — PHOSPHORUS: Phosphorus: 2.5 mg/dL (ref 2.5–4.6)

## 2016-05-09 LAB — GLUCOSE, CAPILLARY
GLUCOSE-CAPILLARY: 102 mg/dL — AB (ref 65–99)
GLUCOSE-CAPILLARY: 127 mg/dL — AB (ref 65–99)
GLUCOSE-CAPILLARY: 149 mg/dL — AB (ref 65–99)
GLUCOSE-CAPILLARY: 124 mg/dL — AB (ref 65–99)
Glucose-Capillary: 141 mg/dL — ABNORMAL HIGH (ref 65–99)
Glucose-Capillary: 90 mg/dL (ref 65–99)

## 2016-05-09 LAB — MAGNESIUM: Magnesium: 1.9 mg/dL (ref 1.7–2.4)

## 2016-05-09 LAB — HEPARIN LEVEL (UNFRACTIONATED): Heparin Unfractionated: 1.94 IU/mL — ABNORMAL HIGH (ref 0.30–0.70)

## 2016-05-09 LAB — APTT
APTT: 39 s — AB (ref 24–36)
aPTT: 89 seconds — ABNORMAL HIGH (ref 24–36)

## 2016-05-09 LAB — HEPARIN ANTI-XA: HEPARIN LMW: 1.66 [IU]/mL

## 2016-05-09 MED ORDER — METOPROLOL TARTRATE 25 MG PO TABS
75.0000 mg | ORAL_TABLET | Freq: Two times a day (BID) | ORAL | Status: DC
  Filled 2016-05-09: qty 3

## 2016-05-09 MED ORDER — HEPARIN (PORCINE) IN NACL 100-0.45 UNIT/ML-% IJ SOLN
600.0000 [IU]/h | INTRAMUSCULAR | Status: DC
  Administered 2016-05-09: 600 [IU]/h via INTRAVENOUS
  Filled 2016-05-09: qty 250

## 2016-05-09 NOTE — Progress Notes (Signed)
eLink Physician-Brief Progress Note Patient Name: Brenda RinksRobina B Glass DOB: 06/23/1940 MRN: [id number]   Date of Service  05/09/2016  HPI/Events of Note  CAF, marginal BP, just received diltiazem. Also scheduled for metoprolol. RN reluctant to give it due to low BP  eICU Interventions  DC scheduled metoprolol     Intervention Category Intermediate Interventions: Arrhythmia - evaluation and management;Hypotension - evaluation and management  Merwyn KatosDavid B Simonds 05/09/2016, 10:37 PM

## 2016-05-09 NOTE — Progress Notes (Signed)
PROGRESS NOTE    [name]  ZOX:[id number] DOB: [birth year] DOA: 05-19-16 PCP: [name], MD   Chief Complaint  Patient presents with  . Weakness    Brief Narrative:  HPI on 05/19/16 by Dr. Rory Percy  [phone] female with multiple medical problems including CAD s/p CABG 2009, CKD, previous stroke, AFib on eliquis, Pulmonary HTN with recent admission 1/27-1/29 for sepsis/CAP, elevated LFT's, n/v/d.  She was d/c on augmentin and supplemental O2.  She returned 2/1 with weakness, AMS.   In ER had significant hypotension, lactic acid 5, poor mental status, coagulopathy with INR>10, significantly elevated LFT's, AKI.  PCCM consulted for ICU admit for presumed septic shock.  Assessment & Plan   Acute on chronic hypoxemic respiratory failure -Multifactorial secondary to pneumonia, COPD, bilateral pleural effusions -Continue nebs, treatment for pneumonia and COPD -Continue supplemental oxygen as needed  Septic shock secondary to Suspected pneumonia -Sepsis etiology appears to be improving -Continue merrum  Atrial fibrillation -Patient did require amiodarone drip -Continue cardizem and metoprolol  -CHADSVASC at least 20 (age, gender, HTN, h/o CVA) -platelets 95 -plan is to restart Eliquis  -For now, will place on Heparin  AKI on chronic kidney disease, stage III -Baseline creatinine approximately 0.9 -Upon admission, creatinine 1.94, currently 1.04 -Continue to monitor BMP  Bilateral pleural effusions -Noted on chest x-ray, moderate on the left and small moderate on the right. -Likely secondary to IV fluids  Pulmonary hypertension -PA peak pressure 59  Acute encephalopathy -Secondary to multifactorial causes including pneumonia, shock, hypoxia -Waxing and waning  Hypokalemia/hypophosphatemia -Resolved -Continue to monitor   Coagulopathy -Unclear etiology, possibly sepsis vs liver etiology  -Patient was on Eliquis upon admission, INR >10  Normocytic Anemia, chronic    -Possibly dilutional as patient presented with septic shock and received fluids -hemoglobin has been between 7 and 8 during hospitalization, however 2017 Baseline appears to be 10-11 -Continue to monitor CBC  Thrombocytopenia -Platelet currently 95 -Possibly due to sepsis -Continue to monitor CBC  Hyperglycemia -Continue insulin fine scale CBG monitoring -no history of diabetes  Small amount of pelvic free fluid -question abdominal source with sepsis  Elevated LFTs -Trending downward -hepatitis panel negative -Continue to monitor  -Will obtain Abd [place]  Goals of care -Palliative care consulted and appreciated  Deconditioning -Will consult PT and OT  DVT Prophylaxis  Will start heparin  Code Status: DNR  Family Communication: Daughter at bedside  Disposition Plan: Admitted. Continue stepdown.  Consultants PCCM  Procedures  None  Antibiotics   Anti-infectives    Start     Dose/Rate Route Frequency Ordered Stop   05/08/16 2200  meropenem (MERREM) 1 g in sodium chloride 0.9 % 100 mL IVPB     1 g 200 mL/hr over 30 Minutes Intravenous Every 12 hours 05/08/16 1029     05/06/16 1600  vancomycin (VANCOCIN) 500 mg in sodium chloride 0.9 % 100 mL IVPB  Status:  Discontinued     500 mg 100 mL/hr over 60 Minutes Intravenous Every 48 hours 05-19-16 1659 05/06/16 0952   05/05/16 1600  vancomycin (VANCOCIN) 500 mg in sodium chloride 0.9 % 100 mL IVPB  Status:  Discontinued     500 mg 100 mL/hr over 60 Minutes Intravenous Every 24 hours 05-19-16 1526 19-May-2016 1659   05/05/16 1500  ceFEPIme (MAXIPIME) 1 g in dextrose 5 % 50 mL IVPB  Status:  Discontinued     1 g 100 mL/hr over 30 Minutes Intravenous Every 24 hours 05-19-2016 1526 May 19, 2016  1649   05/15/2016 2200  meropenem (MERREM) 500 mg in sodium chloride 0.9 % 50 mL IVPB  Status:  Discontinued     500 mg 100 mL/hr over 30 Minutes Intravenous Every 12 hours 05/14/2016 1659 05/08/16 1029   05/11/2016 1700  metroNIDAZOLE (FLAGYL)  IVPB 250 mg  Status:  Discontinued     250 mg 50 mL/hr over 60 Minutes Intravenous Every 8 hours 05/30/2016 1645 05/09/2016 1649   05/08/2016 1530  vancomycin (VANCOCIN) 500 mg in sodium chloride 0.9 % 100 mL IVPB     500 mg 100 mL/hr over 60 Minutes Intravenous To Emergency Dept 05/09/2016 1526 05/29/2016 1650   05/27/2016 1515  ceFEPIme (MAXIPIME) 2 g in dextrose 5 % 50 mL IVPB     2 g 100 mL/hr over 30 Minutes Intravenous  Once 05/26/2016 1509 05/30/2016 1548      Subjective:   [name] seen and examined today.  Patient has no complaints this morning, states she is tired. Daughter at bedside.   Objective:   Vitals:   05/09/16 0500 05/09/16 0600 05/09/16 0800 05/09/16 0900  BP: (!) 102/58 (!) 94/52 (!) 106/51 107/80  Pulse: 72 82 96 (!) 117  Resp: 17 13 14  (!) 21  Temp:   97.2 F (36.2 C)   TempSrc:   Axillary   SpO2: 99% 99% 98% 94%  Weight:      Height:        Intake/Output Summary (Last 24 hours) at 05/09/16 1041 Last data filed at 05/09/16 0902  Gross per 24 hour  Intake            451.8 ml  Output              560 ml  Net           -108.2 ml   Filed Weights   05/07/16 0500 05/08/16 0500 05/09/16 0200  Weight: 40.5 kg (89 lb 4.6 oz) 41.7 kg (91 lb 14.9 oz) 41.7 kg (91 lb 14.9 oz)    Exam  General: Well developed, thin, chronically-ill appearing  HEENT: NCAT, scleral icterus, mucous membranes dry.   Cardiovascular: S1 S2 auscultated, Irregularly irregular  Respiratory: Clear to auscultation bilaterally with equal chest rise  Abdomen: Soft, RUQ TTP, nondistended, + bowel sounds  Extremities: warm dry without cyanosis clubbing or edema  Neuro: Awake and alert. Cannot fully assess orientation at this time.  Does follow commands.    Data Reviewed: I have personally reviewed following labs and imaging studies  CBC:  Recent Labs Lab 05/17/2016 1455  05/05/16 0414 05/06/16 0900 05/07/16 0405 05/08/16 0354 05/09/16 0343  WBC 10.0  < > 12.1* 10.7* 8.5 7.5 7.7   NEUTROABS 8.8*  --   --   --   --   --   --   HGB 7.5*  < > 8.8* 8.5* 8.3* 8.1* 7.6*  HCT 27.3*  < > 30.2* 27.8* 28.6* 27.8* 26.5*  MCV 82.5  < > 85.8 81.8 81.0 84.0 86.0  PLT 300  < > 254 134* 91* 93* 95*  < > = values in this interval not displayed. Basic Metabolic Panel:  Recent Labs Lab 05/05/16 0414 05/06/16 0900 05/07/16 0405 05/07/16 1823 05/08/16 0354 05/09/16 0343  NA 133* 136 139 142 142 141  K 5.9* 3.5 2.8* 3.8 3.5 3.5  CL 99* 99* 103 105 106 104  CO2 21* 24 26 28 28 31   GLUCOSE 121* 137* 116* 185* 123* 137*  BUN 50* 51* 42*  33* 31* 29*  CREATININE 1.91* 1.62* 1.16* 1.14* 0.98 1.04*  CALCIUM 6.7* 6.8* 7.4* 8.0* 8.4* 8.4*  MG 2.1 1.9  --  1.9 2.1 1.9  PHOS 5.5* 3.2  --   --   --  2.5   GFR: Estimated Creatinine Clearance: 30.3 mL/min (by C-G formula based on SCr of 1.04 mg/dL (H)). Liver Function Tests:  Recent Labs Lab 06/02/16 1455 05/06/16 0900 05/07/16 0405  AST 1,212* 577* 347*  ALT 1,464* 907* 712*  ALKPHOS 120 117 120  BILITOT 3.5* 4.0* 3.9*  PROT 5.4* 6.0* 5.6*  ALBUMIN 3.2* 3.5 3.3*    Recent Labs Lab 2016/06/02 1455  LIPASE 114*  AMYLASE 156*    Recent Labs Lab 05/05/16 1201  AMMONIA 32   Coagulation Profile:  Recent Labs Lab 02-Jun-2016 1455 05/05/16 0414 05/07/16 0405 05/08/16 1321  INR >10.00* 6.07* 3.16 1.80   Cardiac Enzymes:  Recent Labs Lab 06/02/2016 1455  TROPONINI 0.04*   BNP (last 3 results) No results for input(s): PROBNP in the last 8760 hours. HbA1C: No results for input(s): HGBA1C in the last 72 hours. CBG:  Recent Labs Lab 05/08/16 1508 05/08/16 1928 05/08/16 2304 05/09/16 0306 05/09/16 0756  GLUCAP 159* 83 125* 141* 90   Lipid Profile: No results for input(s): CHOL, HDL, LDLCALC, TRIG, CHOLHDL, LDLDIRECT in the last 72 hours. Thyroid Function Tests: No results for input(s): TSH, T4TOTAL, FREET4, T3FREE, THYROIDAB in the last 72 hours. Anemia Panel: No results for input(s): VITAMINB12, FOLATE,  FERRITIN, TIBC, IRON, RETICCTPCT in the last 72 hours. Urine analysis:    Component Value Date/Time   COLORURINE AMBER (A) 06/02/16 1701   APPEARANCEUR HAZY (A) 02-Jun-2016 1701   LABSPEC 1.018 2016/06/02 1701   PHURINE 5.0 06-02-16 1701   GLUCOSEU NEGATIVE 06/02/16 1701   HGBUR NEGATIVE June 02, 2016 1701   BILIRUBINUR NEGATIVE Jun 02, 2016 1701   KETONESUR 5 (A) 06-02-2016 1701   PROTEINUR 100 (A) 06-02-16 1701   UROBILINOGEN 0.2 11/03/2014 2115   NITRITE NEGATIVE June 02, 2016 1701   LEUKOCYTESUR NEGATIVE Jun 02, 2016 1701   Sepsis Labs: @LABRCNTIP (procalcitonin:4,lacticidven:4)  ) Recent Results (from the past 240 hour(s))  Culture, blood (routine x 2) Call MD if unable to obtain prior to antibiotics being given     Status: None   Collection Time: 04/29/16  8:49 PM  Result Value Ref Range Status   Specimen Description BLOOD RIGHT ANTECUBITAL  Final   Special Requests IN PEDIATRIC BOTTLE 2CC  Final   Culture   Final    NO GROWTH 5 DAYS Performed at [facility], [address], [place], [place] [address]    Report Status 05/05/2016 FINAL  Final  Culture, blood (routine x 2) Call MD if unable to obtain prior to antibiotics being given     Status: None   Collection Time: 04/29/16  8:49 PM  Result Value Ref Range Status   Specimen Description BLOOD RIGHT HAND  Final   Special Requests IN PEDIATRIC BOTTLE 3CC  Final   Culture   Final    NO GROWTH 5 DAYS Performed at [facility], [address], [place], [place] [address]    Report Status 05/05/2016 FINAL  Final  Urine culture     Status: Abnormal   Collection Time: 02-Jun-2016  2:22 PM  Result Value Ref Range Status   Specimen Description URINE, CLEAN CATCH  Final   Special Requests NONE  Final   Culture MULTIPLE SPECIES PRESENT, SUGGEST RECOLLECTION (A)  Final   Report  Status 05/06/2016 FINAL  Final  Blood Culture (routine x 2)     Status: None (Preliminary result)   Collection Time: 05/05/2016  2:55 PM  Result  Value Ref Range Status   Specimen Description BLOOD LEFT ANTECUBITAL  Final   Special Requests AEROBIC BOTTLE ONLY 5CC  Final   Culture   Final    NO GROWTH 4 DAYS Performed at [facility], [address] [place], [place] [address]    Report Status PENDING  Incomplete  Blood Culture (routine x 2)     Status: None (Preliminary result)   Collection Time: 05/30/2016  2:58 PM  Result Value Ref Range Status   Specimen Description BLOOD RIGHT ANTECUBITAL  Final   Special Requests BOTTLES DRAWN AEROBIC AND ANAEROBIC 5CC  Final   Culture   Final    NO GROWTH 4 DAYS Performed at [facility], [address] [place], [place] [address]    Report Status PENDING  Incomplete  MRSA PCR Screening     Status: None   Collection Time: 05/08/16 10:53 AM  Result Value Ref Range Status   MRSA by PCR NEGATIVE NEGATIVE Final    Comment:        The GeneXpert MRSA Assay (FDA approved for NASAL specimens only), is one component of a comprehensive MRSA colonization surveillance program. It is not intended to diagnose MRSA infection nor to guide or monitor treatment for MRSA infections.       Radiology Studies: Dg Chest Port 1 View  Result Date: 05/08/2016 CLINICAL DATA:  Acute respiratory failure EXAM: PORTABLE CHEST 1 VIEW COMPARISON:  05/06/2016, 05/05/2016 FINDINGS: Partially visualized cervical spine hardware. Postsurgical changes of the mediastinum. Left-sided single lead pacing device with lead over the right ventricle as before. Moderate left pleural effusion. Small moderate right pleural effusion. Hazy edema or infiltrates bilaterally, increased compared to prior. Stable cardiomegaly. Atherosclerosis. No pneumothorax. IMPRESSION: 1. Bilateral pleural effusions, moderate on the left and small moderate on the right. 2. Cardiomegaly with central vascular congestion. Increased hazy bilateral pulmonary edema an or infiltrates since the prior study. Electronically Signed   By: Jasmine PangKim  Fujinaga  M.D.   On: 05/08/2016 03:52     Scheduled Meds: . diltiazem  60 mg Oral TID  . famotidine  20 mg Oral QHS  . feeding supplement  1 Container Oral TID BM  . insulin aspart  0-9 Units Subcutaneous Q4H  . levothyroxine  37.5 mcg Oral QAC breakfast  . meropenem (MERREM) IV  1 g Intravenous Q12H  . metoprolol tartrate  75 mg Oral BID   Continuous Infusions:   LOS: 5 days   Time Spent in minutes   30 minutes  Thayne Cindric D.O. on 05/09/2016 at 10:41 AM  Between 7am to 7pm - Pager - [phone]  After 7pm go to [email or web address] - password [email or web address]  And look for the night coverage person covering for me after hours  [facility] Office  [phone]

## 2016-05-09 NOTE — Progress Notes (Signed)
Patient is resting in bed, she appears chronically ill, she is in no distress Daughter is not currently at the bedside.  BP 107/72   Pulse 93   Temp 97.6 F (36.4 C) (Oral)   Resp 19   Ht 4\' 11"  (1.499 m)   Wt 41.7 kg (91 lb 14.9 oz)   SpO2 95%   BMI 18.57 kg/m  Chronically ill appearing Irregular Equal chest rise, no acute dyspnea noted.  NAD No edema  76 yo admitted with acute on chronic hypoxic resp failure, sepsis, suspected PNA, A fib, stage III CKD, bilateral pleural effusions.  From a palliative perspective, a time limited trial of current therapies is going on, comfort care/hospice has been discussed with daughter at the time of initial consult and also in follow up.  We will continue to follow, I will contact the daughter in am, she is not currently at the bedside.  15 minutes spent.  Rosalin HawkingZeba Trebor Galdamez MD [phone]  [facility] team

## 2016-05-09 NOTE — Progress Notes (Signed)
ANTICOAGULATION CONSULT NOTE - Initial Consult  Pharmacy Consult for Heparin Indication: atrial fibrillation  No Known Allergies  Patient Measurements: Height: 4\' 11"  (149.9 cm) Weight: 91 lb 14.9 oz (41.7 kg) IBW/kg (Calculated) : 43.2   Vital Signs: Temp: 97.2 F (36.2 C) (02/06 0800) Temp Source: Axillary (02/06 0800) BP: 107/80 (02/06 0900) Pulse Rate: 117 (02/06 0900)  Labs:  Recent Labs  05/07/16 0405 05/07/16 1823 05/08/16 0354 05/08/16 1321 05/09/16 0343  HGB 8.3*  --  8.1*  --  7.6*  HCT 28.6*  --  27.8*  --  26.5*  PLT 91*  --  93*  --  95*  LABPROT 33.2*  --   --  21.1*  --   INR 3.16  --   --  1.80  --   CREATININE 1.16* 1.14* 0.98  --  1.04*    Estimated Creatinine Clearance: 30.3 mL/min (by C-G formula based on SCr of 1.04 mg/dL (H)).   Medical History: Past Medical History:  Diagnosis Date  . Anxiety   . CAD (coronary artery disease), native coronary artery 2009   75% LAD, diffuse 75-90% RCA --> Referred for CABG + MAZE;  Cardiolite 12/2013: No ischemia or Infarction  . Cervical neck pain with evidence of disc disease    with need for surgery -- November 2015  . Chronic low back pain    scoliosis & lordosis  . CKD (chronic kidney disease)   . COPD (chronic obstructive pulmonary disease) (HCC)   . Dyslipidemia, goal LDL below 70    On Crestor, followed by PCP  . Essential hypertension   . H/O ischemic left MCA stroke 11/18/2014   MRI/MRA of head: Multifocal left MCA infarct.. Also possible left ACA territory infarct -- complete occlusion of left MCA distally at M2 level. Marked left ACA attenuation.  . H/O: pneumonia   . Hypothyroidism   . Ischemic cardiomyopathy 2012   EF ~40-45% by Echo  . Osteoarthritis of back    And neck, hands,spine  . PAD (peripheral artery disease) (HCC) 2007   s/p L Ileac A stent ; most recent Dopplers July 2012: Less than 50% reduction bilaterally. ABI 0.96 on the right 0.88 on left;;;12/27/2011   -ABI right .87  and left ABI .78  ,LEFT CIA and EIA stent normall patency, left CFA,SFA,and popliteal 0-49%; rgt proximal SFA 50-69%,rgt CIA,EIA, and CFA 0-49%  . Persistent atrial fibrillation (HCC) 04/05/2012   Now permanent A. fib s/p MAZE -- recurrence, cardioversion-converted to sinus bradycardia --> Now persistent  . Pulmonary hypertension    Estimated PA pressure on Echo January 2017 = 59 mmHg with dilated IVC, severely dilated RA and moderate TR.  . S/P CABG x 2 2009   LIMA-LAD, SVG-RCA, with AV fistula ligation and Maze procedure  . Stroke (HCC)   . TIA (transient ischemic attack) 04/05/2015    Medications:  Scheduled:  . diltiazem  60 mg Oral TID  . famotidine  20 mg Oral QHS  . feeding supplement  1 Container Oral TID BM  . insulin aspart  0-9 Units Subcutaneous Q4H  . levothyroxine  37.5 mcg Oral QAC breakfast  . meropenem (MERREM) IV  1 g Intravenous Q12H  . metoprolol tartrate  75 mg Oral BID   Infusions:  . heparin     PRN: sodium chloride, ipratropium-albuterol, metoprolol, oxyCODONE-acetaminophen  Assessment: 76 y/o F with PMH including atrial fibrillation, stroke, CKD, CAD s/p CABG, ischemic cardiomyopathy, who was admitted with septic shock.  PTA was on  Eliquis 5 mg BID (last dose taken 2/1); coagulopathy noted on admission with INR > 10 and elevated LFTs.  Eliquis was held; vitamin K 10 mg IV x 1 was also given since LFTs were abnormal.  Coagulopathy now resolving w/INR < 2.  Apparent plan is to resume Eliquis when medically stable, but for now, received orders from attending MD to begin IV heparin. Thrombocytopenia noted but has been improving over the past 3 days - reviewed with attending MD.   Goal Range:  APTT 66 - 102 sec (follow until confirmation that  APTT and heparin [anti-Xa] level are correlating in setting of recent DOAC therapy) Heparin level 0.3-0.7 units/ml  Monitor platelets by anticoagulation protocol: Yes   Plan:  APTT and anti-Xa level stat Begin heparin IV  infusion at 600 units/hr Check APTT and heparin level at 8 pm. Daily heparin level Daily APTT until correlation with heparin level confirmed Daily CBC Follow clinical course, anticipate transitioning back to Eliquis when medically stable.  Elie Goodyandy Hoa Briggs, PharmD, BCPS Pager: [phone] 05/09/2016  11:36 AM

## 2016-05-09 NOTE — Progress Notes (Signed)
PULMONARY / CRITICAL CARE MEDICINE   Name: [name] MRN: [id number] DOB: [birth year]    ADMISSION DATE:  2016/05/17  REFERRING MD:  EDP   CHIEF COMPLAINT:  Sepsis   BRIEF SUMMARY:  76 y/o F admitted on 2/1 with reports of altered mental status.  Work up notable for hypotension, hypothermia, INR >10, elevated LFT's, AKI and lactic acid elevation.  PCCM admitted for presumed septic shock (no identified source, recent admit 1/27-1/29 for CAP with d/c on augmentin + O2)  PMHx: CAD s/p CABG (2009), CKD, AF on Eliquis, prior CVA, COPD, pulmonary HTN.  Family states prior to last admit, she was independent of ADL's.     SUBJECTIVE:  A fib with HR better controlled Off amio gtt Sleepy but easily aroused,confused  VITAL SIGNS: BP 107/80   Pulse (!) 117   Temp 97.2 F (36.2 C) (Axillary)   Resp (!) 21   Ht 4\' 11"  (1.499 m)   Wt 91 lb 14.9 oz (41.7 kg)   SpO2 94%   BMI 18.57 kg/m   HEMODYNAMICS:    VENTILATOR SETTINGS:    INTAKE / OUTPUT: I/O last 3 completed shifts: In: 997 [P.O.:405; I.V.:392; IV Piggyback:200] Out: 1095 [Urine:1095]  PHYSICAL EXAMINATION:   General: thin woman, frail HEENT: scleral icterus, dry OP Neuro: more awake, confused CV: irreg irreg, 120s Resp: distant, no wheeze Abd: RUQ tender, no rebound Ext: thin, no edema Skin: no rash    LABS:  BMET  Recent Labs Lab 05/07/16 1823 05/08/16 0354 05/09/16 0343  NA 142 142 141  K 3.8 3.5 3.5  CL 105 106 104  CO2 28 28 31   BUN 33* 31* 29*  CREATININE 1.14* 0.98 1.04*  GLUCOSE 185* 123* 137*    Electrolytes  Recent Labs Lab 05/05/16 0414 05/06/16 0900  05/07/16 1823 05/08/16 0354 05/09/16 0343  CALCIUM 6.7* 6.8*  < > 8.0* 8.4* 8.4*  MG 2.1 1.9  --  1.9 2.1 1.9  PHOS 5.5* 3.2  --   --   --  2.5  < > = values in this interval not displayed.  CBC  Recent Labs Lab 05/07/16 0405 05/08/16 0354 05/09/16 0343  WBC 8.5 7.5 7.7  HGB 8.3* 8.1* 7.6*  HCT 28.6* 27.8* 26.5*  PLT 91*  93* 95*    Coag's  Recent Labs Lab 17-May-2016 1455 05/05/16 0414 05/07/16 0405 05/08/16 1321  APTT 58* 45*  --   --   INR >10.00* 6.07* 3.16 1.80    Sepsis Markers  Recent Labs Lab May 17, 2016 1455 05-17-16 1701 2016-05-17 2018 05/05/16 0414 05/07/16 0405  LATICACIDVEN  --  3.3* 2.3*  --  1.4  PROCALCITON 1.31  --   --  1.11  --     ABG No results for input(s): PHART, PCO2ART, PO2ART in the last 168 hours.  Liver Enzymes  Recent Labs Lab 2016/05/17 1455 05/06/16 0900 05/07/16 0405  AST 1,212* 577* 347*  ALT 1,464* 907* 712*  ALKPHOS 120 117 120  BILITOT 3.5* 4.0* 3.9*  ALBUMIN 3.2* 3.5 3.3*    Cardiac Enzymes  Recent Labs Lab 05/17/16 1455  TROPONINI 0.04*    Glucose  Recent Labs Lab 05/08/16 1109 05/08/16 1508 05/08/16 1928 05/08/16 2304 05/09/16 0306 05/09/16 0756  GLUCAP 184* 159* 83 125* 141* 90    Imaging No results found.   STUDIES:  CT head 2/1 >> neg acute  CT Chest/abd/pelvis 1/27 >> Advanced centrilobular emphysema and hyperinflation, consistent with COPD.  Superimposed mild interstitial pulmonary  edema.  Moderate in size bilateral simple appearing pleural effusions.Widespread atelectatic changes in the dependent portions of the lungs. More confluent airspace opacity in the right middle lobe may represent round atelectasis/airspace consolidation or less likely pulmonary mass. Attention on follow-up is recommended.Small amount of free pelvic fluid and generalized mesenteric stranding of the abdomen.  CULTURES: BC x 2 2/1 >>ng Urine  2/1 >> multiple species UA 2/1 >> negative  Hepatitis Panel 2/1 >> negative   ANTIBIOTICS: vanc  2/1 >>2/5 Cefepime  2/1 >> 2/1 Meropenem 2/1 >>  SIGNIFICANT EVENTS: 1/27 - 1/29  Admit for CAP  2/01 Admit [facility], shock MODS, hcap? coagulapthy 2/4 amio gtt for RVR    DISCUSSION: 76 y/o F with recent discharge from hospital 1/29 for CAP admitted 2/1 with hypotension, hypothermia, INR>10, AKI and  elevated LFT's.  Concern for sepsis with likely HCAP.  DNR but ct medical care for now.   ASSESSMENT / PLAN:  PULMONARY Acute on chronic hypoxic respiratory failure - multifactorial r/t PNA, COPD, bilat pleural effusion, AMS  Bilateral Pleural Effusions Suspected HCAP  Pulmonary HTN - PA peak pressure 59 11/2015 COPD  P:   DuoNeb ordered prn Appreciate Palliative care assistance, confirmed DNR/I status  CARDIOVASCULAR Septic shock - possible pulmonary source Pulmonary HTN - most recent PA pressure 59mmHg (august 2017) in setting moderate TR  AFib - on Eliquis  CAD s/p CABG 2009 P:  Off anticoag  On dilt CD and metoprolol at home > titrate metoprolol 75bid  & dilt back on - restart digoxin    RENAL AKI on CKD - baseline Scr ~0.9, improving Hypokalemia Hyperphosphatemia  P:   Follow BMP treat electrolyte abnormalities as indicated   GASTROINTESTINAL Nausea/vomiting, improved Liver dysfunction - unclear etiology. No evidence obstructive etiology on CT. ?gilbert syndrome, congestive hepatopathy.  Improving.  Acute hepatitis panel negative.  Small amt pelvic free fluid - ? abd source contributory to sepsis  P:   Follow coags and LFT OK for PO diet, even given risk for aspiration  HEMATOLOGIC Coagulopathy - r/t liver dysfunction of unclear etiology. INR>10 (sepsis in setting eliquis ( arf) poor clearance) Thrombocytopenia  P:  Follow CBC SCD's in place Resume coumadin  INFECTIOUS Sepsis - doubt HCAP , no other source found P:   Broad abx day 567 vanco stopped 2/3   ENDOCRINE Hyperglycemia  AI ruled out - cortisol 71 on admit TSH wnl Hypothermia P:   Follow glucose on BMP  NEUROLOGIC Acute Encephalopathy - likely multifactorial in the setting of sepsis, respiratory failure, failure to thrive >> improving 2/3 P:  Supportive care     FAMILY  - Updates:  daughter 42/5 Agree with DNR/I status, she has recovered but not thriving   Can transfer to tele, ?  Home hospice vs SNF eventual depending on course,  PCCM off   Cyril Mourningakesh Beauford Lando MD. FCCP. [facility] Pager [phone] If no response call [phone]    05/09/2016, 10:31 AM

## 2016-05-09 NOTE — Progress Notes (Signed)
ANTICOAGULATION CONSULT NOTE - Initial Consult  Pharmacy Consult for Heparin Indication: atrial fibrillation  No Known Allergies  Patient Measurements: Height: 4\' 11"  (149.9 cm) Weight: 91 lb 14.9 oz (41.7 kg) IBW/kg (Calculated) : 43.2   Vital Signs: Temp: 97.5 F (36.4 C) (02/06 2000) Temp Source: Oral (02/06 2000) BP: 139/58 (02/06 2130) Pulse Rate: 98 (02/06 2130)  Labs:  Recent Labs  05/07/16 0405 05/07/16 1823 05/08/16 0354 05/08/16 1321 05/09/16 0343 05/09/16 1123 05/09/16 2015  HGB 8.3*  --  8.1*  --  7.6*  --   --   HCT 28.6*  --  27.8*  --  26.5*  --   --   PLT 91*  --  93*  --  95*  --   --   APTT  --   --   --   --   --  39* 89*  LABPROT 33.2*  --   --  21.1*  --   --   --   INR 3.16  --   --  1.80  --   --   --   HEPARINUNFRC  --   --   --   --   --   --  1.94*  HEPRLOWMOCWT  --   --   --   --   --  1.66  --   CREATININE 1.16* 1.14* 0.98  --  1.04*  --   --     Estimated Creatinine Clearance: 30.3 mL/min (by C-G formula based on SCr of 1.04 mg/dL (H)).   Medical History: Past Medical History:  Diagnosis Date  . Anxiety   . CAD (coronary artery disease), native coronary artery 2009   75% LAD, diffuse 75-90% RCA --> Referred for CABG + MAZE;  Cardiolite 12/2013: No ischemia or Infarction  . Cervical neck pain with evidence of disc disease    with need for surgery -- November 2015  . Chronic low back pain    scoliosis & lordosis  . CKD (chronic kidney disease)   . COPD (chronic obstructive pulmonary disease) (HCC)   . Dyslipidemia, goal LDL below 70    On Crestor, followed by PCP  . Essential hypertension   . H/O ischemic left MCA stroke 11/18/2014   MRI/MRA of head: Multifocal left MCA infarct.. Also possible left ACA territory infarct -- complete occlusion of left MCA distally at M2 level. Marked left ACA attenuation.  . H/O: pneumonia   . Hypothyroidism   . Ischemic cardiomyopathy 2012   EF ~40-45% by Echo  . Osteoarthritis of back    And  neck, hands,spine  . PAD (peripheral artery disease) (HCC) 2007   s/p L Ileac A stent ; most recent Dopplers July 2012: Less than 50% reduction bilaterally. ABI 0.96 on the right 0.88 on left;;;12/27/2011   -ABI right .87 and left ABI .78  ,LEFT CIA and EIA stent normall patency, left CFA,SFA,and popliteal 0-49%; rgt proximal SFA 50-69%,rgt CIA,EIA, and CFA 0-49%  . Persistent atrial fibrillation (HCC) 04/05/2012   Now permanent A. fib s/p MAZE -- recurrence, cardioversion-converted to sinus bradycardia --> Now persistent  . Pulmonary hypertension    Estimated PA pressure on Echo January 2017 = 59 mmHg with dilated IVC, severely dilated RA and moderate TR.  . S/P CABG x 2 2009   LIMA-LAD, SVG-RCA, with AV fistula ligation and Maze procedure  . Stroke (HCC)   . TIA (transient ischemic attack) 04/05/2015    Medications:  Scheduled:  . diltiazem  60  mg Oral TID  . famotidine  20 mg Oral QHS  . feeding supplement  1 Container Oral TID BM  . insulin aspart  0-9 Units Subcutaneous Q4H  . levothyroxine  37.5 mcg Oral QAC breakfast  . meropenem (MERREM) IV  1 g Intravenous Q12H  . metoprolol tartrate  75 mg Oral BID   Infusions:  . heparin 600 Units/hr (05/09/16 2100)   PRN: sodium chloride, ipratropium-albuterol, metoprolol, oxyCODONE-acetaminophen  Assessment: 76 y/o F with PMH including atrial fibrillation, stroke, CKD, CAD s/p CABG, ischemic cardiomyopathy, who was admitted with septic shock.  PTA was on Eliquis 5 mg BID (last dose taken 2/1); coagulopathy noted on admission with INR > 10 and elevated LFTs.  Eliquis was held; vitamin K 10 mg IV x 1 was also given since LFTs were abnormal.  Coagulopathy now resolving w/INR < 2.  Apparent plan is to resume Eliquis when medically stable, but for now, received orders from attending MD to begin IV heparin. Thrombocytopenia noted but has been improving over the past 3 days - reviewed with attending MD.  APTT = 89 sec with heparin infusion @ 600  units/hr: therapeutic Heparin Level = 1.94 (falsely elevated due to recent DOAC therapy)  Goal Range:  APTT 66 - 102 sec (follow until confirmation that  APTT and heparin [anti-Xa] level are correlating in setting of recent DOAC therapy) Heparin level 0.3-0.7 units/ml  Monitor platelets by anticoagulation protocol: Yes   Plan:  Continue heparin IV infusion at 600 units/hr Daily heparin level Daily APTT until correlation with heparin level confirmed Daily CBC Follow clinical course, anticipate transitioning back to Eliquis when medically stable.  Terrilee FilesLeann Brooks Kinnan, PharmD 05/09/2016  9:45 PM

## 2016-05-09 NOTE — Progress Notes (Signed)
Spoke with Dr. Isaiah SergeMannam regarding pt BP MAP of 55 and pt urine output. MD advised against fluid resuscitation at this time. RN to continue to monitor BP. Will continue to monitor.

## 2016-05-10 DIAGNOSIS — Z66 Do not resuscitate: Secondary | ICD-10-CM

## 2016-05-10 LAB — COMPREHENSIVE METABOLIC PANEL
ALBUMIN: 3.5 g/dL (ref 3.5–5.0)
ALK PHOS: 107 U/L (ref 38–126)
ALT: 352 U/L — AB (ref 14–54)
AST: 73 U/L — AB (ref 15–41)
Anion gap: 6 (ref 5–15)
BILIRUBIN TOTAL: 2.2 mg/dL — AB (ref 0.3–1.2)
BUN: 27 mg/dL — AB (ref 6–20)
CALCIUM: 8.7 mg/dL — AB (ref 8.9–10.3)
CO2: 31 mmol/L (ref 22–32)
CREATININE: 1.13 mg/dL — AB (ref 0.44–1.00)
Chloride: 105 mmol/L (ref 101–111)
GFR calc Af Amer: 53 mL/min — ABNORMAL LOW (ref >60)
GFR calc non Af Amer: 46 mL/min — ABNORMAL LOW (ref >60)
GLUCOSE: 123 mg/dL — AB (ref 65–99)
Potassium: 3.8 mmol/L (ref 3.5–5.1)
Sodium: 142 mmol/L (ref 135–145)
Total Protein: 5.8 g/dL — ABNORMAL LOW (ref 6.5–8.1)

## 2016-05-10 LAB — CBC
HEMATOCRIT: 31.4 % — AB (ref 36.0–46.0)
HEMOGLOBIN: 8.5 g/dL — AB (ref 12.0–15.0)
MCH: 23.4 pg — ABNORMAL LOW (ref 26.0–34.0)
MCHC: 27.1 g/dL — ABNORMAL LOW (ref 30.0–36.0)
MCV: 86.3 fL (ref 78.0–100.0)
Platelets: 100 10*3/uL — ABNORMAL LOW (ref 150–400)
RBC: 3.64 MIL/uL — ABNORMAL LOW (ref 3.87–5.11)
RDW: 20.5 % — ABNORMAL HIGH (ref 11.5–15.5)
WBC: 8.5 10*3/uL (ref 4.0–10.5)

## 2016-05-10 LAB — GLUCOSE, CAPILLARY
Glucose-Capillary: 133 mg/dL — ABNORMAL HIGH (ref 65–99)
Glucose-Capillary: 113 mg/dL — ABNORMAL HIGH (ref 65–99)

## 2016-05-10 LAB — HEPARIN LEVEL (UNFRACTIONATED): Heparin Unfractionated: 1.84 IU/mL — ABNORMAL HIGH (ref 0.30–0.70)

## 2016-05-10 LAB — APTT: aPTT: >200 seconds (ref 24–36)

## 2016-05-10 MED ORDER — CHLORHEXIDINE GLUCONATE 0.12 % MT SOLN: 15.0000 mL | Freq: Two times a day (BID) | OROMUCOSAL | Status: DC

## 2016-05-10 MED ORDER — ORAL CARE MOUTH RINSE: 15.0000 mL | Freq: Two times a day (BID) | OROMUCOSAL | Status: DC

## 2016-05-10 MED ORDER — MORPHINE SULFATE (PF) 2 MG/ML IV SOLN: 1.0000 mg | INTRAVENOUS | Status: DC | PRN

## 2016-05-10 MED ORDER — HEPARIN (PORCINE) IN NACL 100-0.45 UNIT/ML-% IJ SOLN
400.0000 [IU]/h | INTRAMUSCULAR | Status: DC
  Filled 2016-05-10: qty 250

## 2016-06-01 NOTE — Progress Notes (Addendum)
RN noticed pt felt cool to touch and pt was less responsive. VS unchanged and pt hard to arouse with no eye contact. Rectal temperature 94.37F. Dr. Isaiah SergeMannam notified regarding pt's change in mentation and rectal temperature. MD ordered warming blanket to be applied. Warming blanket applied. Will continue to monitor.

## 2016-06-01 NOTE — Progress Notes (Signed)
ANTICOAGULATION CONSULT NOTE - f/u Consult  Pharmacy Consult for Heparin Indication: atrial fibrillation  No Known Allergies  Patient Measurements: Height: 4\' 11"  (149.9 cm) Weight: 89 lb 15.2 oz (40.8 kg) IBW/kg (Calculated) : 43.2   Vital Signs: Temp: 94.7 F (34.8 C) (02/07 0450) Temp Source: Rectal (02/07 0450) BP: 104/68 (02/07 0450) Pulse Rate: 92 (02/07 0400)  Labs:  Recent Labs  05/08/16 0354 05/08/16 1321 05/09/16 0343 05/09/16 1123 05/09/16 2015 May 21, 2016 0414  HGB 8.1*  --  7.6*  --   --  8.5*  HCT 27.8*  --  26.5*  --   --  31.4*  PLT 93*  --  95*  --   --  100*  APTT  --   --   --  39* 89* >200*  LABPROT  --  21.1*  --   --   --   --   INR  --  1.80  --   --   --   --   HEPARINUNFRC  --   --   --   --  1.94* 1.84*  HEPRLOWMOCWT  --   --   --  1.66  --   --   CREATININE 0.98  --  1.04*  --   --  1.13*    Estimated Creatinine Clearance: 27.3 mL/min (by C-G formula based on SCr of 1.13 mg/dL (H)).   Medical History: Past Medical History:  Diagnosis Date  . Anxiety   . CAD (coronary artery disease), native coronary artery 2009   75% LAD, diffuse 75-90% RCA --> Referred for CABG + MAZE;  Cardiolite 12/2013: No ischemia or Infarction  . Cervical neck pain with evidence of disc disease    with need for surgery -- November 2015  . Chronic low back pain    scoliosis & lordosis  . CKD (chronic kidney disease)   . COPD (chronic obstructive pulmonary disease) (HCC)   . Dyslipidemia, goal LDL below 70    On Crestor, followed by PCP  . Essential hypertension   . H/O ischemic left MCA stroke 11/18/2014   MRI/MRA of head: Multifocal left MCA infarct.. Also possible left ACA territory infarct -- complete occlusion of left MCA distally at M2 level. Marked left ACA attenuation.  . H/O: pneumonia   . Hypothyroidism   . Ischemic cardiomyopathy 2012   EF ~40-45% by Echo  . Osteoarthritis of back    And neck, hands,spine  . PAD (peripheral artery disease) (HCC)  2007   s/p L Ileac A stent ; most recent Dopplers July 2012: Less than 50% reduction bilaterally. ABI 0.96 on the right 0.88 on left;;;12/27/2011   -ABI right .87 and left ABI .78  ,LEFT CIA and EIA stent normall patency, left CFA,SFA,and popliteal 0-49%; rgt proximal SFA 50-69%,rgt CIA,EIA, and CFA 0-49%  . Persistent atrial fibrillation (HCC) 04/05/2012   Now permanent A. fib s/p MAZE -- recurrence, cardioversion-converted to sinus bradycardia --> Now persistent  . Pulmonary hypertension    Estimated PA pressure on Echo January 2017 = 59 mmHg with dilated IVC, severely dilated RA and moderate TR.  . S/P CABG x 2 2009   LIMA-LAD, SVG-RCA, with AV fistula ligation and Maze procedure  . Stroke (HCC)   . TIA (transient ischemic attack) 04/05/2015    Medications:  Scheduled:  . diltiazem  60 mg Oral TID  . famotidine  20 mg Oral QHS  . feeding supplement  1 Container Oral TID BM  . insulin aspart  0-9  Units Subcutaneous Q4H  . levothyroxine  37.5 mcg Oral QAC breakfast  . meropenem (MERREM) IV  1 g Intravenous Q12H   Infusions:  . heparin Stopped (05/09/2016 0525)   PRN: sodium chloride, ipratropium-albuterol, metoprolol, oxyCODONE-acetaminophen  Assessment: 76 y/o F with PMH including atrial fibrillation, stroke, CKD, CAD s/p CABG, ischemic cardiomyopathy, who was admitted with septic shock.  PTA was on Eliquis 5 mg BID (last dose taken 2/1); coagulopathy noted on admission with INR > 10 and elevated LFTs.  Eliquis was held; vitamin K 10 mg IV x 1 was also given since LFTs were abnormal.  Coagulopathy now resolving w/INR < 2.  Apparent plan is to resume Eliquis when medically stable, but for now, received orders from attending MD to begin IV heparin. Thrombocytopenia noted but has been improving over the past 3 days - reviewed with attending MD.  APTT = >200 sec? with heparin infusion @ 600 units/hr -verified HL drawn from opposite arm as heparin infusion Heparin Level = 1.84 (falsely  elevated due to recent DOAC therapy) plts=100  Goal Range:  APTT 66 - 102 sec (follow until confirmation that  APTT and heparin [anti-Xa] level are correlating in setting of recent DOAC therapy) Heparin level 0.3-0.7 units/ml  Monitor platelets by anticoagulation protocol: Yes   Plan:  Hold x 30 mins At 0600 restart heparin drip at 400 units/hr Recheck HL in 8 hours Daily heparin level Daily APTT until correlation with heparin level confirmed Daily CBC Follow clinical course, anticipate transitioning back to Eliquis when medically stable.  Lorenza EvangelistGreen, Trameka Dorough R 05/20/2016  5:29 AM

## 2016-06-01 NOTE — Progress Notes (Signed)
Spoke with Dr. Isaiah SergeMannam regarding pt having soft BPs, most recent 80/50 manually. RN to continue to monitor BP. Will continue to monitor.

## 2016-06-01 NOTE — Progress Notes (Signed)
Nutrition Brief Note  Pt seen by RD on 2/2 and was NPO at that time. Diet was subsequently advanced to FLD on 2/3 and Boost Breeze was ordered TID.  Chart reviewed. Pt now transitioning to comfort care. Per Palliative Care note this AM and RN during rounds, pt is actively dying.  No further nutrition interventions warranted at this time.      Trenton GammonJessica Ghada Abbett, MS, RD, LDN, [facility] Inpatient Clinical Dietitian Pager # [phone] After hours/weekend pager # [phone]

## 2016-06-01 NOTE — Progress Notes (Signed)
CRITICAL VALUE ALERT  Critical value received:  PTT >200  Date of notification:  05/12/2016  Time of notification:  0520  Critical value read back:Yes.    Nurse who received alert:  Rutha BouchardShelby Amya Hlad, RN  MD notified (1st page):  Merdis DelayK. Schorr, NP/ Pharmacist  Time of first page:  [phone]  MD notified (2nd page):  Time of second page:  Responding MD:  K.Schorr, NP/Pharmacist  Time MD responded:  [phone]

## 2016-06-01 NOTE — Progress Notes (Signed)
eLink Physician-Brief Progress Note Patient Name: Brenda RinksRobina B Puga DOB: 07/02/1940 MRN: [id number]   Date of Service  05/07/2016  HPI/Events of Note  BP continues to be low MAP ~60  eICU Interventions  Holding Cardizem and lopressor Try to avoid fluids as she has CHF, pulmonary HTN Continue to monitor BP     Intervention Category Major Interventions: Hypotension - evaluation and management  Chanelle Hodsdon 05/20/2016, 12:58 AM

## 2016-06-01 NOTE — Progress Notes (Signed)
Daily Progress Note   Patient Name: [name]       Date: 17-May-2016 DOB: May 09, 1940  Age: 76 y.o. MRN#: [id number] Attending Physician: Edsel Petrin, DO Primary Care Physician: [name], MD Admit Date: 05/13/2016  Reason for Consultation/Follow-up: Establishing goals of care  76 yo admitted with acute on chronic hypoxic resp failure, sepsis, suspected PNA, A fib, stage III CKD, bilateral pleural effusions.  Subjective:  agonal breaths, appears to be actively dying Daughter at bedside Overnight events noted, patient with V tach, hypothermia and low BPs See below    Length of Stay: 6  Current Medications: Scheduled Meds:  . chlorhexidine  15 mL Mouth Rinse BID  . feeding supplement  1 Container Oral TID BM  . mouth rinse  15 mL Mouth Rinse q12n4p    Continuous Infusions:   PRN Meds: sodium chloride, ipratropium-albuterol, morphine injection  Physical Exam         Pale appearing Actively dying Shallow agonal breaths V tach on monitor No edema Muscle wasting Unresponsive  Vital Signs: BP (!) 91/52   Pulse (!) 103   Temp 97.9 F (36.6 C) (Oral)   Resp 15   Ht 4\' 11"  (1.499 m)   Wt 40.8 kg (89 lb 15.2 oz)   SpO2 96%   BMI 18.17 kg/m  SpO2: SpO2: 96 % O2 Device: O2 Device: Nasal Cannula O2 Flow Rate: O2 Flow Rate (L/min): 3 L/min  Intake/output summary:  Intake/Output Summary (Last 24 hours) at May 17, 2016 4098 Last data filed at 05/17/16 0400  Gross per 24 hour  Intake              398 ml  Output              395 ml  Net                3 ml   LBM: Last BM Date: 05/07/16 Baseline Weight: Weight: 33.6 kg (74 lb) Most recent weight: Weight: 40.8 kg (89 lb 15.2 oz)       Palliative Assessment/Data:    Flowsheet Rows   Flowsheet Row Most Recent Value    Intake Tab  Referral Department  Hospitalist  Unit at Time of Referral  Intermediate Care Unit  Palliative Care Primary Diagnosis  Sepsis/Infectious Disease  Date Notified  05/05/16  Palliative Care Type  Return patient Palliative Care  Reason for referral  End of Life Care Assistance  Date of Admission  15-May-2016  Date first seen by Palliative Care  05/05/16  # of days Palliative referral response time  0 Day(s)  # of days IP prior to Palliative referral  1  Clinical Assessment  Palliative Performance Scale Score  10%  Pain Max last 24 hours  4  Pain Min Last 24 hours  3  Dyspnea Max Last 24 Hours  6  Dyspnea Min Last 24 hours  5  Psychosocial & Spiritual Assessment  Palliative Care Outcomes  Patient/Family meeting held?  Yes  Who was at the meeting?  Daughter      Patient Active Problem List   Diagnosis Date Noted  . Supratherapeutic INR   . Septic shock (HCC)   . Shock (HCC) 15-May-2016  . Acute respiratory failure (HCC)   . Sepsis (HCC)   . Acute hypoxemic respiratory failure (HCC) 04/29/2016  . Pacemaker   . Tachy-brady syndrome (HCC)   . Severe protein-calorie malnutrition (HCC) 11/18/2015  . Hypokalemia 11/17/2015  . Microcytic anemia 11/17/2015  . Chronic diastolic CHF (congestive heart failure) (HCC) 11/17/2015  . Chronic atrial fibrillation with RVR (HCC) 11/17/2015  . Pleural effusion 09/22/2015  . CAP (community acquired pneumonia) 09/18/2015  . Atrial fibrillation with RVR (HCC) 09/18/2015  . Chronic atrial fibrillation (HCC) 09/13/2015  . Chronic anticoagulation 09/13/2015  . Pulmonary hypertension   . Cough   . History of stroke   . Acute encephalopathy   . Acute cystitis without hematuria   . Aspiration pneumonia (HCC)   . TIA (transient ischemic attack) 04/05/2015  . CKD (chronic kidney disease) stage 3, GFR 30-59 ml/min 11/18/2014  . H/O ischemic left MCA stroke 11/18/2014  . Stroke with cerebral ischemia (HCC)   . Weight loss   . Spinal  stenosis in cervical region 01/23/2014  . Preoperative cardiovascular examination 01/13/2014  . Ischemic cardiomyopathy   . PAD (peripheral artery disease) (HCC)   . S/P CABG x 2   . Nausea and vomiting 11/29/2011  . 2 Vessel CAD - s/p CABG; LIMA-LAD, SVG-RCA 11/29/2011  . Permanent atrial fibrillation (HCC);  CHA2DS2Vasc is at least 7 on Eliquis.   11/29/2011  . Hyperlipidemia with target LDL less than 70 11/29/2011  . Essential hypertension 11/29/2011  . Hypothyroidism 11/29/2011    Palliative Care Assessment & Plan   Patient Profile:    Assessment:  76 yo admitted with acute on chronic hypoxic resp failure, sepsis, suspected PNA, A fib, stage III CKD, bilateral pleural effusions.  Dyspnea at end of life Unresponsive Actively dying  Recommendations/Plan:  Comfort measures only: discussed in detail with daughter Deedee maintaining vigil at the bedside, daughter spent the night here. She recognizes that the patient is dying, patient does not respond, has shallow respirations. End of life signs and symptoms discussed with Deedee. Brief life review performed, patient's family is in [place], she is originally from [place]. Deedee is only child. Deedee has cousins and extended family here for support.   D/C heparin, labs, finger sticks, insulin, PO meds and antibiotics  Add low dose Morphine IV PRN for resp distress at end of life, discussed with daughter.   Goals of Care and Additional Recommendations:  Limitations on Scope of Treatment: Full Comfort Care  Code Status:    Code Status Orders        Start     Ordered   05-15-16 1643  Do not attempt resuscitation (DNR)  Continuous    Question Answer Comment  In the event of cardiac or respiratory ARREST Do not call a "code blue"   In the event of cardiac or respiratory ARREST Do not perform Intubation, CPR, defibrillation or ACLS   In the event of cardiac or respiratory ARREST Use medication by any route, position, wound  care, and other measures to relive pain and suffering. May use oxygen, suction and manual treatment of airway obstruction as needed for comfort.      05/28/2016 1644    Code Status History    Date Active Date Inactive Code Status Order ID Comments User Context   05/16/2016  4:41 PM 05/16/2016  4:44 PM DNR [id number]  Nelda Bucksaniel J Feinstein, MD ED   04/29/2016  6:41 PM 05/01/2016  5:06 PM DNR [id number]  Tyrone Nineyan B Grunz, MD Inpatient   11/17/2015  1:24 AM 11/24/2015  6:56 PM DNR [id number]  Briscoe Deutscherimothy S Opyd, MD ED   09/18/2015  3:35 PM 09/23/2015  8:01 PM DNR [id number]  Darreld McleanVishal Patel, MD Inpatient   09/18/2015 12:26 PM 09/18/2015  3:35 PM DNR [id number]  Beather Arbourushil Patel V, MD ED   04/05/2015 11:22 PM 04/09/2015  6:00 PM Full Code [id number]  Eduard ClosArshad N Kakrakandy, MD Inpatient   11/18/2014  4:20 AM 11/19/2014  9:03 PM Full Code [id number]  Eduard ClosArshad N Kakrakandy, MD Inpatient   01/23/2014 12:57 PM 01/24/2014  4:00 PM Full Code [id number]  Mariam DollarGary P Cram, MD Inpatient   11/29/2011  9:55 PM 11/30/2011  6:11 PM Full Code [id number]  Darryl Berenice Primasaymond Gardner, RN Inpatient       Prognosis:   minutes to hours, discussed with daughter   Discharge Planning:  Anticipated Hospital Death  Care plan was discussed with  Dr Catha GosselinMikhail, RN, daughter   Thank you for allowing the Palliative Medicine Team to assist in the care of this patient.   Time In: 8 Time Out: 8.40 Total Time 40 Prolonged Time Billed  no       Greater than 50%  of this time was spent counseling and coordinating care related to the above assessment and plan.  Rosalin HawkingZeba Ryo Klang, MD [phone]  Please contact Palliative Medicine Team phone at [phone] for questions and concerns.

## 2016-06-01 NOTE — Progress Notes (Signed)
Time of death 14:06. Absence of breath and heart sounds verified by this RN and Len Blalockenise Aldridge, RN. Daughter and family friends at bedside at time of death. Patient belongings given to daughter Deedee.

## 2016-06-01 NOTE — Progress Notes (Signed)
eLink Physician-Brief Progress Note Patient Name: [name] DOB: 12/19/1940 MRN: [id number]   Date of Service  05/09/2016  HPI/Events of Note  Hypothermia  eICU Interventions  Warming blanket     Intervention Category Intermediate Interventions: Other:  [name] 05/14/2016, 4:52 AM

## 2016-06-01 NOTE — Progress Notes (Signed)
PROGRESS NOTE    [name]  WUJ:[id number] DOB: 01-07-1941 DOA: 05/14/2016 PCP: [name], MD   Chief Complaint  Patient presents with  . Weakness    Brief Narrative:  HPI on 05/14/2016 by Dr. Rory Percy  [phone] female with multiple medical problems including CAD s/p CABG 2009, CKD, previous stroke, AFib on eliquis, Pulmonary HTN with recent admission 1/27-1/29 for sepsis/CAP, elevated LFT's, n/v/d.  She was d/c on augmentin and supplemental O2.  She returned 2/1 with weakness, AMS.   In ER had significant hypotension, lactic acid 5, poor mental status, coagulopathy with INR>10, significantly elevated LFT's, AKI.  PCCM consulted for ICU admit for presumed septic shock.  Assessment & Plan   Overnight, patient became Hypothermic and less responsive. Patient to have a rectal temperature of 94.60F. One blanket was applied. Patient also had nonsustained V. tach yesterday evening. Currently blood pressure is 66 systolically. Daughter at bedside, and has agreed to transition patient to comfort care. Continue morphine as needed for comfort.  Acute on chronic hypoxemic respiratory failure -Multifactorial secondary to pneumonia, COPD, bilateral pleural effusions -Continue nebs, treatment for pneumonia and COPD -Continue supplemental oxygen as needed  Septic shock secondary to Suspected pneumonia -Sepsis etiology appears to be improving -Continue merrum  Atrial fibrillation -Patient did require amiodarone drip -Continue cardizem and metoprolol  -CHADSVASC at least 3 (age, gender, HTN, h/o CVA) -platelets 95 -plan is to restart Eliquis  -For now, will place on Heparin  AKI on chronic kidney disease, stage III -Baseline creatinine approximately 0.9 -Upon admission, creatinine 1.94, currently 1.13 -Continue to monitor BMP  Bilateral pleural effusions -Noted on chest x-ray, moderate on the left and small moderate on the right. -Likely secondary to IV fluids  Pulmonary hypertension -PA  peak pressure 59  Acute encephalopathy -Secondary to multifactorial causes including pneumonia, shock, hypoxia -Waxing and waning  Hypokalemia/hypophosphatemia -Resolved -Continue to monitor   Coagulopathy -Unclear etiology, possibly sepsis vs liver etiology  -Patient was on Eliquis upon admission, INR >10  Normocytic Anemia, chronic  -Possibly dilutional as patient presented with septic shock and received fluids -hemoglobin has been between 7 and 8 during hospitalization, however 2017 Baseline appears to be 10-11 -hemoglobin currently 8.5  Thrombocytopenia -Platelet currently 100 -Possibly due to sepsis  Hyperglycemia -Continue insulin fine scale CBG monitoring -no history of diabetes  Small amount of pelvic free fluid -question abdominal source with sepsis  Elevated LFTs -Trending downward -hepatitis panel negative -Continue to monitor   Goals of care -Palliative care consulted and appreciated -Given patient's deterioration, patient will be transitioned to comfort care today.  Deconditioning  DVT Prophylaxis  None. Comfort Care  Code Status: DNR  Family Communication: Daughter at bedside  Disposition Plan: Admitted. Suspect hospital death.  Consultants PCCM Palliative care  Procedures  None  Antibiotics   Anti-infectives    Start     Dose/Rate Route Frequency Ordered Stop   05/08/16 2200  meropenem (MERREM) 1 g in sodium chloride 0.9 % 100 mL IVPB  Status:  Discontinued     1 g 200 mL/hr over 30 Minutes Intravenous Every 12 hours 05/08/16 1029 05/06/2016 0837   05/06/16 1600  vancomycin (VANCOCIN) 500 mg in sodium chloride 0.9 % 100 mL IVPB  Status:  Discontinued     500 mg 100 mL/hr over 60 Minutes Intravenous Every 48 hours 05/25/2016 1659 05/06/16 0952   05/05/16 1600  vancomycin (VANCOCIN) 500 mg in sodium chloride 0.9 % 100 mL IVPB  Status:  Discontinued  500 mg 100 mL/hr over 60 Minutes Intravenous Every 24 hours 2016-05-14 1526 2016/05/14 1659    05/05/16 1500  ceFEPIme (MAXIPIME) 1 g in dextrose 5 % 50 mL IVPB  Status:  Discontinued     1 g 100 mL/hr over 30 Minutes Intravenous Every 24 hours May 14, 2016 1526 05/14/2016 1649   May 14, 2016 2200  meropenem (MERREM) 500 mg in sodium chloride 0.9 % 50 mL IVPB  Status:  Discontinued     500 mg 100 mL/hr over 30 Minutes Intravenous Every 12 hours 14-May-2016 1659 05/08/16 1029   05-14-16 1700  metroNIDAZOLE (FLAGYL) IVPB 250 mg  Status:  Discontinued     250 mg 50 mL/hr over 60 Minutes Intravenous Every 8 hours 05-14-2016 1645 05-14-2016 1649   2016/05/14 1530  vancomycin (VANCOCIN) 500 mg in sodium chloride 0.9 % 100 mL IVPB     500 mg 100 mL/hr over 60 Minutes Intravenous To Emergency Dept 05-14-16 1526 05/14/16 1650   2016-05-14 1515  ceFEPIme (MAXIPIME) 2 g in dextrose 5 % 50 mL IVPB     2 g 100 mL/hr over 30 Minutes Intravenous  Once May 14, 2016 1509 05-14-16 1548      Subjective:   [name] seen and examined today. Patient currently unresponsive.   Objective:   Vitals:   05/26/2016 0900 05/27/2016 1000 05/07/2016 1100 05/06/2016 1200  BP:      Pulse:      Resp: (!) 24 (!) 32 20 16  Temp:      TempSrc:      SpO2:      Weight:      Height:        Intake/Output Summary (Last 24 hours) at 05/23/2016 1239 Last data filed at 05/28/2016 0900  Gross per 24 hour  Intake            307.8 ml  Output              300 ml  Net              7.8 ml   Filed Weights   05/08/16 0500 05/09/16 0200 05/17/2016 0100  Weight: 41.7 kg (91 lb 14.9 oz) 41.7 kg (91 lb 14.9 oz) 40.8 kg (89 lb 15.2 oz)    Exam  General: Well developed, thin, chronically-ill appearing. pale  HEENT: NCAT, scleral icterus, mucous membranes dry.   Cardiovascular: S1 S2 auscultated, Irregularly irregular (monitor shows VT)  Respiratory: Shallow agonal breathing.   Abdomen: Soft, RUQ TTP, nondistended, + bowel sounds  Extremities: warm dry without cyanosis clubbing or edema. Muscle wasting  Neuro: Unresponsive    Data Reviewed:  I have personally reviewed following labs and imaging studies  CBC:  Recent Labs Lab May 14, 2016 1455  05/06/16 0900 05/07/16 0405 05/08/16 0354 05/09/16 0343 05/13/2016 0414  WBC 10.0  < > 10.7* 8.5 7.5 7.7 8.5  NEUTROABS 8.8*  --   --   --   --   --   --   HGB 7.5*  < > 8.5* 8.3* 8.1* 7.6* 8.5*  HCT 27.3*  < > 27.8* 28.6* 27.8* 26.5* 31.4*  MCV 82.5  < > 81.8 81.0 84.0 86.0 86.3  PLT 300  < > 134* 91* 93* 95* 100*  < > = values in this interval not displayed. Basic Metabolic Panel:  Recent Labs Lab 05/05/16 0414 05/06/16 0900 05/07/16 0405 05/07/16 1823 05/08/16 0354 05/09/16 0343 05/22/2016 0414  NA 133* 136 139 142 142 141 142  K 5.9* 3.5 2.8* 3.8  3.5 3.5 3.8  CL 99* 99* 103 105 106 104 105  CO2 21* 24 26 28 28 31 31   GLUCOSE 121* 137* 116* 185* 123* 137* 123*  BUN 50* 51* 42* 33* 31* 29* 27*  CREATININE 1.91* 1.62* 1.16* 1.14* 0.98 1.04* 1.13*  CALCIUM 6.7* 6.8* 7.4* 8.0* 8.4* 8.4* 8.7*  MG 2.1 1.9  --  1.9 2.1 1.9  --   PHOS 5.5* 3.2  --   --   --  2.5  --    GFR: Estimated Creatinine Clearance: 27.3 mL/min (by C-G formula based on SCr of 1.13 mg/dL (H)). Liver Function Tests:  Recent Labs Lab 12-01-16 1455 05/06/16 0900 05/07/16 0405 05/25/2016 0414  AST 1,212* 577* 347* 73*  ALT 1,464* 907* 712* 352*  ALKPHOS 120 117 120 107  BILITOT 3.5* 4.0* 3.9* 2.2*  PROT 5.4* 6.0* 5.6* 5.8*  ALBUMIN 3.2* 3.5 3.3* 3.5    Recent Labs Lab 12-01-16 1455  LIPASE 114*  AMYLASE 156*    Recent Labs Lab 05/05/16 1201  AMMONIA 32   Coagulation Profile:  Recent Labs Lab 12-01-16 1455 05/05/16 0414 05/07/16 0405 05/08/16 1321  INR >10.00* 6.07* 3.16 1.80   Cardiac Enzymes:  Recent Labs Lab 12-01-16 1455  TROPONINI 0.04*   BNP (last 3 results) No results for input(s): PROBNP in the last 8760 hours. HbA1C: No results for input(s): HGBA1C in the last 72 hours. CBG:  Recent Labs Lab 05/09/16 1547 05/09/16 1930 05/09/16 2307 05/25/2016 0251  05/08/2016 0744  GLUCAP 127* 149* 124* 133* 113*   Lipid Profile: No results for input(s): CHOL, HDL, LDLCALC, TRIG, CHOLHDL, LDLDIRECT in the last 72 hours. Thyroid Function Tests: No results for input(s): TSH, T4TOTAL, FREET4, T3FREE, THYROIDAB in the last 72 hours. Anemia Panel: No results for input(s): VITAMINB12, FOLATE, FERRITIN, TIBC, IRON, RETICCTPCT in the last 72 hours. Urine analysis:    Component Value Date/Time   COLORURINE AMBER (A) September 23, 2016 1701   APPEARANCEUR HAZY (A) September 23, 2016 1701   LABSPEC 1.018 September 23, 2016 1701   PHURINE 5.0 September 23, 2016 1701   GLUCOSEU NEGATIVE September 23, 2016 1701   HGBUR NEGATIVE September 23, 2016 1701   BILIRUBINUR NEGATIVE September 23, 2016 1701   KETONESUR 5 (A) September 23, 2016 1701   PROTEINUR 100 (A) September 23, 2016 1701   UROBILINOGEN 0.2 11/03/2014 2115   NITRITE NEGATIVE September 23, 2016 1701   LEUKOCYTESUR NEGATIVE September 23, 2016 1701   Sepsis Labs: @LABRCNTIP (procalcitonin:4,lacticidven:4)  ) Recent Results (from the past 240 hour(s))  Urine culture     Status: Abnormal   Collection Time: 12-01-16  2:22 PM  Result Value Ref Range Status   Specimen Description URINE, CLEAN CATCH  Final   Special Requests NONE  Final   Culture MULTIPLE SPECIES PRESENT, SUGGEST RECOLLECTION (A)  Final   Report Status 05/06/2016 FINAL  Final  Blood Culture (routine x 2)     Status: None   Collection Time: 12-01-16  2:55 PM  Result Value Ref Range Status   Specimen Description BLOOD LEFT ANTECUBITAL  Final   Special Requests AEROBIC BOTTLE ONLY 5CC  Final   Culture   Final    NO GROWTH 5 DAYS Performed at [facility], [address] [place], [place] [address]    Report Status 05/09/2016 FINAL  Final  Blood Culture (routine x 2)     Status: None   Collection Time: 12-01-16  2:58 PM  Result Value Ref Range Status   Specimen Description BLOOD RIGHT ANTECUBITAL  Final   Special Requests BOTTLES DRAWN AEROBIC AND ANAEROBIC 5CC  Final   Culture   Final    NO GROWTH 5  DAYS Performed at [facility], [address], [place], [place] [address]    Report Status 05/09/2016 FINAL  Final  MRSA PCR Screening     Status: None   Collection Time: 05/08/16 10:53 AM  Result Value Ref Range Status   MRSA by PCR NEGATIVE NEGATIVE Final    Comment:        The GeneXpert MRSA Assay (FDA approved for NASAL specimens only), is one component of a comprehensive MRSA colonization surveillance program. It is not intended to diagnose MRSA infection nor to guide or monitor treatment for MRSA infections.       Radiology Studies: No results found.   Scheduled Meds: . chlorhexidine  15 mL Mouth Rinse BID  . feeding supplement  1 Container Oral TID BM  . mouth rinse  15 mL Mouth Rinse q12n4p   Continuous Infusions:   LOS: 6 days   Time Spent in minutes   30 minutes  Kensy Blizard D.O. on 06/01/2016 at 12:39 PM  Between 7am to 7pm - Pager - [phone]  After 7pm go to [email or web address] - password [email or web address]  And look for the night coverage person covering for me after hours  [facility] Office  [phone]

## 2016-06-01 NOTE — Discharge Summary (Addendum)
Death Summary  [name] ZOX:[id number] DOB: 1941-03-27 DOA: Jun 03, 2016  PCP: [name], MD  Admit date: 06/03/16 Date of Death: Jun 09, 2016 Time of Death: 1404-09-08 Notification: [name], MD notified of death of 2016/06/09   History of present illness:  on 2016-06-03 by Dr. Rory Percy  [phone] female with multiple medical problems including CAD s/p CABG 09-09-2007, CKD, previous stroke, AFib on eliquis, Pulmonary HTN with recent admission 1/27-1/29 for sepsis/CAP, elevated LFT's, n/v/d. She was d/c on augmentin and supplemental O2. She returned 2/1 with weakness, AMS. In ER had significant hypotension, lactic acid 5, poor mental status, coagulopathy with INR>10, significantly elevated LFT's, AKI. PCCM consulted for ICU admit for presumed septic shock.  Final Diagnoses/Hospital course:  Overnight, patient became Hypothermic and less responsive. Patient to have a rectal temperature of 94.25F. One blanket was applied. Patient also had nonsustained V. tach yesterday evening. Currently blood pressure is 66 systolically. Daughter at bedside, and has agreed to transition patient to comfort care. Continue morphine as needed for comfort. She expired at 1406.   Acute on chronic hypoxemic respiratory failure -Multifactorial secondary to pneumonia, COPD, bilateral pleural effusions -Continue nebs, treatment for pneumonia and COPD -Continue supplemental oxygen as needed  Septic shock secondary to Suspected pneumonia -Sepsis etiology appears to be improving -Continue merrum  Atrial fibrillation -Patient did require amiodarone drip -Continue cardizem and metoprolol  -CHADSVASC at least 35 (age, gender, HTN, h/o CVA) -platelets 100 -plan is to restart Eliquis  -For now, will place on Heparin  AKI on chronic kidney disease, stage III -Baseline creatinine approximately 0.9 -Upon admission, creatinine 1.94, currently 1.13 -Continue to monitor BMP  Bilateral pleural effusions -Noted on chest x-ray,  moderate on the left and small moderate on the right. -Likely secondary to IV fluids  Pulmonary hypertension -PA peak pressure 59  Acute encephalopathy -Secondary to multifactorial causes including pneumonia, shock, hypoxia -Waxing and waning  Hypokalemia/hypophosphatemia -Resolved -Continue to monitor   Coagulopathy -Unclear etiology, possibly sepsis vs liver etiology  -Patient was on Eliquis upon admission, INR >10  Normocytic Anemia, chronic  -Possibly dilutional as patient presented with septic shock and received fluids -hemoglobin has been between 7 and 8 during hospitalization, however September 09, 2015 Baseline appears to be 10-11 -hemoglobin currently 8.5  Thrombocytopenia -Platelet currently 100 -Possibly due to sepsis  Hyperglycemia -Continue insulin fine scale CBG monitoring -no history of diabetes  Small amount of pelvic free fluid -question abdominal source with sepsis  Elevated LFTs -Trending downward -hepatitis panel negative -Continue to monitor   Severe malnutrition -nutrition consulted: Underweight, due to inadequate oral intake related to inability to eat as evidenced by NPO status. RD will monitor for GOC and provide nutrition-related interventions as warranted  Goals of care -Palliative care consulted and appreciated -Given patient's deterioration, patient will be transitioned to comfort care today.  Deconditioning   The results of significant diagnostics from this hospitalization (including imaging, microbiology, ancillary and laboratory) are listed below for reference.    Significant Diagnostic Studies: Ct Abdomen Pelvis Wo Contrast  Result Date: 04/29/2016 CLINICAL DATA:  Altered mental status, weakness, bilateral pleural effusions, elevated liver function tests. EXAM: CT CHEST, ABDOMEN AND PELVIS WITHOUT CONTRAST TECHNIQUE: Multidetector CT imaging of the chest, abdomen and pelvis was performed following the standard protocol without IV  contrast. COMPARISON:  Chest radiograph 04/29/2016 FINDINGS: CT CHEST FINDINGS Cardiovascular: Enlarged heart. No evidence of pericardial effusion. Biventricular pacemaker in place. Heavy calcific atherosclerotic disease of the coronary arteries and thoracic aorta. Post CABG. Mediastinum/Nodes: No enlarged  mediastinal, hilar, or axillary lymph nodes. Thyroid gland, trachea, and esophagus demonstrate no significant findings. Lungs/Pleura: Advanced centrilobular emphysema. Mild hyperinflation. Atelectatic changes in the dependent portions of all lobes of the lungs. More confluent airspace opacity in the medial portion of the right middle lobe. Mild interstitial pulmonary edema. Small to moderate in size bilateral pleural effusions. Musculoskeletal: No chest wall mass or suspicious bone lesions identified. CT ABDOMEN PELVIS FINDINGS Hepatobiliary: No focal liver abnormality is seen. No gallstones, gallbladder wall thickening, or biliary dilatation. Pancreas: Unremarkable. No pancreatic ductal dilatation or surrounding inflammatory changes. Spleen: Normal in size without focal abnormality. Adrenals/Urinary Tract: Adrenal glands are unremarkable. Kidneys are without renal calculi, or hydronephrosis. Hypoattenuated 1.6 cm lesion within the superior pole of the left kidney measures water density and therefore likely represents a cyst. Bladder is unremarkable. Stomach/Bowel: Stomach is within normal limits. No evidence of bowel wall thickening, distention, or inflammatory changes. Vascular/Lymphatic: Aortic atherosclerosis. No enlarged abdominal or pelvic lymph nodes. Reproductive: Status post hysterectomy. No adnexal masses. Other: Small amount of free fluid in the pelvis. Generalized mesenteric stranding in the abdomen and pelvis. Musculoskeletal: No suspicious osseous lesions. S shaped spinal scoliosis with associated moderate in severity osteoarthritic changes. IMPRESSION: Enlarged heart. Advanced calcific  atherosclerotic disease of the coronary arteries and aorta. Advanced centrilobular emphysema and hyperinflation, consistent with COPD. Superimposed mild interstitial pulmonary edema. Moderate in size bilateral simple appearing pleural effusions. Widespread atelectatic changes in the dependent portions of the lungs. More confluent airspace opacity in the right middle lobe may represent round atelectasis/airspace consolidation or less likely pulmonary mass. Attention on follow-up is recommended. Small amount of free pelvic fluid and generalized mesenteric stranding of the abdomen. No significant abnormalities within the solid abdominal organs noted on this noncontrasted CT. Electronically Signed   By: Ted Mcalpine M.D.   On: 04/29/2016 15:43   Dg Chest 2 View  Result Date: 04/29/2016 CLINICAL DATA:  Weakness and confusion for 3 days. Cough and chest congestion. COPD. EXAM: CHEST  2 VIEW COMPARISON:  11/24/2015 FINDINGS: Moderate enlargement of the cardiopericardial silhouette with bilateral pleural effusions and passive atelectasis. The pleural effusions are mildly increased in size from 11/24/2015. Atherosclerotic calcification of the aortic arch. Prior CABG. Single lead pacer noted. Cervical plate and screw fixator. Suspected underlying emphysema.  No overt edema currently. Mild chronic mid thoracic wedging. Sternal manubrial deformity, chronic. IMPRESSION: 1. Small to moderate-sized bilateral pleural effusions, increased from prior, with associated passive atelectasis. 2. Moderate enlargement of the cardiopericardial silhouette, without overt edema. 3. Prior CABG. 4.  Atherosclerotic calcification of the aortic arch. 5. Emphysema. Electronically Signed   By: Gaylyn Rong M.D.   On: 04/29/2016 11:45   Ct Head Wo Contrast  Result Date: 04/29/2016 CLINICAL DATA:  Altered mental status EXAM: CT HEAD WITHOUT CONTRAST TECHNIQUE: Contiguous axial images were obtained from the base of the skull through  the vertex without intravenous contrast. COMPARISON:  04/05/2015 FINDINGS: Brain: Old left frontal infarct. Mild chronic microvascular disease throughout the deep white matter. No acute intracranial abnormality. Specifically, no hemorrhage, hydrocephalus, mass lesion, acute infarction, or significant intracranial injury. Vascular: No hyperdense vessel or unexpected calcification. Skull: No acute calvarial abnormality. Sinuses/Orbits: Visualized paranasal sinuses and mastoids clear. Orbital soft tissues unremarkable. Other: None IMPRESSION: Old left frontal infarct. Mild chronic small vessel disease. No acute intracranial abnormality. Electronically Signed   By: Charlett Nose M.D.   On: 04/29/2016 12:01   Ct Chest Wo Contrast  Result Date: 04/29/2016 CLINICAL DATA:  Altered mental  status, weakness, bilateral pleural effusions, elevated liver function tests. EXAM: CT CHEST, ABDOMEN AND PELVIS WITHOUT CONTRAST TECHNIQUE: Multidetector CT imaging of the chest, abdomen and pelvis was performed following the standard protocol without IV contrast. COMPARISON:  Chest radiograph 04/29/2016 FINDINGS: CT CHEST FINDINGS Cardiovascular: Enlarged heart. No evidence of pericardial effusion. Biventricular pacemaker in place. Heavy calcific atherosclerotic disease of the coronary arteries and thoracic aorta. Post CABG. Mediastinum/Nodes: No enlarged mediastinal, hilar, or axillary lymph nodes. Thyroid gland, trachea, and esophagus demonstrate no significant findings. Lungs/Pleura: Advanced centrilobular emphysema. Mild hyperinflation. Atelectatic changes in the dependent portions of all lobes of the lungs. More confluent airspace opacity in the medial portion of the right middle lobe. Mild interstitial pulmonary edema. Small to moderate in size bilateral pleural effusions. Musculoskeletal: No chest wall mass or suspicious bone lesions identified. CT ABDOMEN PELVIS FINDINGS Hepatobiliary: No focal liver abnormality is seen. No  gallstones, gallbladder wall thickening, or biliary dilatation. Pancreas: Unremarkable. No pancreatic ductal dilatation or surrounding inflammatory changes. Spleen: Normal in size without focal abnormality. Adrenals/Urinary Tract: Adrenal glands are unremarkable. Kidneys are without renal calculi, or hydronephrosis. Hypoattenuated 1.6 cm lesion within the superior pole of the left kidney measures water density and therefore likely represents a cyst. Bladder is unremarkable. Stomach/Bowel: Stomach is within normal limits. No evidence of bowel wall thickening, distention, or inflammatory changes. Vascular/Lymphatic: Aortic atherosclerosis. No enlarged abdominal or pelvic lymph nodes. Reproductive: Status post hysterectomy. No adnexal masses. Other: Small amount of free fluid in the pelvis. Generalized mesenteric stranding in the abdomen and pelvis. Musculoskeletal: No suspicious osseous lesions. S shaped spinal scoliosis with associated moderate in severity osteoarthritic changes. IMPRESSION: Enlarged heart. Advanced calcific atherosclerotic disease of the coronary arteries and aorta. Advanced centrilobular emphysema and hyperinflation, consistent with COPD. Superimposed mild interstitial pulmonary edema. Moderate in size bilateral simple appearing pleural effusions. Widespread atelectatic changes in the dependent portions of the lungs. More confluent airspace opacity in the right middle lobe may represent round atelectasis/airspace consolidation or less likely pulmonary mass. Attention on follow-up is recommended. Small amount of free pelvic fluid and generalized mesenteric stranding of the abdomen. No significant abnormalities within the solid abdominal organs noted on this noncontrasted CT. Electronically Signed   By: Ted Mcalpine M.D.   On: 04/29/2016 15:43   Dg Chest Port 1 View  Result Date: 05/08/2016 CLINICAL DATA:  Acute respiratory failure EXAM: PORTABLE CHEST 1 VIEW COMPARISON:  05/06/2016,  05/05/2016 FINDINGS: Partially visualized cervical spine hardware. Postsurgical changes of the mediastinum. Left-sided single lead pacing device with lead over the right ventricle as before. Moderate left pleural effusion. Small moderate right pleural effusion. Hazy edema or infiltrates bilaterally, increased compared to prior. Stable cardiomegaly. Atherosclerosis. No pneumothorax. IMPRESSION: 1. Bilateral pleural effusions, moderate on the left and small moderate on the right. 2. Cardiomegaly with central vascular congestion. Increased hazy bilateral pulmonary edema an or infiltrates since the prior study. Electronically Signed   By: Jasmine Pang M.D.   On: 05/08/2016 03:52   Dg Chest Port 1 View  Result Date: 05/06/2016 CLINICAL DATA:  76 year old female with chronic pleural effusions and cardiomyopathy. Possible Sepsis. Initial encounter. EXAM: PORTABLE CHEST 1 VIEW COMPARISON:  05/05/2016 and earlier. FINDINGS: Stable left chest single lead cardiac pacemaker. Stable cardiomegaly and mediastinal contours. Sequelae of CABG. Calcified aortic atherosclerosis. Bilateral veiling opacity compatible with chronic pleural effusions. Left side effusion appears increased since 04/29/2016 and moderate. Superimposed perihilar atelectasis. Interval increased pulmonary vascular congestion. No pneumothorax. Stable visualized osseous structures. Prior median sternotomy and  cervical ACDF. IMPRESSION: 1. Interval increased pulmonary vascular congestion suspicious for acute interstitial edema. 2. Chronic pleural effusions, that on the left appears progressed since 04/29/2016 and now moderate. Increased atelectasis. 3. Chronic cardiomegaly.  Calcified aortic atherosclerosis. Electronically Signed   By: Odessa FlemingH  Hall M.D.   On: 05/06/2016 07:29   Dg Chest Port 1 View  Result Date: 05/05/2016 CLINICAL DATA:  Pneumonia. EXAM: PORTABLE CHEST 1 VIEW COMPARISON:  05/14/2016 . FINDINGS: Cardiac pacer stable position. Prior CABG.  Cardiomegaly. Diffuse progressive bilateral pulmonary infiltrates and bilateral pleural effusions are noted consistent congestive heart failure. Bilateral pneumonia cannot be excluded. Prior cervical spine fusion. IMPRESSION: 1.  Cardiac pacer stable position.  Prior CABG. 2. Cardiomegaly with progressive bilateral pulmonary infiltrates/edema and bilateral pleural effusions consistent with congestive heart failure. Bilateral pneumonia cannot be excluded. Electronically Signed   By: Maisie Fushomas  Register   On: 05/05/2016 07:22   Dg Chest Port 1 View  Result Date: 05/07/2016 CLINICAL DATA:  Pneumonia diagnosed 5 days ago, persists symptoms. Shortness of breath and weakness. Former smoker. History of COPD and atrial fibrillation. EXAM: PORTABLE CHEST 1 VIEW COMPARISON:  Chest x-ray and chest CT scan of April 30, 2015 2018. FINDINGS: The lungs are reasonably well inflated. There are bilateral pleural effusions which have increased in volume. The cardiac silhouette is enlarged. The pulmonary vascularity is engorged the retrocardiac region on the left demonstrates increased density. There is calcification in the wall of the aortic arch. The patient has undergone previous CABG. The ICD is in stable position. The bony thorax exhibits no acute abnormality. IMPRESSION: CHF with pulmonary interstitial edema and increased pleural effusion volume. Underlying COPD. Retrocardiac density is more conspicuous and may reflect atelectasis or pneumonia. Electronically Signed   By: David  SwazilandJordan M.D.   On: 05/16/2016 15:07    Microbiology: Recent Results (from the past 240 hour(s))  Urine culture     Status: Abnormal   Collection Time: 05/16/2016  2:22 PM  Result Value Ref Range Status   Specimen Description URINE, CLEAN CATCH  Final   Special Requests NONE  Final   Culture MULTIPLE SPECIES PRESENT, SUGGEST RECOLLECTION (A)  Final   Report Status 05/06/2016 FINAL  Final  Blood Culture (routine x 2)     Status: None    Collection Time: 05/16/2016  2:55 PM  Result Value Ref Range Status   Specimen Description BLOOD LEFT ANTECUBITAL  Final   Special Requests AEROBIC BOTTLE ONLY 5CC  Final   Culture   Final    NO GROWTH 5 DAYS Performed at [facility], [address] [place], [place] [address]    Report Status 05/09/2016 FINAL  Final  Blood Culture (routine x 2)     Status: None   Collection Time: 05/27/2016  2:58 PM  Result Value Ref Range Status   Specimen Description BLOOD RIGHT ANTECUBITAL  Final   Special Requests BOTTLES DRAWN AEROBIC AND ANAEROBIC 5CC  Final   Culture   Final    NO GROWTH 5 DAYS Performed at [facility], [address] [place], [place] [address]    Report Status 05/09/2016 FINAL  Final  MRSA PCR Screening     Status: None   Collection Time: 05/08/16 10:53 AM  Result Value Ref Range Status   MRSA by PCR NEGATIVE NEGATIVE Final    Comment:        The GeneXpert MRSA Assay (FDA approved for NASAL specimens only), is one component of a comprehensive MRSA colonization  surveillance program. It is not intended to diagnose MRSA infection nor to guide or monitor treatment for MRSA infections.      Labs: Basic Metabolic Panel:  Recent Labs Lab 05/05/16 0414 05/06/16 0900 05/07/16 0405 05/07/16 1823 05/08/16 0354 05/09/16 0343 05/04/2016 0414  NA 133* 136 139 142 142 141 142  K 5.9* 3.5 2.8* 3.8 3.5 3.5 3.8  CL 99* 99* 103 105 106 104 105  CO2 21* 24 26 28 28 31 31   GLUCOSE 121* 137* 116* 185* 123* 137* 123*  BUN 50* 51* 42* 33* 31* 29* 27*  CREATININE 1.91* 1.62* 1.16* 1.14* 0.98 1.04* 1.13*  CALCIUM 6.7* 6.8* 7.4* 8.0* 8.4* 8.4* 8.7*  MG 2.1 1.9  --  1.9 2.1 1.9  --   PHOS 5.5* 3.2  --   --   --  2.5  --    Liver Function Tests:  Recent Labs Lab 2016-05-07 1455 05/06/16 0900 05/07/16 0405 05/26/2016 0414  AST 1,212* 577* 347* 73*  ALT 1,464* 907* 712* 352*  ALKPHOS 120 117 120 107  BILITOT 3.5* 4.0* 3.9* 2.2*  PROT 5.4* 6.0* 5.6* 5.8*  ALBUMIN  3.2* 3.5 3.3* 3.5    Recent Labs Lab 2016-05-07 1455  LIPASE 114*  AMYLASE 156*    Recent Labs Lab 05/05/16 1201  AMMONIA 32   CBC:  Recent Labs Lab 05-07-2016 1455  05/06/16 0900 05/07/16 0405 05/08/16 0354 05/09/16 0343 05/28/2016 0414  WBC 10.0  < > 10.7* 8.5 7.5 7.7 8.5  NEUTROABS 8.8*  --   --   --   --   --   --   HGB 7.5*  < > 8.5* 8.3* 8.1* 7.6* 8.5*  HCT 27.3*  < > 27.8* 28.6* 27.8* 26.5* 31.4*  MCV 82.5  < > 81.8 81.0 84.0 86.0 86.3  PLT 300  < > 134* 91* 93* 95* 100*  < > = values in this interval not displayed. Cardiac Enzymes:  Recent Labs Lab May 07, 2016 1455  TROPONINI 0.04*   D-Dimer No results for input(s): DDIMER in the last 72 hours. BNP: Invalid input(s): POCBNP CBG:  Recent Labs Lab 05/09/16 1547 05/09/16 1930 05/09/16 2307 05/11/2016 0251 05/25/2016 0744  GLUCAP 127* 149* 124* 133* 113*   Anemia work up No results for input(s): VITAMINB12, FOLATE, FERRITIN, TIBC, IRON, RETICCTPCT in the last 72 hours. Urinalysis    Component Value Date/Time   COLORURINE AMBER (A) 05/07/2016 1701   APPEARANCEUR HAZY (A) 2016-05-07 1701   LABSPEC 1.018 2016-05-07 1701   PHURINE 5.0 May 07, 2016 1701   GLUCOSEU NEGATIVE May 07, 2016 1701   HGBUR NEGATIVE 05/07/16 1701   BILIRUBINUR NEGATIVE May 07, 2016 1701   KETONESUR 5 (A) 05/07/2016 1701   PROTEINUR 100 (A) 2016-05-07 1701   UROBILINOGEN 0.2 11/03/2014 2115   NITRITE NEGATIVE 05/07/2016 1701   LEUKOCYTESUR NEGATIVE 2016/05/07 1701   Sepsis Labs Invalid input(s): PROCALCITONIN,  WBC,  LACTICIDVEN     SIGNED:  Edsel Petrin, MD  [facility] 05/29/2016, 3:39 PM Pager   If 7PM-7AM, please contact night-coverage [email or web address] Password [email or web address]

## 2016-06-01 NOTE — Progress Notes (Signed)
OT Cancellation Note  Patient Details Name: Brenda RinksRobina B Glass MRN: [id number] DOB: 03/15/1941   Cancelled Treatment:    Reason Eval/Treat Not Completed: Other (comment).  Noted that pt is full comfort care and appears to be actively dying per palliative care.  Will sign off.  Majour Frei 05/25/2016, 10:59 AM  Marica OtterMaryellen Oshay Stranahan, OTR/L [phone] 05/06/2016

## 2016-06-01 NOTE — Progress Notes (Signed)
   08/12/16 0900  Clinical Encounter Type  Visited With Patient and family together  Visit Type Initial;Psychological support;Spiritual support;Critical Care  Referral From Nurse  Consult/Referral To Chaplain  Spiritual Encounters  Spiritual Needs Prayer;Emotional;Other (Comment) (Pastoral Conversation/Support)  Stress Factors  Patient Stress Factors None identified  Family Stress Factors Major life changes;Health changes;Other (Comment) (Grief)   I visited with the patient and her daughter per referral by the nurse. The patient was not awake during my visit.  The daughter was at the bedside and tearful. She understands the patient's condition and wants to make sure that she is comfortable.  I provided the patient with a prayer shawl and provided empathetic listening to the patient's daughter. Mrs. Brenda BoerSelf's daughter is going through breast cancer and is scheduled to have surgery next week to remove a lump. She states that she has good support, but that the patient's family is primarily in [place] is from.  The daughter requested prayer and we prayed at the bedside. I will continue to follow up with the patient and her daughter.   Please, contact Spiritual Care for further assistance.   Chaplain Clint BolderBrittany Cortasia Screws M.Div.

## 2016-06-01 DEATH — deceased

## 2017-06-26 IMAGING — CT CT HEAD W/O CM
3 of 4 series · 14 of 47 positions shown, 16 images · non-contrast
Comparison: 04/05/2015

CLINICAL DATA: Altered mental status

EXAM:
CT HEAD WITHOUT CONTRAST
TECHNIQUE: Contiguous axial images were obtained from the base of the skull
through the vertex without intravenous contrast.

[Series 2: head w/o · axial · non-contrast · 0.45mm/px · z∈[-192,-82]mm · 8 of 28 slices shown, 10 images]
[im 3/28  brain]
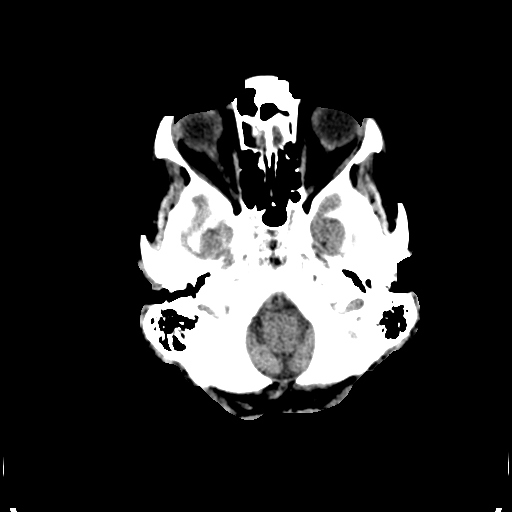
[im 3/28  bone]
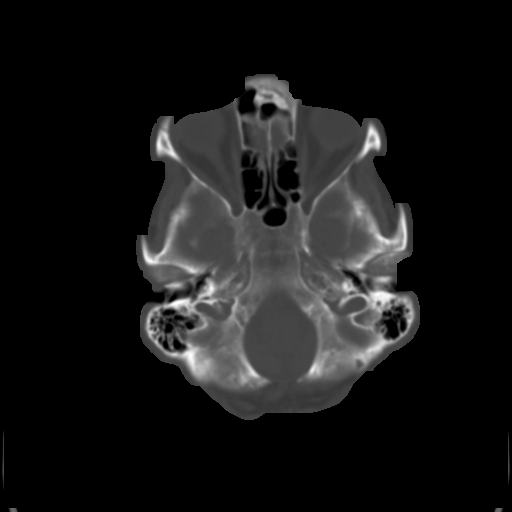
[im 6/28  brain]
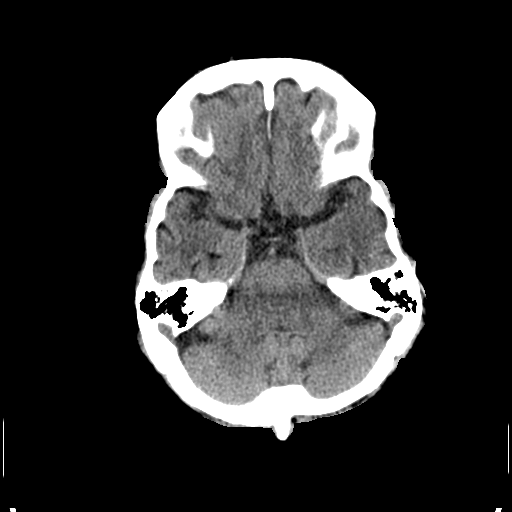
[im 9/28  brain]
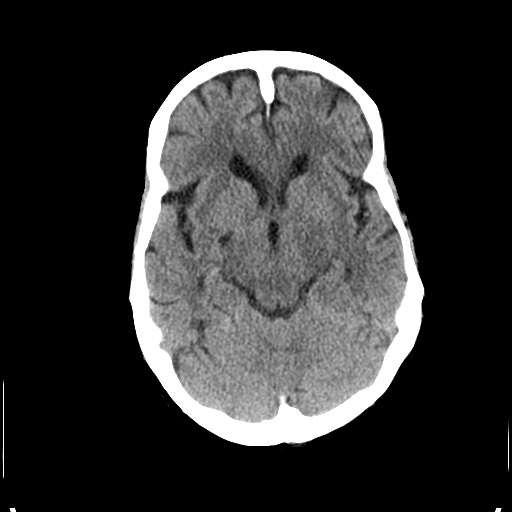
[im 11/28  brain]
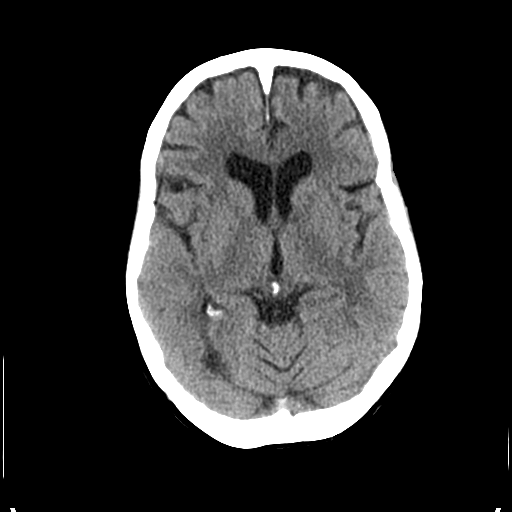
[im 17/28  brain]
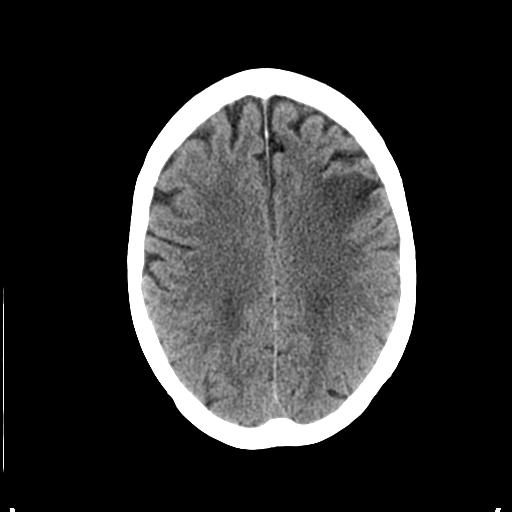
[im 17/28  bone]
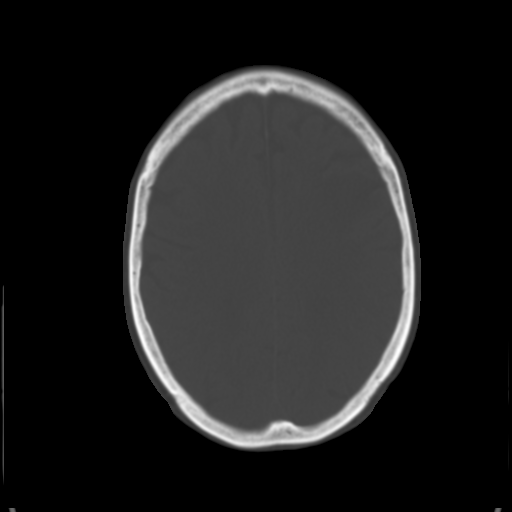
[im 19/28  brain]
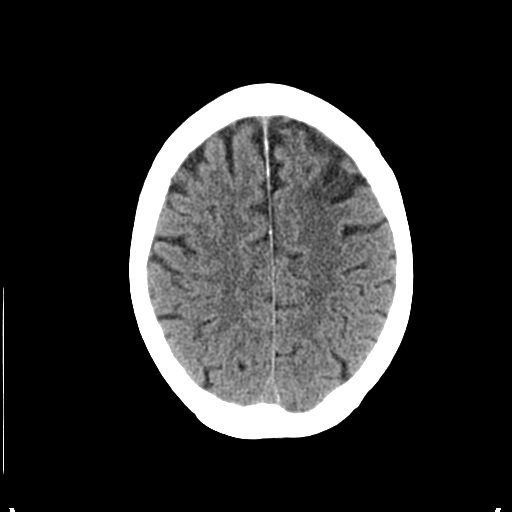
[im 22/28  brain]
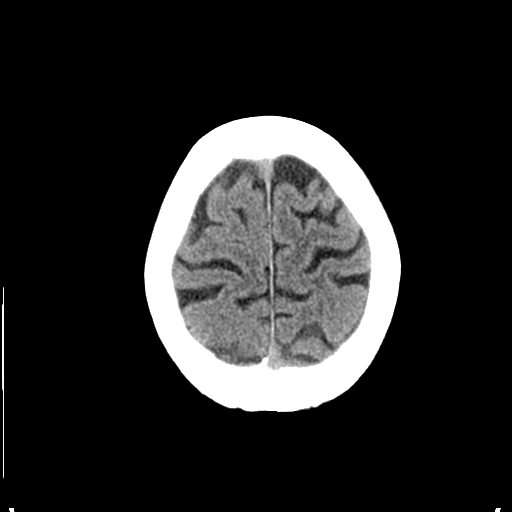
[im 25/28  brain]
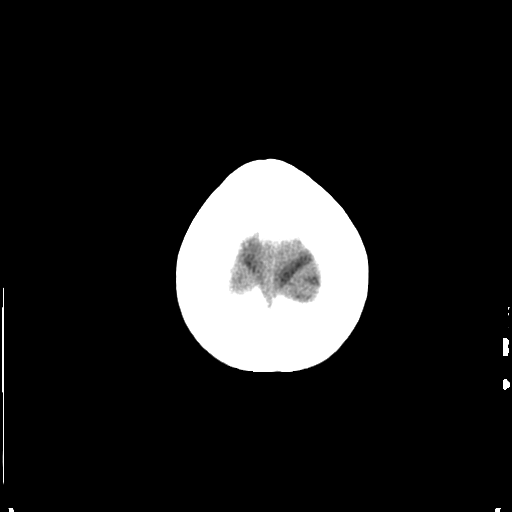

[Series 4: coronal · coronal · 0.27mm/px · 3 of 71 slices shown]
[im 24/71  brain]
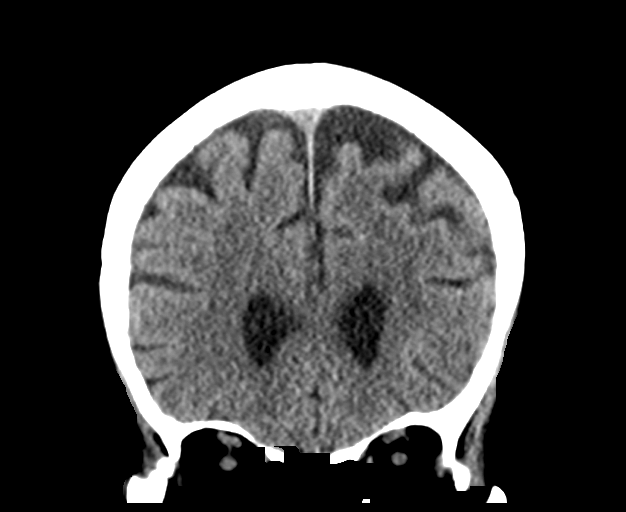
[im 32/71  brain]
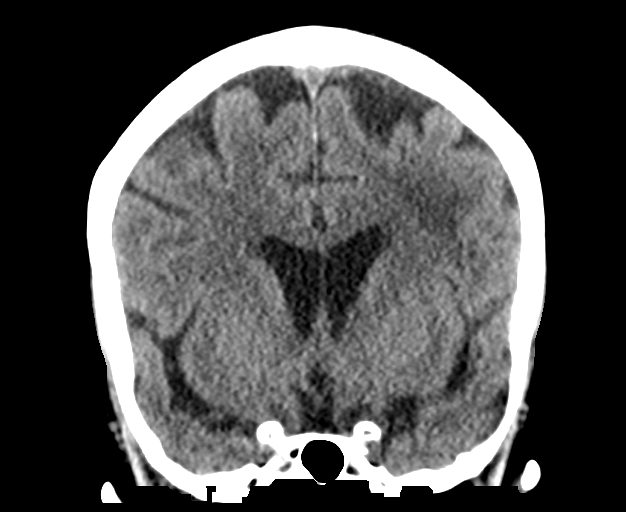
[im 39/71  brain]
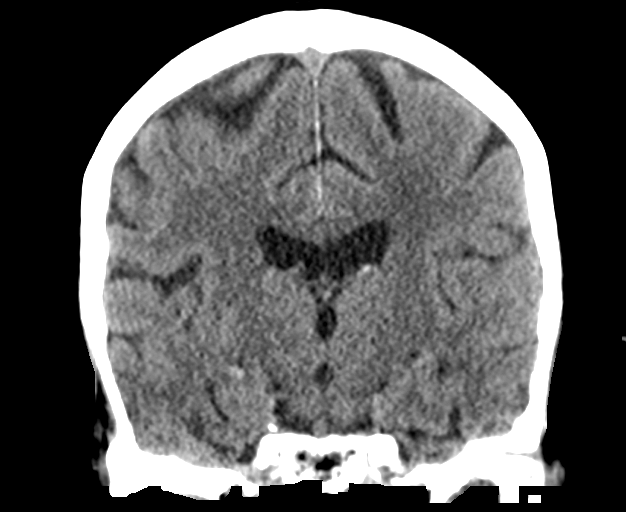

[Series 5: sagittal · sagittal · 0.27mm/px · 3 of 53 slices shown]
[im 18/53  brain]
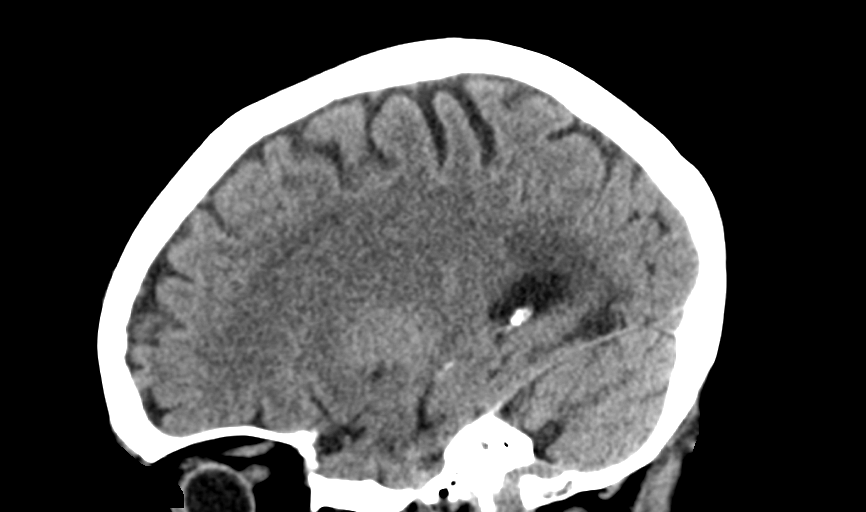
[im 27/53  brain]
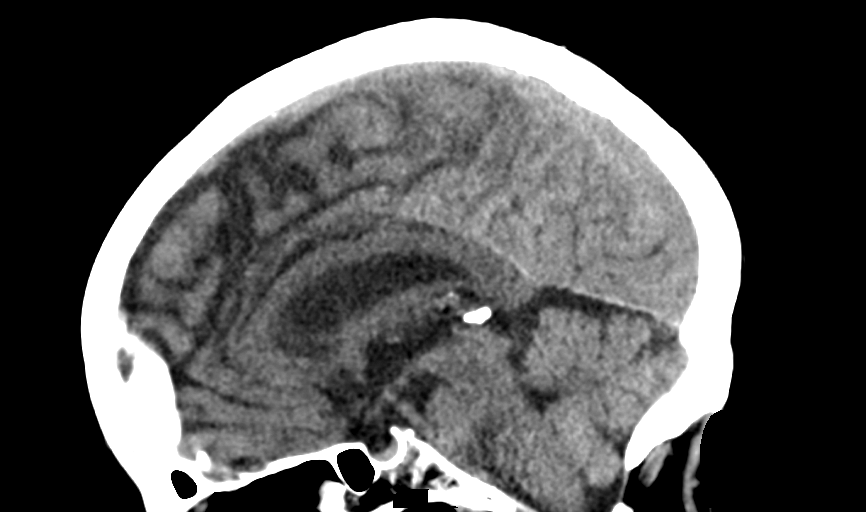
[im 35/53  brain]
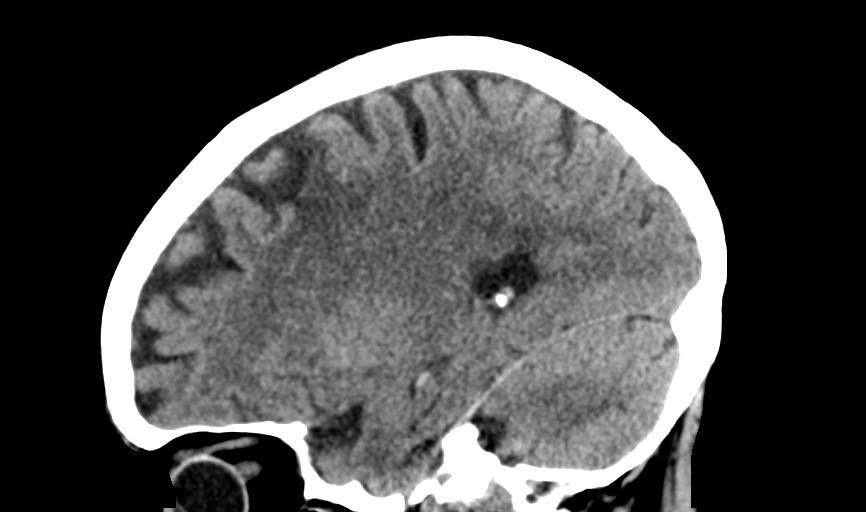

[14 of 47 positions shown; findings below may reference images not displayed]

FINDINGS: Brain: Old left frontal infarct. Mild chronic microvascular disease
throughout the deep white matter. No acute intracranial abnormality.
Specifically, no hemorrhage, hydrocephalus, mass lesion, acute
infarction, or significant intracranial injury.

Vascular: No hyperdense vessel or unexpected calcification.

Skull: No acute calvarial abnormality.

Sinuses/Orbits: Visualized paranasal sinuses and mastoids clear.
Orbital soft tissues unremarkable.

Other: None
IMPRESSION: Old left frontal infarct.

Mild chronic small vessel disease.

No acute intracranial abnormality.

## 2017-06-26 IMAGING — CR DG CHEST 2V
2 series · 2 of 2 positions shown · non-contrast
Comparison: 11/24/2015

CLINICAL DATA: Weakness and confusion for 3 days. Cough and chest
congestion. COPD.

EXAM:
CHEST  2 VIEW

[w chest lat]
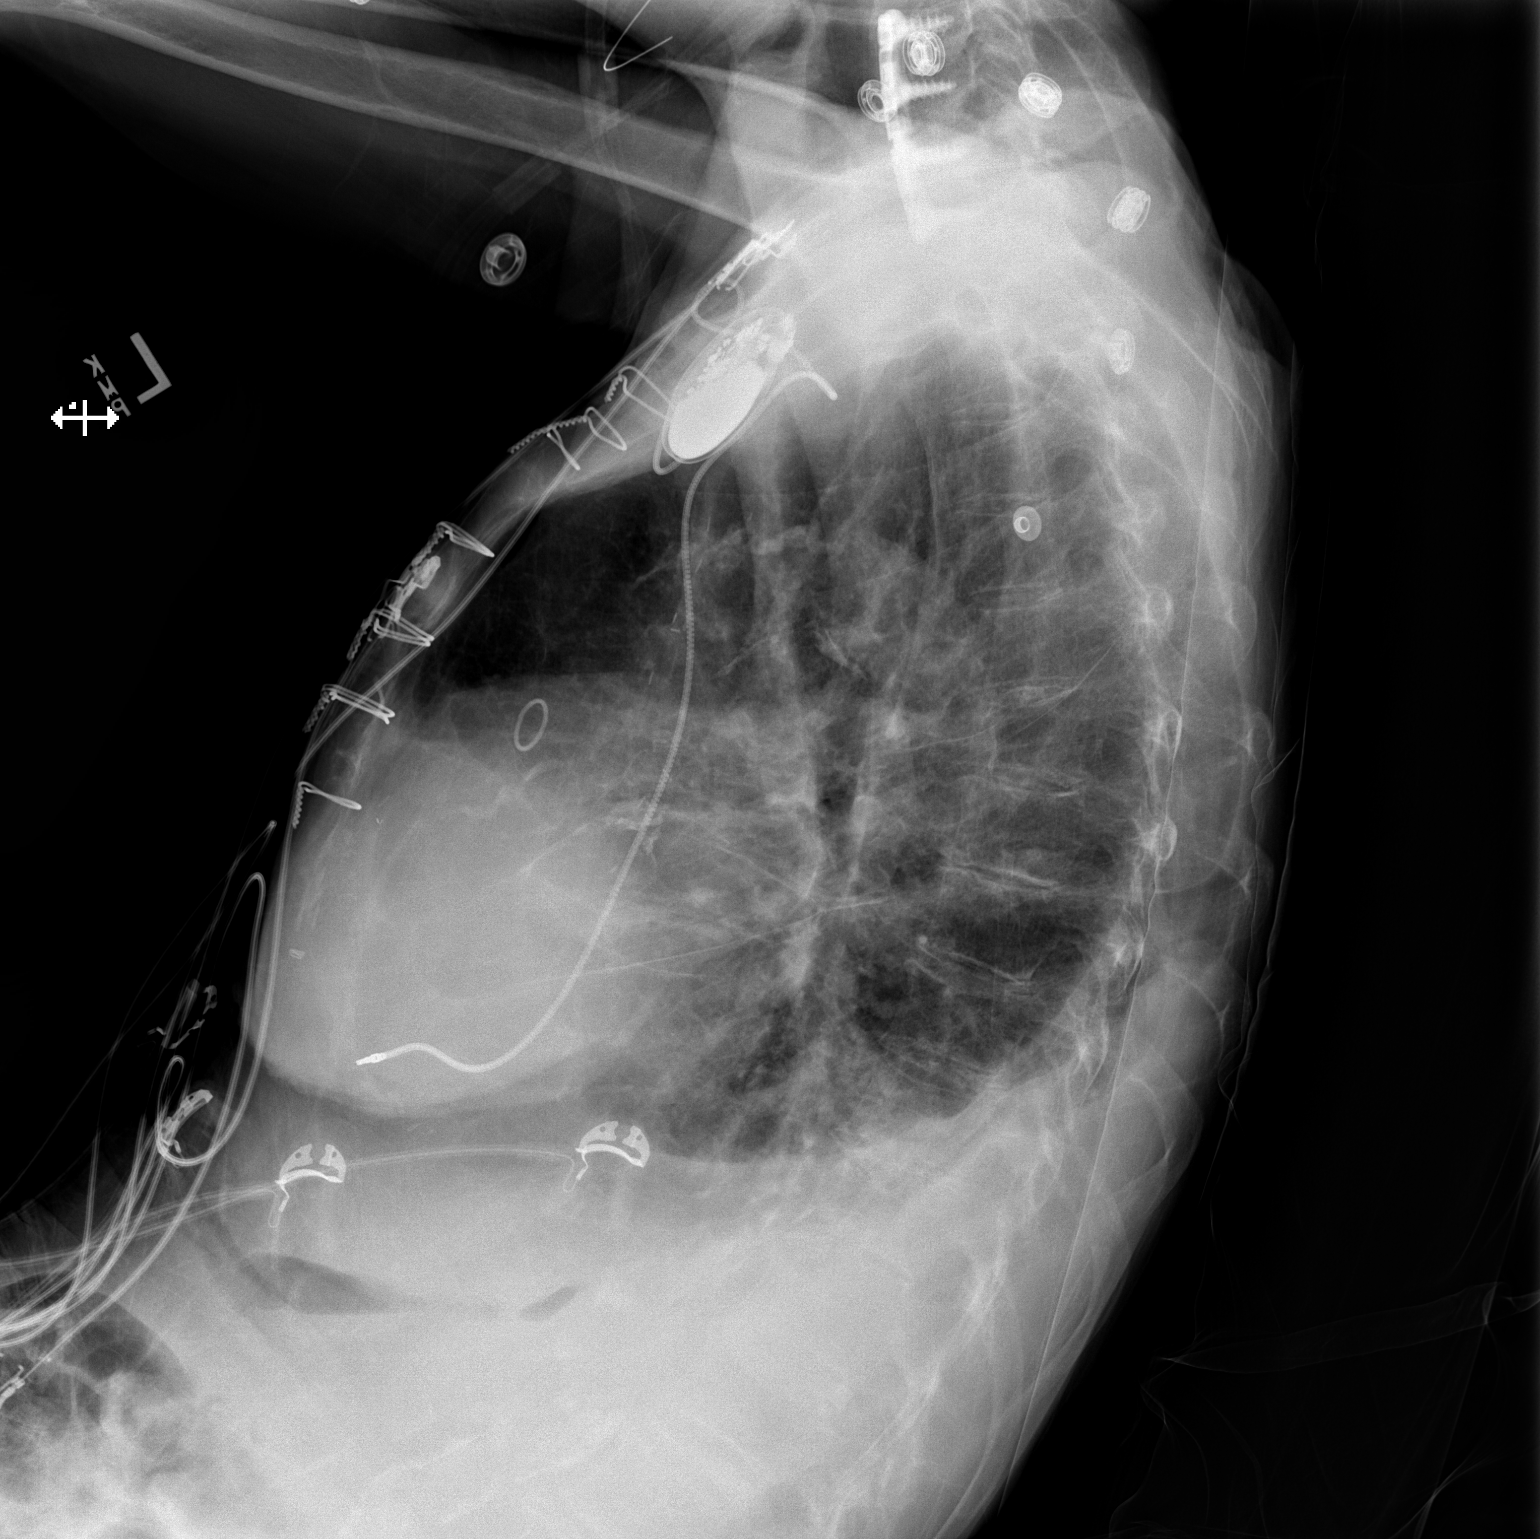

[x chest ap]
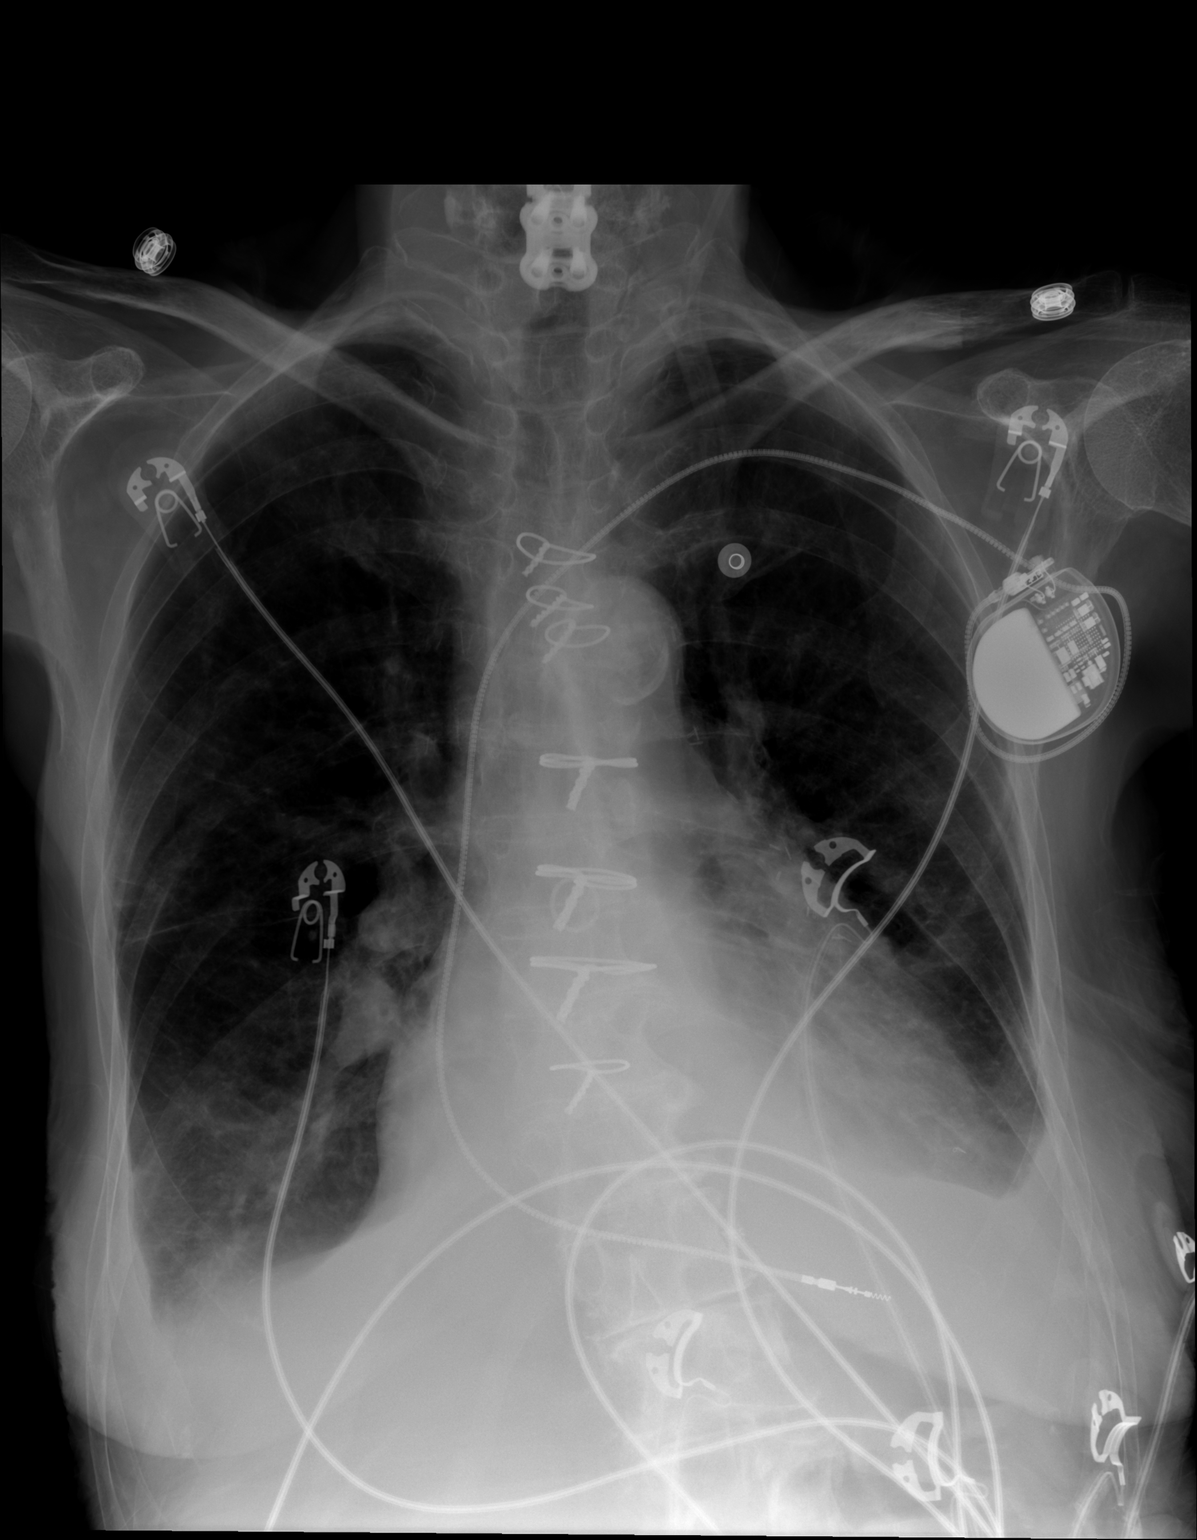

[2 of 2 positions shown; findings below may reference images not displayed]

FINDINGS: Moderate enlargement of the cardiopericardial silhouette with
bilateral pleural effusions and passive atelectasis. The pleural
effusions are mildly increased in size from 11/24/2015.

Atherosclerotic calcification of the aortic arch. Prior CABG. Single
lead pacer noted. Cervical plate and screw fixator.

Suspected underlying emphysema.  No overt edema currently.

Mild chronic mid thoracic wedging. Sternal manubrial deformity,
chronic.
IMPRESSION: 1. Small to moderate-sized bilateral pleural effusions, increased
from prior, with associated passive atelectasis.
2. Moderate enlargement of the cardiopericardial silhouette, without
overt edema.
3. Prior CABG.
4.  Atherosclerotic calcification of the aortic arch.
5. Emphysema.
